# Patient Record
Sex: Female | Born: 1935
Health system: Southern US, Community
[De-identification: ages and names within clinical notes are randomized; demographics above are authoritative.]

## PROBLEM LIST (undated history)

## (undated) DIAGNOSIS — E039 Hypothyroidism, unspecified: Secondary | ICD-10-CM

## (undated) DIAGNOSIS — K219 Gastro-esophageal reflux disease without esophagitis: Secondary | ICD-10-CM

## (undated) DIAGNOSIS — R0600 Dyspnea, unspecified: Secondary | ICD-10-CM

## (undated) DIAGNOSIS — E785 Hyperlipidemia, unspecified: Secondary | ICD-10-CM

## (undated) DIAGNOSIS — R269 Unspecified abnormalities of gait and mobility: Secondary | ICD-10-CM

## (undated) DIAGNOSIS — F0390 Unspecified dementia without behavioral disturbance: Secondary | ICD-10-CM

## (undated) DIAGNOSIS — Z8711 Personal history of peptic ulcer disease: Secondary | ICD-10-CM

## (undated) DIAGNOSIS — M47819 Spondylosis without myelopathy or radiculopathy, site unspecified: Secondary | ICD-10-CM

## (undated) DIAGNOSIS — G4733 Obstructive sleep apnea (adult) (pediatric): Secondary | ICD-10-CM

## (undated) DIAGNOSIS — Z87442 Personal history of urinary calculi: Secondary | ICD-10-CM

## (undated) DIAGNOSIS — M353 Polymyalgia rheumatica: Secondary | ICD-10-CM

## (undated) DIAGNOSIS — M1990 Unspecified osteoarthritis, unspecified site: Secondary | ICD-10-CM

## (undated) DIAGNOSIS — R0609 Other forms of dyspnea: Secondary | ICD-10-CM

## (undated) DIAGNOSIS — Z9889 Other specified postprocedural states: Secondary | ICD-10-CM

## (undated) DIAGNOSIS — I452 Bifascicular block: Secondary | ICD-10-CM

## (undated) DIAGNOSIS — Z8673 Personal history of transient ischemic attack (TIA), and cerebral infarction without residual deficits: Secondary | ICD-10-CM

## (undated) DIAGNOSIS — I639 Cerebral infarction, unspecified: Secondary | ICD-10-CM

## (undated) DIAGNOSIS — Z8719 Personal history of other diseases of the digestive system: Secondary | ICD-10-CM

## (undated) DIAGNOSIS — N3941 Urge incontinence: Secondary | ICD-10-CM

## (undated) DIAGNOSIS — E119 Type 2 diabetes mellitus without complications: Secondary | ICD-10-CM

## (undated) DIAGNOSIS — Z859 Personal history of malignant neoplasm, unspecified: Secondary | ICD-10-CM

## (undated) DIAGNOSIS — I10 Essential (primary) hypertension: Secondary | ICD-10-CM

## (undated) HISTORY — DX: Hypothyroidism, unspecified: E03.9

## (undated) HISTORY — DX: Hyperlipidemia, unspecified: E78.5

## (undated) HISTORY — DX: Essential (primary) hypertension: I10

## (undated) HISTORY — PX: APPENDECTOMY: SHX54

## (undated) HISTORY — PX: TONSILLECTOMY: SUR1361

## (undated) HISTORY — DX: Obstructive sleep apnea (adult) (pediatric): G47.33

---
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
## 1997-05-06 ENCOUNTER — Ambulatory Visit (HOSPITAL_COMMUNITY): Admission: RE | Admit: 1997-05-06 | Discharge: 1997-05-06 | Payer: Self-pay | Admitting: Obstetrics and Gynecology

## 1997-07-29 ENCOUNTER — Other Ambulatory Visit: Admission: RE | Admit: 1997-07-29 | Discharge: 1997-07-29 | Payer: Self-pay | Admitting: Obstetrics and Gynecology

## 1998-06-20 ENCOUNTER — Ambulatory Visit (HOSPITAL_COMMUNITY): Admission: RE | Admit: 1998-06-20 | Discharge: 1998-06-20 | Payer: Self-pay | Admitting: Obstetrics and Gynecology

## 1998-08-22 ENCOUNTER — Other Ambulatory Visit: Admission: RE | Admit: 1998-08-22 | Discharge: 1998-08-22 | Payer: Self-pay | Admitting: Obstetrics and Gynecology

## 1998-12-15 ENCOUNTER — Encounter (HOSPITAL_BASED_OUTPATIENT_CLINIC_OR_DEPARTMENT_OTHER): Payer: Self-pay | Admitting: Internal Medicine

## 1998-12-15 ENCOUNTER — Encounter: Admission: RE | Admit: 1998-12-15 | Discharge: 1998-12-15 | Payer: Self-pay | Admitting: Internal Medicine

## 1999-07-03 ENCOUNTER — Encounter: Payer: Self-pay | Admitting: Obstetrics and Gynecology

## 1999-07-03 ENCOUNTER — Ambulatory Visit (HOSPITAL_COMMUNITY): Admission: RE | Admit: 1999-07-03 | Discharge: 1999-07-03 | Payer: Self-pay | Admitting: Obstetrics and Gynecology

## 1999-08-03 ENCOUNTER — Other Ambulatory Visit: Admission: RE | Admit: 1999-08-03 | Discharge: 1999-08-03 | Payer: Self-pay | Admitting: Obstetrics and Gynecology

## 2000-07-11 ENCOUNTER — Encounter: Payer: Self-pay | Admitting: Obstetrics and Gynecology

## 2000-07-11 ENCOUNTER — Ambulatory Visit (HOSPITAL_COMMUNITY): Admission: RE | Admit: 2000-07-11 | Discharge: 2000-07-11 | Payer: Self-pay | Admitting: Obstetrics and Gynecology

## 2000-08-22 ENCOUNTER — Other Ambulatory Visit: Admission: RE | Admit: 2000-08-22 | Discharge: 2000-08-22 | Payer: Self-pay | Admitting: Obstetrics and Gynecology

## 2001-09-07 ENCOUNTER — Encounter: Payer: Self-pay | Admitting: Obstetrics and Gynecology

## 2001-09-07 ENCOUNTER — Ambulatory Visit (HOSPITAL_COMMUNITY): Admission: RE | Admit: 2001-09-07 | Discharge: 2001-09-07 | Payer: Self-pay | Admitting: Obstetrics and Gynecology

## 2001-10-25 ENCOUNTER — Other Ambulatory Visit: Admission: RE | Admit: 2001-10-25 | Discharge: 2001-10-25 | Payer: Self-pay | Admitting: Obstetrics and Gynecology

## 2001-12-05 ENCOUNTER — Encounter: Admission: RE | Admit: 2001-12-05 | Discharge: 2001-12-05 | Payer: Self-pay | Admitting: Internal Medicine

## 2001-12-05 ENCOUNTER — Encounter: Payer: Self-pay | Admitting: Internal Medicine

## 2002-01-02 ENCOUNTER — Encounter: Payer: Self-pay | Admitting: Urology

## 2002-01-02 ENCOUNTER — Encounter: Admission: RE | Admit: 2002-01-02 | Discharge: 2002-01-02 | Payer: Self-pay | Admitting: Urology

## 2002-01-25 ENCOUNTER — Encounter: Payer: Self-pay | Admitting: Urology

## 2002-01-25 ENCOUNTER — Ambulatory Visit (HOSPITAL_BASED_OUTPATIENT_CLINIC_OR_DEPARTMENT_OTHER): Admission: RE | Admit: 2002-01-25 | Discharge: 2002-01-25 | Payer: Self-pay | Admitting: Urology

## 2002-02-08 ENCOUNTER — Encounter: Admission: RE | Admit: 2002-02-08 | Discharge: 2002-02-08 | Payer: Self-pay | Admitting: Urology

## 2002-02-08 ENCOUNTER — Encounter: Payer: Self-pay | Admitting: Urology

## 2002-05-10 ENCOUNTER — Ambulatory Visit (HOSPITAL_BASED_OUTPATIENT_CLINIC_OR_DEPARTMENT_OTHER): Admission: RE | Admit: 2002-05-10 | Discharge: 2002-05-10 | Payer: Self-pay | Admitting: Urology

## 2002-05-10 ENCOUNTER — Encounter: Payer: Self-pay | Admitting: Urology

## 2002-10-15 ENCOUNTER — Encounter: Payer: Self-pay | Admitting: Internal Medicine

## 2002-10-15 ENCOUNTER — Ambulatory Visit (HOSPITAL_COMMUNITY): Admission: RE | Admit: 2002-10-15 | Discharge: 2002-10-15 | Payer: Self-pay | Admitting: Internal Medicine

## 2002-12-26 ENCOUNTER — Other Ambulatory Visit: Admission: RE | Admit: 2002-12-26 | Discharge: 2002-12-26 | Payer: Self-pay | Admitting: Obstetrics and Gynecology

## 2003-01-09 ENCOUNTER — Ambulatory Visit (HOSPITAL_BASED_OUTPATIENT_CLINIC_OR_DEPARTMENT_OTHER): Admission: RE | Admit: 2003-01-09 | Discharge: 2003-01-09 | Payer: Self-pay | Admitting: Orthopedic Surgery

## 2003-01-09 HISTORY — PX: OTHER SURGICAL HISTORY: SHX169

## 2003-09-11 ENCOUNTER — Encounter: Admission: RE | Admit: 2003-09-11 | Discharge: 2003-09-11 | Payer: Self-pay | Admitting: Rheumatology

## 2003-09-15 ENCOUNTER — Emergency Department (HOSPITAL_COMMUNITY): Admission: EM | Admit: 2003-09-15 | Discharge: 2003-09-15 | Payer: Self-pay | Admitting: Emergency Medicine

## 2003-09-17 ENCOUNTER — Encounter: Admission: RE | Admit: 2003-09-17 | Discharge: 2003-09-17 | Payer: Self-pay | Admitting: Rheumatology

## 2003-12-27 ENCOUNTER — Encounter: Admission: RE | Admit: 2003-12-27 | Discharge: 2003-12-27 | Payer: Self-pay | Admitting: Orthopedic Surgery

## 2004-01-08 ENCOUNTER — Ambulatory Visit (HOSPITAL_COMMUNITY): Admission: RE | Admit: 2004-01-08 | Discharge: 2004-01-08 | Payer: Self-pay | Admitting: Obstetrics and Gynecology

## 2004-02-28 ENCOUNTER — Emergency Department (HOSPITAL_COMMUNITY): Admission: EM | Admit: 2004-02-28 | Discharge: 2004-02-28 | Payer: Self-pay | Admitting: Emergency Medicine

## 2004-03-04 ENCOUNTER — Ambulatory Visit (HOSPITAL_COMMUNITY): Admission: EM | Admit: 2004-03-04 | Discharge: 2004-03-05 | Payer: Self-pay | Admitting: *Deleted

## 2004-03-04 HISTORY — PX: OTHER SURGICAL HISTORY: SHX169

## 2004-03-19 ENCOUNTER — Emergency Department (HOSPITAL_COMMUNITY): Admission: EM | Admit: 2004-03-19 | Discharge: 2004-03-19 | Payer: Self-pay | Admitting: Emergency Medicine

## 2004-04-06 ENCOUNTER — Other Ambulatory Visit: Admission: RE | Admit: 2004-04-06 | Discharge: 2004-04-06 | Payer: Self-pay | Admitting: Obstetrics and Gynecology

## 2004-08-25 ENCOUNTER — Emergency Department (HOSPITAL_COMMUNITY): Admission: EM | Admit: 2004-08-25 | Discharge: 2004-08-25 | Payer: Self-pay | Admitting: Emergency Medicine

## 2004-11-12 ENCOUNTER — Inpatient Hospital Stay (HOSPITAL_COMMUNITY): Admission: RE | Admit: 2004-11-12 | Discharge: 2004-11-16 | Payer: Self-pay | Admitting: Orthopedic Surgery

## 2004-11-12 ENCOUNTER — Ambulatory Visit: Payer: Self-pay | Admitting: Physical Medicine & Rehabilitation

## 2004-11-12 HISTORY — PX: TOTAL KNEE ARTHROPLASTY: SHX125

## 2004-11-16 ENCOUNTER — Inpatient Hospital Stay
Admission: RE | Admit: 2004-11-16 | Discharge: 2004-11-24 | Payer: Self-pay | Admitting: Physical Medicine & Rehabilitation

## 2004-12-15 ENCOUNTER — Ambulatory Visit: Admission: RE | Admit: 2004-12-15 | Discharge: 2004-12-15 | Payer: Self-pay | Admitting: Orthopedic Surgery

## 2005-05-03 ENCOUNTER — Other Ambulatory Visit: Admission: RE | Admit: 2005-05-03 | Discharge: 2005-05-03 | Payer: Self-pay | Admitting: Obstetrics and Gynecology

## 2005-07-22 ENCOUNTER — Ambulatory Visit (HOSPITAL_COMMUNITY): Admission: RE | Admit: 2005-07-22 | Discharge: 2005-07-22 | Payer: Self-pay | Admitting: Obstetrics and Gynecology

## 2006-07-29 ENCOUNTER — Ambulatory Visit (HOSPITAL_COMMUNITY): Admission: RE | Admit: 2006-07-29 | Discharge: 2006-07-29 | Payer: Self-pay | Admitting: Obstetrics and Gynecology

## 2007-08-30 ENCOUNTER — Ambulatory Visit (HOSPITAL_COMMUNITY): Admission: RE | Admit: 2007-08-30 | Discharge: 2007-08-30 | Payer: Self-pay | Admitting: Internal Medicine

## 2008-10-18 ENCOUNTER — Encounter: Admission: RE | Admit: 2008-10-18 | Discharge: 2008-10-18 | Payer: Self-pay | Admitting: Internal Medicine

## 2008-11-18 ENCOUNTER — Inpatient Hospital Stay (HOSPITAL_COMMUNITY): Admission: EM | Admit: 2008-11-18 | Discharge: 2008-11-19 | Payer: Self-pay | Admitting: Emergency Medicine

## 2009-02-03 ENCOUNTER — Ambulatory Visit (HOSPITAL_COMMUNITY): Admission: RE | Admit: 2009-02-03 | Discharge: 2009-02-03 | Payer: Self-pay | Admitting: Internal Medicine

## 2009-02-10 ENCOUNTER — Encounter: Admission: RE | Admit: 2009-02-10 | Discharge: 2009-02-10 | Payer: Self-pay | Admitting: Internal Medicine

## 2009-02-28 ENCOUNTER — Inpatient Hospital Stay (HOSPITAL_COMMUNITY): Admission: AD | Admit: 2009-02-28 | Discharge: 2009-03-02 | Payer: Self-pay | Admitting: Internal Medicine

## 2009-03-01 ENCOUNTER — Encounter (INDEPENDENT_AMBULATORY_CARE_PROVIDER_SITE_OTHER): Payer: Self-pay | Admitting: Internal Medicine

## 2009-03-23 ENCOUNTER — Emergency Department (HOSPITAL_COMMUNITY): Admission: EM | Admit: 2009-03-23 | Discharge: 2009-03-24 | Payer: Self-pay | Admitting: Emergency Medicine

## 2009-03-27 ENCOUNTER — Emergency Department (HOSPITAL_COMMUNITY): Admission: EM | Admit: 2009-03-27 | Discharge: 2009-03-27 | Payer: Self-pay | Admitting: Emergency Medicine

## 2009-06-09 ENCOUNTER — Emergency Department (HOSPITAL_COMMUNITY): Admission: EM | Admit: 2009-06-09 | Discharge: 2009-06-09 | Payer: Self-pay | Admitting: Emergency Medicine

## 2009-06-27 ENCOUNTER — Encounter: Admission: RE | Admit: 2009-06-27 | Discharge: 2009-09-25 | Payer: Self-pay | Admitting: Internal Medicine

## 2009-09-25 ENCOUNTER — Emergency Department (HOSPITAL_COMMUNITY)
Admission: EM | Admit: 2009-09-25 | Discharge: 2009-09-26 | Payer: Self-pay | Source: Home / Self Care | Admitting: Emergency Medicine

## 2009-10-09 ENCOUNTER — Ambulatory Visit: Payer: Self-pay | Admitting: Surgery

## 2009-10-20 ENCOUNTER — Ambulatory Visit: Payer: Self-pay | Admitting: Cardiology

## 2009-11-04 ENCOUNTER — Encounter
Admission: RE | Admit: 2009-11-04 | Discharge: 2010-02-02 | Payer: Self-pay | Source: Home / Self Care | Attending: Internal Medicine | Admitting: Internal Medicine

## 2010-03-03 ENCOUNTER — Encounter
Admission: RE | Admit: 2010-03-03 | Discharge: 2010-03-24 | Payer: Self-pay | Source: Home / Self Care | Attending: Internal Medicine | Admitting: Internal Medicine

## 2010-04-17 ENCOUNTER — Other Ambulatory Visit: Payer: Self-pay | Admitting: Orthopedic Surgery

## 2010-04-17 DIAGNOSIS — N2889 Other specified disorders of kidney and ureter: Secondary | ICD-10-CM

## 2010-04-20 ENCOUNTER — Ambulatory Visit
Admission: RE | Admit: 2010-04-20 | Discharge: 2010-04-20 | Disposition: A | Discharge status: Home / Self Care | Payer: Medicare Other | Source: Ambulatory Visit | Attending: Orthopedic Surgery | Admitting: Orthopedic Surgery

## 2010-04-20 DIAGNOSIS — N2889 Other specified disorders of kidney and ureter: Secondary | ICD-10-CM

## 2010-04-28 ENCOUNTER — Other Ambulatory Visit (HOSPITAL_COMMUNITY): Payer: Self-pay | Admitting: Urology

## 2010-04-28 ENCOUNTER — Ambulatory Visit (HOSPITAL_COMMUNITY)
Admission: RE | Admit: 2010-04-28 | Discharge: 2010-04-28 | Disposition: A | Discharge status: Home / Self Care | Payer: Medicare Other | Source: Ambulatory Visit | Attending: Urology | Admitting: Urology

## 2010-04-28 DIAGNOSIS — D49519 Neoplasm of unspecified behavior of unspecified kidney: Secondary | ICD-10-CM

## 2010-04-28 DIAGNOSIS — D4959 Neoplasm of unspecified behavior of other genitourinary organ: Secondary | ICD-10-CM | POA: Insufficient documentation

## 2010-05-10 LAB — COMPREHENSIVE METABOLIC PANEL
ALT: 21 U/L (ref 0–35)
Albumin: 3.4 g/dL — ABNORMAL LOW (ref 3.5–5.2)
BUN: 20 mg/dL (ref 6–23)
Calcium: 8.3 mg/dL — ABNORMAL LOW (ref 8.4–10.5)
Calcium: 8.5 mg/dL (ref 8.4–10.5)
Creatinine, Ser: 1.29 mg/dL — ABNORMAL HIGH (ref 0.4–1.2)
Creatinine, Ser: 1.36 mg/dL — ABNORMAL HIGH (ref 0.4–1.2)
GFR calc non Af Amer: 41 mL/min — ABNORMAL LOW (ref >60)
Glucose, Bld: 112 mg/dL — ABNORMAL HIGH (ref 70–99)
Sodium: 138 mEq/L (ref 135–145)
Total Protein: 5.8 g/dL — ABNORMAL LOW (ref 6.0–8.3)
Total Protein: 6.1 g/dL (ref 6.0–8.3)

## 2010-05-10 LAB — TYPE AND SCREEN
ABO/RH(D): O POS
Antibody Screen: NEGATIVE

## 2010-05-10 LAB — CBC
HCT: 24.6 % — ABNORMAL LOW (ref 36.0–46.0)
HCT: 36.3 % (ref 36.0–46.0)
Hemoglobin: 10.6 g/dL — ABNORMAL LOW (ref 12.0–15.0)
Hemoglobin: 12.5 g/dL (ref 12.0–15.0)
MCHC: 34.8 g/dL (ref 30.0–36.0)
MCV: 95.9 fL (ref 78.0–100.0)
MCV: 99.3 fL (ref 78.0–100.0)
Platelets: 220 10*3/uL (ref 150–400)
Platelets: 266 10*3/uL (ref 150–400)
RBC: 3.84 MIL/uL — ABNORMAL LOW (ref 3.87–5.11)
RDW: 15.5 % (ref 11.5–15.5)
RDW: 15.6 % — ABNORMAL HIGH (ref 11.5–15.5)
RDW: 17.3 % — ABNORMAL HIGH (ref 11.5–15.5)
RDW: 15.2 % (ref 11.5–15.5)
WBC: 5.8 10*3/uL (ref 4.0–10.5)
WBC: 6.9 10*3/uL (ref 4.0–10.5)
WBC: 9.2 10*3/uL (ref 4.0–10.5)

## 2010-05-10 LAB — POCT I-STAT, CHEM 8
BUN: 22 mg/dL (ref 6–23)
Calcium, Ion: 1.16 mmol/L (ref 1.12–1.32)
Chloride: 100 mEq/L (ref 96–112)
Creatinine, Ser: 1.2 mg/dL (ref 0.4–1.2)
Glucose, Bld: 179 mg/dL — ABNORMAL HIGH (ref 70–99)
TCO2: 33 mmol/L (ref 0–100)

## 2010-05-10 LAB — DIFFERENTIAL
Eosinophils Relative: 1 % (ref 0–5)
Lymphocytes Relative: 19 % (ref 12–46)
Lymphs Abs: 1.8 10*3/uL (ref 0.7–4.0)
Monocytes Absolute: 1.1 10*3/uL — ABNORMAL HIGH (ref 0.1–1.0)
Monocytes Relative: 12 % (ref 3–12)
Neutro Abs: 6.2 10*3/uL (ref 1.7–7.7)

## 2010-05-10 LAB — PROTIME-INR
INR: 0.94 (ref 0.00–1.49)
Prothrombin Time: 12.5 seconds (ref 11.6–15.2)

## 2010-05-10 LAB — GLUCOSE, CAPILLARY
Glucose-Capillary: 112 mg/dL — ABNORMAL HIGH (ref 70–99)
Glucose-Capillary: 145 mg/dL — ABNORMAL HIGH (ref 70–99)
Glucose-Capillary: 78 mg/dL (ref 70–99)
Glucose-Capillary: 85 mg/dL (ref 70–99)
Glucose-Capillary: 88 mg/dL (ref 70–99)
Glucose-Capillary: 89 mg/dL (ref 70–99)

## 2010-05-12 LAB — POCT I-STAT, CHEM 8
BUN: 26 mg/dL — ABNORMAL HIGH (ref 6–23)
Creatinine, Ser: 1.3 mg/dL — ABNORMAL HIGH (ref 0.4–1.2)
Glucose, Bld: 99 mg/dL (ref 70–99)
Hemoglobin: 15 g/dL (ref 12.0–15.0)
Sodium: 138 mEq/L (ref 135–145)
TCO2: 28 mmol/L (ref 0–100)

## 2010-05-13 LAB — POCT CARDIAC MARKERS
CKMB, poc: 1 ng/mL (ref 1.0–8.0)
Myoglobin, poc: 64.9 ng/mL (ref 12–200)
Troponin i, poc: <0.05 ng/mL (ref 0.00–0.09)

## 2010-05-13 LAB — URINALYSIS, ROUTINE W REFLEX MICROSCOPIC
Bilirubin Urine: NEGATIVE
Glucose, UA: NEGATIVE mg/dL
Hgb urine dipstick: NEGATIVE
Protein, ur: 30 mg/dL — AB
Urobilinogen, UA: 1 mg/dL (ref 0.0–1.0)

## 2010-05-13 LAB — URINE MICROSCOPIC-ADD ON

## 2010-05-13 LAB — BASIC METABOLIC PANEL
CO2: 27 mEq/L (ref 19–32)
Calcium: 8.7 mg/dL (ref 8.4–10.5)
Chloride: 101 mEq/L (ref 96–112)
Creatinine, Ser: 0.94 mg/dL (ref 0.4–1.2)
GFR calc Af Amer: >60 mL/min (ref >60)
Sodium: 137 mEq/L (ref 135–145)

## 2010-05-13 LAB — DIFFERENTIAL
Lymphocytes Relative: 15 % (ref 12–46)
Lymphs Abs: 1.3 10*3/uL (ref 0.7–4.0)
Monocytes Absolute: 0.6 10*3/uL (ref 0.1–1.0)
Monocytes Relative: 8 % (ref 3–12)
Neutro Abs: 6.5 10*3/uL (ref 1.7–7.7)
Neutrophils Relative %: 77 % (ref 43–77)

## 2010-05-13 LAB — PROTIME-INR: INR: 0.98 (ref 0.00–1.49)

## 2010-05-13 LAB — CBC
Hemoglobin: 12.6 g/dL (ref 12.0–15.0)
RBC: 3.92 MIL/uL (ref 3.87–5.11)
WBC: 8.5 10*3/uL (ref 4.0–10.5)

## 2010-05-29 LAB — COMPREHENSIVE METABOLIC PANEL
AST: 34 U/L (ref 0–37)
Albumin: 3.3 g/dL — ABNORMAL LOW (ref 3.5–5.2)
Alkaline Phosphatase: 56 U/L (ref 39–117)
BUN: 25 mg/dL — ABNORMAL HIGH (ref 6–23)
BUN: 34 mg/dL — ABNORMAL HIGH (ref 6–23)
CO2: 26 mEq/L (ref 19–32)
Calcium: 7.9 mg/dL — ABNORMAL LOW (ref 8.4–10.5)
Chloride: 100 mEq/L (ref 96–112)
Creatinine, Ser: 1.62 mg/dL — ABNORMAL HIGH (ref 0.4–1.2)
GFR calc Af Amer: 27 mL/min — ABNORMAL LOW (ref >60)
GFR calc non Af Amer: 31 mL/min — ABNORMAL LOW (ref >60)
Glucose, Bld: 102 mg/dL — ABNORMAL HIGH (ref 70–99)
Potassium: 4.7 mEq/L (ref 3.5–5.1)
Total Bilirubin: 1 mg/dL (ref 0.3–1.2)
Total Protein: 7.2 g/dL (ref 6.0–8.3)

## 2010-05-29 LAB — URINE MICROSCOPIC-ADD ON

## 2010-05-29 LAB — CULTURE, BLOOD (SINGLE): Culture: NO GROWTH

## 2010-05-29 LAB — CBC
HCT: 32.2 % — ABNORMAL LOW (ref 36.0–46.0)
HCT: 36.4 % (ref 36.0–46.0)
MCHC: 34.4 g/dL (ref 30.0–36.0)
MCV: 94.9 fL (ref 78.0–100.0)
Platelets: 281 10*3/uL (ref 150–400)
RDW: 14.7 % (ref 11.5–15.5)
WBC: 6.4 10*3/uL (ref 4.0–10.5)
WBC: 8.1 10*3/uL (ref 4.0–10.5)

## 2010-05-29 LAB — URINE CULTURE: Colony Count: >=100000

## 2010-05-29 LAB — DIFFERENTIAL
Eosinophils Relative: 1 % (ref 0–5)
Lymphocytes Relative: 15 % (ref 12–46)
Monocytes Absolute: 0.7 10*3/uL (ref 0.1–1.0)
Monocytes Relative: 8 % (ref 3–12)
Neutro Abs: 6.1 10*3/uL (ref 1.7–7.7)

## 2010-05-29 LAB — URINALYSIS, ROUTINE W REFLEX MICROSCOPIC
Nitrite: NEGATIVE
Protein, ur: NEGATIVE mg/dL
Urobilinogen, UA: 1 mg/dL (ref 0.0–1.0)

## 2010-05-29 LAB — GLUCOSE, CAPILLARY: Glucose-Capillary: 127 mg/dL — ABNORMAL HIGH (ref 70–99)

## 2010-06-02 ENCOUNTER — Encounter: Payer: Medicare Other | Attending: Internal Medicine | Admitting: Dietician

## 2010-06-02 ENCOUNTER — Encounter: Admit: 2010-06-02 | Payer: Self-pay | Admitting: Internal Medicine

## 2010-06-02 DIAGNOSIS — Z713 Dietary counseling and surveillance: Secondary | ICD-10-CM | POA: Insufficient documentation

## 2010-06-02 DIAGNOSIS — E119 Type 2 diabetes mellitus without complications: Secondary | ICD-10-CM | POA: Insufficient documentation

## 2010-06-11 ENCOUNTER — Telehealth: Payer: Self-pay | Admitting: Cardiology

## 2010-06-11 NOTE — Telephone Encounter (Signed)
Needs a refill of crestor. Says that she just comes by and picks it up from the office. Please call back. I have pulled her chart.

## 2010-06-12 NOTE — Telephone Encounter (Signed)
Called requesting samples of Crestor 10 mg. Have some available. She will come by to pick up.

## 2010-07-07 NOTE — Procedures (Signed)
DUPLEX DEEP VENOUS EXAM - LOWER EXTREMITY   INDICATION:  Pain in limb, fall with large hematoma.   HISTORY:  Edema:  Right lower extremity over area of injury.  Trauma/Surgery:  Larey Seat off curb 1 week ago and injured her anterior right  knee/shin area.  Pain:  Right lower extremity at injury site.  PE:  No.  Previous DVT:  No.  Anticoagulants:  Other:   DUPLEX EXAM:                CFV   SFV   PopV  PTV    GSV                R  L  R  L  R  L  R   L  R  L  Thrombosis    o  o  o     o     o      o  Spontaneous   +  +  +     +     +      +  Phasic        +  +  +     +     +      +  Augmentation  +  +  +     +     +      +  Compressible  +  +  +     +     +      +  Competent     +  +  +     +     +      +   Legend:  + - yes  o - no  p - partial  D - decreased   IMPRESSION:  1. No evidence of deep or superficial vein thrombosis noted in the      right lower extremity, based on limited visualization.  The right      proximal calf veins were not adequately visualized due to leg      swelling.  2. There is a large cystic structure, with internal debris and no      internal flow, noted extending from the right lateral knee to the      anterior lateral proximal shin level.    _____________________________  V. Charlena Cross, MD   CH/MEDQ  D:  10/10/2009  T:  10/10/2009  Job:  [id number]

## 2010-07-10 NOTE — Discharge Summary (Signed)
[name], [name]   MEDICAL RECORD NO.:  [id number]          PATIENT TYPE:  ORB   LOCATION:  4505                         FACILITY:  [facility]   PHYSICIAN:  [name], M.D.   DATE OF BIRTH:  Aug 03, 1934   DATE OF ADMISSION:  11/16/2004  DATE OF DISCHARGE:  11/24/2004                                 DISCHARGE SUMMARY   DISCHARGE DIAGNOSIS:  1.  Left total knee replacement for osteoarthritis.  2.  Diabetes mellitus.  3.  Dyslipidemia.  4.  Anemia.  5.  Obstructive sleep apnea.   HISTORY OF PRESENT ILLNESS:  [name] is a 76 year old female with a  history of PMR, DJD right knee, with pain times one year secondary to OA,  she elected to undergo right total knee replacement September 21 by Dr.  Charlann Boxer.  Postop is weight-bearing as tolerated, on Coumadin for DVT  prophylaxis with INR 2 today.  She is noted to have postop anemia with last  hemoglobin 10.5.  She is also noted to have hypoxia secondary to a history  of OSA but has not liked using CPAP secondary to using of mask.  She is  currently at supervision for ambulating short distances.  Subacute was  consulted for further therapy.   PAST MEDICAL HISTORY:  Significant for multiple renal calculi, partial right  staghorn treated with lithotripsy in the past, PMR, right rotator cuff  repair in 2004, right wrist ORIF in January 2006, hypothyroidism, right  shoulder impingement syndrome, OSA, T&A, right inguinal hernia repair, DM  type 2.   ALLERGIES:  Morphine.   FAMILY HISTORY:  Positive for coronary artery disease and CVA.   SOCIAL HISTORY:  The patient is widowed, was independent and working in  [employer] prior to a fall two months go at which time she had been wheelchair  bound.  She quit tobacco in 2002, uses alcohol occasionally.   HOSPITAL COURSE:  [name] was admitted to subacute November 16, 2004, for SACU level therapies to consist of PT and OT daily.  Past  admission,  the patient was maintained Coumadin for DVT prophylaxis and is to  continue on this through October 21 to complete her DVT prophylaxis course.  Pain control was reasonable with scheduled OxyContin CR on a b.i.d. basis  with p.r.n. use of Oxy-IR and Robaxin.  Diabetes was monitored on an a.c.  and h.s. basis and blood sugars showed good control ranging from 80 to 130  on Actos 20 mg a day and Amaryl 1 mg daily.  P.o. intake has been good.  The  patient has been continent of bowel and bladder.  Check of labs past anemia  showed postop anemia to continue with H&H of 9.2 and 27.  The patient  remains on iron supplements currently.  Check of electrolytes showed mild  hyponatremia with sodium 132, potassium 3.4, chloride 92, CO2 32, BUN 9,  creatinine 0.9, glucose 135.  The patient was weaned off O2 at the time of  admission.  She does continue with some hypoxia with saturations at 88%  especially at night.  O2 saturation on room air is at 93% during the day.  The patient is instructed to follow up regarding the use of CPAP or BiPAP to  help with oxygenation during the day.  The patient's knee has been monitored  along, this has been healing well without any signs or symptoms of  infection.  During  her stay in subacute, the patient progressed to being at  modified independent level for ambulating 150 feet with rolling walker, knee  flexion is currently at 93 degrees.  Further follow up therapies to include  home  health PT and OT by [facility] past discharge.  The next  protime to be drawn on October 5.  On November 24, 2004, the patient is  discharged to home.   DISCHARGE MEDICATIONS:  Welchol 375 mg daily, Actos 30 mg daily, OxyContin  CR 10 mg q.12h. x one week then decrease to daily x one week then  discontinue, Amaryl 1 per day, Protonix 40 mg daily, Levothyroxine 100 mcg  daily, Zoloft 150 mg daily, Coumadin 2 mg 1 1/2 p.o. q.p.m., Robaxin 500 mg  q.i.d. p.r.n. spasms, Oxycodone IR  5-10 mg q.4-6h. p.r.n. pain.   DISCHARGE INSTRUCTIONS:  Activities:  As tolerated with the use of walker.  No driving for 4-6 weeks.  Diet is diabetic.  Wound care:  Wash are with  antibacterial soap and water, keep clean and dry.  The patient is to follow  up with Dr. Lequita Halt in 1-2 weeks, follow up with Dr. Thomasena Edis as needed.      Greg Cutter, P.A.    ______________________________  [name], M.D.    PP/MEDQ  D:  11/24/2004  T:  11/24/2004  Job:  [id number]   cc:   Gwen Pounds, MD  Fax: [phone]   Madlyn Frankel. Charlann Boxer, M.D.  Fax: [phone]

## 2010-07-10 NOTE — Op Note (Signed)
NAMEMONICIA, TSE NO.:  [id number]   MEDICAL RECORD NO.:  [id number]          PATIENT TYPE:  OIB   LOCATION:  2861                         FACILITY:  [facility]   PHYSICIAN:  Tennis Must Meyerdierks, M.D.DATE OF BIRTH:  21-Jun-1934   DATE OF PROCEDURE:  03/04/2004  DATE OF DISCHARGE:                                 OPERATIVE REPORT   PREOPERATIVE DIAGNOSIS:  Comminuted right distal radius fracture.   POSTOPERATIVE DIAGNOSIS:  Comminuted right distal radius fracture.   PROCEDURE:  Open reduction internal fixation, right distal radius.   SURGEON:  Lowell Bouton, M.D.   ANESTHESIA:  General.   OPERATIVE FINDINGS:  The patient had a comminuted fracture that was through  the distal metaphysis. Her bone quality was quite poor. She does take  Fosamax.   PROCEDURE:  Under general anesthesia with a tourniquet on the right arm, the  right hand was prepped and draped in usual fashion after exsanguinating the  limb, the tourniquet was inflated to 250 mmHg. 10 pounds of traction was  applied across the index, middle and ring fingers with finger traps and  traction over the end of the bed.  A longitudinal incision was made in line  with the FCR tendon and then extended obliquely across the wrist crease.  Sharp dissection was carried through the subcutaneous tissues and bleeding  points were coagulated. There was significant bleeding at this point and it  was breakthrough bleeding so the fingers were removed from the finger traps,  tourniquet was released and the limb was re-exsanguinated. The tourniquet  was then inflated to 270 mmHg. The traction was then reapplied and blunt  dissection was carried down through the subcutaneous tissues to the FCR  tendon. The FCR sheath was opened longitudinally and the tendon was  retracted ulnarly. The floor of the sheath was incised with the scissors.  Blunt dissection was carried down radial to the FPL tendon between the  radial artery and the FPL tendon. The pronator quadratus was identified and  was sharply dissected longitudinally through near the radial attachment. The  pronator was then elevated ulnarly and this exposed the distal radius. The  fracture site was identified and there was good distraction with the  traction. A short right DVR plate was then applied and the sliding hole  screw was inserted using a 3.5 mm screw. X-rays showed good position of the  plate and it was adjusted appropriately. The four pegs were then inserted  distally after releasing the traction. X-ray showed good position of the  fracture and good length. The remaining 3.5 mm screws were placed proximally  and then two more pegs were placed parallel to the joint distally. This  allowed for good fixation and good position of the fracture under  fluoroscopy.  There was no motion. X-rays were taken. The wound was then  irrigated with saline. A TLS drain was inserted. The pronator quadratus was  repaired with a 4-0 Vicryl. Subcutaneous tissue was closed with 4-0 Vicryl.  The skin was closed with the 3-0 subcuticular Prolene. Steri-Strips were  applied. 0.5% Marcaine was  inserted in the skin edges for pain control. The  dressings were then applied followed by a volar wrist splint. The patient  tolerated the procedure well and went to recovery room awake and stable in  good condition.      EMM/MEDQ  D:  03/04/2004  T:  03/04/2004  Job:  [id number]

## 2010-07-10 NOTE — Op Note (Signed)
[name], [name]                ACCOUNT NO.:  [id number]   MEDICAL RECORD NO.:  [id number]          PATIENT TYPE:  INP   LOCATION:  1514                         [facility]ACILITY:  [facility]   PHYSICIAN:  Madlyn Frankel. Charlann Boxer, M.D.  DATE OF BIRTH:  06-04-1934   DATE OF PROCEDURE:  11/12/2004  DATE OF DISCHARGE:                                 OPERATIVE REPORT   PREOPERATIVE DIAGNOSIS:  Left knee osteoarthritis.   POSTOPERATIVE DIAGNOSIS:  Left knee osteoarthritis.   PROCEDURE:  Left total knee replacement.   COMPONENTS USED:  DePuy rotating platform posterior stabilized knee system  with a size 3 femur, size 3 tibia with a 12.5 mm x size 3 posterior  stabilized rotating platform polyethylene insert, 32 mm patella button.   SURGEON:  Dr. Charlann Boxer   ASSISTANT:  Dr. Worthy Rancher   ANESTHESIA:  Spinal.   DRAINS:  One.   SPECIMENS:  None.   BLOOD LOSS:  Minimal.   COMPLICATIONS:  None.   TOURNIQUET TIME:  70 minutes at 250 mmHg.   INDICATION FOR PROCEDURE:  Ms. Rodier is a 76 year old female, who presented  to the office for evaluation of end-stage left knee osteoarthritis.  We had  treated her conservatively.  She has had multiple episodes with the knee  giving out on her due to discomfort.  After this happened one last time, she  decided that it was time for her to undergo knee replacement.  She was  cleared medically and was prepared for surgery.  Risks and benefits were  reviewed including infection, component failure, need for revision, DVT, and  medical complications.  Consent was obtained.   PROCEDURE IN DETAIL:  The patient was brought to the operative theatre.  Once adequate anesthesia and preoperative antibiotics, 1 g of Ancef, were  administered, the patient was positioned supine and a proximal thigh  tourniquet placed on the left lower extremity.  The left lower extremity was  then prepped and draped in a sterile fashion.  A midline incision was made  with the knee flexed, and  the knee was then exposed with soft tissue flaps.  A modified medial parapatellar arthrotomy was then made and patella was  subluxated laterally and not everted.  Following knee exposure, including  partial medial meniscectomy and fat pad debridement off the inferior patella  region, the femoral canal was entered.  Intramedullary guide was placed, and  a 10 mm resection was made off of the distal femur utilizing a 5-degree  valgus cut.  Following this, I sized the femur.  I noted at this point,  there was somewhat of a discrepancy.  She had a rather tall femur in an  anterior-posterior direction.  However, medial laterally was relatively  small.  The height of her lateral condyle somewhat skewed the initial  measurement from the posterior referencing standpoint and determined it was  a size 5 femur.  With this, I did initially cut with a size 5 femur in order  to prevent any notching.  However, as the procedure was carried out, I felt  that initially the size 4 would  work out but then ended up backing down to a  size 3 once I had trialed the femur and felt there was too much overhand  laterally.  Please note that even with a size 3 block, there was no notching  of the anterior femoral cortex.  The size 3 component trial was carried out,  fit perfectly on her femur.   Following the sizing, as noted, I had a slight bit of external rotation  based on the posterior condylar reference, based on Whitesides line.  This  was also in reference to the epicondylar axis, but it felt that it needed a  slight bit more external rotation.  The anterior, posterior, and chamfer  cuts were made and revisited on each time that I backed down to a different  size component.   I did make the box cut based off the lateral aspect of the medial femoral  condyle, then again trialed my components which had been a size 3.   Tibial exposure was obtained with subluxation of the tibia.  Further  debridement was  carried out as necessary.  I then, with an extramedullary  device perpendicular to the shaft, resected 10 mm off the lateral tibia.  This amounted to basically a zero cut on the very denuded medial aspect of  the joint.  I did revisit this area to make sure that I was cutting bone as  opposed to some kind of sclerosis.  This area was drilled over the medial  aspect of the tibial plateau given the sclerotic nature of the bone.  The  tibia sized to be a size 3.  I then trialed with a size 10 poly and felt the  knee came up in excellent extension with perhaps a slight bit of  hyperextension.   Final tibial preparation was then carried out for RP system with drilling  and keel punching based off the rotation of a trial component.  With the  trial components in position, the patella was cut.  Precut measurement was  22 mm.  I cut it down to 14 mm, then placed a 32 patella button.  The  patella tracked without application of any pressure.   At this point, trial components were removed.  Final components were brought  to the field.  The knee was copiously irrigated with a pulse lavage normal  saline solution.  Following this, the knee was ejected into the  suprapatellar pouch and into the pericapsular tissues with a combination of  60 mL of 0.25% Marcaine, 0.5 mL of 1:100,000 epinephrine, and 1 mL of 30 mg  of Toradol.  Once this was carried out, cement was prepared on the back  table.   Please note I did add 1 g of tobramycin per bag of cement given the fact  that she is on methotrexate.  Once the cement was prepared, the final  components were cemented into position with the tibia, femur, and then  patella.  I initially placed a 10 mm cruciate retaining spacer to hold the  knee in extension.  The tourniquet was let down once the clamp was set.  There was very little oozing at this point.  Once the cement had cured, excessive cement was removed off the posterior aspect of the knee and  around  the femur and the tibia.  I then trialed a 12.5 poly and felt that the knee  came out into full extension without any evidence of any bounce into  hyperextension and was  stable in flexion.  Again, the patella tracked  without any application of pressure.  Given this, the final 12.5 posterior  stabilized size 3 rotating platform poly was then inserted.  Copious  irrigation was carried out.  The medium Hemovac drain was placed.  The  extensor mechanism was then reapproximated with the knee in flexion using #1  PDS.  The remainder of the wound was closed in layers with a 4-0 Monocryl  subcutaneously and Dermabond on the skin.  Note, that I did give another gram of Ancef at the end of the case, again,  because of prophylaxis due to her use of methotrexate and the possibility of  __________.  At this point, her knee was dressed sterilely and with a bulky  dry dressing, she was transferred to the recovery room in stable condition.      Madlyn Frankel Charlann Boxer, M.D.  Electronically Signed     MDO/MEDQ  D:  11/12/2004  T:  11/13/2004  Job:  [id number]

## 2010-07-10 NOTE — Discharge Summary (Signed)
[name], SPIKES                ACCOUNT NO.:  [id number]   MEDICAL RECORD NO.:  [id number]          PATIENT TYPE:  INP   LOCATION:  1514                         [facility]ACILITY:  [facility]   PHYSICIAN:  Madlyn Frankel. Charlann Boxer, M.D.  DATE OF BIRTH:  30-May-1934   DATE OF ADMISSION:  11/12/2004  DATE OF DISCHARGE:  11/16/2004                                 DISCHARGE SUMMARY   ADMITTING DIAGNOSES:  1.  Osteoarthritis of the left knee.  2.  History of renal calculi.  3.  Hypertension.  4.  Diabetes.  5.  Reflux disease.  6.  Polymyalgia rheumatica.  7.  Cervical degenerative disease.  8.  Lumbar degenerative disease.  9.  Right shoulder impingement syndrome.  10. Hypothyroidism.   DISCHARGE DIAGNOSES:  1.  Osteoarthritis of the left knee.  2.  History of renal calculi.  3.  Hypertension.  4.  Diabetes.  5.  Reflux disease.  6.  Polymyalgia rheumatica.  7.  Cervical degenerative disease.  8.  Lumbar degenerative disease.  9.  Right shoulder impingement syndrome.  10. Hypothyroidism.  11. Mild postoperative anemia.   CONSULTS:  Creola Corn, internal medicine.   OPERATION:  On November 12, 2004, the patient underwent left total knee  replacement arthroplasty utilizing DePuy rotating platform posterior  stabilizing knee system with all three components cemented.  Dr. Worthy Rancher  assisted.   BRIEF HISTORY:  This 76 year old lady with end-stage left knee  osteoarthritis presented to the office for increasing pain and difficulty  getting about.  She has multiple episodes of the knee giving out and her  near falling due to her discomfort.  After failure of conservative treatment  and the fact that she is quite fearful of falling due to the knee giving out  as well as her pain, she was seen by Dr. Creola Corn and cleared for this  surgical procedure.  Risks and benefits were discussed with the patient  prior to surgical scheduling.   COURSE IN THE HOSPITAL:  The patient tolerated the surgical  procedure quite  well, was placed on Coumadin protocol postoperatively for the prevention of  DVT and will continue for four weeks after date of surgery.  She did very  well postoperatively.  Mild analgesics were given to control her discomfort.  She was continued on her home medications.  Hemovac was DC'd on the first  postoperative day without incident.  Dressing was changed on the second day,  showing the wound to be clean and dry without any evidence of infection or  gross drainage.  Sodium dropped to 131; however, this corrected itself.  Dr.  Timothy Lasso saw the patient in consult, adjusting her medications.  He agreed that  the rehabilitation program at [facility] would be advised for this  patient.  We agreed with him, and arrangements were made for her discharge.   LABORATORY VALUES IN THE HOSPITAL:  Hematologically showed a preoperative  CBC with some mild normocytic, normochromic anemia.  RBC was 3.37,  hemoglobin 11.1, hematocrit 32.3.  The next day, CBC showed a hemoglobin of  10.5 and hematocrit  of 30.1.  Blood chemistries on admission were  essentially within normal limits.  The glucose, however, was 109.  Sodium  dropped to 131.  We restricted water and offered juices and colas, and  sodium came up to 138.  Glucose was 165.  No electrocardiogram or urinalysis  seen on this chart.   CONDITION ON DISCHARGE:  To [facility] was  improved and stable.   She is to continue with the total knee protocol.  Weightbearing as  tolerated.  Any medical concerns will be addressed by Dr. Creola Corn.  Return to see Dr. Charlann Boxer in about six weeks after date of surgery.      Dooley L. Cherlynn June.      Madlyn Frankel Charlann Boxer, M.D.  Electronically Signed    DLU/MEDQ  D:  12/03/2004  T:  12/03/2004  Job:  [id number]   cc:   Gwen Pounds, MD  Fax: [phone]

## 2010-07-10 NOTE — H&P (Signed)
[name], [name]                ACCOUNT NO.:  [id number]   MEDICAL RECORD NO.:  [id number]           PATIENT TYPE:   LOCATION:                                 FACILITY:   PHYSICIAN:  Madlyn Frankel. Charlann Boxer, M.D.       DATE OF BIRTH:   DATE OF ADMISSION:  11/12/2004  DATE OF DISCHARGE:                                HISTORY & PHYSICAL   Date of office visit, history and physical is November 06, 2004.   CHIEF COMPLAINT:  Left knee pain.   HISTORY OF PRESENT ILLNESS:  The patient is a 76 year old female who has  been seen by Dr. Charlann Boxer for ongoing left knee pain. She has known arthritis  and has been seen by Dr. Sherlean Foot in the past and talked about doing knee  arthroscopy versus partial knee replacement versus total knee replacement.  Primary care physician is Dr. Timothy Lasso who kindly referred her over to Dr.  Charlann Boxer. She is seen in the office and found to have end-stage medial  compartment degenerative changes with bone-one-bone arthritis. Lateral view  also indicates a wear pattern. Her knee pain has been progressive in nature,  and despite conservative measures and injections she has reached the point  where she would like to have something done about it. Risks and benefits  have been discussed. She is felt to be a good candidate for knee replacement  and the patient was subsequently admitted to the hospital.   ALLERGIES:  No known drug allergies.   CURRENT MEDICATIONS:  Synthroid, Fosamax, Zoloft, methotrexate, folic acid,  Actos, glimepiride, Welchol, hydrochlorothiazide, Darvocet, Centrum Silver,  calcium, vitamin E, Prevacid, Tylenol.   PAST MEDICAL HISTORY:  1.  Renal calculi.  2.  Hypertension.  3.  Diabetes mellitus.  4.  Reflux disease.  5.  Polymyalgia rheumatica.  6.  Cervical degenerative disc disease.  7.  Lumbar degenerative disc disease.  8.  Right shoulder impingement syndrome.  9.  Hypothyroidism.   PAST SURGICAL HISTORY:  1.  Right knee arthroscopy.  2.  Right  rotator cuff repair.  3.  Lithotripsy x2.  4.  Right wrist ORIF surgery.   FAMILY HISTORY:  Significant for heart disease, stroke, and arthritis.   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 Denies the use of tobacco products,  social intake of alcohol. Widow with two adopted children.   REVIEW OF SYSTEMS:  GENERAL:  No fever, chills, or night sweats.  NEUROLOGICAL:  No seizure, syncope, or paralysis. RESPIRATORY:  No shortness  of breath, productive cough, or hemoptysis. CARDIOVASCULAR:  No chest pain,  angina, or orthopnea. GI:  No nausea, vomiting, diarrhea, or constipation.  GU:  No dysuria, hematuria, or discharge. MUSCULOSKELETAL:  Left knee found  in the history of present illness.   PHYSICAL EXAMINATION:  VITAL SIGNS:  Pulse is 72, respirations 14, blood  pressure 148/82.  GENERAL:  A 76 year old white female well-developed, well-nourished,  slightly overweight but in no acute distress. She is alert, oriented,  cooperative, and very pleasant at  the time of exam. She is a good historian.  HEENT:  Normocephalic and atraumatic. Pupils are equal, round, and reactive.  Oropharynx clear. She does have reading glasses. EOMs intact.  NECK:  Supple. No carotid bruits.  CHEST:  Clear anterior and posterior chest walls. No rhonchi, rales, or  wheezing.  HEART:  Regular rate and rhythm. S1 and S2 normal sinus rhythm.  ABDOMEN:  Soft, round, protuberant abdomen. Bowel sounds present.  RECTAL:  Not done, not pertinent to present illness.  BREASTS:  Not done, not pertinent to present illness.  GENITALIA:  Not done, not pertinent to present illness.  EXTREMITIES:  Left knee shows crepitus noted on passive range of motion. She  does have full extension. Knee is stable. She is tender on the anterior  portion of the knee. She does have an overlying knee laceration, contusion  recently. The overlying laceration contusion has  healed.   IMPRESSION:  1.  Osteoarthritis left knee.  2.  Renal calculi.  3.  Hypertension.  4.  Diabetes.  5.  Reflux disease.  6.  Polymyalgia rheumatica.  7.  Cervical degenerative disease.  8.  Lumbar degenerative disc disease.  9.  Right shoulder impingement syndrome.  10. Hypothyroidism.   PLAN:  The patient will be admitted to [facility] to undergo left  total knee replacement arthroplasty. Surgery will be performed with Dr.  Shelda Pal. Her medical doctor, Dr. Gwen Pounds, will be notified of  the room on admission and be consulted if needed for medical assistance with  the patient throughout the hospital course.      Alexzandrew L. Julien Girt, P.A.      Madlyn Frankel Charlann Boxer, M.D.  Electronically Signed    ALP/MEDQ  D:  11/08/2004  T:  11/09/2004  Job:  [id number]   cc:   Madlyn Frankel Charlann Boxer, M.D.  Fax: [phone]   Gwen Pounds, MD  Fax: [phone]

## 2010-07-10 NOTE — Op Note (Signed)
NAME:  [name], [name]                          ACCOUNT NO.:  [id number]   MEDICAL RECORD NO.:  [id number]                   PATIENT TYPE:  AMB   LOCATION:  [facility]                                  FACILITY:  [facility]   PHYSICIAN:  Mila Homer. Sherlean Foot, M.D.              DATE OF BIRTH:  1934/09/17   DATE OF PROCEDURE:  01/09/2003  DATE OF DISCHARGE:                                 OPERATIVE REPORT   PREOPERATIVE DIAGNOSIS:  Right shoulder rotator cuff tear.   POSTOPERATIVE DIAGNOSIS:  Right shoulder rotator cuff tear.   OPERATION PERFORMED:  Right shoulder arthroscopy with glenohumeral  debridement, subacromial decompression, distal clavicle resection and mini  open rotator cuff repair with TissueMend grafting.   SURGEON:  Mila Homer. Sherlean Foot, M.D.   ASSISTANT:  Richardean Canal, P.A.   ANESTHESIA:  General.   INDICATIONS FOR PROCEDURE:  The patient is a 76 year old white female with  chronic tearing of the rotator cuff.  Informed consent was obtained.   DESCRIPTION OF PROCEDURE:  The patient was laid supine and administered  general anesthesia and placed in a beach chair position.  Right shoulder  prepped and draped in the usual sterile fashion.  Anterolateral and  posterior portals were created with a #11 blade, blunt trocar and cannula.  Diagnostic arthroscopy revealed synovitis and a torn rotator cuff.  I  debrided the frayed labrum, frayed synovitis and rotator cuff.  I then went  from posterior into the subacromial space and from the direct lateral portal  performed a bursectomy as well as a distal clavicle resection and  anterolateral acromioplasty.  Once I was satisfied with the amount of bursa  removed, I then went to a mini open approach  extended the direct lateral  incision up to the anterolateral corner of the acromion.  I then pierced the  deltoid along the lines of its raphe and held it in place with Arthrex  shoulder retractor.  I then completed the far lateral bursectomy.  I  then  debrided bone with the arthroscopic debrider to create a large area of  bleeding bone on the bare area of the humerus. At this point I tagged the  rotator cuff with eight 0 Vicryl sutures using the Viper device. I then  freed up the rotator cuff to make it as mobile as possible but there was  still a gap of at least three centimeters.  At this point I used the  TissueMend and cut out a 3.5 x 3.5 cm piece and anchored it to the 0 Vicryl  sutures that were already in the rotator cuff.  I then slid the graft down  to the rotator cuff edge and tied it down with simple sutures.  I then  placed four 5.0 mm corkscrew anchors and placed eight horizontal mattress  sutures under tension into the graft.  I cut off the excess of graft and it  looked very,  very nice.  I then irrigated and closed the deltoid interval  with interrupted 0 Vicryl, then 2-0 Vicryl in the subcutaneous layer and  Steri-Strips in the skin.  Dressing with Xeroform, dressing sponges and 2-  inch silk tape, sling and swath.                                               Mila Homer. Sherlean Foot, M.D.    SDL/MEDQ  D:  01/09/2003  T:  01/10/2003  Job:  [id number]

## 2010-07-28 ENCOUNTER — Ambulatory Visit: Payer: PRIVATE HEALTH INSURANCE | Admitting: Dietician

## 2010-08-05 ENCOUNTER — Other Ambulatory Visit: Payer: Self-pay | Admitting: Dermatology

## 2010-08-24 ENCOUNTER — Telehealth: Payer: Self-pay | Admitting: Cardiology

## 2010-08-24 NOTE — Telephone Encounter (Signed)
Called requesting samples of Crestor. Lm that we do have available. Will pick up.

## 2010-08-24 NOTE — Telephone Encounter (Signed)
Called wantign samples of Crestor if any were available. Please call back. I have pulled the chart.

## 2010-09-18 ENCOUNTER — Telehealth: Payer: Self-pay | Admitting: Cardiology

## 2010-09-18 NOTE — Telephone Encounter (Signed)
She would like to know if she can pick up some samples of Crestor whenever it is convenient for you. Please call back. I have pulled her chart.

## 2010-09-18 NOTE — Telephone Encounter (Signed)
Called requesting samples of Crestor 10 mg. Have available. Will pick up

## 2010-11-03 ENCOUNTER — Encounter: Payer: Self-pay | Admitting: Cardiology

## 2010-11-03 ENCOUNTER — Other Ambulatory Visit (HOSPITAL_COMMUNITY): Payer: Self-pay | Admitting: Urology

## 2010-11-03 DIAGNOSIS — D49519 Neoplasm of unspecified behavior of unspecified kidney: Secondary | ICD-10-CM

## 2010-11-04 ENCOUNTER — Ambulatory Visit: Payer: Medicare Other | Admitting: Cardiology

## 2010-11-11 ENCOUNTER — Inpatient Hospital Stay (HOSPITAL_COMMUNITY): Admission: RE | Admit: 2010-11-11 | Payer: PRIVATE HEALTH INSURANCE | Source: Ambulatory Visit

## 2010-11-13 ENCOUNTER — Ambulatory Visit (INDEPENDENT_AMBULATORY_CARE_PROVIDER_SITE_OTHER): Payer: Medicare Other | Admitting: Cardiology

## 2010-11-13 ENCOUNTER — Encounter: Payer: Self-pay | Admitting: Cardiology

## 2010-11-13 DIAGNOSIS — I452 Bifascicular block: Secondary | ICD-10-CM | POA: Insufficient documentation

## 2010-11-13 DIAGNOSIS — G4733 Obstructive sleep apnea (adult) (pediatric): Secondary | ICD-10-CM

## 2010-11-13 DIAGNOSIS — E119 Type 2 diabetes mellitus without complications: Secondary | ICD-10-CM | POA: Insufficient documentation

## 2010-11-13 DIAGNOSIS — I10 Essential (primary) hypertension: Secondary | ICD-10-CM

## 2010-11-13 DIAGNOSIS — E785 Hyperlipidemia, unspecified: Secondary | ICD-10-CM

## 2010-11-13 NOTE — Assessment & Plan Note (Signed)
She remains asymptomatic. She has a chronic right bundle branch block and left anterior fascicular block. No pacemaker is required.

## 2010-11-13 NOTE — Progress Notes (Signed)
   [name] Date of Birth: [birth year]   History of Present Illness: [name] is seen for yearly followup. She has a history of hypertension, diabetes mellitus, and hyperlipidemia. She has a chronic bifascicular block. She has been asymptomatic. She denies any dizziness, syncope, palpitations, or shortness of breath. This summer she went to the beach and forgot to take her medications. She felt better so she quit taking her medications altogether. This includes her blood pressure medication, statin therapy, and her diabetic medications. She does not monitor her blood pressure or blood sugar at home.  Current Outpatient Prescriptions on File Prior to Visit  Medication Sig Dispense Refill  . Levothyroxine Sodium (SYNTHROID PO) Take by mouth daily.        . Cholecalciferol (VITAMIN D PO) Take by mouth daily.        Marland Kitchen FOLIC ACID PO Take by mouth 2 (two) times daily.        . rosuvastatin (CRESTOR) 10 MG tablet Take 10 mg by mouth daily. 15mg       . Sertraline HCl (ZOLOFT PO) Take by mouth daily.          No Known Allergies  Past Medical History  Diagnosis Date  . Hyperlipidemia   . Hypertension   . Obstructive sleep apnea   . Bifascicular block   . Diabetes mellitus   . Thyroid disease   . Hypothyroidism   . Gastric ulcer   . Squamous cell skin cancer     Past Surgical History  Procedure Date  . Total knee arthroplasty     Left knee  . Tonsillectomy   . Appendectomy   . Hernia repair     History  Smoking status  . Former Smoker  . Quit date: 11/02/2004  Smokeless tobacco  . Not on file    History  Alcohol Use     Family History  Problem Relation Age of Onset  . Heart failure Mother     Review of Systems: The review of systems is positive for noncompliance with her medication. She reports that she does have a renal mass and is scheduled for an MRI. All other systems were reviewed and are negative.  Physical Exam: BP 162/106  Pulse 72  Ht 5\' 3"  (1.6  m)  Wt 211 lb 6.4 oz (95.89 kg)  BMI 37.45 kg/m2 She is an obese white female in no acute distress. She is normocephalic, atraumatic. Pupils are equal round and reactive to light and accommodation. Extraocular movements are full. Oropharynx is clear. Neck is supple without JVD, adenopathy, thyromegaly, or bruits. Lungs are clear. Cardiac exam reveals a grade 1/6 systolic murmur at the left sternal border. Abdomen is obese, soft, nontender. Extremities are without edema. Pedal pulses are palpable. Neurologic exam is nonfocal. LABORATORY DATA:   Assessment / Plan:

## 2010-11-13 NOTE — Assessment & Plan Note (Signed)
Blood pressure is severely elevated today. She reports her blood pressure was well controlled on her prior medications. I recommended that she resume her benazepril and Crestor. She has a followup Dr. Timothy Lasso next week to discuss her diabetic management. I stressed the importance of taking her medication for managing her risk.

## 2010-11-13 NOTE — Patient Instructions (Signed)
Resume your benazepril and crestor.  Keep your appointment with Dr. Timothy Lasso next week.

## 2010-11-18 ENCOUNTER — Other Ambulatory Visit (HOSPITAL_COMMUNITY): Payer: PRIVATE HEALTH INSURANCE

## 2010-11-23 ENCOUNTER — Ambulatory Visit (HOSPITAL_COMMUNITY)
Admission: RE | Admit: 2010-11-23 | Discharge: 2010-11-23 | Disposition: A | Discharge status: Home / Self Care | Payer: Medicare Other | Source: Ambulatory Visit | Attending: Urology | Admitting: Urology

## 2010-11-23 DIAGNOSIS — K8020 Calculus of gallbladder without cholecystitis without obstruction: Secondary | ICD-10-CM | POA: Insufficient documentation

## 2010-11-23 DIAGNOSIS — D3500 Benign neoplasm of unspecified adrenal gland: Secondary | ICD-10-CM | POA: Insufficient documentation

## 2010-11-23 DIAGNOSIS — N289 Disorder of kidney and ureter, unspecified: Secondary | ICD-10-CM | POA: Insufficient documentation

## 2010-11-23 DIAGNOSIS — D49519 Neoplasm of unspecified behavior of unspecified kidney: Secondary | ICD-10-CM

## 2010-11-23 MED ORDER — GADOBENATE DIMEGLUMINE 529 MG/ML IV SOLN
10.0000 mL | Freq: Once | INTRAVENOUS | Status: AC | PRN
  Administered 2010-11-23: 10 mL via INTRAVENOUS

## 2010-12-23 ENCOUNTER — Ambulatory Visit: Payer: Medicare Other | Attending: Internal Medicine | Admitting: Physical Therapy

## 2010-12-23 ENCOUNTER — Ambulatory Visit: Payer: Medicare Other | Admitting: Physical Therapy

## 2010-12-23 DIAGNOSIS — R269 Unspecified abnormalities of gait and mobility: Secondary | ICD-10-CM | POA: Insufficient documentation

## 2010-12-23 DIAGNOSIS — M6281 Muscle weakness (generalized): Secondary | ICD-10-CM | POA: Insufficient documentation

## 2010-12-23 DIAGNOSIS — IMO0001 Reserved for inherently not codable concepts without codable children: Secondary | ICD-10-CM | POA: Insufficient documentation

## 2010-12-28 ENCOUNTER — Ambulatory Visit: Payer: Medicare Other | Attending: Internal Medicine | Admitting: Physical Therapy

## 2010-12-28 DIAGNOSIS — R269 Unspecified abnormalities of gait and mobility: Secondary | ICD-10-CM | POA: Insufficient documentation

## 2010-12-28 DIAGNOSIS — M6281 Muscle weakness (generalized): Secondary | ICD-10-CM | POA: Insufficient documentation

## 2010-12-28 DIAGNOSIS — IMO0001 Reserved for inherently not codable concepts without codable children: Secondary | ICD-10-CM | POA: Insufficient documentation

## 2010-12-29 ENCOUNTER — Telehealth: Payer: Self-pay | Admitting: Cardiology

## 2010-12-29 NOTE — Telephone Encounter (Signed)
Called requesting samples of Crestor 10 mg. Have available. She will pick up.

## 2010-12-29 NOTE — Telephone Encounter (Signed)
New message:  Pt is out of crestor.  Do you have samples she can pick up?  Please call her at [phone] and let her know.

## 2011-01-01 ENCOUNTER — Ambulatory Visit: Payer: Medicare Other | Admitting: Physical Therapy

## 2011-01-04 ENCOUNTER — Ambulatory Visit: Payer: Medicare Other | Admitting: Physical Therapy

## 2011-01-06 ENCOUNTER — Ambulatory Visit: Payer: Medicare Other | Admitting: Physical Therapy

## 2011-01-11 ENCOUNTER — Encounter: Payer: PRIVATE HEALTH INSURANCE | Admitting: Physical Therapy

## 2011-01-12 ENCOUNTER — Ambulatory Visit: Payer: Medicare Other | Admitting: Physical Therapy

## 2011-01-19 ENCOUNTER — Ambulatory Visit: Payer: Medicare Other | Admitting: Physical Therapy

## 2011-01-21 ENCOUNTER — Ambulatory Visit: Payer: Medicare Other | Admitting: Physical Therapy

## 2011-01-26 ENCOUNTER — Ambulatory Visit: Payer: Medicare Other | Attending: Internal Medicine | Admitting: Physical Therapy

## 2011-01-26 DIAGNOSIS — R269 Unspecified abnormalities of gait and mobility: Secondary | ICD-10-CM | POA: Insufficient documentation

## 2011-01-26 DIAGNOSIS — IMO0001 Reserved for inherently not codable concepts without codable children: Secondary | ICD-10-CM | POA: Insufficient documentation

## 2011-01-26 DIAGNOSIS — M6281 Muscle weakness (generalized): Secondary | ICD-10-CM | POA: Insufficient documentation

## 2011-01-29 ENCOUNTER — Ambulatory Visit: Payer: Medicare Other | Admitting: Physical Therapy

## 2011-02-01 ENCOUNTER — Ambulatory Visit: Payer: Medicare Other | Admitting: Physical Therapy

## 2011-02-01 ENCOUNTER — Telehealth: Payer: Self-pay | Admitting: Cardiology

## 2011-02-01 NOTE — Telephone Encounter (Signed)
Lm that we do not have any samples of Crestor. Advised to call back if needs Rx called in.

## 2011-02-01 NOTE — Telephone Encounter (Signed)
New Msg: Pt calling wanting samples of crestor. Please return pt call to discuss further.

## 2011-02-03 ENCOUNTER — Ambulatory Visit: Payer: Medicare Other | Admitting: Physical Therapy

## 2011-02-09 ENCOUNTER — Ambulatory Visit: Payer: Medicare Other | Admitting: Physical Therapy

## 2011-02-12 ENCOUNTER — Ambulatory Visit: Payer: PRIVATE HEALTH INSURANCE | Admitting: Physical Therapy

## 2011-02-18 ENCOUNTER — Ambulatory Visit: Payer: PRIVATE HEALTH INSURANCE | Admitting: Physical Therapy

## 2011-02-19 ENCOUNTER — Ambulatory Visit: Payer: PRIVATE HEALTH INSURANCE | Admitting: Physical Therapy

## 2011-03-01 DIAGNOSIS — M353 Polymyalgia rheumatica: Secondary | ICD-10-CM | POA: Diagnosis not present

## 2011-03-01 DIAGNOSIS — R269 Unspecified abnormalities of gait and mobility: Secondary | ICD-10-CM | POA: Diagnosis not present

## 2011-03-01 DIAGNOSIS — M2550 Pain in unspecified joint: Secondary | ICD-10-CM | POA: Diagnosis not present

## 2011-03-01 DIAGNOSIS — E1149 Type 2 diabetes mellitus with other diabetic neurological complication: Secondary | ICD-10-CM | POA: Diagnosis not present

## 2011-03-01 DIAGNOSIS — E039 Hypothyroidism, unspecified: Secondary | ICD-10-CM | POA: Diagnosis not present

## 2011-03-01 DIAGNOSIS — E785 Hyperlipidemia, unspecified: Secondary | ICD-10-CM | POA: Diagnosis not present

## 2011-03-01 DIAGNOSIS — I10 Essential (primary) hypertension: Secondary | ICD-10-CM | POA: Diagnosis not present

## 2011-03-03 ENCOUNTER — Telehealth: Payer: Self-pay | Admitting: Cardiology

## 2011-03-03 NOTE — Telephone Encounter (Signed)
New msg Pt wants samples of crestor please call and let her know

## 2011-03-03 NOTE — Telephone Encounter (Signed)
Samples of crestor 10 mg left at front desk 3rd floor.Left message on patient's voice mail.

## 2011-03-26 DIAGNOSIS — R5381 Other malaise: Secondary | ICD-10-CM | POA: Diagnosis not present

## 2011-03-26 DIAGNOSIS — M25569 Pain in unspecified knee: Secondary | ICD-10-CM | POA: Diagnosis not present

## 2011-03-26 DIAGNOSIS — R6889 Other general symptoms and signs: Secondary | ICD-10-CM | POA: Diagnosis not present

## 2011-03-30 ENCOUNTER — Ambulatory Visit: Payer: Medicare Other | Attending: Internal Medicine | Admitting: Physical Therapy

## 2011-03-30 ENCOUNTER — Ambulatory Visit: Payer: PRIVATE HEALTH INSURANCE | Admitting: Physical Therapy

## 2011-03-30 DIAGNOSIS — IMO0001 Reserved for inherently not codable concepts without codable children: Secondary | ICD-10-CM | POA: Insufficient documentation

## 2011-03-30 DIAGNOSIS — R269 Unspecified abnormalities of gait and mobility: Secondary | ICD-10-CM | POA: Insufficient documentation

## 2011-03-30 DIAGNOSIS — M6281 Muscle weakness (generalized): Secondary | ICD-10-CM | POA: Insufficient documentation

## 2011-05-06 ENCOUNTER — Telehealth: Payer: Self-pay | Admitting: Cardiology

## 2011-05-06 NOTE — Telephone Encounter (Signed)
New msg Pt wants crestor samples. Please call if have any

## 2011-05-06 NOTE — Telephone Encounter (Signed)
Patient called left message on personal voice mail samples of crestor 10 mg left at 3rd floor front desk.

## 2011-05-25 ENCOUNTER — Ambulatory Visit: Payer: Medicare Other | Attending: Internal Medicine | Admitting: Physical Therapy

## 2011-05-25 DIAGNOSIS — IMO0001 Reserved for inherently not codable concepts without codable children: Secondary | ICD-10-CM | POA: Insufficient documentation

## 2011-05-25 DIAGNOSIS — M6281 Muscle weakness (generalized): Secondary | ICD-10-CM | POA: Insufficient documentation

## 2011-05-25 DIAGNOSIS — R269 Unspecified abnormalities of gait and mobility: Secondary | ICD-10-CM | POA: Insufficient documentation

## 2011-06-14 DIAGNOSIS — J45909 Unspecified asthma, uncomplicated: Secondary | ICD-10-CM | POA: Diagnosis not present

## 2011-06-14 DIAGNOSIS — E1149 Type 2 diabetes mellitus with other diabetic neurological complication: Secondary | ICD-10-CM | POA: Diagnosis not present

## 2011-06-14 DIAGNOSIS — J209 Acute bronchitis, unspecified: Secondary | ICD-10-CM | POA: Diagnosis not present

## 2011-06-14 DIAGNOSIS — I10 Essential (primary) hypertension: Secondary | ICD-10-CM | POA: Diagnosis not present

## 2011-06-23 DIAGNOSIS — M064 Inflammatory polyarthropathy: Secondary | ICD-10-CM | POA: Diagnosis not present

## 2011-06-23 DIAGNOSIS — L408 Other psoriasis: Secondary | ICD-10-CM | POA: Diagnosis not present

## 2011-06-23 DIAGNOSIS — M5137 Other intervertebral disc degeneration, lumbosacral region: Secondary | ICD-10-CM | POA: Diagnosis not present

## 2011-06-23 DIAGNOSIS — M810 Age-related osteoporosis without current pathological fracture: Secondary | ICD-10-CM | POA: Diagnosis not present

## 2011-06-29 DIAGNOSIS — M353 Polymyalgia rheumatica: Secondary | ICD-10-CM | POA: Diagnosis not present

## 2011-06-29 DIAGNOSIS — E039 Hypothyroidism, unspecified: Secondary | ICD-10-CM | POA: Diagnosis not present

## 2011-06-29 DIAGNOSIS — E1149 Type 2 diabetes mellitus with other diabetic neurological complication: Secondary | ICD-10-CM | POA: Diagnosis not present

## 2011-06-29 DIAGNOSIS — M810 Age-related osteoporosis without current pathological fracture: Secondary | ICD-10-CM | POA: Diagnosis not present

## 2011-06-30 DIAGNOSIS — H40059 Ocular hypertension, unspecified eye: Secondary | ICD-10-CM | POA: Diagnosis not present

## 2011-07-01 DIAGNOSIS — M2550 Pain in unspecified joint: Secondary | ICD-10-CM | POA: Diagnosis not present

## 2011-07-22 DIAGNOSIS — L408 Other psoriasis: Secondary | ICD-10-CM | POA: Diagnosis not present

## 2011-07-22 DIAGNOSIS — M810 Age-related osteoporosis without current pathological fracture: Secondary | ICD-10-CM | POA: Diagnosis not present

## 2011-07-22 DIAGNOSIS — M064 Inflammatory polyarthropathy: Secondary | ICD-10-CM | POA: Diagnosis not present

## 2011-07-22 DIAGNOSIS — M5137 Other intervertebral disc degeneration, lumbosacral region: Secondary | ICD-10-CM | POA: Diagnosis not present

## 2011-07-27 ENCOUNTER — Telehealth: Payer: Self-pay | Admitting: Cardiology

## 2011-07-27 NOTE — Telephone Encounter (Signed)
New msg Patient wants to get samples of crestor

## 2011-07-27 NOTE — Telephone Encounter (Signed)
Patient called was told samples of crestor 10 mg left at front desk 3rd floor.

## 2011-09-29 DIAGNOSIS — M5137 Other intervertebral disc degeneration, lumbosacral region: Secondary | ICD-10-CM | POA: Diagnosis not present

## 2011-09-29 DIAGNOSIS — IMO0001 Reserved for inherently not codable concepts without codable children: Secondary | ICD-10-CM | POA: Diagnosis not present

## 2011-09-29 DIAGNOSIS — M810 Age-related osteoporosis without current pathological fracture: Secondary | ICD-10-CM | POA: Diagnosis not present

## 2011-09-29 DIAGNOSIS — M25539 Pain in unspecified wrist: Secondary | ICD-10-CM | POA: Diagnosis not present

## 2011-10-01 ENCOUNTER — Telehealth: Payer: Self-pay | Admitting: Cardiology

## 2011-10-01 NOTE — Telephone Encounter (Signed)
New Problem:   Patient called in wanting to know if we had any samples of rosuvastatin (CRESTOR) 10 MG tablet available for her to have.  Please call back.

## 2011-10-01 NOTE — Telephone Encounter (Signed)
Patient called  samples of crestor 10 mg left at front desk 3rd floor.

## 2011-10-18 DIAGNOSIS — E1149 Type 2 diabetes mellitus with other diabetic neurological complication: Secondary | ICD-10-CM | POA: Diagnosis not present

## 2011-10-18 DIAGNOSIS — M353 Polymyalgia rheumatica: Secondary | ICD-10-CM | POA: Diagnosis not present

## 2011-10-18 DIAGNOSIS — M810 Age-related osteoporosis without current pathological fracture: Secondary | ICD-10-CM | POA: Diagnosis not present

## 2011-10-18 DIAGNOSIS — G589 Mononeuropathy, unspecified: Secondary | ICD-10-CM | POA: Diagnosis not present

## 2011-10-28 ENCOUNTER — Other Ambulatory Visit: Payer: Self-pay | Admitting: Dermatology

## 2011-10-28 DIAGNOSIS — C44621 Squamous cell carcinoma of skin of unspecified upper limb, including shoulder: Secondary | ICD-10-CM | POA: Diagnosis not present

## 2011-10-28 DIAGNOSIS — L821 Other seborrheic keratosis: Secondary | ICD-10-CM | POA: Diagnosis not present

## 2011-11-04 DIAGNOSIS — M810 Age-related osteoporosis without current pathological fracture: Secondary | ICD-10-CM | POA: Diagnosis not present

## 2011-11-04 DIAGNOSIS — E1149 Type 2 diabetes mellitus with other diabetic neurological complication: Secondary | ICD-10-CM | POA: Diagnosis not present

## 2011-12-01 ENCOUNTER — Telehealth: Payer: Self-pay | Admitting: Cardiology

## 2011-12-01 NOTE — Telephone Encounter (Signed)
Patient called was told out of bystolic samples.

## 2011-12-01 NOTE — Telephone Encounter (Signed)
Pt requesting samples of crestor °

## 2011-12-28 ENCOUNTER — Other Ambulatory Visit (HOSPITAL_COMMUNITY): Payer: Self-pay | Admitting: Urology

## 2011-12-28 DIAGNOSIS — N289 Disorder of kidney and ureter, unspecified: Secondary | ICD-10-CM

## 2012-01-05 ENCOUNTER — Telehealth: Payer: Self-pay | Admitting: Cardiology

## 2012-01-05 NOTE — Telephone Encounter (Signed)
Pt need samples of crestor

## 2012-01-05 NOTE — Telephone Encounter (Signed)
LMOM that I Ieft samples of Crestor 10mg  4 packs at the front desk.

## 2012-01-10 DIAGNOSIS — H40059 Ocular hypertension, unspecified eye: Secondary | ICD-10-CM | POA: Diagnosis not present

## 2012-01-13 DIAGNOSIS — IMO0002 Reserved for concepts with insufficient information to code with codable children: Secondary | ICD-10-CM | POA: Diagnosis not present

## 2012-01-13 DIAGNOSIS — M353 Polymyalgia rheumatica: Secondary | ICD-10-CM | POA: Diagnosis not present

## 2012-01-13 DIAGNOSIS — M25549 Pain in joints of unspecified hand: Secondary | ICD-10-CM | POA: Diagnosis not present

## 2012-01-13 DIAGNOSIS — R5383 Other fatigue: Secondary | ICD-10-CM | POA: Diagnosis not present

## 2012-01-13 DIAGNOSIS — M1710 Unilateral primary osteoarthritis, unspecified knee: Secondary | ICD-10-CM | POA: Diagnosis not present

## 2012-01-13 DIAGNOSIS — R5381 Other malaise: Secondary | ICD-10-CM | POA: Diagnosis not present

## 2012-01-17 DIAGNOSIS — Z23 Encounter for immunization: Secondary | ICD-10-CM | POA: Diagnosis not present

## 2012-01-17 DIAGNOSIS — M353 Polymyalgia rheumatica: Secondary | ICD-10-CM | POA: Diagnosis not present

## 2012-01-17 DIAGNOSIS — E538 Deficiency of other specified B group vitamins: Secondary | ICD-10-CM | POA: Diagnosis not present

## 2012-01-17 DIAGNOSIS — R32 Unspecified urinary incontinence: Secondary | ICD-10-CM | POA: Diagnosis not present

## 2012-01-17 DIAGNOSIS — M810 Age-related osteoporosis without current pathological fracture: Secondary | ICD-10-CM | POA: Diagnosis not present

## 2012-01-17 DIAGNOSIS — E039 Hypothyroidism, unspecified: Secondary | ICD-10-CM | POA: Diagnosis not present

## 2012-01-17 DIAGNOSIS — E1149 Type 2 diabetes mellitus with other diabetic neurological complication: Secondary | ICD-10-CM | POA: Diagnosis not present

## 2012-01-26 ENCOUNTER — Ambulatory Visit (HOSPITAL_COMMUNITY)
Admission: RE | Admit: 2012-01-26 | Discharge: 2012-01-26 | Disposition: A | Discharge status: Home / Self Care | Payer: Medicare Other | Source: Ambulatory Visit | Attending: Urology | Admitting: Urology

## 2012-01-26 DIAGNOSIS — K8020 Calculus of gallbladder without cholecystitis without obstruction: Secondary | ICD-10-CM | POA: Insufficient documentation

## 2012-01-26 DIAGNOSIS — N289 Disorder of kidney and ureter, unspecified: Secondary | ICD-10-CM

## 2012-01-26 DIAGNOSIS — D3500 Benign neoplasm of unspecified adrenal gland: Secondary | ICD-10-CM | POA: Diagnosis not present

## 2012-01-26 LAB — CREATININE, SERUM
Creatinine, Ser: 1.28 mg/dL — ABNORMAL HIGH (ref 0.50–1.10)
GFR calc non Af Amer: 40 mL/min — ABNORMAL LOW (ref >90)

## 2012-01-26 MED ORDER — GADOBENATE DIMEGLUMINE 529 MG/ML IV SOLN
10.0000 mL | Freq: Once | INTRAVENOUS | Status: AC | PRN
  Administered 2012-01-26: 10 mL via INTRAVENOUS

## 2012-02-04 DIAGNOSIS — Z124 Encounter for screening for malignant neoplasm of cervix: Secondary | ICD-10-CM | POA: Diagnosis not present

## 2012-02-04 DIAGNOSIS — Z1231 Encounter for screening mammogram for malignant neoplasm of breast: Secondary | ICD-10-CM | POA: Diagnosis not present

## 2012-02-10 DIAGNOSIS — L919 Hypertrophic disorder of the skin, unspecified: Secondary | ICD-10-CM | POA: Diagnosis not present

## 2012-02-10 DIAGNOSIS — Z85828 Personal history of other malignant neoplasm of skin: Secondary | ICD-10-CM | POA: Diagnosis not present

## 2012-02-10 DIAGNOSIS — L909 Atrophic disorder of skin, unspecified: Secondary | ICD-10-CM | POA: Diagnosis not present

## 2012-02-10 DIAGNOSIS — L820 Inflamed seborrheic keratosis: Secondary | ICD-10-CM | POA: Diagnosis not present

## 2012-02-25 DIAGNOSIS — R0602 Shortness of breath: Secondary | ICD-10-CM | POA: Diagnosis not present

## 2012-02-25 DIAGNOSIS — J209 Acute bronchitis, unspecified: Secondary | ICD-10-CM | POA: Diagnosis not present

## 2012-02-25 DIAGNOSIS — J45909 Unspecified asthma, uncomplicated: Secondary | ICD-10-CM | POA: Diagnosis not present

## 2012-02-25 DIAGNOSIS — R05 Cough: Secondary | ICD-10-CM | POA: Diagnosis not present

## 2012-02-25 DIAGNOSIS — R059 Cough, unspecified: Secondary | ICD-10-CM | POA: Diagnosis not present

## 2012-03-13 DIAGNOSIS — E1149 Type 2 diabetes mellitus with other diabetic neurological complication: Secondary | ICD-10-CM | POA: Diagnosis not present

## 2012-03-13 DIAGNOSIS — M353 Polymyalgia rheumatica: Secondary | ICD-10-CM | POA: Diagnosis not present

## 2012-03-13 DIAGNOSIS — I10 Essential (primary) hypertension: Secondary | ICD-10-CM | POA: Diagnosis not present

## 2012-03-13 DIAGNOSIS — R32 Unspecified urinary incontinence: Secondary | ICD-10-CM | POA: Diagnosis not present

## 2012-03-24 DIAGNOSIS — N289 Disorder of kidney and ureter, unspecified: Secondary | ICD-10-CM | POA: Diagnosis not present

## 2012-03-31 ENCOUNTER — Inpatient Hospital Stay (HOSPITAL_COMMUNITY): Payer: Medicare Other

## 2012-03-31 ENCOUNTER — Ambulatory Visit (HOSPITAL_COMMUNITY)
Admission: RE | Admit: 2012-03-31 | Discharge: 2012-03-31 | Disposition: A | Discharge status: Home / Self Care | Payer: Medicare Other | Source: Ambulatory Visit | Attending: Internal Medicine | Admitting: Internal Medicine

## 2012-03-31 ENCOUNTER — Other Ambulatory Visit (HOSPITAL_COMMUNITY): Payer: Self-pay | Admitting: Internal Medicine

## 2012-03-31 ENCOUNTER — Inpatient Hospital Stay (HOSPITAL_COMMUNITY)
Admission: AD | Admit: 2012-03-31 | Discharge: 2012-04-04 | DRG: 065 | Disposition: A | Discharge status: Rehabilitation Facility | Payer: Medicare Other | Source: Ambulatory Visit | Attending: Internal Medicine | Admitting: Internal Medicine

## 2012-03-31 ENCOUNTER — Encounter (HOSPITAL_COMMUNITY): Payer: Self-pay | Admitting: General Practice

## 2012-03-31 DIAGNOSIS — E1149 Type 2 diabetes mellitus with other diabetic neurological complication: Secondary | ICD-10-CM | POA: Diagnosis present

## 2012-03-31 DIAGNOSIS — E039 Hypothyroidism, unspecified: Secondary | ICD-10-CM | POA: Diagnosis present

## 2012-03-31 DIAGNOSIS — Z87891 Personal history of nicotine dependence: Secondary | ICD-10-CM | POA: Diagnosis not present

## 2012-03-31 DIAGNOSIS — M810 Age-related osteoporosis without current pathological fracture: Secondary | ICD-10-CM | POA: Diagnosis present

## 2012-03-31 DIAGNOSIS — IMO0002 Reserved for concepts with insufficient information to code with codable children: Secondary | ICD-10-CM

## 2012-03-31 DIAGNOSIS — E1142 Type 2 diabetes mellitus with diabetic polyneuropathy: Secondary | ICD-10-CM | POA: Diagnosis present

## 2012-03-31 DIAGNOSIS — Z7982 Long term (current) use of aspirin: Secondary | ICD-10-CM

## 2012-03-31 DIAGNOSIS — R4789 Other speech disturbances: Secondary | ICD-10-CM | POA: Diagnosis not present

## 2012-03-31 DIAGNOSIS — I639 Cerebral infarction, unspecified: Secondary | ICD-10-CM

## 2012-03-31 DIAGNOSIS — R471 Dysarthria and anarthria: Secondary | ICD-10-CM | POA: Diagnosis present

## 2012-03-31 DIAGNOSIS — F329 Major depressive disorder, single episode, unspecified: Secondary | ICD-10-CM | POA: Diagnosis present

## 2012-03-31 DIAGNOSIS — J438 Other emphysema: Secondary | ICD-10-CM | POA: Diagnosis not present

## 2012-03-31 DIAGNOSIS — G4733 Obstructive sleep apnea (adult) (pediatric): Secondary | ICD-10-CM | POA: Diagnosis present

## 2012-03-31 DIAGNOSIS — I10 Essential (primary) hypertension: Secondary | ICD-10-CM | POA: Diagnosis present

## 2012-03-31 DIAGNOSIS — R29898 Other symptoms and signs involving the musculoskeletal system: Secondary | ICD-10-CM | POA: Diagnosis present

## 2012-03-31 DIAGNOSIS — E785 Hyperlipidemia, unspecified: Secondary | ICD-10-CM | POA: Diagnosis present

## 2012-03-31 DIAGNOSIS — K219 Gastro-esophageal reflux disease without esophagitis: Secondary | ICD-10-CM | POA: Diagnosis present

## 2012-03-31 DIAGNOSIS — G8190 Hemiplegia, unspecified affecting unspecified side: Secondary | ICD-10-CM | POA: Diagnosis not present

## 2012-03-31 DIAGNOSIS — R930 Abnormal findings on diagnostic imaging of skull and head, not elsewhere classified: Secondary | ICD-10-CM | POA: Diagnosis not present

## 2012-03-31 DIAGNOSIS — R112 Nausea with vomiting, unspecified: Secondary | ICD-10-CM | POA: Diagnosis not present

## 2012-03-31 DIAGNOSIS — Z5189 Encounter for other specified aftercare: Secondary | ICD-10-CM | POA: Diagnosis not present

## 2012-03-31 DIAGNOSIS — N318 Other neuromuscular dysfunction of bladder: Secondary | ICD-10-CM | POA: Diagnosis present

## 2012-03-31 DIAGNOSIS — R279 Unspecified lack of coordination: Secondary | ICD-10-CM | POA: Diagnosis present

## 2012-03-31 DIAGNOSIS — R110 Nausea: Secondary | ICD-10-CM | POA: Diagnosis not present

## 2012-03-31 DIAGNOSIS — I6330 Cerebral infarction due to thrombosis of unspecified cerebral artery: Secondary | ICD-10-CM | POA: Diagnosis not present

## 2012-03-31 DIAGNOSIS — M353 Polymyalgia rheumatica: Secondary | ICD-10-CM | POA: Diagnosis present

## 2012-03-31 DIAGNOSIS — Z96659 Presence of unspecified artificial knee joint: Secondary | ICD-10-CM | POA: Diagnosis not present

## 2012-03-31 DIAGNOSIS — I452 Bifascicular block: Secondary | ICD-10-CM | POA: Diagnosis present

## 2012-03-31 DIAGNOSIS — Z79899 Other long term (current) drug therapy: Secondary | ICD-10-CM | POA: Diagnosis not present

## 2012-03-31 DIAGNOSIS — E119 Type 2 diabetes mellitus without complications: Secondary | ICD-10-CM | POA: Diagnosis present

## 2012-03-31 DIAGNOSIS — I6789 Other cerebrovascular disease: Secondary | ICD-10-CM | POA: Diagnosis not present

## 2012-03-31 DIAGNOSIS — I69393 Ataxia following cerebral infarction: Secondary | ICD-10-CM

## 2012-03-31 DIAGNOSIS — I6350 Cerebral infarction due to unspecified occlusion or stenosis of unspecified cerebral artery: Secondary | ICD-10-CM | POA: Diagnosis not present

## 2012-03-31 DIAGNOSIS — F3289 Other specified depressive episodes: Secondary | ICD-10-CM | POA: Diagnosis present

## 2012-03-31 HISTORY — DX: Gastro-esophageal reflux disease without esophagitis: K21.9

## 2012-03-31 HISTORY — DX: Cerebral infarction, unspecified: I63.9

## 2012-03-31 HISTORY — DX: Unspecified osteoarthritis, unspecified site: M19.90

## 2012-03-31 LAB — CBC
HCT: 45.6 % (ref 36.0–46.0)
MCH: 31.9 pg (ref 26.0–34.0)
MCV: 93.8 fL (ref 78.0–100.0)
Platelets: 209 10*3/uL (ref 150–400)
RBC: 4.86 MIL/uL (ref 3.87–5.11)
RDW: 13.1 % (ref 11.5–15.5)

## 2012-03-31 LAB — GLUCOSE, CAPILLARY: Glucose-Capillary: 133 mg/dL — ABNORMAL HIGH (ref 70–99)

## 2012-03-31 MED ORDER — VITAMIN B-12 1000 MCG PO TABS
1000.0000 ug | ORAL_TABLET | Freq: Every day | ORAL | Status: DC
  Administered 2012-03-31 – 2012-04-04 (×5): 1000 ug via ORAL
  Filled 2012-03-31 (×5): qty 1

## 2012-03-31 MED ORDER — SERTRALINE HCL 100 MG PO TABS
100.0000 mg | ORAL_TABLET | Freq: Every day | ORAL | Status: DC
  Administered 2012-03-31 – 2012-04-04 (×5): 100 mg via ORAL
  Filled 2012-03-31 (×5): qty 1

## 2012-03-31 MED ORDER — DONEPEZIL HCL 5 MG PO TABS
5.0000 mg | ORAL_TABLET | Freq: Every day | ORAL | Status: DC
  Administered 2012-03-31 – 2012-04-03 (×4): 5 mg via ORAL
  Filled 2012-03-31 (×5): qty 1

## 2012-03-31 MED ORDER — SODIUM CHLORIDE 0.9 % IV SOLN
INTRAVENOUS | Status: DC
  Administered 2012-03-31: 18:00:00 via INTRAVENOUS

## 2012-03-31 MED ORDER — LEVOTHYROXINE SODIUM 100 MCG PO TABS: 100.0000 ug | ORAL_TABLET | Freq: Every day | ORAL | Status: DC

## 2012-03-31 MED ORDER — ATORVASTATIN CALCIUM 40 MG PO TABS
40.0000 mg | ORAL_TABLET | Freq: Every day | ORAL | Status: DC
  Administered 2012-03-31 – 2012-04-04 (×5): 40 mg via ORAL
  Filled 2012-03-31 (×5): qty 1

## 2012-03-31 MED ORDER — PREDNISONE 1 MG PO TABS
4.0000 mg | ORAL_TABLET | Freq: Every day | ORAL | Status: DC
  Administered 2012-04-01 – 2012-04-04 (×4): 4 mg via ORAL
  Filled 2012-03-31 (×5): qty 4

## 2012-03-31 MED ORDER — SENNOSIDES-DOCUSATE SODIUM 8.6-50 MG PO TABS: 1.0000 | ORAL_TABLET | Freq: Every evening | ORAL | Status: DC | PRN

## 2012-03-31 MED ORDER — ASPIRIN 325 MG PO TABS
325.0000 mg | ORAL_TABLET | Freq: Every day | ORAL | Status: DC
  Administered 2012-03-31 – 2012-04-04 (×5): 325 mg via ORAL
  Filled 2012-03-31 (×5): qty 1

## 2012-03-31 MED ORDER — VITAMIN D3 25 MCG (1000 UNIT) PO TABS
5000.0000 [IU] | ORAL_TABLET | Freq: Every day | ORAL | Status: DC
  Administered 2012-04-01 – 2012-04-04 (×4): 5000 [IU] via ORAL
  Filled 2012-03-31 (×4): qty 5

## 2012-03-31 MED ORDER — BENAZEPRIL HCL 20 MG PO TABS
20.0000 mg | ORAL_TABLET | Freq: Every day | ORAL | Status: DC
  Administered 2012-03-31 – 2012-04-04 (×5): 20 mg via ORAL
  Filled 2012-03-31 (×5): qty 1

## 2012-03-31 MED ORDER — CHOLECALCIFEROL 125 MCG (5000 UT) PO CAPS: 5000.0000 [IU] | ORAL_CAPSULE | Freq: Every day | ORAL | Status: DC

## 2012-03-31 MED ORDER — ASPIRIN 300 MG RE SUPP
300.0000 mg | Freq: Every day | RECTAL | Status: DC
  Filled 2012-03-31: qty 1

## 2012-03-31 MED ORDER — ENOXAPARIN SODIUM 40 MG/0.4ML ~~LOC~~ SOLN
40.0000 mg | SUBCUTANEOUS | Status: DC
  Administered 2012-03-31 – 2012-04-04 (×5): 40 mg via SUBCUTANEOUS
  Filled 2012-03-31 (×5): qty 0.4

## 2012-03-31 MED ORDER — INSULIN ASPART 100 UNIT/ML ~~LOC~~ SOLN
0.0000 [IU] | Freq: Three times a day (TID) | SUBCUTANEOUS | Status: DC
  Administered 2012-04-02: 1 [IU] via SUBCUTANEOUS

## 2012-03-31 MED ORDER — FOLIC ACID 1 MG PO TABS
1.0000 mg | ORAL_TABLET | Freq: Two times a day (BID) | ORAL | Status: DC
  Administered 2012-03-31 – 2012-04-04 (×8): 1 mg via ORAL
  Filled 2012-03-31 (×9): qty 1

## 2012-03-31 MED ORDER — ONDANSETRON HCL 4 MG/2ML IJ SOLN: 4.0000 mg | Freq: Four times a day (QID) | INTRAMUSCULAR | Status: DC | PRN

## 2012-03-31 MED ORDER — ACETAMINOPHEN 325 MG PO TABS
650.0000 mg | ORAL_TABLET | ORAL | Status: DC | PRN
  Administered 2012-04-01: 650 mg via ORAL
  Filled 2012-03-31: qty 2

## 2012-03-31 MED ORDER — ACETAMINOPHEN 650 MG RE SUPP: 650.0000 mg | RECTAL | Status: DC | PRN

## 2012-03-31 MED ORDER — LEVOTHYROXINE SODIUM 100 MCG PO TABS
100.0000 ug | ORAL_TABLET | Freq: Every day | ORAL | Status: DC
  Administered 2012-04-01 – 2012-04-04 (×4): 100 ug via ORAL
  Filled 2012-03-31 (×5): qty 1

## 2012-03-31 NOTE — Progress Notes (Signed)
*  PRELIMINARY RESULTS* Vascular Ultrasound Carotid Duplex (Doppler) has been completed.   There is no obvious evidence of hemodynamically significant internal carotid artery stenosis >40%. Vertebral arteries are patent with antegrade flow.  03/31/2012 5:12 PM Gertie Fey, RDMS, RDCS

## 2012-03-31 NOTE — Care Management Note (Addendum)
CARE MANAGE MENT UTILIZATION REVIEW NOTE 03/31/2012     Patient:  [name], [name]   Account Number:  [id number]  Documented by:  Roxy Manns Saidee Geremia   Per Ur Regulation

## 2012-03-31 NOTE — Progress Notes (Signed)
  Echocardiogram 2D Echocardiogram has been performed.  Romie Keeble FRANCES 03/31/2012, 5:16 PM
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
## 2012-04-01 ENCOUNTER — Inpatient Hospital Stay (HOSPITAL_COMMUNITY): Payer: Medicare Other

## 2012-04-01 DIAGNOSIS — R112 Nausea with vomiting, unspecified: Secondary | ICD-10-CM | POA: Diagnosis present

## 2012-04-01 DIAGNOSIS — M353 Polymyalgia rheumatica: Secondary | ICD-10-CM | POA: Diagnosis present

## 2012-04-01 DIAGNOSIS — I639 Cerebral infarction, unspecified: Secondary | ICD-10-CM | POA: Diagnosis present

## 2012-04-01 DIAGNOSIS — I69393 Ataxia following cerebral infarction: Secondary | ICD-10-CM

## 2012-04-01 LAB — COMPREHENSIVE METABOLIC PANEL
Albumin: 3.7 g/dL (ref 3.5–5.2)
Alkaline Phosphatase: 40 U/L (ref 39–117)
BUN: 20 mg/dL (ref 6–23)
Calcium: 9.1 mg/dL (ref 8.4–10.5)
GFR calc Af Amer: 55 mL/min — ABNORMAL LOW (ref >90)
Potassium: 3.7 mEq/L (ref 3.5–5.1)
Total Protein: 6.6 g/dL (ref 6.0–8.3)

## 2012-04-01 LAB — GLUCOSE, CAPILLARY
Glucose-Capillary: 94 mg/dL (ref 70–99)
Glucose-Capillary: 99 mg/dL (ref 70–99)
Glucose-Capillary: 98 mg/dL (ref 70–99)

## 2012-04-01 LAB — LIPID PANEL
Cholesterol: 171 mg/dL (ref 0–200)
HDL: 40 mg/dL (ref >39)
Total CHOL/HDL Ratio: 4.3 RATIO
VLDL: 46 mg/dL — ABNORMAL HIGH (ref 0–40)

## 2012-04-01 NOTE — Evaluation (Signed)
Physical Therapy Evaluation Patient Details Name: [name] MRN: [id number] DOB: [birth year] Today's Date: 04/01/2012 Time: 4098-1191 PT Time Calculation (min): 27 min  PT Assessment / Plan / Recommendation Clinical Impression  Acute PT indicated to maximize functional mobility with LTG of returning home independent.    PT Assessment  Patient needs continued PT services    Follow Up Recommendations  CIR    Does the patient have the potential to tolerate intense rehabilitation      Barriers to Discharge None      Equipment Recommendations  Rolling walker with 5" wheels    Recommendations for Other Services Rehab consult   Frequency Min 4X/week    Precautions / Restrictions Precautions Precautions: Fall   Pertinent Vitals/Pain 0/10      Mobility  Bed Mobility Bed Mobility: Supine to Sit Supine to Sit: 3: Mod assist;With rails Transfers Transfers: Sit to Stand;Stand to Sit Sit to Stand: 4: Min assist;From bed;With upper extremity assist Stand to Sit: 4: Min assist;With armrests;To chair/3-in-1 Details for Transfer Assistance: verbal cues for sequencing, assist to control descent during stand to sit. Ambulation/Gait Ambulation/Gait Assistance: 4: Min assist Ambulation Distance (Feet): 30 Feet Assistive device: 1 person hand held assist Gait Pattern: Decreased stride length;Wide base of support Gait velocity: decreased    Exercises     PT Diagnosis: Abnormality of gait  PT Problem List: Decreased balance;Decreased mobility;Decreased coordination;Decreased safety awareness PT Treatment Interventions: DME instruction;Gait training;Stair training;Functional mobility training;Therapeutic activities;Therapeutic exercise;Balance training;Patient/family education   PT Goals Acute Rehab PT Goals PT Goal Formulation: With patient Time For Goal Achievement: 04/01/12 Potential to Achieve Goals: Good Pt will go Supine/Side to Sit: with modified independence;with HOB 0  degrees PT Goal: Supine/Side to Sit - Progress: Goal set today Pt will go Sit to Supine/Side: with modified independence;with HOB 0 degrees PT Goal: Sit to Supine/Side - Progress: Goal set today Pt will go Sit to Stand: with supervision;with upper extremity assist PT Goal: Sit to Stand - Progress: Goal set today Pt will go Stand to Sit: with supervision;with upper extremity assist PT Goal: Stand to Sit - Progress: Goal set today Pt will Ambulate: >150 feet;with supervision;with least restrictive assistive device PT Goal: Ambulate - Progress: Goal set today Pt will Go Up / Down Stairs: 3-5 stairs;with rail(s);with min assist PT Goal: Up/Down Stairs - Progress: Goal set today  Visit Information  Last PT Received On: 04/01/12 Assistance Needed: +1 PT/OT Co-Evaluation/Treatment: Yes    Subjective Data  Subjective: "I feel better." Patient Stated Goal: home   Prior Functioning  Home Living Lives With: Son Available Help at Discharge: Available 24 hours/day Type of Home: House Home Access: Stairs to enter [employer] of Steps: 5 Entrance Stairs-Rails: Right;Left;Can reach both Home Layout: One level Bathroom Shower/Tub: Health visitor: Standard Home Adaptive Equipment: Straight cane;Walker - rolling;Wheelchair - manual Prior Function Level of Independence: Independent Able to Take Stairs?: Yes Driving: Yes Vocation: Retired Musician: Expressive difficulties Dominant Hand: Right    Cognition  Cognition Overall Cognitive Status: Appears within functional limits for tasks assessed/performed Arousal/Alertness: Awake/alert Orientation Level: Appears intact for tasks assessed Behavior During Session: [facility] for tasks performed    Extremity/Trunk Assessment Right Lower Extremity Assessment RLE ROM/Strength/Tone: WFL for tasks assessed RLE Sensation: WFL - Light Touch Left Lower Extremity Assessment LLE ROM/Strength/Tone: WFL for  tasks assessed LLE Sensation: WFL - Light Touch   Balance Balance Balance Assessed: Yes Static Standing Balance Static Standing - Balance Support: Right upper extremity  supported Static Standing - Level of Assistance: 5: Stand by assistance Dynamic Standing Balance Dynamic Standing - Balance Support: Right upper extremity supported Dynamic Standing - Level of Assistance: 4: Min assist  End of Session PT - End of Session Equipment Utilized During Treatment: Gait belt Activity Tolerance: Patient tolerated treatment well Patient left: in chair;with call bell/phone within reach;with nursing in room  GP     Ilda Foil 04/01/2012, 2:33 PM  Aida Raider, PT  Office # [phone] Pager [phone]

## 2012-04-01 NOTE — Progress Notes (Signed)
Subjective: Nausea and vomitting have improved.  Had trouble feeding herself due to ataxia.  Feels unsteady when trying to grasp something.  Some speech issues.  Objective: Vital signs in last 24 hours: Temp:  [97.3 F (36.3 C)-98.1 F (36.7 C)] 97.5 F (36.4 C) (02/08 0954) Pulse Rate:  [67-94] 68 (02/08 0954) Resp:  [18-20] 20 (02/08 0954) BP: (97-150)/(50-85) 122/52 mmHg (02/08 0954) SpO2:  [92 %-98 %] 92 % (02/08 0954) Weight:  [95.255 kg (210 lb)] 95.255 kg (210 lb) (02/07 1553) Weight change:  Last BM Date: 03/31/12  CBG (last 3)   Recent Labs  03/31/12 2149 04/01/12 0647 04/01/12 1135  GLUCAP 133* 120* 94    Intake/Output from previous day: 02/07 0701 - 02/08 0700 In: 512.7 [I.V.:512.7] Out: -  Intake/Output this shift: Total I/O In: 260 [P.O.:260] Out: -   General appearance: alert and no distress Eyes: no scleral icterus Throat: oropharynx moist without erythema Resp: clear to auscultation bilaterally Cardio: regular rate and rhythm GI: soft, non-tender; bowel sounds normal; no masses,  no organomegaly Extremities: no clubbing, cyanosis or edema Neuro: mild dysarthria and occasional word finding issues; CN intact; finger-nose ataxia; difficulty with rapid alternating movements; gait not tested; motor 5/5; DTRs 2+ symmetric.   Lab Results:  Recent Labs  04/01/12 0730  NA 139  K 3.7  CL 101  CO2 27  GLUCOSE 120*  BUN 20  CREATININE 1.10  CALCIUM 9.1    Recent Labs  04/01/12 0730  AST 13  ALT 14  ALKPHOS 40  BILITOT 0.4  PROT 6.6  ALBUMIN 3.7    Recent Labs  03/31/12 1619  WBC 8.1  HGB 15.5*  HCT 45.6  MCV 93.8  PLT 209   Lab Results  Component Value Date   INR 0.98 03/27/2009   INR 0.94 02/28/2009     Recent Labs  03/31/12 1619  TSH 1.379   Most recent A1C- 5.9  Total Cholesterol 171, Triglycerides 230, HDL 40, LDL 85  Studies/Results: Dg Chest 2 View  04/01/2012  *RADIOLOGY REPORT*  Clinical Data: Nausea and emesis  for 2 days.  CHEST - 2 VIEW  Comparison: 04/28/2010  Findings: Fibrosis or linear atelectasis in the left lung base. Probable emphysematous changes in the lungs. The heart size and pulmonary vascularity are normal. The lungs appear clear and expanded without focal air space disease or consolidation. No blunting of the costophrenic angles.  No pneumothorax.  Mediastinal contours appear intact.  No significant change since previous study.  IMPRESSION: Emphysematous changes.  Fibrosis or linear atelectasis in the left lung base.  Stable examination.  No evidence of active pulmonary disease.   Original Report Authenticated By: Burman Nieves, M.D.    Dg Abd 1 View  04/01/2012  *RADIOLOGY REPORT*  Clinical Data: Nausea and emesis for 2 days.  ABDOMEN - 1 VIEW  Comparison: CT abdomen and pelvis 04/29/2010  Findings: Scattered gas and stool in the colon.  Scattered gas in nondistended small bowel.  There is a loop of bowel in the left lower quadrant probably representing colon appears to have some wall thickening.  Inflammatory or infiltrative process is not excluded.  No small or large bowel distension.  No radiopaque stones.  Degenerative changes in the spine and hips.  Vascular calcifications.  IMPRESSION: Nonobstructive bowel gas pattern although a suggestion of wall thickening and left lower quadrant bowel may suggest inflammatory or infectious colitis.   Original Report Authenticated By: Burman Nieves, M.D.    Mr Brain  Wo Contrast  03/31/2012  *RADIOLOGY REPORT*  Clinical Data: Slurred speech.  Right-sided weakness.  MRI HEAD WITHOUT CONTRAST  Technique:  Multiplanar, multiecho pulse sequences of the brain and surrounding structures were obtained according to standard protocol without intravenous contrast.  Comparison: 06/09/2009  Findings: There is a 3 cm acute infarction within the right cerebellum.  There is mild swelling but no hemorrhage, mass effect or shift.  There are extensive chronic small vessel  changes throughout the pons.  The cerebral hemispheres show atrophy with extensive chronic small vessel disease throughout the white matter.  No hydrocephalus.  No extra-axial collection.  No sign of mass lesion. No pituitary mass.  Sinuses, middle ears and mastoids are clear except for small amount of fluid in the right mastoid tip.  IMPRESSION: 3 cm acute infarction affecting the right cerebellum.  No mass effect or hemorrhage.  Extensive chronic small vessel changes elsewhere throughout the brain.  I discussed the case with Dr. Timothy Lasso at to 1435 hours.   Original Report Authenticated By: Paulina Fusi, M.D.    Carotid Duplex (Doppler)-  There is no obvious evidence of hemodynamically significant internal carotid artery stenosis >40%. Vertebral arteries are patent with antegrade flow.    Medications: Scheduled: . aspirin  325 mg Oral Daily  . atorvastatin  40 mg Oral q1800  . benazepril  20 mg Oral Daily  . cholecalciferol  5,000 Units Oral Daily  . donepezil  5 mg Oral QHS  . enoxaparin (LOVENOX) injection  40 mg Subcutaneous Q24H  . folic acid  1 mg Oral BID  . insulin aspart  0-9 Units Subcutaneous TID WC  . levothyroxine  100 mcg Oral QAC breakfast  . predniSONE  4 mg Oral Q breakfast  . sertraline  100 mg Oral Daily  . vitamin B-12  1,000 mcg Oral Daily   Continuous: . sodium chloride 40 mL/hr at 03/31/12 1811    Assessment/Plan: Principal Problem: 1. Cerebellar stroke, acute- reviewed diagnosis with patient and son.  Primary manifestation is ataxia.  Echo pending; carotid dopplers negative for stenosis.  MRA ordered.  Awaiting PT/OT evaluation.  Continue ST.  Will continue risk factor modification with DM2, BP, lipid control.  She was not on an aspirin so will continue ASA for secondary stroke prevention.  2. Ataxia following cerebral infarction- PT/OT evaluation pending.  Active Problems: 3. Hypertension- excellent control on home meds.  Monitor for hypotension. 4.  Hyperlipidemia- LDL at goal less than 100 (ideal less than 70) on Crestor at home.   5. Obstructive sleep apnea- has declined CPAP. 6. Diabetes mellitus- well controlled recently.  Metformin on hold.   7. Nausea and vomiting- resolved- secondary to ataxia. 8. Polymyalgia rheumatica- continue steroids. 9. Disposition- anticipate discharge tomorrow if stable following workup and PT/OT recommend outpatient or Home Health therapies.  Lives with son.     LOS: 1 day   Jayshon Dommer,W DOUGLAS 04/01/2012, 12:32 PM

## 2012-04-01 NOTE — Evaluation (Signed)
Occupational Therapy Evaluation Patient Details Name: [name] MRN: [id number] DOB: 02-17-1935 Today's Date: 04/01/2012 Time: 1884-1660 OT Time Calculation (min): 26 min  OT Assessment / Plan / Recommendation Clinical Impression  Pt admitted with n/v and dizziness. MRI findings show acute infarct in right cerebellum.  Pt will benefit from acute OT services to address below problem list.  Recommending CIR to further progress rehab to ensure safe return home.    OT Assessment  Patient needs continued OT Services    Follow Up Recommendations  CIR    Barriers to Discharge      Equipment Recommendations   (tbd)    Recommendations for Other Services Rehab consult  Frequency  Min 3X/week    Precautions / Restrictions Precautions Precautions: Fall   Pertinent Vitals/Pain See vitals    ADL  Grooming: Performed;Brushing hair;Set up Where Assessed - Grooming: Unsupported sitting Upper Body Bathing: Simulated;Set up Where Assessed - Upper Body Bathing: Unsupported sitting Lower Body Bathing: Simulated;Min guard Where Assessed - Lower Body Bathing: Unsupported sitting Upper Body Dressing: Performed;Set up Where Assessed - Upper Body Dressing: Unsupported sitting Lower Body Dressing: Simulated;Min guard Where Assessed - Lower Body Dressing: Unsupported sitting Toilet Transfer: Simulated;Minimal assistance Toilet Transfer Method: Sit to stand Toilet Transfer Equipment:  (bed to chair) Equipment Used: Gait belt Transfers/Ambulation Related to ADLs: min assist with HHA x1.  Increased time and wide base of support.  ADL Comments: Pt demonstrating balance deficits during functional mobility.  Reports that she feels "all turned around" when up on her feet but denies dizziness.    OT Diagnosis: Generalized weakness (balance deficits)  OT Problem List: Impaired balance (sitting and/or standing);Decreased activity tolerance;Decreased knowledge of use of DME or AE;Obesity;Impaired UE  functional use OT Treatment Interventions: Self-care/ADL training;DME and/or AE instruction;Therapeutic activities;Patient/family education;Balance training   OT Goals Acute Rehab OT Goals OT Goal Formulation: With patient/family Time For Goal Achievement: 04/08/12 Potential to Achieve Goals: Good ADL Goals Pt Will Perform Grooming: with modified independence;Standing at sink (3 tasks; using RUE) ADL Goal: Grooming - Progress: Goal set today Pt Will Transfer to Toilet: with modified independence;Ambulation;Comfort height toilet;Regular height toilet ADL Goal: Toilet Transfer - Progress: Goal set today Pt Will Perform Toileting - Clothing Manipulation: with modified independence;Standing ADL Goal: Toileting - Clothing Manipulation - Progress: Goal set today Pt Will Perform Toileting - Hygiene: with modified independence;Sit to stand from 3-in-1/toilet ADL Goal: Toileting - Hygiene - Progress: Goal set today Miscellaneous OT Goals Miscellaneous OT Goal #1: Pt will performing full bathing/dressing ADLs at mod I level. OT Goal: Miscellaneous Goal #1 - Progress: Goal set today Miscellaneous OT Goal #2: Pt will perform dynamic standing balance task >5 min at mod I level as precursor for ADL retraining. OT Goal: Miscellaneous Goal #2 - Progress: Goal set today  Visit Information  Last OT Received On: 04/01/12 Assistance Needed: +1 PT/OT Co-Evaluation/Treatment: Yes    Subjective Data      Prior Functioning     Home Living Lives With: Son Available Help at Discharge: Available 24 hours/day Type of Home: House Home Access: Stairs to enter [employer] of Steps: 5 Entrance Stairs-Rails: Right;Left;Can reach both Home Layout: One level Bathroom Shower/Tub: Health visitor: Standard Home Adaptive Equipment: Straight cane;Walker - rolling;Wheelchair - manual Additional Comments: Son is currently home 24/7 as he is out of work.  However, he will need to go to  interviews and accept a job as it becomes available and will therefore need to leave pt  alone. Prior Function Level of Independence: Independent Able to Take Stairs?: Yes Driving: Yes Vocation: Retired Musician: Expressive difficulties Dominant Hand: Right         Vision/Perception Vision - History Patient Visual Report: No change from baseline   Cognition  Cognition Overall Cognitive Status: Appears within functional limits for tasks assessed/performed Arousal/Alertness: Awake/alert Orientation Level: Appears intact for tasks assessed Behavior During Session: [facility] for tasks performed    Extremity/Trunk Assessment Right Upper Extremity Assessment RUE ROM/Strength/Tone: WFL for tasks assessed RUE Sensation: WFL - Light Touch;WFL - Proprioception RUE Coordination: Deficits RUE Coordination Deficits: decreased accuracy and increased time for gross motor movement Left Upper Extremity Assessment LUE ROM/Strength/Tone: WFL for tasks assessed LUE Sensation: WFL - Light Touch;WFL - Proprioception LUE Coordination: WFL - gross/fine motor Right Lower Extremity Assessment RLE ROM/Strength/Tone: WFL for tasks assessed RLE Sensation: WFL - Light Touch Left Lower Extremity Assessment LLE ROM/Strength/Tone: WFL for tasks assessed LLE Sensation: WFL - Light Touch     Mobility Bed Mobility Bed Mobility: Supine to Sit Supine to Sit: 3: Mod assist;With rails Transfers Transfers: Sit to Stand;Stand to Sit Sit to Stand: 4: Min assist;From bed;With upper extremity assist Stand to Sit: 4: Min assist;With armrests;To chair/3-in-1 Details for Transfer Assistance: verbal cues for sequencing, assist to control descent during stand to sit.     Exercise     Balance Balance Balance Assessed: Yes Static Standing Balance Static Standing - Balance Support: Right upper extremity supported Static Standing - Level of Assistance: 5: Stand by assistance Dynamic Standing  Balance Dynamic Standing - Balance Support: Right upper extremity supported Dynamic Standing - Level of Assistance: 4: Min assist   End of Session OT - End of Session Equipment Utilized During Treatment: Gait belt Activity Tolerance: Patient tolerated treatment well Patient left: in chair;with call bell/phone within reach;with family/visitor present Nurse Communication: Mobility status  GO    04/01/2012 Cipriano Mile OTR/L Pager [phone] Office [phone]  Cipriano Mile 04/01/2012, 4:59 PM

## 2012-04-01 NOTE — Evaluation (Signed)
Speech Language Pathology Evaluation Patient Details Name: [name] MRN: [id number] DOB: 1934/05/10 Today's Date: 04/01/2012 Time: 5284-1324 SLP Time Calculation (min): 40 min  Problem List:  Patient Active Problem List  Diagnosis  . Bifascicular block  . Hypertension  . Hyperlipidemia  . Obstructive sleep apnea  . Diabetes mellitus  . Cerebellar stroke, acute  . Ataxia following cerebral infarction  . Nausea and vomiting  . Polymyalgia rheumatica   Past Medical History:  Past Medical History  Diagnosis Date  . Hyperlipidemia   . Hypertension   . Obstructive sleep apnea   . Bifascicular block   . Diabetes mellitus   . Thyroid disease   . Hypothyroidism   . Gastric ulcer   . Squamous cell skin cancer   . Shortness of breath   . Stroke 03/31/2012  . GERD (gastroesophageal reflux disease)   . Kidney stones     HX OF  . Arthritis    Past Surgical History:  Past Surgical History  Procedure Laterality Date  . Total knee arthroplasty      Left knee  . Tonsillectomy    . Appendectomy    . Hernia repair    . Rotator cuff repair      RT SHOULDER  . Wrist fracture surgery     HPI:  78 yo female adm to [facility] with dizziness and speech changes.  Pt found to have cerebellum infart per MRI.     Assessment / Plan / Recommendation Clinical Impression  Pt presents with mild ataxic dysarthria resulting in imprecise articulation and rapid rate of speech impacting speech at multisyllabic word and sentence level.  Pt is amenable to verbal cues to slow rate with significant improvement in speech intelligibity.  Mild word finding deficits also present, as well as baseline mild memory impairments.  Pt reports baseline dementia diagnosis.    SLP recommends follow up in this venue to maximize pt's functional communication.  Provided pt and daughter [name] with compensatory strategies for dysarthria and word finding tasks to complete over the weekend.      SLP Assessment  Patient  needs continued Speech Lanaguage Pathology Services    Follow Up Recommendations  Other (comment) (TBD)    Frequency and Duration min 2x/week  2 weeks      SLP Goals  SLP Goals Potential to Achieve Goals: Good Potential Considerations: Previous level of function SLP Goal #1: Pt will demonstrate strategies to improve speech intelligibility to 80% at simple conversation level with mod independence.  SLP Goal #2: Pt will demonstrate ability to manage home tasks (eg: medicine, bill, appt tasks) with mod independence.  SLP Goal #3: Pt will demonstrate word finding strategy usages with mod assist and 80% accuracy during communication breakdowns.    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  Overall Cognitive Status: Other (comment) (pt reports recently being told she has dementia, on aricept ) Arousal/Alertness: Awake/alert Orientation Level: Oriented X4 Attention: Selective Selective Attention: Appears intact Memory: Impaired Memory Impairment: Storage deficit;Retrieval deficit;Decreased recall of new information (1/4 Ind 1/4 Cat cue 2/4 m/c cue score 7/12) Awareness: Appears intact Problem Solving: Appears intact Safety/Judgment: Appears intact    Comprehension  Auditory Comprehension Overall Auditory Comprehension: Appears within functional limits for tasks assessed Commands: Within Functional Limits Conversation: Complex Reading Comprehension Reading Status: Within funtional limits (pt reports reading at baseline,read paragraph  in stroke book) Paragraph Level:  (slow reading but pt reports baseline) Effective Techniques: Eye glasses (wears glasses)    Expression Expression Primary Mode of Expression: Verbal Verbal Expression Overall Verbal Expression: Impaired (baseline dementia but word findings  mildly worse now per pt) Initiation: No impairment Level of Generative/Spontaneous Verbalization: Conversation Repetition: No impairment Naming: No impairment Confrontation: Within functional limits Other Naming Comments: word generation 8 animals named in 60 seconds Pragmatics: No impairment Interfering Components: Premorbid deficit Effective Techniques:  (pauses before speaking to gather thoughts) Non-Verbal Means of Communication: Not applicable Other Verbal Expression Comments: minimal word finding deficits resulting in intermitent dysfluencies Written Expression Dominant Hand: Right Written Expression: Not tested (pt reports writing worse than baseline)   Oral / Motor Oral Motor/Sensory Function Overall Oral Motor/Sensory Function: Other (comment) (slight lingual tip dev LT, decr LT facial intermittent seen) Motor Speech Respiration: Impaired Level of Impairment: Sentence (? decr breath control) Phonation: Low vocal intensity (per pt and dtr phon strength decreased) Articulation: Impaired Level of Impairment: Sentence (and multisyllabic words) Intelligibility: Intelligibility reduced Word: 75-100% accurate Phrase: 75-100% accurate Sentence: 50-74% accurate (rapid rate of speech, imprecise artic, Nurse, mental health) Conversation: 50-74% accurate Motor Planning: Witnin functional limits Motor Speech Errors: Not applicable Effective Techniques: Slow rate;Over-articulate   GO     Donavan Burnet, MS [facility] SLP [phone]

## 2012-04-02 LAB — COMPREHENSIVE METABOLIC PANEL WITH GFR
ALT: 13 U/L (ref 0–35)
AST: 12 U/L (ref 0–37)
Albumin: 3.6 g/dL (ref 3.5–5.2)
Alkaline Phosphatase: 40 U/L (ref 39–117)
BUN: 23 mg/dL (ref 6–23)
CO2: 26 meq/L (ref 19–32)
Calcium: 9.2 mg/dL (ref 8.4–10.5)
Chloride: 103 meq/L (ref 96–112)
Creatinine, Ser: 1.15 mg/dL — ABNORMAL HIGH (ref 0.50–1.10)
GFR calc Af Amer: 53 mL/min — ABNORMAL LOW (ref >90)
GFR calc non Af Amer: 45 mL/min — ABNORMAL LOW (ref >90)
Glucose, Bld: 114 mg/dL — ABNORMAL HIGH (ref 70–99)
Potassium: 3.9 meq/L (ref 3.5–5.1)
Sodium: 139 meq/L (ref 135–145)
Total Bilirubin: 0.3 mg/dL (ref 0.3–1.2)
Total Protein: 6.7 g/dL (ref 6.0–8.3)

## 2012-04-02 LAB — GLUCOSE, CAPILLARY
Glucose-Capillary: 105 mg/dL — ABNORMAL HIGH (ref 70–99)
Glucose-Capillary: 135 mg/dL — ABNORMAL HIGH (ref 70–99)
Glucose-Capillary: 115 mg/dL — ABNORMAL HIGH (ref 70–99)
Glucose-Capillary: 104 mg/dL — ABNORMAL HIGH (ref 70–99)

## 2012-04-02 NOTE — Progress Notes (Signed)
Subjective: Feels like there has been some improvement in coordination.  Still has a little trouble putting straw to mouth.    Objective: Vital signs in last 24 hours: Temp:  [97.2 F (36.2 C)-98 F (36.7 C)] 97.4 F (36.3 C) (02/09 0529) Pulse Rate:  [61-74] 64 (02/09 0529) Resp:  [18-20] 20 (02/09 0529) BP: (101-159)/(52-91) 159/91 mmHg (02/09 0529) SpO2:  [92 %-95 %] 94 % (02/09 0529) Weight change:  Last BM Date: 03/31/12  CBG (last 3)   Recent Labs  04/01/12 1632 04/01/12 2144 04/02/12 0710  GLUCAP 99 98 105*    Intake/Output from previous day: 02/08 0701 - 02/09 0700 In: 260 [P.O.:260] Out: -  Intake/Output this shift:    General appearance: alert and no distress Eyes: no scleral icterus Throat: oropharynx moist without erythema Resp: clear to auscultation bilaterally Cardio: regular rate and rhythm, S1, S2 normal, no murmur, click, rub or gallop GI: soft, non-tender; bowel sounds normal; no masses,  no organomegaly Extremities: no clubbing, cyanosis or edema Neurologic: minimal word finding difficulties; finger-nose improved though slightly ataxic.  Rapid alternating movements better; strength intact.   Lab Results:  Recent Labs  04/01/12 0730 04/02/12 0612  NA 139 139  K 3.7 3.9  CL 101 103  CO2 27 26  GLUCOSE 120* 114*  BUN 20 23  CREATININE 1.10 1.15*  CALCIUM 9.1 9.2    Recent Labs  04/01/12 0730 04/02/12 0612  AST 13 12  ALT 14 13  ALKPHOS 40 40  BILITOT 0.4 0.3  PROT 6.6 6.7  ALBUMIN 3.7 3.6    Recent Labs  03/31/12 1619  WBC 8.1  HGB 15.5*  HCT 45.6  MCV 93.8  PLT 209   Lab Results  Component Value Date   INR 0.98 03/27/2009   INR 0.94 02/28/2009   No results found for this basename: CKTOTAL, CKMB, CKMBINDEX, TROPONINI,  in the last 72 hours  Recent Labs  03/31/12 1619  TSH 1.379   No results found for this basename: VITAMINB12, FOLATE, FERRITIN, TIBC, IRON, RETICCTPCT,  in the last 72 hours  Studies/Results: Dg  Chest 2 View  04/01/2012  *RADIOLOGY REPORT*  Clinical Data: Nausea and emesis for 2 days.  CHEST - 2 VIEW  Comparison: 04/28/2010  Findings: Fibrosis or linear atelectasis in the left lung base. Probable emphysematous changes in the lungs. The heart size and pulmonary vascularity are normal. The lungs appear clear and expanded without focal air space disease or consolidation. No blunting of the costophrenic angles.  No pneumothorax.  Mediastinal contours appear intact.  No significant change since previous study.  IMPRESSION: Emphysematous changes.  Fibrosis or linear atelectasis in the left lung base.  Stable examination.  No evidence of active pulmonary disease.   Original Report Authenticated By: Burman Nieves, M.D.    Dg Abd 1 View  04/01/2012  *RADIOLOGY REPORT*  Clinical Data: Nausea and emesis for 2 days.  ABDOMEN - 1 VIEW  Comparison: CT abdomen and pelvis 04/29/2010  Findings: Scattered gas and stool in the colon.  Scattered gas in nondistended small bowel.  There is a loop of bowel in the left lower quadrant probably representing colon appears to have some wall thickening.  Inflammatory or infiltrative process is not excluded.  No small or large bowel distension.  No radiopaque stones.  Degenerative changes in the spine and hips.  Vascular calcifications.  IMPRESSION: Nonobstructive bowel gas pattern although a suggestion of wall thickening and left lower quadrant bowel may suggest inflammatory or  infectious colitis.   Original Report Authenticated By: Burman Nieves, M.D.    Mr Brain Wo Contrast  03/31/2012  *RADIOLOGY REPORT*  Clinical Data: Slurred speech.  Right-sided weakness.  MRI HEAD WITHOUT CONTRAST  Technique:  Multiplanar, multiecho pulse sequences of the brain and surrounding structures were obtained according to standard protocol without intravenous contrast.  Comparison: 06/09/2009  Findings: There is a 3 cm acute infarction within the right cerebellum.  There is mild swelling but no  hemorrhage, mass effect or shift.  There are extensive chronic small vessel changes throughout the pons.  The cerebral hemispheres show atrophy with extensive chronic small vessel disease throughout the white matter.  No hydrocephalus.  No extra-axial collection.  No sign of mass lesion. No pituitary mass.  Sinuses, middle ears and mastoids are clear except for small amount of fluid in the right mastoid tip.  IMPRESSION: 3 cm acute infarction affecting the right cerebellum.  No mass effect or hemorrhage.  Extensive chronic small vessel changes elsewhere throughout the brain.  I discussed the case with Dr. Timothy Lasso at to 1435 hours.   Original Report Authenticated By: Paulina Fusi, M.D.    Mr Mra Head/brain Wo Cm  04/01/2012  *RADIOLOGY REPORT*  Clinical Data: Right cerebellar stroke  MRA HEAD WITHOUT CONTRAST  Technique: Angiographic images of the Circle of Willis were obtained using MRA technique without intravenous contrast.  Comparison: MRI brain 03/31/2012  Findings: Fetal origin of the posterior cerebral artery with hypoplastic posterior circulation.  Right vertebral artery is patent to the basilar.  The basilar is small and the has a high- grade stenosis in the mid to distal portion.  Some this may be due to congenital hypoplasia versus atherosclerotic disease.  Left vertebral artery ends in pica and does not contribute to the basilar.  The basilar terminates in the superior cerebellar arteries. Small right cerebellar artery is   patent proximally with the distal vessel not seen.  Left superior cerebellar artery is difficult to see and may be very small or diseased.  Fetal origin of the posterior cerebral artery bilaterally with hypoplastic distal basilar and P1 segments.  Internal carotid artery is patent bilaterally.  Anterior and middle cerebral arteries are patent bilaterally without stenosis. Negative for aneurysm.  IMPRESSION: Fetal origin of the posterior serve cerebral arteries with hypoplastic vertebral  basilar system.  There is a very small distal basilar artery which could be due to atherosclerotic disease or congenital hypoplasia.  With regard to the patient's right superior cerebellar infarct, the right superior cerebral artery is patent proximally.   Original Report Authenticated By: Janeece Riggers, M.D.    Echocardiogram (03/31/12)- The cavity size was normal. Wall thickness was normal. Systolic function was normal. The estimated ejection fraction was in the range of 55% to 60%. Wall motion was normal; there were no regional wall motion abnormalities. Doppler parameters are consistent with abnormal left ventricular relaxation (grade 1 diastolic dysfunction).   Carotid Duplex (Doppler) (03/31/12)- There is no obvious evidence of hemodynamically significant internal carotid artery stenosis >40%. Vertebral arteries are patent with antegrade flow.     Medications: Scheduled: . aspirin  325 mg Oral Daily  . atorvastatin  40 mg Oral q1800  . benazepril  20 mg Oral Daily  . cholecalciferol  5,000 Units Oral Daily  . donepezil  5 mg Oral QHS  . enoxaparin (LOVENOX) injection  40 mg Subcutaneous Q24H  . folic acid  1 mg Oral BID  . insulin aspart  0-9 Units Subcutaneous TID  WC  . levothyroxine  100 mcg Oral QAC breakfast  . predniSONE  4 mg Oral Q breakfast  . sertraline  100 mg Oral Daily  . vitamin B-12  1,000 mcg Oral Daily   Continuous: . sodium chloride 40 mL/hr at 03/31/12 1811    Assessment/Plan: Principal Problem:  1. Cerebellar stroke, acute- MRA shows no critical stenosis or need for additional intervention.  Echo reviewed. Will continue risk factor modification with DM2, BP, lipid control. She was not on an aspirin so will continue ASA for secondary stroke prevention.  Continue PT/OT- [facility] consult for intensive rehab. 2. Ataxia following cerebral infarction- Improved slightly.  Continue PT/OT, fall precautions.  Active Problems:  3. Hypertension- adequate  control on home meds. Consider additional treatment if BP remains above 150/90.  4. Hyperlipidemia- LDL at goal less than 100 (ideal less than 70) on Crestor at home.  5. Obstructive sleep apnea- has declined CPAP.  6. Diabetes mellitus- well controlled recently. Metformin on hold.  7. Nausea and vomiting- resolved- secondary to ataxia.  8. Polymyalgia rheumatica- continue steroids.  9. Disposition- CIR consult for discharge tomorrow if approved.  Saline lock IV.   LOS: 2 days   Audley Hinojos,W DOUGLAS 04/02/2012, 9:29 AM

## 2012-04-03 LAB — GLUCOSE, CAPILLARY
Glucose-Capillary: 104 mg/dL — ABNORMAL HIGH (ref 70–99)
Glucose-Capillary: 116 mg/dL — ABNORMAL HIGH (ref 70–99)
Glucose-Capillary: 136 mg/dL — ABNORMAL HIGH (ref 70–99)

## 2012-04-03 MED ORDER — METFORMIN HCL 500 MG PO TABS
500.0000 mg | ORAL_TABLET | Freq: Every day | ORAL | Status: DC
  Administered 2012-04-03 – 2012-04-04 (×2): 500 mg via ORAL
  Filled 2012-04-03 (×3): qty 1

## 2012-04-03 NOTE — Progress Notes (Signed)
Physical Therapy Treatment Patient Details Name: LIZVETTE LIGHTSEY MRN: [id number] DOB: 06/30/1934 Today's Date: 04/03/2012 Time: 3244-0102 PT Time Calculation (min): 27 min  PT Assessment / Plan / Recommendation Comments on Treatment Session  Pt progressing well but remains to have significant balance impairments, decreased activity tolerance and mild cognitive impairments. Pt admitted to having memory problems since her CVA.Pt to con't ot benefit from CIR to achieve modified independent function for safe transition home due to not having 24/7 supervisio/assist at home.    Follow Up Recommendations  CIR     Does the patient have the potential to tolerate intense rehabilitation     Barriers to Discharge        Equipment Recommendations       Recommendations for Other Services Rehab consult  Frequency Min 4X/week   Plan Discharge plan remains appropriate;Frequency remains appropriate    Precautions / Restrictions Precautions Precautions: Fall Restrictions Weight Bearing Restrictions: No   Pertinent Vitals/Pain 2/10 R LE pain    Mobility  Bed Mobility Bed Mobility: Not assessed Supine to Sit: 6: Modified independent (Device/Increase time) Sitting - Scoot to Edge of Bed: 6: Modified independent (Device/Increase time) Transfers Transfers: Sit to Stand;Stand to Sit Sit to Stand: 4: Min assist;With upper extremity assist;From chair/3-in-1 Stand to Sit: 5: Supervision;With upper extremity assist;To chair/3-in-1 Details for Transfer Assistance: increased time sit -->stand, v/c's to control descent to chair Ambulation/Gait Ambulation/Gait Assistance: 4: Min assist Ambulation Distance (Feet): 200 Feet Assistive device: Rolling walker;1 person hand held assist;None Ambulation/Gait Assistance Details: pt with antalgic gait when amb without device. trialed RW however pt with unsafe walker management due to inability to focus and decreased desire to use RW. With onset of fatigue pt  became unsteady when amb without AD and had R knee instabilty/mild buckling requiring use of R hand held assist. Pt did reports she holds onto the wall alot at home. Gait Pattern: Step-through pattern;Decreased stride length;Antalgic;Wide base of support Gait velocity: slow General Gait Details: pt most stable with hand held assist due to pt's unsafe use of RW Stairs: Yes Stairs Assistance: 4: Min assist Stairs Assistance Details (indicate cue type and reason): pt with strong use of railing and hand held assist, v/c's for sequencing and to complete non-reciprocal pattern Stair Management Technique: One rail Left;Forwards (1 person HHA) Number of Stairs: 3 Modified Rankin (Stroke Patients Only) Pre-Morbid Rankin Score: No significant disability Modified Rankin: Moderately severe disability    Exercises     PT Diagnosis:    PT Problem List:   PT Treatment Interventions:     PT Goals Acute Rehab PT Goals PT Goal: Sit to Stand - Progress: Progressing toward goal PT Goal: Stand to Sit - Progress: Progressing toward goal PT Goal: Ambulate - Progress: Progressing toward goal PT Goal: Up/Down Stairs - Progress: Progressing toward goal  Visit Information  Last PT Received On: 04/03/12 Assistance Needed: +1 PT/OT Co-Evaluation/Treatment: Yes    Subjective Data  Subjective: Pt received sitting up in chair agreeable to PT.   Cognition  Cognition Overall Cognitive Status: Appears within functional limits for tasks assessed/performed however pt does report "Its really hard to remember things now." Arousal/Alertness: Awake/alert Orientation Level: Appears intact for tasks assessed Behavior During Session: [facility] for tasks performed Cognition - Other Comments: pt easily distracted and with delayed processing/increased response time    Balance  Standardized Balance Assessment Standardized Balance Assessment: Dynamic Gait Index Dynamic Gait Index Level Surface: Mild Impairment Change in  Gait Speed: Moderate Impairment Gait  with Horizontal Head Turns: Moderate Impairment Gait with Vertical Head Turns: Moderate Impairment Gait and Pivot Turn: Moderate Impairment Step Over Obstacle: Moderate Impairment Step Around Obstacles: Mild Impairment Steps: Moderate Impairment Total Score: 10  End of Session PT - End of Session Equipment Utilized During Treatment: Gait belt Activity Tolerance: Patient tolerated treatment well Patient left: in chair;with call bell/phone within reach;with family/visitor present Nurse Communication: Mobility status   GP     Marcene Brawn 04/03/2012, 12:57 PM  Lewis Shock, PT, DPT Pager #: [phone] Office #: [phone]

## 2012-04-03 NOTE — Progress Notes (Signed)
Patient has been stable throughout the shift, ambulated with PT and no new complains at this time. Will continue to monitor.

## 2012-04-03 NOTE — Progress Notes (Signed)
Occupational Therapy Treatment Patient Details Name: [name] MRN: [id number] DOB: September 02, 1934 Today's Date: 04/03/2012 Time: 4098-1191 OT Time Calculation (min): 26 min  OT Assessment / Plan / Recommendation Comments on Treatment Session Pt is not experiencing any further dizziness.  Her chief complaint is R LE pain of which she has had for several weeks, thus continuing to require min assist to ambulate.      Follow Up Recommendations       Barriers to Discharge       Equipment Recommendations       Recommendations for Other Services    Frequency Min 3X/week   Plan Discharge plan remains appropriate    Precautions / Restrictions Precautions Precautions: Fall Restrictions Weight Bearing Restrictions: No   Pertinent Vitals/Pain R LE, did not rate, repositioned    ADL  Grooming: Brushing hair;Teeth care;Wash/dry hands;Supervision/safety Where Assessed - Grooming: Unsupported standing Toilet Transfer: Radiographer, therapeutic Method: Sit to Barista: Comfort height toilet Toileting - Clothing Manipulation and Hygiene: Modified independent Where Assessed - Toileting Clothing Manipulation and Hygiene: Sit to stand from 3-in-1 or toilet Transfers/Ambulation Related to ADLs: min assist with HHA x1.  Increased time and wide base of support.  ADL Comments: Denies dizziness, feels the pain in her R leg is making her off-balance.    OT Diagnosis:    OT Problem List:   OT Treatment Interventions:     OT Goals ADL Goals Pt Will Perform Grooming: with modified independence;Standing at sink ADL Goal: Grooming - Progress: Progressing toward goals Pt Will Transfer to Toilet: with modified independence;Ambulation;Comfort height toilet;Regular height toilet ADL Goal: Toilet Transfer - Progress: Progressing toward goals Pt Will Perform Toileting - Clothing Manipulation: with modified independence;Standing ADL Goal: Toileting - Clothing  Manipulation - Progress: Met Pt Will Perform Toileting - Hygiene: with modified independence;Sit to stand from 3-in-1/toilet ADL Goal: Toileting - Hygiene - Progress: Met Miscellaneous OT Goals Miscellaneous OT Goal #1: Pt will performing full bathing/dressing ADLs at mod I level. Miscellaneous OT Goal #2: Pt will perform dynamic standing balance task >5 min at mod I level as precursor for ADL retraining.  Visit Information  Last OT Received On: 04/03/12 Assistance Needed: +1    Subjective Data      Prior Functioning       Cognition  Cognition Overall Cognitive Status: Appears within functional limits for tasks assessed/performed Arousal/Alertness: Awake/alert Orientation Level: Appears intact for tasks assessed Behavior During Session: [facility] for tasks performed    Mobility  Bed Mobility Bed Mobility: Supine to Sit;Sitting - Scoot to Edge of Bed Supine to Sit: 6: Modified independent (Device/Increase time) Sitting - Scoot to Edge of Bed: 6: Modified independent (Device/Increase time) Transfers Transfers: Sit to Stand;Stand to Sit Sit to Stand: 4: Min assist;From bed;With upper extremity assist;6: Modified independent (Device/Increase time);From toilet Stand to Sit: 6: Modified independent (Device/Increase time);With upper extremity assist;To toilet;5: Supervision;To chair/3-in-1    Exercises      Balance     End of Session OT - End of Session Activity Tolerance: Patient tolerated treatment well Patient left: in chair;with call bell/phone within reach;with family/visitor present  GO     Evern Bio 04/03/2012, 10:14 AM [phone]

## 2012-04-03 NOTE — Clinical Documentation Improvement (Signed)
GENERIC DOCUMENTATION CLARIFICATION QUERY  THIS DOCUMENT IS NOT A PERMANENT PART OF THE MEDICAL RECORD  TO RESPOND TO THE THIS QUERY, FOLLOW THE INSTRUCTIONS BELOW:  1. If needed, update documentation for the patient's encounter via the notes activity.  2. Access this query again and click edit on the In [employer].  3. After updating, or not, click F2 to complete all highlighted (required) fields concerning your review. Select "additional documentation in the medical record" OR "no additional documentation provided".  4. Click Sign note button.  5. The deficiency will fall out of your In Basket *Please let us know if you are not able to complete this workflow by phone or e-mail (listed below).  Please update your documentation within the medical record to reflect your response to this query.                                                                                        04/03/12   Dear Dr. Timothy Lasso   / Associates,  In a better effort to capture your patient's severity of illness/SOI, risk of mortality/ROM, reflect appropriate length of stay and utilization of resources, a review of the patient medical record has revealed the following indicators.   PLEASE CLARIFY IN NOTES AND DC SUMMARY "R SIDED WEAKNESS". THANK YOU.  Possible Clinical Conditions? - Right hemiparesis improved slightly - Other Condition (please specify)    Supporting Information: - Risk Factors: Acute Cerebellar CVA - Signs & Symptoms: per Note 2/10:"Ataxia, Dysarthria/slurred speech and R sided Weakness  - following cerebral infarction- Improved slightly" - Treatment: Possible CIR   You may use possible, probable, or suspect with inpatient documentation. possible, probable, suspected diagnoses MUST be documented at the time of discharge  Reviewed:  no additional documentation provided  Thank You,  Beverley Fiedler RN BSN Clinical Documentation Specialist: Tele:  [phone]  Health Information  Management [place]

## 2012-04-03 NOTE — Progress Notes (Signed)
Subjective: Admitted 03/31/12 with Cerebellar CVA, Ataxia and Nausea. Feels like there has been some improvement in coordination, but still poor. Speech improved since Fri.   Still has a little trouble putting straw to mouth.   Left leg hurting > one month. +BMs, (-) Ab pains.  No Nausea.  Objective: Vital signs in last 24 hours: Temp:  [97.3 F (36.3 C)-97.6 F (36.4 C)] 97.4 F (36.3 C) (02/10 0528) Pulse Rate:  [67-72] 67 (02/10 0528) Resp:  [20] 20 (02/10 0528) BP: (127-151)/(53-91) 136/68 mmHg (02/10 0528) SpO2:  [94 %-95 %] 95 % (02/10 0528) Weight change:  Last BM Date: 04/01/12  CBG (last 3)   Recent Labs  04/02/12 1137 04/02/12 1611 04/02/12 2124  GLUCAP 135* 115* 104*    Intake/Output from previous day:   Intake/Output this shift:    General appearance: alert and no distress Eyes: no scleral icterus Throat: oropharynx moist without erythema Resp: clear to auscultation bilaterally Cardio: regular rate and rhythm, S1, S2 normal, no murmur, click, rub or gallop GI: soft, non-tender; bowel sounds normal; no masses,  no organomegaly Extremities: no clubbing, cyanosis or edema.  L leg sensitive to touch.  Great ROM. Neurologic: minimal word finding difficulties; finger-nose improved though slightly ataxic.  Tongue midline. Rapid alternating movements better; strength intact.   Lab Results:  Recent Labs  04/01/12 0730 04/02/12 0612  NA 139 139  K 3.7 3.9  CL 101 103  CO2 27 26  GLUCOSE 120* 114*  BUN 20 23  CREATININE 1.10 1.15*  CALCIUM 9.1 9.2    Recent Labs  04/01/12 0730 04/02/12 0612  AST 13 12  ALT 14 13  ALKPHOS 40 40  BILITOT 0.4 0.3  PROT 6.6 6.7  ALBUMIN 3.7 3.6    Recent Labs  03/31/12 1619  WBC 8.1  HGB 15.5*  HCT 45.6  MCV 93.8  PLT 209   Lab Results  Component Value Date   INR 0.98 03/27/2009   INR 0.94 02/28/2009   No results found for this basename: CKTOTAL, CKMB, CKMBINDEX, TROPONINI,  in the last 72 hours  Recent  Labs  03/31/12 1619  TSH 1.379   No results found for this basename: VITAMINB12, FOLATE, FERRITIN, TIBC, IRON, RETICCTPCT,  in the last 72 hours  Studies/Results: Mr Maxine Glenn Head/brain Wo Cm  04/01/2012  *RADIOLOGY REPORT*  Clinical Data: Right cerebellar stroke  MRA HEAD WITHOUT CONTRAST  Technique: Angiographic images of the Circle of Willis were obtained using MRA technique without intravenous contrast.  Comparison: MRI brain 03/31/2012  Findings: Fetal origin of the posterior cerebral artery with hypoplastic posterior circulation.  Right vertebral artery is patent to the basilar.  The basilar is small and the has a high- grade stenosis in the mid to distal portion.  Some this may be due to congenital hypoplasia versus atherosclerotic disease.  Left vertebral artery ends in pica and does not contribute to the basilar.  The basilar terminates in the superior cerebellar arteries. Small right cerebellar artery is   patent proximally with the distal vessel not seen.  Left superior cerebellar artery is difficult to see and may be very small or diseased.  Fetal origin of the posterior cerebral artery bilaterally with hypoplastic distal basilar and P1 segments.  Internal carotid artery is patent bilaterally.  Anterior and middle cerebral arteries are patent bilaterally without stenosis. Negative for aneurysm.  IMPRESSION: Fetal origin of the posterior serve cerebral arteries with hypoplastic vertebral basilar system.  There is a very small distal  basilar artery which could be due to atherosclerotic disease or congenital hypoplasia.  With regard to the patient's right superior cerebellar infarct, the right superior cerebral artery is patent proximally.   Original Report Authenticated By: Janeece Riggers, M.D.    Echocardiogram (03/31/12)- The cavity size was normal. Wall thickness was normal. Systolic function was normal. The estimated ejection fraction was in the range of 55% to 60%. Wall motion was normal; there  were no regional wall motion abnormalities. Doppler parameters are consistent with abnormal left ventricular relaxation (grade 1 diastolic dysfunction).   Carotid Duplex (Doppler) (03/31/12)- There is no obvious evidence of hemodynamically significant internal carotid artery stenosis >40%. Vertebral arteries are patent with antegrade flow.     Medications: Scheduled: . aspirin  325 mg Oral Daily  . atorvastatin  40 mg Oral q1800  . benazepril  20 mg Oral Daily  . cholecalciferol  5,000 Units Oral Daily  . donepezil  5 mg Oral QHS  . enoxaparin (LOVENOX) injection  40 mg Subcutaneous Q24H  . folic acid  1 mg Oral BID  . insulin aspart  0-9 Units Subcutaneous TID WC  . levothyroxine  100 mcg Oral QAC breakfast  . predniSONE  4 mg Oral Q breakfast  . sertraline  100 mg Oral Daily  . vitamin B-12  1,000 mcg Oral Daily   Continuous:    Assessment/Plan: Principal Problem:  1. Cerebellar stroke, acute- MRA shows no critical stenosis or need for additional intervention.  Echo: Systolic function was normal c EF 55% to 60% and grade 1 diastolic dysfunction, No Carotid Stenosis, MRA without issues, No Afib. Will continue risk factor modification with DM2, BP, lipid control. She was not on an aspirin so will continue ASA (H/O Antral Ulcer 02/2009 - Low threshold to do Plavix 75 instead) for secondary stroke prevention.  Continue PT/OT- [facility] consult for intensive rehab.  Tele reviewed - NSR HR 70. 2. Ataxia, Dysarthria/slurred speech and R sided Weakness  - following cerebral infarction- Improved slightly.  Continue PT/OT/ST, fall precautions.  Active Problems:  3. Hypertension- adequate control on home meds. Consider additional treatment if BP remains above 150/90.  4. Hyperlipidemia- LDL at goal less than 100 (ideal less than 70) on Crestor at home - Increase to 20 mg on D/c. TC 171/TG230/HDL40/LDL85. 5. Obstructive sleep apnea- has declined CPAP.  6. Diabetes mellitus- well  controlled. Metformin on hold and can be restarted today.  A1C is 6.5%  7. Nausea and vomiting- resolved- secondary to ataxia.  KUB on the 7th: Nonobstructive bowel gas pattern although a suggestion of wall thickening and left lower quadrant bowel may suggest inflammatory or infectious colitis. Will monitor but no Sxs to suggest pathology. 8. Polymyalgia Rheumatica- continue steroids, She is down to 4 mg PO daily and was supposed to wean by 1 mg per month per Dr Corliss Skains..  9. Hypothyroid - On Meds.  TSH is fine. 10. Depression - Setraline. 11. Disposition- CIR consult . Inpt W/up is complete so if CIR appropriate and they can take her - she can go today.  If not then look into SNF.   LOS: 3 days   Saraann Enneking M 04/03/2012, 7:16 AM

## 2012-04-03 NOTE — Progress Notes (Signed)
Rehab Admissions Coordinator Note:  Patient was screened by Brock Ra for appropriateness for an Inpatient Acute Rehab Consult.  At this time, we are recommending Inpatient Rehab consult.  Brock Ra 04/03/2012, 9:33 AM  I can be reached at [phone].

## 2012-04-03 NOTE — Progress Notes (Signed)
Speech Language Pathology Treatment Patient Details Name: [name] MRN: [id number] DOB: Jun 02, 1934 Today's Date: 04/03/2012 Time: 1478-2956 SLP Time Calculation (min): 23 min  Assessment / Plan / Recommendation Clinical Impression  Treatment focused on use of strategies to improve speech intelligibility and to compensate for word finding deficits. Pt verbalized need to slow rate of speech and demonstrates its use with mod independence and nearly 90% intelligibilty at short conversation level.  She referenced the sign posted in her room to remind her and requested it stay posted.  SLP also reviewed and provided in writing word finding deficit compensatory strategies.  Pt demonstrated use of these strategies within 5 minutes by referencing written form within 5 minutes independently!    Rec continue SLP for functional speech/language/cognitive skills to allow maximal independence for eventual dc home- after CIR stay (if CIR approved by insurance).      SLP Plan  Continue with current plan of care    Pertinent Vitals/Pain Afebrile  SLP Goals  SLP Goals SLP Goal #1: Pt will demonstrate strategies to improve speech intelligibility to 80% at simple conversation level with mod independence.  SLP Goal #1 - Progress: Met SLP Goal #2: Pt will demonstrate ability to manage home tasks (eg: medicine, bill, appt tasks) with mod independence.  SLP Goal #2 - Progress: Not met SLP Goal #3: Pt will demonstrate word finding strategy usages with mod assist and 80% accuracy during communication breakdowns.   SLP Goal #3 - Progress: Progressing toward goal  General Temperature Spikes Noted: No Respiratory Status: Room air Behavior/Cognition: Alert;Cooperative    Treatment Treatment focused on: Dysarthria;Other (comment) (word finding strategies)   GO     Chales Abrahams 04/03/2012, 6:55 PM

## 2012-04-04 ENCOUNTER — Inpatient Hospital Stay (HOSPITAL_COMMUNITY)
Admission: RE | Admit: 2012-04-04 | Discharge: 2012-04-10 | DRG: 945 | Disposition: A | Discharge status: Home / Self Care | Payer: Medicare Other | Source: Intra-hospital | Attending: Physical Medicine & Rehabilitation | Admitting: Physical Medicine & Rehabilitation

## 2012-04-04 DIAGNOSIS — K219 Gastro-esophageal reflux disease without esophagitis: Secondary | ICD-10-CM

## 2012-04-04 DIAGNOSIS — Z7982 Long term (current) use of aspirin: Secondary | ICD-10-CM

## 2012-04-04 DIAGNOSIS — I639 Cerebral infarction, unspecified: Secondary | ICD-10-CM

## 2012-04-04 DIAGNOSIS — E039 Hypothyroidism, unspecified: Secondary | ICD-10-CM

## 2012-04-04 DIAGNOSIS — Z87891 Personal history of nicotine dependence: Secondary | ICD-10-CM | POA: Diagnosis not present

## 2012-04-04 DIAGNOSIS — E1142 Type 2 diabetes mellitus with diabetic polyneuropathy: Secondary | ICD-10-CM | POA: Diagnosis not present

## 2012-04-04 DIAGNOSIS — E785 Hyperlipidemia, unspecified: Secondary | ICD-10-CM | POA: Diagnosis not present

## 2012-04-04 DIAGNOSIS — IMO0002 Reserved for concepts with insufficient information to code with codable children: Secondary | ICD-10-CM

## 2012-04-04 DIAGNOSIS — M353 Polymyalgia rheumatica: Secondary | ICD-10-CM | POA: Diagnosis not present

## 2012-04-04 DIAGNOSIS — Z79899 Other long term (current) drug therapy: Secondary | ICD-10-CM

## 2012-04-04 DIAGNOSIS — Z8673 Personal history of transient ischemic attack (TIA), and cerebral infarction without residual deficits: Secondary | ICD-10-CM | POA: Diagnosis not present

## 2012-04-04 DIAGNOSIS — Z5189 Encounter for other specified aftercare: Secondary | ICD-10-CM | POA: Diagnosis not present

## 2012-04-04 DIAGNOSIS — N289 Disorder of kidney and ureter, unspecified: Secondary | ICD-10-CM | POA: Diagnosis not present

## 2012-04-04 DIAGNOSIS — I10 Essential (primary) hypertension: Secondary | ICD-10-CM

## 2012-04-04 DIAGNOSIS — F329 Major depressive disorder, single episode, unspecified: Secondary | ICD-10-CM | POA: Diagnosis not present

## 2012-04-04 DIAGNOSIS — Z96659 Presence of unspecified artificial knee joint: Secondary | ICD-10-CM | POA: Diagnosis not present

## 2012-04-04 DIAGNOSIS — I6330 Cerebral infarction due to thrombosis of unspecified cerebral artery: Secondary | ICD-10-CM | POA: Diagnosis not present

## 2012-04-04 DIAGNOSIS — E1149 Type 2 diabetes mellitus with other diabetic neurological complication: Secondary | ICD-10-CM

## 2012-04-04 DIAGNOSIS — G4733 Obstructive sleep apnea (adult) (pediatric): Secondary | ICD-10-CM | POA: Diagnosis not present

## 2012-04-04 DIAGNOSIS — F3289 Other specified depressive episodes: Secondary | ICD-10-CM

## 2012-04-04 LAB — CBC
MCH: 30.9 pg (ref 26.0–34.0)
MCHC: 32.9 g/dL (ref 30.0–36.0)
MCV: 93.9 fL (ref 78.0–100.0)
Platelets: 187 10*3/uL (ref 150–400)
RDW: 13.3 % (ref 11.5–15.5)

## 2012-04-04 LAB — GLUCOSE, CAPILLARY: Glucose-Capillary: 110 mg/dL — ABNORMAL HIGH (ref 70–99)

## 2012-04-04 LAB — CREATININE, SERUM: Creatinine, Ser: 1.63 mg/dL — ABNORMAL HIGH (ref 0.50–1.10)

## 2012-04-04 MED ORDER — PREDNISONE 1 MG PO TABS
4.0000 mg | ORAL_TABLET | Freq: Every day | ORAL | Status: DC
  Administered 2012-04-05 – 2012-04-10 (×6): 4 mg via ORAL
  Filled 2012-04-04 (×7): qty 4

## 2012-04-04 MED ORDER — ROSUVASTATIN CALCIUM 10 MG PO TABS: 20.0000 mg | ORAL_TABLET | Freq: Every day | ORAL | Status: DC

## 2012-04-04 MED ORDER — SENNOSIDES-DOCUSATE SODIUM 8.6-50 MG PO TABS: 1.0000 | ORAL_TABLET | Freq: Every evening | ORAL | Status: DC | PRN

## 2012-04-04 MED ORDER — ACETAMINOPHEN 325 MG PO TABS: 325.0000 mg | ORAL_TABLET | ORAL | Status: DC | PRN

## 2012-04-04 MED ORDER — ASPIRIN 325 MG PO TABS: 325.0000 mg | ORAL_TABLET | Freq: Every day | ORAL | Status: DC

## 2012-04-04 MED ORDER — ALPRAZOLAM 0.5 MG PO TABS
0.5000 mg | ORAL_TABLET | Freq: Three times a day (TID) | ORAL | Status: DC | PRN
  Administered 2012-04-04: 0.5 mg via ORAL
  Filled 2012-04-04: qty 1

## 2012-04-04 MED ORDER — ENOXAPARIN SODIUM 40 MG/0.4ML ~~LOC~~ SOLN: 40.0000 mg | SUBCUTANEOUS | Status: DC

## 2012-04-04 MED ORDER — LEVOTHYROXINE SODIUM 100 MCG PO TABS
100.0000 ug | ORAL_TABLET | Freq: Every day | ORAL | Status: DC
  Administered 2012-04-05 – 2012-04-10 (×6): 100 ug via ORAL
  Filled 2012-04-04 (×7): qty 1

## 2012-04-04 MED ORDER — ACETAMINOPHEN 325 MG PO TABS: 650.0000 mg | ORAL_TABLET | ORAL | Status: DC | PRN

## 2012-04-04 MED ORDER — BENAZEPRIL HCL 20 MG PO TABS
20.0000 mg | ORAL_TABLET | Freq: Every day | ORAL | Status: DC
  Administered 2012-04-05 – 2012-04-06 (×2): 20 mg via ORAL
  Filled 2012-04-04 (×3): qty 1

## 2012-04-04 MED ORDER — SORBITOL 70 % SOLN: 30.0000 mL | Freq: Every day | Status: DC | PRN

## 2012-04-04 MED ORDER — VITAMIN D3 25 MCG (1000 UNIT) PO TABS
5000.0000 [IU] | ORAL_TABLET | Freq: Every day | ORAL | Status: DC
  Administered 2012-04-05 – 2012-04-10 (×6): 5000 [IU] via ORAL
  Filled 2012-04-04 (×7): qty 5

## 2012-04-04 MED ORDER — DONEPEZIL HCL 5 MG PO TABS
5.0000 mg | ORAL_TABLET | Freq: Every day | ORAL | Status: DC
  Administered 2012-04-04 – 2012-04-09 (×6): 5 mg via ORAL
  Filled 2012-04-04 (×8): qty 1

## 2012-04-04 MED ORDER — FOLIC ACID 1 MG PO TABS
1.0000 mg | ORAL_TABLET | Freq: Two times a day (BID) | ORAL | Status: DC
Start: 2012-04-04 — End: 2012-04-10
  Administered 2012-04-04 – 2012-04-10 (×12): 1 mg via ORAL
  Filled 2012-04-04 (×16): qty 1

## 2012-04-04 MED ORDER — ONDANSETRON HCL 4 MG PO TABS: 4.0000 mg | ORAL_TABLET | Freq: Four times a day (QID) | ORAL | Status: DC | PRN

## 2012-04-04 MED ORDER — ATORVASTATIN CALCIUM 40 MG PO TABS
40.0000 mg | ORAL_TABLET | Freq: Every day | ORAL | Status: DC
  Administered 2012-04-05 – 2012-04-09 (×5): 40 mg via ORAL
  Filled 2012-04-04 (×7): qty 1

## 2012-04-04 MED ORDER — VITAMIN B-12 1000 MCG PO TABS
1000.0000 ug | ORAL_TABLET | Freq: Every day | ORAL | Status: DC
  Administered 2012-04-05 – 2012-04-10 (×6): 1000 ug via ORAL
  Filled 2012-04-04 (×8): qty 1

## 2012-04-04 MED ORDER — INSULIN ASPART 100 UNIT/ML ~~LOC~~ SOLN
0.0000 [IU] | Freq: Three times a day (TID) | SUBCUTANEOUS | Status: DC
  Administered 2012-04-07 – 2012-04-09 (×2): 1 [IU] via SUBCUTANEOUS

## 2012-04-04 MED ORDER — PREDNISONE 1 MG PO TABS: 4.0000 mg | ORAL_TABLET | Freq: Every day | ORAL | Status: DC

## 2012-04-04 MED ORDER — ONDANSETRON HCL 4 MG/2ML IJ SOLN: 4.0000 mg | Freq: Four times a day (QID) | INTRAMUSCULAR | Status: DC | PRN

## 2012-04-04 MED ORDER — SERTRALINE HCL 100 MG PO TABS
100.0000 mg | ORAL_TABLET | Freq: Every day | ORAL | Status: DC
  Administered 2012-04-05 – 2012-04-10 (×6): 100 mg via ORAL
  Filled 2012-04-04 (×8): qty 1

## 2012-04-04 MED ORDER — ASPIRIN 325 MG PO TABS
325.0000 mg | ORAL_TABLET | Freq: Every day | ORAL | Status: DC
  Administered 2012-04-05 – 2012-04-10 (×6): 325 mg via ORAL
  Filled 2012-04-04 (×8): qty 1

## 2012-04-04 MED ORDER — INSULIN ASPART 100 UNIT/ML ~~LOC~~ SOLN
0.0000 [IU] | Freq: Three times a day (TID) | SUBCUTANEOUS | Status: DC
  Administered 2012-04-04 (×2): 1 [IU] via SUBCUTANEOUS

## 2012-04-04 MED ORDER — METFORMIN HCL 500 MG PO TABS
500.0000 mg | ORAL_TABLET | Freq: Every day | ORAL | Status: DC
  Administered 2012-04-05 – 2012-04-06 (×2): 500 mg via ORAL
  Filled 2012-04-04 (×3): qty 1

## 2012-04-04 MED ORDER — ENOXAPARIN SODIUM 30 MG/0.3ML ~~LOC~~ SOLN
40.0000 mg | SUBCUTANEOUS | Status: DC
  Administered 2012-04-04 – 2012-04-09 (×6): 40 mg via SUBCUTANEOUS
  Filled 2012-04-04 (×7): qty 0.4

## 2012-04-04 NOTE — Progress Notes (Signed)
Subjective: Admitted 03/31/12 with Cerebellar CVA, Ataxia and Nausea. Slowly progressing and ready for CIR/Rehab. Unfortunately the CIR order placed in the chart over the weekend was not familiar to myself or Dr Clelia Croft.  We both interpreted as a formal CIR consult and not a screen consult.  It took until yesterday afternoon to realize this and finally place the correct order. +BMs, (-) Ab pains.  No Nausea.  Objective: Vital signs in last 24 hours: Temp:  [97.1 F (36.2 C)-98.1 F (36.7 C)] 97.6 F (36.4 C) (02/11 0547) Pulse Rate:  [65-82] 76 (02/11 0547) Resp:  [20] 20 (02/11 0547) BP: (97-150)/(48-93) 118/71 mmHg (02/11 0547) SpO2:  [92 %-97 %] 94 % (02/11 0547) Weight change:  Last BM Date: 04/03/12  CBG (last 3)   Recent Labs  04/03/12 1645 04/03/12 2108 04/04/12 0719  GLUCAP 116* 136* 116*    Intake/Output from previous day: 02/10 0701 - 02/11 0700 In: 360 [P.O.:360] Out: -  Intake/Output this shift:    General appearance: alert and no distress Eyes: no scleral icterus Throat: oropharynx moist without erythema Resp: clear to auscultation bilaterally Cardio: regular rate and rhythm, S1, S2 normal, no murmur, click, rub or gallop GI: soft, non-tender; bowel sounds normal; no masses,  no organomegaly Extremities: no clubbing, cyanosis or edema.  L leg sensitive to touch.  Great ROM. Neurologic: minimal word finding difficulties; finger-nose improved though slightly ataxic.  Tongue midline. Rapid alternating movements better; strength intact.   Lab Results:  Recent Labs  04/01/12 0730 04/02/12 0612  NA 139 139  K 3.7 3.9  CL 101 103  CO2 27 26  GLUCOSE 120* 114*  BUN 20 23  CREATININE 1.10 1.15*  CALCIUM 9.1 9.2    Recent Labs  04/01/12 0730 04/02/12 0612  AST 13 12  ALT 14 13  ALKPHOS 40 40  BILITOT 0.4 0.3  PROT 6.6 6.7  ALBUMIN 3.7 3.6   No results found for this basename: WBC, NEUTROABS, HGB, HCT, MCV, PLT,  in the last 72 hours Lab Results   Component Value Date   INR 0.98 03/27/2009   INR 0.94 02/28/2009   No results found for this basename: CKTOTAL, CKMB, CKMBINDEX, TROPONINI,  in the last 72 hours No results found for this basename: TSH, T4TOTAL, FREET3, T3FREE, THYROIDAB,  in the last 72 hours No results found for this basename: VITAMINB12, FOLATE, FERRITIN, TIBC, IRON, RETICCTPCT,  in the last 72 hours  Studies/Results: No results found. Echocardiogram (03/31/12)- The cavity size was normal. Wall thickness was normal. Systolic function was normal. The estimated ejection fraction was in the range of 55% to 60%. Wall motion was normal; there were no regional wall motion abnormalities. Doppler parameters are consistent with abnormal left ventricular relaxation (grade 1 diastolic dysfunction).   Carotid Duplex (Doppler) (03/31/12)- There is no obvious evidence of hemodynamically significant internal carotid artery stenosis >40%. Vertebral arteries are patent with antegrade flow.     Medications: Scheduled: . aspirin  325 mg Oral Daily  . atorvastatin  40 mg Oral q1800  . benazepril  20 mg Oral Daily  . cholecalciferol  5,000 Units Oral Daily  . donepezil  5 mg Oral QHS  . enoxaparin (LOVENOX) injection  40 mg Subcutaneous Q24H  . folic acid  1 mg Oral BID  . insulin aspart  0-9 Units Subcutaneous TID WC  . levothyroxine  100 mcg Oral QAC breakfast  . metFORMIN  500 mg Oral Q breakfast  . predniSONE  4 mg Oral  Q breakfast  . sertraline  100 mg Oral Daily  . vitamin B-12  1,000 mcg Oral Daily   Continuous:    Assessment/Plan: Principal Problem:  1. Cerebellar stroke, acute- MRA shows no critical stenosis or need for additional intervention.  Echo: Systolic function was normal c EF 55% to 60% and grade 1 diastolic dysfunction, No Carotid Stenosis, MRA without issues, No Afib. Will continue risk factor modification with DM2, BP, lipid control. Continue ASA (H/O Antral Ulcer 02/2009 - Low threshold to do Plavix 75  instead) for secondary stroke prevention.  Continue PT/OT- [facility] consult for intensive rehab.  If not approved or issues please get FL-2 out for SNF.  CIR is preferred for her.   2. Ataxia, Dysarthria/slurred speech and R sided Weakness  - Improved slightly.  Continue PT/OT/ST, fall precautions.  Active Problems:  3. Hypertension- adequate control on home meds. Consider additional treatment if BP remains above 150/90.  4. Hyperlipidemia- LDL at goal less than 100 (ideal less than 70) on Crestor at home - Increase to 20 mg on D/c. TC 171/TG230/HDL40/LDL85. 5. Obstructive sleep apnea- has declined CPAP.  6. Diabetes mellitus- well controlled. Metformin restarted. A1C 5.8 - 6.5%  7. Nausea and vomiting- resolved- secondary to ataxia.  KUB on the 7th: Nonobstructive bowel gas pattern although a suggestion of wall thickening and left lower quadrant bowel may suggest inflammatory or infectious colitis. Will monitor but no Sxs to suggest pathology. 8. Polymyalgia Rheumatica- continue steroids, She is down to 4 mg PO daily and was supposed to wean by 1 mg per month per Dr Corliss Skains..  9. Hypothyroid - On Meds.  TSH is fine. 10. Depression - Setraline. 11. Disposition- CIR consult . Inpt W/up is complete so if CIR appropriate and they can take her - she can go today.  If not then look into SNF.   LOS: 4 days   Sandip Power M 04/04/2012, 7:28 AM

## 2012-04-04 NOTE — H&P (View-Only) (Signed)
Physical Medicine and Rehabilitation Admission H&P    No chief complaint on file. : HPI: [name] is a 78 y.o. right-handed female with history of polymyalgia rheumatica, hypertension as well as diabetes mellitus with peripheral neuropathy. Admitted 03/31/2012 with right-sided weakness and dizziness with associated nausea and vomiting. Patient reports that she had not taken her medications in the past 2 days because she just didn't want to take them. MRI of the brain showed a 3 cm acute infarct affecting the right cerebellum without hemorrhage. MRA of the head with atherosclerotic type disease. Echocardiogram with ejection fraction of 60% and grade 1 diastolic dysfunction. Carotid Dopplers with no ICA stenosis. Patient placed on aspirin therapy as well as subcutaneous Lovenox for DVT prophylaxis. Hemoglobin A1c is 6.5 and remains on Glucophage. Physical and occupational therapy evaluations completed an ongoing with reported issues of significant balance impairments and mild cognitive impairments with recommendations of physical medicine rehabilitation consult to consider inpatient rehabilitation services. Patient was felt to be a candidate for inpatient rehabilitation services and was admitted for a comprehensive rehabilitation program after evaluation today.  Review of Systems  Respiratory: Positive for shortness of breath.  Gastrointestinal: Positive for nausea and vomiting.  Reflux  Musculoskeletal: Positive for myalgias and joint pain.  Neurological: Positive for dizziness and weakness. Mild depression/memory loss All other systems reviewed and are negative   Past Medical History  Diagnosis Date  . Hyperlipidemia   . Hypertension   . Obstructive sleep apnea   . Bifascicular block   . Diabetes mellitus   . Thyroid disease   . Hypothyroidism   . Gastric ulcer   . Squamous cell skin cancer   . Shortness of breath   . Stroke 03/31/2012  . GERD (gastroesophageal reflux disease)   .  Kidney stones     HX OF  . Arthritis    Past Surgical History  Procedure Laterality Date  . Total knee arthroplasty      Left knee  . Tonsillectomy    . Appendectomy    . Hernia repair    . Rotator cuff repair      RT SHOULDER  . Wrist fracture surgery     Family History  Problem Relation Age of Onset  . Heart failure Mother    Social History:  reports that she quit smoking about 7 years ago. She has never used smokeless tobacco. She reports that  drinks alcohol. She reports that she does not use illicit drugs. Allergies: No Known Allergies Medications Prior to Admission  Medication Sig Dispense Refill  . benazepril (LOTENSIN) 20 MG tablet Take 20 mg by mouth daily.      . Cholecalciferol 5000 UNITS capsule Take 5,000 Units by mouth daily.      . donepezil (ARICEPT) 5 MG tablet Take 5 mg by mouth at bedtime.      . folic acid (FOLVITE) 1 MG tablet Take 1 mg by mouth 2 (two) times daily.      . levothyroxine (SYNTHROID, LEVOTHROID) 100 MCG tablet Take 100 mcg by mouth daily.      . metFORMIN (GLUCOPHAGE) 500 MG tablet Take 500 mg by mouth daily with breakfast.      . Mirabegron (MYRBETRIQ PO) Take 25-50 mg by mouth daily. Try 25 mg sample dose once per day. If this does not help, try 50 mg sample dose once per day.      . rosuvastatin (CRESTOR) 10 MG tablet Take 15 mg by mouth daily.      .   sertraline (ZOLOFT) 100 MG tablet Take 100 mg by mouth daily.      . vitamin B-12 (CYANOCOBALAMIN) 1000 MCG tablet Take 1,000 mcg by mouth daily.        Home: Home Living Lives With: Son Available Help at Discharge: Available 24 hours/day Type of Home: House Home Access: Stairs to enter Entrance Stairs-Number of Steps: 5 Entrance Stairs-Rails: Right;Left;Can reach both Home Layout: One level Bathroom Shower/Tub: Walk-in shower Bathroom Toilet: Standard Home Adaptive Equipment: Straight cane;Walker - rolling;Wheelchair - manual Additional Comments: Son is currently home 24/7 as he is  out of work.  However, he will need to go to interviews and accept a job as it becomes available and will therefore need to leave pt alone.   Functional History: Prior Function Able to Take Stairs?: Yes Driving: Yes Vocation: Retired  Functional Status:  Mobility: Bed Mobility Bed Mobility: Not assessed Supine to Sit: 6: Modified independent (Device/Increase time) Sitting - Scoot to Edge of Bed: 6: Modified independent (Device/Increase time) Transfers Transfers: Sit to Stand;Stand to Sit Sit to Stand: 4: Min assist;With upper extremity assist;From chair/3-in-1 Stand to Sit: 5: Supervision;With upper extremity assist;To chair/3-in-1 Ambulation/Gait Ambulation/Gait Assistance: 4: Min assist Ambulation Distance (Feet): 200 Feet Assistive device: Rolling walker;1 person hand held assist;None Ambulation/Gait Assistance Details: pt with antalgic gait when amb without device. trialed RW however pt with unsafe walker management due to inability to focus and decreased desire to use RW. With onset of fatigue pt became unsteady when amb without AD and had R knee instabilty/mild buckling requiring use of R hand held assist. Pt did reports she holds onto the wall alot at home. Gait Pattern: Step-through pattern;Decreased stride length;Antalgic;Wide base of support Gait velocity: slow General Gait Details: pt most stable with hand held assist due to pt's unsafe use of RW Stairs: Yes Stairs Assistance: 4: Min assist Stairs Assistance Details (indicate cue type and reason): pt with strong use of railing and hand held assist, v/c's for sequencing and to complete non-reciprocal pattern Stair Management Technique: One rail Left;Forwards (1 person HHA) Number of Stairs: 3    ADL: ADL Grooming: Brushing hair;Teeth care;Wash/dry hands;Supervision/safety Where Assessed - Grooming: Unsupported standing Upper Body Bathing: Simulated;Set up Where Assessed - Upper Body Bathing: Unsupported sitting Lower  Body Bathing: Simulated;Min guard Where Assessed - Lower Body Bathing: Unsupported sitting Upper Body Dressing: Performed;Set up Where Assessed - Upper Body Dressing: Unsupported sitting Lower Body Dressing: Simulated;Min guard Where Assessed - Lower Body Dressing: Unsupported sitting Toilet Transfer: Supervision/safety Toilet Transfer Method: Sit to stand Toilet Transfer Equipment: Comfort height toilet Equipment Used: Gait belt Transfers/Ambulation Related to ADLs: min assist with HHA x1.  Increased time and wide base of support.  ADL Comments: Denies dizziness, feels the pain in her R leg is making her off-balance.  Cognition: Cognition Overall Cognitive Status: Other (comment) (pt reports recently being told she has dementia, on aricept ) Arousal/Alertness: Awake/alert Orientation Level: Oriented X4 Attention: Selective Selective Attention: Appears intact Memory: Impaired Memory Impairment: Storage deficit;Retrieval deficit;Decreased recall of new information (1/4 Ind 1/4 Cat cue 2/4 m/c cue score 7/12) Awareness: Appears intact Problem Solving: Appears intact Safety/Judgment: Appears intact Cognition Overall Cognitive Status: Appears within functional limits for tasks assessed/performed Arousal/Alertness: Awake/alert Orientation Level: Appears intact for tasks assessed Behavior During Session: WFL for tasks performed Cognition - Other Comments: pt easily distracted and with delayed processing/increased response time   Blood pressure 146/72, pulse 75, temperature 97.9 F (36.6 C), temperature source Oral, resp. rate 20, height   5' 2.99" (1.6 m), weight 95.255 kg (210 lb), SpO2 95.00%. Physical Exam  Vitals reviewed.  Constitutional: She is oriented to person, place, and time.  HENT:  Head: Normocephalic.  Eyes:  Pupils round and reactive to light Neck: Neck supple. No thyromegaly present.  Cardiovascular: Normal rate and regular rhythm. No murmurs Pulmonary/Chest:  Breath sounds normal. No respiratory distress. No wheezes, Abdominal: Soft. Bowel sounds are normal. She exhibits no distension. There is no tenderness.  Neurological: She is alert and oriented to person, place, and time. No cranial nerve deficit.  Patient follows simple commands. Noted decreased fine motor skills and coordination on the right side with mild ataxia, past pointing, decreased FMC of arm and leg. Truncal ataxia also with lean to the right especially when distracted.  Skin: Skin is warm and dry.  Psychiatric: She has a normal mood and affect. Her behavior is normal. Thought content normal.  Perhaps mild attention and awareness issues    Results for orders placed during the hospital encounter of 03/31/12 (from the past 48 hour(s))  GLUCOSE, CAPILLARY     Status: Abnormal   Collection Time    04/02/12 11:37 AM      Result Value Range   Glucose-Capillary 135 (*) 70 - 99 mg/dL   Comment 1 Documented in Chart    GLUCOSE, CAPILLARY     Status: Abnormal   Collection Time    04/02/12  4:11 PM      Result Value Range   Glucose-Capillary 115 (*) 70 - 99 mg/dL   Comment 1 Documented in Chart     Comment 2 Notify RN    GLUCOSE, CAPILLARY     Status: Abnormal   Collection Time    04/02/12  9:24 PM      Result Value Range   Glucose-Capillary 104 (*) 70 - 99 mg/dL  GLUCOSE, CAPILLARY     Status: Abnormal   Collection Time    04/03/12  7:11 AM      Result Value Range   Glucose-Capillary 116 (*) 70 - 99 mg/dL   Comment 1 Documented in Chart     Comment 2 Notify RN    GLUCOSE, CAPILLARY     Status: Abnormal   Collection Time    04/03/12 11:05 AM      Result Value Range   Glucose-Capillary 104 (*) 70 - 99 mg/dL   Comment 1 Notify RN    GLUCOSE, CAPILLARY     Status: Abnormal   Collection Time    04/03/12  4:45 PM      Result Value Range   Glucose-Capillary 116 (*) 70 - 99 mg/dL  GLUCOSE, CAPILLARY     Status: Abnormal   Collection Time    04/03/12  9:08 PM      Result Value  Range   Glucose-Capillary 136 (*) 70 - 99 mg/dL   Comment 1 Documented in Chart     Comment 2 Notify RN    GLUCOSE, CAPILLARY     Status: Abnormal   Collection Time    04/04/12  7:19 AM      Result Value Range   Glucose-Capillary 116 (*) 70 - 99 mg/dL   Comment 1 Documented in Chart     Comment 2 Notify RN    GLUCOSE, CAPILLARY     Status: Abnormal   Collection Time    04/04/12 10:54 AM      Result Value Range   Glucose-Capillary 125 (*) 70 - 99 mg/dL   No   results found.  Post Admission Physician Evaluation: 1. Functional deficits secondary  to thrombotic, right cerebellar infarct. 2. Patient is admitted to receive collaborative, interdisciplinary care between the physiatrist, rehab nursing staff, and therapy team. 3. Patient's level of medical complexity and substantial therapy needs in context of that medical necessity cannot be provided at a lesser intensity of care such as a SNF. 4. Patient has experienced substantial functional loss from his/her baseline which was documented above under the "Functional History" and "Functional Status" headings.  Judging by the patient's diagnosis, physical exam, and functional history, the patient has potential for functional progress which will result in measurable gains while on inpatient rehab.  These gains will be of substantial and practical use upon discharge  in facilitating mobility and self-care at the household level. 5. Physiatrist will provide 24 hour management of medical needs as well as oversight of the therapy plan/treatment and provide guidance as appropriate regarding the interaction of the two. 6. 24 hour rehab nursing will assist with bladder management, bowel management, safety, skin/wound care, disease management, medication administration, pain management and patient education  and help integrate therapy concepts, techniques,education, etc. 7. PT will assess and treat for:  Lower extremity strength, range of motion, stamina,  balance, functional mobility, safety, adaptive techniques and equipment, NMR, vestibular deficits.  Goals are: mod I. 8. OT will assess and treat for: ADL's, functional mobility, safety, upper extremity strength, adaptive techniques and equipment, NMR, vestibular deficits, education.   Goals are: mod I to occasional supervision. 9. SLP will assess and treat for: n/a.  Goals are: n/a. 10. Case Management and Social Worker will assess and treat for psychological issues and discharge planning. 11. Team conference will be held weekly to assess progress toward goals and to determine barriers to discharge. 12. Patient will receive at least 3 hours of therapy per day at least 5 days per week. 13. ELOS: 7-10 days      Prognosis:  excellent   Medical Problem List and Plan: 1. Right thrombotic cerebellar infarct 2.. DVT Prophylaxis/Anticoagulation: Subcutaneous Lovenox. Monitor platelet counts and any signs of bleeding 3. Mood: Zoloft 100 mg daily, Aricept 5 mg each bedtime. Provide emotional support and positive reinforcement 4. Neuropsych: This patient is capable of making decisions on his/her own behalf. 5. Diabetes mellitus with peripheral neuropathy. Hemoglobin A1c 6.5. Glucophage 500 mg daily. Check CBGs a.c. and at bedtime 6. Hypertension. Lotensin 20 mg daily. Monitor with increased activity 7. Polymyalgia rheumatica. Prednisone 4 mg daily. Monitor for any signs of flare up 8. Hypothyroidism. Synthroid daily 9. Hyperlipidemia. Lipitor  Nhyla Nappi T. Orest Dygert, MD, FAAPMR   04/04/2012 

## 2012-04-04 NOTE — Progress Notes (Signed)
Physical Therapy Treatment Patient Details Name: [name] MRN: [id number] DOB: December 07, 1934 Today's Date: 04/04/2012 Time: 6213-0865 PT Time Calculation (min): 24 min  PT Assessment / Plan / Recommendation Comments on Treatment Session  Pt mobilizing well however con't to have balance impairments, decreased activity tolerance, and mild cognitive deficits. Pt to con't to benefit from CIR to achieve modified independent function for safe transition home.    Follow Up Recommendations  CIR     Does the patient have the potential to tolerate intense rehabilitation     Barriers to Discharge        Equipment Recommendations  Rolling walker with 5" wheels    Recommendations for Other Services    Frequency Min 4X/week   Plan Discharge plan remains appropriate;Frequency remains appropriate    Precautions / Restrictions Precautions Precautions: Fall Restrictions Weight Bearing Restrictions: No   Pertinent Vitals/Pain 5/10 R LE pain    Mobility  Bed Mobility Bed Mobility: Left Sidelying to Sit Left Sidelying to Sit: 6: Modified independent (Device/Increase time);HOB elevated;With rails Details for Bed Mobility Assistance: strong use of bedrail Transfers Transfers: Sit to Stand;Stand to Sit Sit to Stand: 4: Min guard;With upper extremity assist;From chair/3-in-1;From bed Stand to Sit: 5: Supervision;With upper extremity assist;To chair/3-in-1 Details for Transfer Assistance: con't v/c's for safe hand placement, pt with poor recall despite multiple v/c's Ambulation/Gait Ambulation/Gait Assistance: 4: Min assist Ambulation Distance (Feet): 200 Feet Assistive device: Rolling walker;1 person hand held assist;None Ambulation/Gait Assistance Details: pt constantly vearing to L when using RW however more stable. when amb without AD pt braces self via R UE extension and demo's significant R LE antalgic limp. Pt with increased SOB when amb without AD. Pt prefers HHA or RW. Gait Pattern:  Step-through pattern;Decreased stride length;Antalgic;Wide base of support Gait velocity: slow Modified Rankin (Stroke Patients Only) Pre-Morbid Rankin Score: No significant disability Modified Rankin: Moderately severe disability    Exercises General Exercises - Lower Extremity Long Arc Quad: AROM;Both;10 reps;Seated Hip Flexion/Marching: AROM;Both;10 reps;Standing (in walker, pt with decreased ability to maintain R LE single limb stance compared to Left Mini-Sqauts: 10 reps;PROM;Standing   PT Diagnosis:    PT Problem List:   PT Treatment Interventions:     PT Goals Acute Rehab PT Goals Time For Goal Achievement: 04/11/12 Potential to Achieve Goals: Good PT Goal: Supine/Side to Sit - Progress: Met PT Goal: Sit to Stand - Progress: Progressing toward goal PT Goal: Stand to Sit - Progress: Progressing toward goal PT Goal: Ambulate - Progress: Progressing toward goal PT Goal: Up/Down Stairs - Progress: Met Additional Goals Additional Goal #1: Pt to achieve >19 on DGI to indicate decreased falls risk. PT Goal: Additional Goal #1 - Progress: Goal set today  Visit Information  Last PT Received On: 04/04/12 Assistance Needed: +1    Subjective Data  Subjective: Pt received in L sidelying agreeable to PT.   Cognition  Cognition Overall Cognitive Status: Appears within functional limits for tasks assessed/performed Arousal/Alertness: Awake/alert Orientation Level: Appears intact for tasks assessed Behavior During Session: [facility] for tasks performed    Balance  Dynamic Standing Balance Dynamic Standing - Balance Support: Bilateral upper extremity supported Dynamic Standing - Level of Assistance: 4: Min assist Dynamic Standing - Balance Activities:  (marching in place) Dynamic Standing - Comments: instructed pt to minimalize UE support on RW however pt requires minA to maintain balance  End of Session PT - End of Session Equipment Utilized During Treatment: Gait belt Activity  Tolerance: Patient tolerated treatment  well Patient left: in chair;with call bell/phone within reach;with family/visitor present Nurse Communication: Mobility status   GP     Marcene Brawn 04/04/2012, 4:38 PM  Lewis Shock, PT, DPT Pager #: [phone] Office #: [phone]

## 2012-04-04 NOTE — Discharge Summary (Signed)
Physician Discharge Summary  DISCHARGE SUMMARY   Patient ID: [name] MR#: [id number] DOB/AGE: 1934-05-29 78 y.o.   Attending Physician:Allexus Ovens M  Patient's WUJ:WJXBJ,YNWG M, MD  Consults:  PCCM  Admit date: 03/31/2012 Discharge date: 04/04/2012  Discharge Diagnoses:  Principal Problem:   Cerebellar stroke, acute Active Problems:   Hypertension   Hyperlipidemia   Obstructive sleep apnea   Diabetes mellitus   Ataxia following cerebral infarction   Nausea and vomiting   Polymyalgia rheumatica   Patient Active Problem List  Diagnosis  . Bifascicular block  . Hypertension  . Hyperlipidemia  . Obstructive sleep apnea  . Diabetes mellitus  . Cerebellar stroke, acute  . Ataxia following cerebral infarction  . Nausea and vomiting  . Polymyalgia rheumatica   Past Medical History  Diagnosis Date  . Hyperlipidemia   . Hypertension   . Obstructive sleep apnea   . Bifascicular block   . Diabetes mellitus   . Thyroid disease   . Hypothyroidism   . Gastric ulcer   . Squamous cell skin cancer   . Shortness of breath   . Stroke 03/31/2012  . GERD (gastroesophageal reflux disease)   . Kidney stones     HX OF  . Arthritis     Discharged Condition: good   Discharge Medications:   Medication List    TAKE these medications       acetaminophen 325 MG tablet  Commonly known as:  TYLENOL  Take 2 tablets (650 mg total) by mouth every 4 (four) hours as needed.     aspirin 325 MG tablet  Take 1 tablet (325 mg total) by mouth daily.     benazepril 20 MG tablet  Commonly known as:  LOTENSIN  Take 20 mg by mouth daily.     Cholecalciferol 5000 UNITS capsule  Take 5,000 Units by mouth daily.     donepezil 5 MG tablet  Commonly known as:  ARICEPT  Take 5 mg by mouth at bedtime.     folic acid 1 MG tablet  Commonly known as:  FOLVITE  Take 1 mg by mouth 2 (two) times daily.     levothyroxine 100 MCG tablet  Commonly known as:  SYNTHROID, LEVOTHROID   Take 100 mcg by mouth daily.     metFORMIN 500 MG tablet  Commonly known as:  GLUCOPHAGE  Take 500 mg by mouth daily with breakfast.     MYRBETRIQ PO  Take 25-50 mg by mouth daily. Try 25 mg sample dose once per day. If this does not help, try 50 mg sample dose once per day.     predniSONE 1 MG tablet  Commonly known as:  DELTASONE  Take 4 tablets (4 mg total) by mouth daily with breakfast.     rosuvastatin 10 MG tablet  Commonly known as:  CRESTOR  Take 2 tablets (20 mg total) by mouth daily.     senna-docusate 8.6-50 MG per tablet  Commonly known as:  Senokot-S  Take 1 tablet by mouth at bedtime as needed.     sertraline 100 MG tablet  Commonly known as:  ZOLOFT  Take 100 mg by mouth daily.     vitamin B-12 1000 MCG tablet  Commonly known as:  CYANOCOBALAMIN  Take 1,000 mcg by mouth daily.        Hospital Procedures: Dg Chest 2 View  04/01/2012  *RADIOLOGY REPORT*  Clinical Data: Nausea and emesis for 2 days.  CHEST - 2 VIEW  Comparison: 04/28/2010  Findings: Fibrosis or linear atelectasis in the left lung base. Probable emphysematous changes in the lungs. The heart size and pulmonary vascularity are normal. The lungs appear clear and expanded without focal air space disease or consolidation. No blunting of the costophrenic angles.  No pneumothorax.  Mediastinal contours appear intact.  No significant change since previous study.  IMPRESSION: Emphysematous changes.  Fibrosis or linear atelectasis in the left lung base.  Stable examination.  No evidence of active pulmonary disease.   Original Report Authenticated By: Burman Nieves, M.D.    Dg Abd 1 View  04/01/2012  *RADIOLOGY REPORT*  Clinical Data: Nausea and emesis for 2 days.  ABDOMEN - 1 VIEW  Comparison: CT abdomen and pelvis 04/29/2010  Findings: Scattered gas and stool in the colon.  Scattered gas in nondistended small bowel.  There is a loop of bowel in the left lower quadrant probably representing colon appears to  have some wall thickening.  Inflammatory or infiltrative process is not excluded.  No small or large bowel distension.  No radiopaque stones.  Degenerative changes in the spine and hips.  Vascular calcifications.  IMPRESSION: Nonobstructive bowel gas pattern although a suggestion of wall thickening and left lower quadrant bowel may suggest inflammatory or infectious colitis.   Original Report Authenticated By: Burman Nieves, M.D.    Mr Brain Wo Contrast  03/31/2012  *RADIOLOGY REPORT*  Clinical Data: Slurred speech.  Right-sided weakness.  MRI HEAD WITHOUT CONTRAST  Technique:  Multiplanar, multiecho pulse sequences of the brain and surrounding structures were obtained according to standard protocol without intravenous contrast.  Comparison: 06/09/2009  Findings: There is a 3 cm acute infarction within the right cerebellum.  There is mild swelling but no hemorrhage, mass effect or shift.  There are extensive chronic small vessel changes throughout the pons.  The cerebral hemispheres show atrophy with extensive chronic small vessel disease throughout the white matter.  No hydrocephalus.  No extra-axial collection.  No sign of mass lesion. No pituitary mass.  Sinuses, middle ears and mastoids are clear except for small amount of fluid in the right mastoid tip.  IMPRESSION: 3 cm acute infarction affecting the right cerebellum.  No mass effect or hemorrhage.  Extensive chronic small vessel changes elsewhere throughout the brain.  I discussed the case with Dr. Timothy Lasso at to 1435 hours.   Original Report Authenticated By: Paulina Fusi, M.D.    Mr Mra Head/brain Wo Cm  04/01/2012  *RADIOLOGY REPORT*  Clinical Data: Right cerebellar stroke  MRA HEAD WITHOUT CONTRAST  Technique: Angiographic images of the Circle of Willis were obtained using MRA technique without intravenous contrast.  Comparison: MRI brain 03/31/2012  Findings: Fetal origin of the posterior cerebral artery with hypoplastic posterior circulation.  Right  vertebral artery is patent to the basilar.  The basilar is small and the has a high- grade stenosis in the mid to distal portion.  Some this may be due to congenital hypoplasia versus atherosclerotic disease.  Left vertebral artery ends in pica and does not contribute to the basilar.  The basilar terminates in the superior cerebellar arteries. Small right cerebellar artery is   patent proximally with the distal vessel not seen.  Left superior cerebellar artery is difficult to see and may be very small or diseased.  Fetal origin of the posterior cerebral artery bilaterally with hypoplastic distal basilar and P1 segments.  Internal carotid artery is patent bilaterally.  Anterior and middle cerebral arteries are patent bilaterally without stenosis. Negative for aneurysm.  IMPRESSION:  Fetal origin of the posterior serve cerebral arteries with hypoplastic vertebral basilar system.  There is a very small distal basilar artery which could be due to atherosclerotic disease or congenital hypoplasia.  With regard to the patient's right superior cerebellar infarct, the right superior cerebral artery is patent proximally.   Original Report Authenticated By: Janeece Riggers, M.D.     History of Present Illness: Admitted 03/31/12 with Nausea, Ataxia, Dizzy, off balance, right-sided weakness without chest pain, some shortness of breath, having to concentrate and think and to focus on what she wants to say  History of diabetes Hgb A1C: 5.9  Had not checked her blood sugar or taken her meds in the 2-3 days PTA We saw her in the office and forced a stat MRI that eventually showed a cerebellar CVA.  We then proceeded with Admission and treatment.      Hospital Course: Admitted 03/31/12 with Cerebellar CVA, Ataxia and Nausea.  Slowly improving and ready for CIR/Rehab.  +BMs, (-) Ab pains. No Further Nausea.  1. Cerebellar stroke, acute- MRA shows no critical stenosis or need for additional intervention. Echo: Systolic function  was normal c EF 55% to 60% and grade 1 diastolic dysfunction, No Carotid Stenosis, MRA without issues, No Afib.  Will continue risk factor modification with DM2, BP, lipid control. Continue ASA (H/O Antral Ulcer 02/2009 - Low threshold to do Plavix 75 instead) for secondary stroke prevention. Continue ST/PT/OT- [facility] today on D/C. 2. Ataxia, Dysarthria/slurred speech and R sided Weakness - Improved slightly. Continue PT/OT/ST, fall precautions.  3. Hypertension- adequate control on home meds. Consider additional treatment if BP remains above 150/90.  4. Hyperlipidemia- LDL at goal less than 100 (ideal less than 70) on Crestor at home - Increase to 20 mg on D/c. TC 171/TG230/HDL40/LDL85.  5. Obstructive sleep apnea- has declined CPAP.  6. Diabetes mellitus- well controlled. Metformin restarted. A1C 5.8 - 6.5%  7. Nausea and vomiting- resolved- secondary to ataxia. KUB on the 7th: Nonobstructive bowel gas pattern although a suggestion of wall thickening and left lower quadrant bowel may suggest inflammatory or infectious colitis. Will monitor but no Sxs to suggest pathology.  8. Polymyalgia Rheumatica- continue steroids, She is down to 4 mg PO daily and was supposed to wean by 1 mg per month per Dr Corliss Skains..  9. Hypothyroid - On Meds. TSH is fine.  10. Depression - Setraline.  11. Disposition- CIR today 12. Overactive Bladder - on meds. 13 DVT Proph - Lovenox was done.    Day of Discharge Exam BP 146/72  Pulse 75  Temp(Src) 97.9 F (36.6 C) (Oral)  Resp 20  Ht 5' 2.99" (1.6 m)  Wt 95.255 kg (210 lb)  BMI 37.21 kg/m2  SpO2 95%  See PN for rest of PE.  Discharge Labs:  Recent Labs  04/02/12 0612  NA 139  K 3.9  CL 103  CO2 26  GLUCOSE 114*  BUN 23  CREATININE 1.15*  CALCIUM 9.2    Recent Labs  04/02/12 0612  AST 12  ALT 13  ALKPHOS 40  BILITOT 0.3  PROT 6.7  ALBUMIN 3.6   No results found for this basename: WBC, NEUTROABS, HGB, HCT, MCV, PLT,  in the  last 72 hours No results found for this basename: CKTOTAL, CKMB, CKMBINDEX, TROPONINI,  in the last 72 hours No results found for this basename: TSH, T4TOTAL, FREET3, T3FREE, THYROIDAB,  in the last 72 hours No results found for this basename: VITAMINB12, FOLATE, FERRITIN,  TIBC, IRON, RETICCTPCT,  in the last 72 hours Lab Results  Component Value Date   INR 0.98 03/27/2009   INR 0.94 02/28/2009       Discharge instructions:   Follow-up Information   Follow up with Gwen Pounds, MD In 2 weeks.   Contact information:   [address] [facility] [place] [address] [phone]        Disposition: CIR  Follow-up Appts: Follow-up with Dr. Timothy Lasso at [facility] in 2 weeks and after CIR D/C.  Call for appointment.  Condition on Discharge: stable  Tests Needing Follow-up: none  Time spent in discharge (includes decision making & examination of pt): 30 min.  Signed: Gwen Pounds 04/04/2012, 1:07 PM

## 2012-04-04 NOTE — Consult Note (Signed)
Physical Medicine and Rehabilitation Consult Reason for Consult: CVA Referring Physician: Dr. Timothy Lasso   HPI: [name] is a 78 y.o. right-handed female with history of polymyalgia rheumatica, hypertension as well as diabetes mellitus with peripheral neuropathy. Admitted 03/31/2012 with right-sided weakness and dizziness with associated nausea and vomiting. Patient reports that she had not taken her medications in the past 2 days because she just didn't want to take them. MRI of the brain showed a 3 cm acute infarct affecting the right cerebellum without hemorrhage. MRA of the head with atherosclerotic type disease. Echocardiogram with ejection fraction of 60% and grade 1 diastolic dysfunction. Carotid Dopplers with no ICA stenosis. Patient placed on aspirin therapy as well as subcutaneous Lovenox for DVT prophylaxis. Hemoglobin A1c is 6.5 and remains on Glucophage. Physical and occupational therapy evaluations completed an ongoing with reported issues of significant balance impairments and mild cognitive impairments with recommendations of physical medicine rehabilitation consult to consider inpatient rehabilitation services   Review of Systems  Respiratory: Positive for shortness of breath.   Gastrointestinal: Positive for nausea and vomiting.       Reflux  Musculoskeletal: Positive for myalgias and joint pain.  Neurological: Positive for dizziness and weakness.  All other systems reviewed and are negative.   Past Medical History  Diagnosis Date  . Hyperlipidemia   . Hypertension   . Obstructive sleep apnea   . Bifascicular block   . Diabetes mellitus   . Thyroid disease   . Hypothyroidism   . Gastric ulcer   . Squamous cell skin cancer   . Shortness of breath   . Stroke 03/31/2012  . GERD (gastroesophageal reflux disease)   . Kidney stones     HX OF  . Arthritis    Past Surgical History  Procedure Laterality Date  . Total knee arthroplasty      Left knee  . Tonsillectomy     . Appendectomy    . Hernia repair    . Rotator cuff repair      RT SHOULDER  . Wrist fracture surgery     Family History  Problem Relation Age of Onset  . Heart failure Mother    Social History:  reports that she quit smoking about 7 years ago. She has never used smokeless tobacco. She reports that  drinks alcohol. She reports that she does not use illicit drugs. Allergies: No Known Allergies Medications Prior to Admission  Medication Sig Dispense Refill  . benazepril (LOTENSIN) 20 MG tablet Take 20 mg by mouth daily.      . Cholecalciferol 5000 UNITS capsule Take 5,000 Units by mouth daily.      Marland Kitchen donepezil (ARICEPT) 5 MG tablet Take 5 mg by mouth at bedtime.      . folic acid (FOLVITE) 1 MG tablet Take 1 mg by mouth 2 (two) times daily.      Marland Kitchen levothyroxine (SYNTHROID, LEVOTHROID) 100 MCG tablet Take 100 mcg by mouth daily.      . metFORMIN (GLUCOPHAGE) 500 MG tablet Take 500 mg by mouth daily with breakfast.      . Mirabegron (MYRBETRIQ PO) Take 25-50 mg by mouth daily. Try 25 mg sample dose once per day. If this does not help, try 50 mg sample dose once per day.      . rosuvastatin (CRESTOR) 10 MG tablet Take 15 mg by mouth daily.      . sertraline (ZOLOFT) 100 MG tablet Take 100 mg by mouth daily.      Marland Kitchen  vitamin B-12 (CYANOCOBALAMIN) 1000 MCG tablet Take 1,000 mcg by mouth daily.        Home: Home Living Lives With: Son Available Help at Discharge: Available 24 hours/day Type of Home: House Home Access: Stairs to enter [employer] of Steps: 5 Entrance Stairs-Rails: Right;Left;Can reach both Home Layout: One level Bathroom Shower/Tub: Health visitor: Standard Home Adaptive Equipment: Straight cane;Walker - rolling;Wheelchair - manual Additional Comments: Son is currently home 24/7 as he is out of work.  However, he will need to go to interviews and accept a job as it becomes available and will therefore need to leave pt alone.  Functional  History: Prior Function Able to Take Stairs?: Yes Driving: Yes Vocation: Retired Functional Status:  Mobility: Bed Mobility Bed Mobility: Not assessed Supine to Sit: 6: Modified independent (Device/Increase time) Sitting - Scoot to [place] of Bed: 6: Modified independent (Device/Increase time) Transfers Transfers: Sit to Stand;Stand to Sit Sit to Stand: 4: Min assist;With upper extremity assist;From chair/3-in-1 Stand to Sit: 5: Supervision;With upper extremity assist;To chair/3-in-1 Ambulation/Gait Ambulation/Gait Assistance: 4: Min assist Ambulation Distance (Feet): 200 Feet Assistive device: Rolling walker;1 person hand held assist;None Ambulation/Gait Assistance Details: pt with antalgic gait when amb without device. trialed RW however pt with unsafe walker management due to inability to focus and decreased desire to use RW. With onset of fatigue pt became unsteady when amb without AD and had R knee instabilty/mild buckling requiring use of R hand held assist. Pt did reports she holds onto the wall alot at home. Gait Pattern: Step-through pattern;Decreased stride length;Antalgic;Wide base of support Gait velocity: slow General Gait Details: pt most stable with hand held assist due to pt's unsafe use of RW Stairs: Yes Stairs Assistance: 4: Min assist Stairs Assistance Details (indicate cue type and reason): pt with strong use of railing and hand held assist, v/c's for sequencing and to complete non-reciprocal pattern Stair Management Technique: One rail Left;Forwards (1 person HHA) Number of Stairs: 3    ADL: ADL Grooming: Brushing hair;Teeth care;Wash/dry hands;Supervision/safety Where Assessed - Grooming: Unsupported standing Upper Body Bathing: Simulated;Set up Where Assessed - Upper Body Bathing: Unsupported sitting Lower Body Bathing: Simulated;Min guard Where Assessed - Lower Body Bathing: Unsupported sitting Upper Body Dressing: Performed;Set up Where Assessed - Upper  Body Dressing: Unsupported sitting Lower Body Dressing: Simulated;Min guard Where Assessed - Lower Body Dressing: Unsupported sitting Toilet Transfer: Supervision/safety Toilet Transfer Method: Sit to stand Toilet Transfer Equipment: Comfort height toilet Equipment Used: Gait belt Transfers/Ambulation Related to ADLs: min assist with HHA x1.  Increased time and wide base of support.  ADL Comments: Denies dizziness, feels the pain in her R leg is making her off-balance.  Cognition: Cognition Overall Cognitive Status: Other (comment) (pt reports recently being told she has dementia, on aricept ) Arousal/Alertness: Awake/alert Orientation Level: Oriented X4 Attention: Selective Selective Attention: Appears intact Memory: Impaired Memory Impairment: Storage deficit;Retrieval deficit;Decreased recall of new information (1/4 Ind 1/4 Cat cue 2/4 m/c cue score 7/12) Awareness: Appears intact Problem Solving: Appears intact Safety/Judgment: Appears intact Cognition Overall Cognitive Status: Appears within functional limits for tasks assessed/performed Arousal/Alertness: Awake/alert Orientation Level: Appears intact for tasks assessed Behavior During Session: [facility] for tasks performed Cognition - Other Comments: pt easily distracted and with delayed processing/increased response time  Blood pressure 118/71, pulse 76, temperature 97.6 F (36.4 C), temperature source Oral, resp. rate 20, height 5' 2.99" (1.6 m), weight 95.255 kg (210 lb), SpO2 94.00%. Physical Exam  Vitals reviewed. Constitutional: She is oriented  to person, place, and time.  HENT:  Head: Normocephalic.  Eyes:  Pupils round and reactive to light  Neck: Neck supple. No thyromegaly present.  Cardiovascular: Normal rate and regular rhythm.   Pulmonary/Chest: Breath sounds normal. No respiratory distress.  Abdominal: Soft. Bowel sounds are normal. She exhibits no distension. There is no tenderness.  Neurological: She is alert  and oriented to person, place, and time. No cranial nerve deficit.  Patient follows simple commands. Noted decreased fine motor skills and coordination on the right side with mild ataxia, past pointing, decreased FMC of arm and leg. Truncal ataxia also with lean to the right especially when distracted.  Skin: Skin is warm and dry.  Psychiatric: She has a normal mood and affect. Her behavior is normal. Thought content normal.  Perhaps mild attention and awareness issues    Results for orders placed during the hospital encounter of 03/31/12 (from the past 24 hour(s))  GLUCOSE, CAPILLARY     Status: Abnormal   Collection Time    04/03/12  7:11 AM      Result Value Range   Glucose-Capillary 116 (*) 70 - 99 mg/dL   Comment 1 Documented in Chart     Comment 2 Notify RN    GLUCOSE, CAPILLARY     Status: Abnormal   Collection Time    04/03/12 11:05 AM      Result Value Range   Glucose-Capillary 104 (*) 70 - 99 mg/dL   Comment 1 Notify RN    GLUCOSE, CAPILLARY     Status: Abnormal   Collection Time    04/03/12  4:45 PM      Result Value Range   Glucose-Capillary 116 (*) 70 - 99 mg/dL  GLUCOSE, CAPILLARY     Status: Abnormal   Collection Time    04/03/12  9:08 PM      Result Value Range   Glucose-Capillary 136 (*) 70 - 99 mg/dL   Comment 1 Documented in Chart     Comment 2 Notify RN     No results found.   Assessment/Plan: Diagnosis: right cerebellar infarct with limb and truncal ataxia 1. Does the need for close, 24 hr/day medical supervision in concert with the patient's rehab needs make it unreasonable for this patient to be served in a less intensive setting? Yes 2. Co-Morbidities requiring supervision/potential complications: PMR, HTN, OSA, DM,  3. Due to bladder management, bowel management, safety, skin/wound care, disease management, medication administration, pain management and patient education, does the patient require 24 hr/day rehab nursing? Yes 4. Does the patient  require coordinated care of a physician, rehab nurse, PT (1-2 hrs/day, 5 days/week), OT (1-2 hrs/day, 5 days/week) and perhaps SLP to address physical and functional deficits in the context of the above medical diagnosis(es)? Yes Addressing deficits in the following areas: balance, endurance, locomotion, strength, transferring, bowel/bladder control, bathing, dressing, feeding, grooming, toileting, cognition and psychosocial support 5. Can the patient actively participate in an intensive therapy program of at least 3 hrs of therapy per day at least 5 days per week? Yes 6. The potential for patient to make measurable gains while on inpatient rehab is excellent 7. Anticipated functional outcomes upon discharge from inpatient rehab are mod I with PT, mod I with OT, mod I with SLP. 8. Estimated rehab length of stay to reach the above functional goals is: 7-10 days 9. Does the patient have adequate social supports to accommodate these discharge functional goals? Yes and Potentially 10. Anticipated D/C setting: Home  11. Anticipated post D/C treatments: HH therapy 12. Overall Rehab/Functional Prognosis: good  RECOMMENDATIONS: This patient's condition is appropriate for continued rehabilitative care in the following setting: CIR Patient has agreed to participate in recommended program. Yes Note that insurance prior authorization may be required for reimbursement for recommended care.  Comment:Rehab RN to follow up.   Ivory Broad, MD     04/04/2012

## 2012-04-04 NOTE — Plan of Care (Signed)
Overall Plan of Care [facility]) Patient Details Name: [name] MRN: [id number] DOB: September 25, 1934  Diagnosis:  Rehabilitation parietal cerebellar infarct causing ataxia  Co-morbidities: Hyperlipidemia  .  Hypertension  .  Obstructive sleep apnea  .  Bifascicular block  .  Diabetes mellitus  .  Thyroid disease  .  Hypothyroidism  .  Gastric ulcer  .  Squamous cell skin cancer  .  Shortness of breath  .  Stroke  03/31/2012  .  GERD (gastroesophageal reflux disease)  .  Kidney stones  HX OF  .  Arthritis    Functional Problem List  Patient demonstrates impairments in the following areas: Balance, Bladder, Bowel, Endurance, Medication Management, Motor, Nutrition, Pain, Safety, Sensory  and Skin Integrity  Basic ADL's: eating, grooming, bathing, dressing and toileting Advanced ADL's: simple meal preparation  Transfers:  bed mobility, bed to chair, toilet, tub/shower, car and floor Locomotion:  ambulation and stairs  Additional Impairments:  None, Communication  expression and Social Cognition   memory  Anticipated Outcomes Item Anticipated Outcome  Eating/Swallowing  Modified independence  Basic self-care  supervision  Tolieting  supervision  Bowel/Bladder  Continent of bowel and bladder with toileting modified independence  Transfers  Mod I  Locomotion  Mod I  Communication  Mod I-Supervision   Cognition  Mod I-Supervision   Pain  3 or less on scale of 1-10  Safety/Judgment  supervision  Other     Therapy Plan: PT Intensity: Minimum of 1-2 x/day ,45 to 90 minutes PT Frequency: 5 out of 7 days PT Duration Estimated Length of Stay: 7 days OT Intensity: Minimum of 1-2 x/day, 45 to 90 minutes OT Duration/Estimated Length of Stay: 5-7 days SLP Intensity: Minumum of 1-2 x/day, 30 to 90 minutes SLP Frequency: 5 out of 7 days SLP Duration/Estimated Length of Stay: 7 days    Team Interventions: Item RN PT OT SLP SW TR Other  Self Care/Advanced  ADL Retraining   x      Neuromuscular Re-Education  x x      Therapeutic Activities  x x x     UE/LE Strength Training/ROM  x x      UE/LE Coordination Activities  x x      Visual/Perceptual Remediation/Compensation         DME/Adaptive Equipment Instruction  x x      Therapeutic Exercise  x x      Balance/Vestibular Training  x x      Patient/Family Education x x x x     Cognitive Remediation/Compensation   x      Functional Mobility Training  x x      Ambulation/Gait Training  x       Cytogeneticist Facilitation    x     Bladder Management x        Bowel Management x        Disease Management/Prevention x  x      Pain Management x x x      Medication Management x   x     Skin Care/Wound Management x        Splinting/Orthotics         Discharge  Planning x x x x     Psychosocial Support x  x                             Team Discharge Planning: Destination: PT-Home ,OT- Home , SLP-Home Projected Follow-up: PT-Outpatient PT, OT-  Outpatient OT, SLP-None Projected Equipment Needs: PT-None recommended by PT, OT-  , SLP-None recommended by SLP Patient/family involved in discharge planning: PT- Patient;Family member/caregiver,  OT-Patient;Family member/caregiver, SLP-Patient  MD ELOS: 7 days Medical Rehab Prognosis:  Excellent Assessment: 78 year old female with diabetes and hypertension developed a right cerebellar thrombotic infarct causing ataxia and now requiring 24 7 rehabilitation RN, M.D., CIR level PT OT. He will focus on balance right-sided coordination. Medically will focus on blood pressure and blood sugar management.    See Team Conference Notes for weekly updates to the plan of care

## 2012-04-04 NOTE — H&P (Signed)
Physical Medicine and Rehabilitation Admission H&P    No chief complaint on file. : HPI: [name] is a 78 y.o. right-handed female with history of polymyalgia rheumatica, hypertension as well as diabetes mellitus with peripheral neuropathy. Admitted 03/31/2012 with right-sided weakness and dizziness with associated nausea and vomiting. Patient reports that she had not taken her medications in the past 2 days because she just didn't want to take them. MRI of the brain showed a 3 cm acute infarct affecting the right cerebellum without hemorrhage. MRA of the head with atherosclerotic type disease. Echocardiogram with ejection fraction of 60% and grade 1 diastolic dysfunction. Carotid Dopplers with no ICA stenosis. Patient placed on aspirin therapy as well as subcutaneous Lovenox for DVT prophylaxis. Hemoglobin A1c is 6.5 and remains on Glucophage. Physical and occupational therapy evaluations completed an ongoing with reported issues of significant balance impairments and mild cognitive impairments with recommendations of physical medicine rehabilitation consult to consider inpatient rehabilitation services. Patient was felt to be a candidate for inpatient rehabilitation services and was admitted for a comprehensive rehabilitation program after evaluation today.  Review of Systems  Respiratory: Positive for shortness of breath.  Gastrointestinal: Positive for nausea and vomiting.  Reflux  Musculoskeletal: Positive for myalgias and joint pain.  Neurological: Positive for dizziness and weakness. Mild depression/memory loss All other systems reviewed and are negative   Past Medical History  Diagnosis Date  . Hyperlipidemia   . Hypertension   . Obstructive sleep apnea   . Bifascicular block   . Diabetes mellitus   . Thyroid disease   . Hypothyroidism   . Gastric ulcer   . Squamous cell skin cancer   . Shortness of breath   . Stroke 03/31/2012  . GERD (gastroesophageal reflux disease)   .  Kidney stones     HX OF  . Arthritis    Past Surgical History  Procedure Laterality Date  . Total knee arthroplasty      Left knee  . Tonsillectomy    . Appendectomy    . Hernia repair    . Rotator cuff repair      RT SHOULDER  . Wrist fracture surgery     Family History  Problem Relation Age of Onset  . Heart failure Mother    Social History:  reports that she quit smoking about 7 years ago. She has never used smokeless tobacco. She reports that  drinks alcohol. She reports that she does not use illicit drugs. Allergies: No Known Allergies Medications Prior to Admission  Medication Sig Dispense Refill  . benazepril (LOTENSIN) 20 MG tablet Take 20 mg by mouth daily.      . Cholecalciferol 5000 UNITS capsule Take 5,000 Units by mouth daily.      Marland Kitchen donepezil (ARICEPT) 5 MG tablet Take 5 mg by mouth at bedtime.      . folic acid (FOLVITE) 1 MG tablet Take 1 mg by mouth 2 (two) times daily.      Marland Kitchen levothyroxine (SYNTHROID, LEVOTHROID) 100 MCG tablet Take 100 mcg by mouth daily.      . metFORMIN (GLUCOPHAGE) 500 MG tablet Take 500 mg by mouth daily with breakfast.      . Mirabegron (MYRBETRIQ PO) Take 25-50 mg by mouth daily. Try 25 mg sample dose once per day. If this does not help, try 50 mg sample dose once per day.      . rosuvastatin (CRESTOR) 10 MG tablet Take 15 mg by mouth daily.      Marland Kitchen  sertraline (ZOLOFT) 100 MG tablet Take 100 mg by mouth daily.      . vitamin B-12 (CYANOCOBALAMIN) 1000 MCG tablet Take 1,000 mcg by mouth daily.        Home: Home Living Lives With: Son Available Help at Discharge: Available 24 hours/day Type of Home: House Home Access: Stairs to enter [employer] of Steps: 5 Entrance Stairs-Rails: Right;Left;Can reach both Home Layout: One level Bathroom Shower/Tub: Health visitor: Standard Home Adaptive Equipment: Straight cane;Walker - rolling;Wheelchair - manual Additional Comments: Son is currently home 24/7 as he is  out of work.  However, he will need to go to interviews and accept a job as it becomes available and will therefore need to leave pt alone.   Functional History: Prior Function Able to Take Stairs?: Yes Driving: Yes Vocation: Retired  Functional Status:  Mobility: Bed Mobility Bed Mobility: Not assessed Supine to Sit: 6: Modified independent (Device/Increase time) Sitting - Scoot to [place] of Bed: 6: Modified independent (Device/Increase time) Transfers Transfers: Sit to Stand;Stand to Sit Sit to Stand: 4: Min assist;With upper extremity assist;From chair/3-in-1 Stand to Sit: 5: Supervision;With upper extremity assist;To chair/3-in-1 Ambulation/Gait Ambulation/Gait Assistance: 4: Min assist Ambulation Distance (Feet): 200 Feet Assistive device: Rolling walker;1 person hand held assist;None Ambulation/Gait Assistance Details: pt with antalgic gait when amb without device. trialed RW however pt with unsafe walker management due to inability to focus and decreased desire to use RW. With onset of fatigue pt became unsteady when amb without AD and had R knee instabilty/mild buckling requiring use of R hand held assist. Pt did reports she holds onto the wall alot at home. Gait Pattern: Step-through pattern;Decreased stride length;Antalgic;Wide base of support Gait velocity: slow General Gait Details: pt most stable with hand held assist due to pt's unsafe use of RW Stairs: Yes Stairs Assistance: 4: Min assist Stairs Assistance Details (indicate cue type and reason): pt with strong use of railing and hand held assist, v/c's for sequencing and to complete non-reciprocal pattern Stair Management Technique: One rail Left;Forwards (1 person HHA) Number of Stairs: 3    ADL: ADL Grooming: Brushing hair;Teeth care;Wash/dry hands;Supervision/safety Where Assessed - Grooming: Unsupported standing Upper Body Bathing: Simulated;Set up Where Assessed - Upper Body Bathing: Unsupported sitting Lower  Body Bathing: Simulated;Min guard Where Assessed - Lower Body Bathing: Unsupported sitting Upper Body Dressing: Performed;Set up Where Assessed - Upper Body Dressing: Unsupported sitting Lower Body Dressing: Simulated;Min guard Where Assessed - Lower Body Dressing: Unsupported sitting Toilet Transfer: Supervision/safety Toilet Transfer Method: Sit to stand Toilet Transfer Equipment: Comfort height toilet Equipment Used: Gait belt Transfers/Ambulation Related to ADLs: min assist with HHA x1.  Increased time and wide base of support.  ADL Comments: Denies dizziness, feels the pain in her R leg is making her off-balance.  Cognition: Cognition Overall Cognitive Status: Other (comment) (pt reports recently being told she has dementia, on aricept ) Arousal/Alertness: Awake/alert Orientation Level: Oriented X4 Attention: Selective Selective Attention: Appears intact Memory: Impaired Memory Impairment: Storage deficit;Retrieval deficit;Decreased recall of new information (1/4 Ind 1/4 Cat cue 2/4 m/c cue score 7/12) Awareness: Appears intact Problem Solving: Appears intact Safety/Judgment: Appears intact Cognition Overall Cognitive Status: Appears within functional limits for tasks assessed/performed Arousal/Alertness: Awake/alert Orientation Level: Appears intact for tasks assessed Behavior During Session: [facility] for tasks performed Cognition - Other Comments: pt easily distracted and with delayed processing/increased response time   Blood pressure 146/72, pulse 75, temperature 97.9 F (36.6 C), temperature source Oral, resp. rate 20, height  5' 2.99" (1.6 m), weight 95.255 kg (210 lb), SpO2 95.00%. Physical Exam  Vitals reviewed.  Constitutional: She is oriented to person, place, and time.  HENT:  Head: Normocephalic.  Eyes:  Pupils round and reactive to light Neck: Neck supple. No thyromegaly present.  Cardiovascular: Normal rate and regular rhythm. No murmurs Pulmonary/Chest:  Breath sounds normal. No respiratory distress. No wheezes, Abdominal: Soft. Bowel sounds are normal. She exhibits no distension. There is no tenderness.  Neurological: She is alert and oriented to person, place, and time. No cranial nerve deficit.  Patient follows simple commands. Noted decreased fine motor skills and coordination on the right side with mild ataxia, past pointing, decreased FMC of arm and leg. Truncal ataxia also with lean to the right especially when distracted.  Skin: Skin is warm and dry.  Psychiatric: She has a normal mood and affect. Her behavior is normal. Thought content normal.  Perhaps mild attention and awareness issues    Results for orders placed during the hospital encounter of 03/31/12 (from the past 48 hour(s))  GLUCOSE, CAPILLARY     Status: Abnormal   Collection Time    04/02/12 11:37 AM      Result Value Range   Glucose-Capillary 135 (*) 70 - 99 mg/dL   Comment 1 Documented in Chart    GLUCOSE, CAPILLARY     Status: Abnormal   Collection Time    04/02/12  4:11 PM      Result Value Range   Glucose-Capillary 115 (*) 70 - 99 mg/dL   Comment 1 Documented in Chart     Comment 2 Notify RN    GLUCOSE, CAPILLARY     Status: Abnormal   Collection Time    04/02/12  9:24 PM      Result Value Range   Glucose-Capillary 104 (*) 70 - 99 mg/dL  GLUCOSE, CAPILLARY     Status: Abnormal   Collection Time    04/03/12  7:11 AM      Result Value Range   Glucose-Capillary 116 (*) 70 - 99 mg/dL   Comment 1 Documented in Chart     Comment 2 Notify RN    GLUCOSE, CAPILLARY     Status: Abnormal   Collection Time    04/03/12 11:05 AM      Result Value Range   Glucose-Capillary 104 (*) 70 - 99 mg/dL   Comment 1 Notify RN    GLUCOSE, CAPILLARY     Status: Abnormal   Collection Time    04/03/12  4:45 PM      Result Value Range   Glucose-Capillary 116 (*) 70 - 99 mg/dL  GLUCOSE, CAPILLARY     Status: Abnormal   Collection Time    04/03/12  9:08 PM      Result Value  Range   Glucose-Capillary 136 (*) 70 - 99 mg/dL   Comment 1 Documented in Chart     Comment 2 Notify RN    GLUCOSE, CAPILLARY     Status: Abnormal   Collection Time    04/04/12  7:19 AM      Result Value Range   Glucose-Capillary 116 (*) 70 - 99 mg/dL   Comment 1 Documented in Chart     Comment 2 Notify RN    GLUCOSE, CAPILLARY     Status: Abnormal   Collection Time    04/04/12 10:54 AM      Result Value Range   Glucose-Capillary 125 (*) 70 - 99 mg/dL   No  results found.  Post Admission Physician Evaluation: 1. Functional deficits secondary  to thrombotic, right cerebellar infarct. 2. Patient is admitted to receive collaborative, interdisciplinary care between the physiatrist, rehab nursing staff, and therapy team. 3. Patient's level of medical complexity and substantial therapy needs in context of that medical necessity cannot be provided at a lesser intensity of care such as a SNF. 4. Patient has experienced substantial functional loss from his/her baseline which was documented above under the "Functional History" and "Functional Status" headings.  Judging by the patient's diagnosis, physical exam, and functional history, the patient has potential for functional progress which will result in measurable gains while on inpatient rehab.  These gains will be of substantial and practical use upon discharge  in facilitating mobility and self-care at the household level. 5. Physiatrist will provide 24 hour management of medical needs as well as oversight of the therapy plan/treatment and provide guidance as appropriate regarding the interaction of the two. 6. 24 hour rehab nursing will assist with bladder management, bowel management, safety, skin/wound care, disease management, medication administration, pain management and patient education  and help integrate therapy concepts, techniques,education, etc. 7. PT will assess and treat for:  Lower extremity strength, range of motion, stamina,  balance, functional mobility, safety, adaptive techniques and equipment, NMR, vestibular deficits.  Goals are: mod I. 8. OT will assess and treat for: ADL's, functional mobility, safety, upper extremity strength, adaptive techniques and equipment, NMR, vestibular deficits, education.   Goals are: mod I to occasional supervision. 9. SLP will assess and treat for: n/a.  Goals are: n/a. 10. Case Management and Social Worker will assess and treat for psychological issues and discharge planning. 11. Team conference will be held weekly to assess progress toward goals and to determine barriers to discharge. 12. Patient will receive at least 3 hours of therapy per day at least 5 days per week. 13. ELOS: 7-10 days      Prognosis:  excellent   Medical Problem List and Plan: 1. Right thrombotic cerebellar infarct 2.. DVT Prophylaxis/Anticoagulation: Subcutaneous Lovenox. Monitor platelet counts and any signs of bleeding 3. Mood: Zoloft 100 mg daily, Aricept 5 mg each bedtime. Provide emotional support and positive reinforcement 4. Neuropsych: This patient is capable of making decisions on his/her own behalf. 5. Diabetes mellitus with peripheral neuropathy. Hemoglobin A1c 6.5. Glucophage 500 mg daily. Check CBGs a.c. and at bedtime 6. Hypertension. Lotensin 20 mg daily. Monitor with increased activity 7. Polymyalgia rheumatica. Prednisone 4 mg daily. Monitor for any signs of flare up 8. Hypothyroidism. Synthroid daily 9. Hyperlipidemia. Lipitor  Ranelle Oyster, MD, [place] [facility]   04/04/2012

## 2012-04-04 NOTE — PMR Pre-admission (Signed)
PMR Admission Coordinator Pre-Admission Assessment  Patient: [name] is an 78 y.o., female MRN: [id number] DOB: 1934/03/04 Height: 5' 2.99" (160 cm) Weight: 95.255 kg (210 lb)              Insurance Information Primary: Medicare Policy#: [id number] Subscriber: Patient Benefits: [place] Effective Date: 11/22/01  Deductible: $1126 OOP Max: None Life Max: Unlimited CIR: 100% SNF: 100 days LBD:  Outpatient: 80% Copay: 20%  Home Health: 100% DME: 80%  Copay: 20% Providers: Patient's choice  SECONDARY:[employer]     Policy#: [id number]      Subscriber: self    Emergency Contact Information Contact Information   Name Relation Home Work Mobile   [place] Daughter [phone]     Belia, Febo [phone]       Current Medical History  Patient Admitting Diagnosis: Right cerebellar infarct with limb and truncal ataxia  History of Present Illness:77 y.o. right-handed female with history of polymyalgia rheumatica, hypertension as well as diabetes mellitus with peripheral neuropathy. Admitted 03/31/2012 with right-sided weakness and dizziness with associated nausea and vomiting. Patient reports that she had not taken her medications in the past 2 days because she just didn't want to take them. MRI of the brain showed a 3 cm acute infarct affecting the right cerebellum without hemorrhage. MRA of the head with atherosclerotic type disease. Echocardiogram with ejection fraction of 60% and grade 1 diastolic dysfunction. Carotid Dopplers with no ICA stenosis. Patient placed on aspirin therapy as well as subcutaneous Lovenox for DVT prophylaxis. Hemoglobin A1c is 6.5 and remains on Glucophage. Physical and occupational therapy evaluations completed an ongoing with reported issues of significant balance impairments and mild cognitive impairments.  Total: 3   Past Medical History  Past Medical History  Diagnosis Date  . Hyperlipidemia   . Hypertension   . Obstructive sleep  apnea   . Bifascicular block   . Diabetes mellitus   . Thyroid disease   . Hypothyroidism   . Gastric ulcer   . Squamous cell skin cancer   . Shortness of breath   . Stroke 03/31/2012  . GERD (gastroesophageal reflux disease)   . Kidney stones     HX OF  . Arthritis     Family History  family history includes Heart failure in her mother.  Prior Rehab/Hospitalizations: Outpatient rehab  Current Medications  Current facility-administered medications:acetaminophen (TYLENOL) suppository 650 mg, 650 mg, Rectal, Q4H PRN, Gwen Pounds, MD;  acetaminophen (TYLENOL) tablet 650 mg, 650 mg, Oral, Q4H PRN, Gwen Pounds, MD, 650 mg at 04/01/12 0700;  aspirin tablet 325 mg, 325 mg, Oral, Daily, Gwen Pounds, MD, 325 mg at 04/04/12 1040;  atorvastatin (LIPITOR) tablet 40 mg, 40 mg, Oral, q1800, Gwen Pounds, MD, 40 mg at 04/03/12 1753 benazepril (LOTENSIN) tablet 20 mg, 20 mg, Oral, Daily, Gwen Pounds, MD, 20 mg at 04/04/12 1039;  cholecalciferol (VITAMIN D) tablet 5,000 Units, 5,000 Units, Oral, Daily, Gwen Pounds, MD, 5,000 Units at 04/04/12 1039;  donepezil (ARICEPT) tablet 5 mg, 5 mg, Oral, QHS, Gwen Pounds, MD, 5 mg at 04/03/12 2111;  enoxaparin (LOVENOX) injection 40 mg, 40 mg, Subcutaneous, Q24H, Gwen Pounds, MD, 40 mg at 04/03/12 1614 folic acid (FOLVITE) tablet 1 mg, 1 mg, Oral, BID, Gwen Pounds, MD, 1 mg at 04/04/12 1040;  insulin aspart (novoLOG) injection 0-9 Units, 0-9 Units, Subcutaneous, TID WC, Gwen Pounds, MD;  levothyroxine (SYNTHROID, LEVOTHROID) tablet 100 mcg, 100 mcg, Oral, QAC breakfast, Jonny Ruiz  Bo Mcclintock, MD, 100 mcg at 04/04/12 0755;  metFORMIN (GLUCOPHAGE) tablet 500 mg, 500 mg, Oral, Q breakfast, Gwen Pounds, MD, 500 mg at 04/04/12 0755 ondansetron [facility]) injection 4 mg, 4 mg, Intravenous, Q6H PRN, Gwen Pounds, MD;  predniSONE (DELTASONE) tablet 4 mg, 4 mg, Oral, Q breakfast, Gwen Pounds, MD, 4 mg at 04/04/12 0755;  senna-docusate (Senokot-S) tablet 1 tablet, 1 tablet, Oral,  QHS PRN, Gwen Pounds, MD;  sertraline (ZOLOFT) tablet 100 mg, 100 mg, Oral, Daily, Gwen Pounds, MD, 100 mg at 04/04/12 1040 vitamin B-12 (CYANOCOBALAMIN) tablet 1,000 mcg, 1,000 mcg, Oral, Daily, Gwen Pounds, MD, 1,000 mcg at 04/04/12 1040  Patients Current Diet: Carb Control  Precautions / Restrictions Precautions Precautions: Fall Restrictions Weight Bearing Restrictions: No   Prior Activity Level Community (5-7x/wk): yes Home Assistive Devices / Equipment Home Assistive Devices/Equipment: None Home Adaptive Equipment: Straight cane;Walker - rolling;Wheelchair - manual  Prior Functional Level Prior Function Level of Independence: Independent Able to Take Stairs?: Yes Driving: Yes Vocation: Retired  Current Functional Level Cognition  Arousal/Alertness: Awake/alert Overall Cognitive Status: Other (comment) (pt reports recently being told she has dementia, on aricept ) Overall Cognitive Status: Appears within functional limits for tasks assessed/performed Orientation Level: Oriented X4 Cognition - Other Comments: pt easily distracted and with delayed processing/increased response time Attention: Selective Selective Attention: Appears intact Memory: Impaired Memory Impairment: Storage deficit;Retrieval deficit;Decreased recall of new information (1/4 Ind 1/4 Cat cue 2/4 m/c cue score 7/12) Awareness: Appears intact Problem Solving: Appears intact Safety/Judgment: Appears intact    Extremity Assessment (includes Sensation/Coordination)  RUE ROM/Strength/Tone: WFL for tasks assessed RUE Sensation: WFL - Light Touch;WFL - Proprioception RUE Coordination: Deficits RUE Coordination Deficits: decreased accuracy and increased time for gross motor movement  RLE ROM/Strength/Tone: WFL for tasks assessed RLE Sensation: WFL - Light Touch    ADLs  Grooming: Brushing hair;Teeth care;Wash/dry hands;Supervision/safety Where Assessed - Grooming: Unsupported standing Upper Body  Bathing: Simulated;Set up Where Assessed - Upper Body Bathing: Unsupported sitting Lower Body Bathing: Simulated;Min guard Where Assessed - Lower Body Bathing: Unsupported sitting Upper Body Dressing: Performed;Set up Where Assessed - Upper Body Dressing: Unsupported sitting Lower Body Dressing: Simulated;Min guard Where Assessed - Lower Body Dressing: Unsupported sitting Toilet Transfer: Supervision/safety Toilet Transfer Method: Sit to Barista: Comfort height toilet Toileting - Clothing Manipulation and Hygiene: Modified independent Where Assessed - Engineer, mining and Hygiene: Sit to stand from 3-in-1 or toilet Equipment Used: Gait belt Transfers/Ambulation Related to ADLs: min assist with HHA x1.  Increased time and wide base of support.  ADL Comments: Denies dizziness, feels the pain in her R leg is making her off-balance.    Mobility  Bed Mobility: Not assessed Supine to Sit: 6: Modified independent (Device/Increase time) Sitting - Scoot to Edge of Bed: 6: Modified independent (Device/Increase time)    Transfers  Transfers: Sit to Stand;Stand to Sit Sit to Stand: 4: Min assist;With upper extremity assist;From chair/3-in-1 Stand to Sit: 5: Supervision;With upper extremity assist;To chair/3-in-1    Ambulation / Gait / Stairs / Wheelchair Mobility  Ambulation/Gait Ambulation/Gait Assistance: 4: Min Environmental consultant (Feet): 200 Feet Assistive device: Rolling walker;1 person hand held assist;None Ambulation/Gait Assistance Details: pt with antalgic gait when amb without device. trialed RW however pt with unsafe walker management due to inability to focus and decreased desire to use RW. With onset of fatigue pt became unsteady when amb without AD and had R knee instabilty/mild buckling requiring use  of R hand held assist. Pt did reports she holds onto the wall alot at home. Gait Pattern: Step-through pattern;Decreased stride  length;Antalgic;Wide base of support Gait velocity: slow General Gait Details: pt most stable with hand held assist due to pt's unsafe use of RW Stairs: Yes Stairs Assistance: 4: Min assist Stairs Assistance Details (indicate cue type and reason): pt with strong use of railing and hand held assist, v/c's for sequencing and to complete non-reciprocal pattern Stair Management Technique: One rail Left;Forwards (1 person HHA) Number of Stairs: 3    Posture / Balance Static Standing Balance Static Standing - Balance Support: Right upper extremity supported Static Standing - Level of Assistance: 5: Stand by assistance Dynamic Standing Balance Dynamic Standing - Balance Support: Right upper extremity supported Dynamic Standing - Level of Assistance: 4: Min assist Dynamic Gait Index Level Surface: Mild Impairment Change in Gait Speed: Moderate Impairment Gait with Horizontal Head Turns: Moderate Impairment Gait with Vertical Head Turns: Moderate Impairment Gait and Pivot Turn: Moderate Impairment Step Over Obstacle: Moderate Impairment Step Around Obstacles: Mild Impairment Steps: Moderate Impairment Total Score: 10    Special needs/care consideration Skin: WDL                               Bowel mgmt:LBM 2/10 Bladder mgmt:WDL Diabetic mgmt: yes Medication education  Safety   Previous Home Environment Living Arrangements: Children Lives With: Son Available Help at Discharge: Available 24 hours/day Type of Home: House Home Layout: One level Home Access: Stairs to enter Entrance Stairs-Rails: Right;Left;Can reach both Secretary/administrator of Steps: 5 Bathroom Shower/Tub: Health visitor: Standard Home Care Services: No Additional Comments: Son is currently home 24/7 as he is out of work.  However, he will need to go to interviews and accept a job as it becomes available and will therefore need to leave pt alone.  Discharge Living Setting Plans for Discharge  Living Setting: Patient's home Type of Home at Discharge: House Discharge Home Layout: One level Discharge Home Access: Stairs to enter Entrance Stairs-Rails: Can reach both Entrance Stairs-Number of Steps: 5 Discharge Bathroom Shower/Tub: Walk-in shower Discharge Bathroom Toilet: Standard Discharge Bathroom Accessibility: Yes Do you have any problems obtaining your medications?: No  Social/Family/Support Systems Patient Roles: Parent: Son lives with patient. Also has local daughter. Contact Information: [phone] Anticipated Caregiver: Son: Creola Corn.Anticipated Caregiver's Contact Information: [phone] Ability/Limitations of Caregiver: none Caregiver Availability: Intermittent Discharge Plan Discussed with Primary Caregiver: Yes Is Caregiver In Agreement with Plan?: Yes Does Caregiver/Family have Issues with Lodging/Transportation while Pt is in Rehab?: No  Goals/Additional Needs Patient/Family Goal for Rehab: Modified independent Expected length of stay: 7 days Cultural Considerations: none Dietary Needs: Carb modified Equipment Needs: TBD Pt/Family Agrees to Admission and willing to participate: Yes Program Orientation Provided & Reviewed with Pt/Caregiver Including Roles  & Responsibilities: Yes  Decrease burden of Care through IP rehab admission: goals are mod independent  Possible need for SNF placement upon discharge: no  Patient Condition: This patient's condition remains as documented in the Consult dated 04/04/12, in which the Rehabilitation Physician determined and documented that the patient's condition is appropriate for intensive rehabilitative care in an inpatient rehabilitation facility.  Preadmission Screen Completed By:  Meryl Dare, 04/04/2012 11:50 AM ______________________________________________________________________   Discussed status with Dr. Riley Kill on 04/04/12 at 12:00 PM and received telephone approval for admission today.  Admission  Coordinator:  Meryl Dare, time12:00/Date 04/04/12

## 2012-04-04 NOTE — Progress Notes (Signed)
Patient's family came in to see patient and discussed patient's sister in law passing away. Patient upset and crying. Family asked if she could have medication to help calm her. Paged Deatra Ina, PA, order received for xanax 0.5 mg po TID prn. [name], Alysiah Suppa Elon Jester

## 2012-04-04 NOTE — Progress Notes (Signed)
Patient admitted to 4142 via wheelchair accompanied by her son. Oriented to room, bed control, call light, CMS date sheet, stroke education booklet information, white board, team conference, therapy schedule, preferred name, rehab goal, meal times,rehab safety plan, agreement, safety video. see CHL for assessment details. [name], [name]

## 2012-04-04 NOTE — Interval H&P Note (Signed)
[name] was admitted today to Inpatient Rehabilitation with the diagnosis of right cerebellar infarct.  The patient's history has been reviewed, patient examined, and there is no change in status.  Patient continues to be appropriate for intensive inpatient rehabilitation.  I have reviewed the patient's chart and labs.  Questions were answered to the patient's satisfaction.  SWARTZ,ZACHARY T 04/04/2012, 10:41 PM

## 2012-04-04 NOTE — Progress Notes (Signed)
Met with patient and her daughter to discuss potential CIR admission. Pt would benefit from inpatient rehab prior to return home. Pt's son lives with her but can only provide intermittent supervision. Pt will need to be mod independent for safe d/c to home. Will determine CIR bed availability. Encouraged pt and daughter to think about alternatives for d/c if CIR not available. Informed pt's CM and will follow-up. Please call [phone] for questions.

## 2012-04-04 NOTE — Progress Notes (Signed)
Admitting patient to CIR today. Dr Timothy Lasso reports pt is ready for inpatient rehab and pt and daughter are in agreement with plan. Pt's CM is aware. Please call [phone] for questions.

## 2012-04-05 ENCOUNTER — Inpatient Hospital Stay (HOSPITAL_COMMUNITY): Payer: Medicare Other | Admitting: Speech Pathology

## 2012-04-05 ENCOUNTER — Inpatient Hospital Stay (HOSPITAL_COMMUNITY): Payer: Medicare Other | Admitting: Physical Therapy

## 2012-04-05 ENCOUNTER — Inpatient Hospital Stay (HOSPITAL_COMMUNITY): Payer: Medicare Other | Admitting: Occupational Therapy

## 2012-04-05 DIAGNOSIS — I6330 Cerebral infarction due to thrombosis of unspecified cerebral artery: Secondary | ICD-10-CM

## 2012-04-05 LAB — CBC WITH DIFFERENTIAL/PLATELET
Basophils Absolute: 0 10*3/uL (ref 0.0–0.1)
Basophils Relative: 1 % (ref 0–1)
HCT: 43.1 % (ref 36.0–46.0)
Lymphocytes Relative: 38 % (ref 12–46)
Monocytes Absolute: 0.7 10*3/uL (ref 0.1–1.0)
Neutro Abs: 3.4 10*3/uL (ref 1.7–7.7)
Neutrophils Relative %: 48 % (ref 43–77)
RDW: 13.6 % (ref 11.5–15.5)
WBC: 7.2 10*3/uL (ref 4.0–10.5)

## 2012-04-05 LAB — COMPREHENSIVE METABOLIC PANEL
ALT: 29 U/L (ref 0–35)
AST: 27 U/L (ref 0–37)
Albumin: 3.6 g/dL (ref 3.5–5.2)
Alkaline Phosphatase: 44 U/L (ref 39–117)
CO2: 27 mEq/L (ref 19–32)
Chloride: 102 mEq/L (ref 96–112)
Creatinine, Ser: 1.32 mg/dL — ABNORMAL HIGH (ref 0.50–1.10)
GFR calc non Af Amer: 38 mL/min — ABNORMAL LOW (ref >90)
Potassium: 4.4 mEq/L (ref 3.5–5.1)
Sodium: 138 mEq/L (ref 135–145)
Total Bilirubin: 0.3 mg/dL (ref 0.3–1.2)

## 2012-04-05 LAB — GLUCOSE, CAPILLARY
Glucose-Capillary: 96 mg/dL (ref 70–99)
Glucose-Capillary: 83 mg/dL (ref 70–99)
Glucose-Capillary: 114 mg/dL — ABNORMAL HIGH (ref 70–99)

## 2012-04-05 NOTE — Progress Notes (Signed)
Rec'd call from unit nurse that pt had had a stroke (a few days ago) and was recently moved to unit for rehab. Pt had just been told by daughter that her sister had just passed. Pt was very upset and still tearful when I arrived. Pt was comforted by holding of hands and talking about her loved one and family. At the end of visit, pt and family rec'd prayer. They were very grateful for visit and prayer.  [name]

## 2012-04-05 NOTE — Progress Notes (Signed)
Occupational Therapy Session Note  Patient Details  Name: [name] MRN: [id number] Date of Birth: 1934/03/18  Today's Date: 04/05/2012 Time: 1131-1200 Time Calculation (min): 29 min  Short Term Goals: Week 1:     Skilled Therapeutic Interventions/Progress Updates:    Pt seen for 1:1 OT session with focus on functional mobility and transfers. Pt completed sit->stand and ambulated from room to ADL apartment with hand held assist and occasional steady assist. Pt reports having a walk-in shower at home, and a shower seat without a back. Pt completed shower transfer with simulated ledge to step over with steadying assist. Pt reported having a low height toilet at home, completed simulated toilet transfer from low surface with close supervision. Completed bed mobility getting into high height bed with supervision. Ambulation returning to room with ocasional steady assist. Pt with some difficulty word finding this session, and was tearful when speaking about upcoming funeral.  Therapy Documentation Precautions:  Precautions Precautions: Fall Restrictions Weight Bearing Restrictions: No    Pain: No pain reported this session      See FIM for current functional status  Therapy/Group: Individual Therapy  [name] 04/05/2012, 12:20 PM

## 2012-04-05 NOTE — Progress Notes (Signed)
Social Work Assessment and Plan Social Work Assessment and Plan  Patient Details  Name: [name] MRN: [id number] Date of Birth: [birth year]  Today's Date: 04/05/2012  Problem List:  Patient Active Problem List  Diagnosis  . Bifascicular block  . Hypertension  . Hyperlipidemia  . Obstructive sleep apnea  . Diabetes mellitus  . Cerebellar stroke, acute  . Ataxia following cerebral infarction  . Nausea and vomiting  . Polymyalgia rheumatica  . CVA (cerebral infarction)   Past Medical History:  Past Medical History  Diagnosis Date  . Hyperlipidemia   . Hypertension   . Obstructive sleep apnea   . Bifascicular block   . Diabetes mellitus   . Thyroid disease   . Hypothyroidism   . Gastric ulcer   . Squamous cell skin cancer   . Shortness of breath   . Stroke 03/31/2012  . GERD (gastroesophageal reflux disease)   . Kidney stones     HX OF  . Arthritis    Past Surgical History:  Past Surgical History  Procedure Laterality Date  . Total knee arthroplasty      Left knee  . Tonsillectomy    . Appendectomy    . Hernia repair    . Rotator cuff repair      RT SHOULDER  . Wrist fracture surgery     Social History:  reports that she quit smoking about 7 years ago. She has never used smokeless tobacco. She reports that  drinks alcohol. She reports that she does not use illicit drugs.  Family / Support Systems Marital Status: Widow/Widower Patient Roles: Parent Children: [name] [phone] Other Supports: [name]  [phone] Anticipated Caregiver: jeffrey Ability/Limitations of Caregiver: Son is currently looking for a job,so pt may be alone at times Caregiver Availability: 24/7 Family Dynamics: Close knit family who are close and involved in each other's lives.  SOn is currenlty living with Mom until he gets back on his feet.  Pt is appreciatative of her children and their support.  Social History Preferred language:  English Religion: Catholic Cultural Background: No issues Education: High School Read: Yes Write: Yes Employment Status: Retired Fish farm manager Issues: No issues Guardian/Conservator: None-according to MD pt is capable of making her own decisions   Abuse/Neglect Physical Abuse: Denies Verbal Abuse: Denies Sexual Abuse: Denies Exploitation of patient/patient's resources: Denies Self-Neglect: Denies  Emotional Status Pt's affect, behavior adn adjustment status: Pt is motivated and is encouraged by the progress she has made already.  She wants to go to her sister's in-law's funeral on Sat.  She feels this is pushing her, since she is the last of them to die. Recent Psychosocial Issues: Other medical issues-recent death of her sister in-law Pyschiatric History: No history-depression screen score-3.  She feels by going to the funeral and seeing her family it will help her mental health.  She reports: " I need to do this fo her, she would for me."  Aware of grief services available-pt's son is a Child psychotherapist also Substance Abuse History: No issues  Patient / Family Perceptions, Expectations & Goals Pt/Family understanding of illness & functional limitations: Pt and son have a good understanding of her stroke and deficits.  Pt is noticing word finding issues and wants to address with Speech Therapist when see's today.   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, retiree, Home owner, Mining engineer, etc Anticipated changes in roles/activities/participation: resume Pt/family expectations/goals: Pt states: " I am glad to be doing so well, but sad over  my sister in-law."  Son reports: " She is doing so well, hope it continues."  [employer]: None Premorbid Home Care/DME Agencies: None Transportation available at discharge: family Resource referrals recommended: Support group (specify) ([facility] Support group)  Discharge Planning Living Arrangements:  Children Support Systems: Children;Other relatives;Friends/neighbors;Church/faith community Type of Residence: Private residence Insurance Resources: [employer] Financial Resources: Restaurant manager, fast food Screen Referred: No Living Expenses: Lives with family Money Management: Patient Do you have any problems obtaining your medications?: No Home Management: Self and son Patient/Family Preliminary Plans: Return home with son who can be there for a short time.  He is currenlty looking for a job.  Daughter will come by and check on her also. Social Work Anticipated Follow Up Needs: HH/OP;Support Group DC Planning Additional Notes/Comments: If possible discharge Sat so pt can attend her sister in-law's funeral.  She will do what is needed.  Clinical Impression Pleasant female who has had to deal with numerous stressors in a short period of time, ie, sister in-laws death and pt's stroke.  She is open when discussing her feelings about her stroke and Death in her family members.  She is making good progress, check with team regarding discharge date-how soon.  [name] 04/05/2012, 12:31 PM

## 2012-04-05 NOTE — Progress Notes (Signed)
Patient ID: [name], female   DOB: 24-Jul-1934, 78 y.o.   MRN: [id number]  Subjective/Complaints: 78 y.o. right-handed female with history of polymyalgia rheumatica, hypertension as well as diabetes mellitus with peripheral neuropathy. Admitted 03/31/2012 with right-sided weakness and dizziness with associated nausea and vomiting. Patient reports that she had not taken her medications in the past 2 days because she just didn't want to take them. MRI of the brain showed a 3 cm acute infarct affecting the right cerebellum without hemorrhage. MRA of the head with atherosclerotic type disease. Echocardiogram with ejection fraction of 60% and grade 1 diastolic dysfunction. Carotid Dopplers with no ICA stenosis. Patient placed on aspirin therapy as well as subcutaneous Lovenox for DVT prophylaxis No complaints ,tolerating PT Son would like pt to attend a funeral on Sat  Review of Systems  Respiratory: Positive for shortness of breath.        With ambulation  Neurological: Positive for dizziness and focal weakness.       Right Lower extremity chronic weakness  All other systems reviewed and are negative.     Objective: Vital Signs: Blood pressure 133/97, pulse 73, temperature 97.5 F (36.4 C), temperature source Oral, resp. rate 19, height 5\' 3"  (1.6 m), weight 95.7 kg (210 lb 15.7 oz), SpO2 94.00%. No results found. Results for orders placed during the hospital encounter of 04/04/12 (from the past 72 hour(s))  CBC     Status: None   Collection Time    04/04/12  6:34 PM      Result Value Range   WBC 8.3  4.0 - 10.5 K/uL   RBC 4.46  3.87 - 5.11 MIL/uL   Hemoglobin 13.8  12.0 - 15.0 g/dL   HCT 30.8  65.7 - 84.6 %   MCV 93.9  78.0 - 100.0 fL   MCH 30.9  26.0 - 34.0 pg   MCHC 32.9  30.0 - 36.0 g/dL   RDW 96.2  95.2 - 84.1 %   Platelets 187  150 - 400 K/uL  CREATININE, SERUM     Status: Abnormal   Collection Time    04/04/12  6:34 PM      Result Value Range   Creatinine, Ser 1.63 (*)  0.50 - 1.10 mg/dL   GFR calc non Af Amer 30 (*) >90 mL/min   GFR calc Af Amer 34 (*) >90 mL/min   Comment:            The eGFR has been calculated     using the CKD EPI equation.     This calculation has not been     validated in all clinical     situations.     eGFR's persistently     <90 mL/min signify     possible Chronic Kidney Disease.  GLUCOSE, CAPILLARY     Status: Abnormal   Collection Time    04/04/12  8:43 PM      Result Value Range   Glucose-Capillary 110 (*) 70 - 99 mg/dL   Comment 1 Notify RN    CBC WITH DIFFERENTIAL     Status: None   Collection Time    04/05/12  5:32 AM      Result Value Range   WBC 7.2  4.0 - 10.5 K/uL   RBC 4.52  3.87 - 5.11 MIL/uL   Hemoglobin 14.2  12.0 - 15.0 g/dL   HCT 32.4  40.1 - 02.7 %   MCV 95.4  78.0 - 100.0 fL  MCH 31.4  26.0 - 34.0 pg   MCHC 32.9  30.0 - 36.0 g/dL   RDW 96.2  95.2 - 84.1 %   Platelets 193  150 - 400 K/uL   Neutrophils Relative 48  43 - 77 %   Neutro Abs 3.4  1.7 - 7.7 K/uL   Lymphocytes Relative 38  12 - 46 %   Lymphs Abs 2.7  0.7 - 4.0 K/uL   Monocytes Relative 10  3 - 12 %   Monocytes Absolute 0.7  0.1 - 1.0 K/uL   Eosinophils Relative 4  0 - 5 %   Eosinophils Absolute 0.3  0.0 - 0.7 K/uL   Basophils Relative 1  0 - 1 %   Basophils Absolute 0.0  0.0 - 0.1 K/uL  COMPREHENSIVE METABOLIC PANEL     Status: Abnormal   Collection Time    04/05/12  5:32 AM      Result Value Range   Sodium 138  135 - 145 mEq/L   Potassium 4.4  3.5 - 5.1 mEq/L   Chloride 102  96 - 112 mEq/L   CO2 27  19 - 32 mEq/L   Glucose, Bld 109 (*) 70 - 99 mg/dL   BUN 25 (*) 6 - 23 mg/dL   Creatinine, Ser 3.24 (*) 0.50 - 1.10 mg/dL   Calcium 9.6  8.4 - 40.1 mg/dL   Total Protein 6.7  6.0 - 8.3 g/dL   Albumin 3.6  3.5 - 5.2 g/dL   AST 27  0 - 37 U/L   ALT 29  0 - 35 U/L   Alkaline Phosphatase 44  39 - 117 U/L   Total Bilirubin 0.3  0.3 - 1.2 mg/dL   GFR calc non Af Amer 38 (*) >90 mL/min   GFR calc Af Amer 45 (*) >90 mL/min    Comment:            The eGFR has been calculated     using the CKD EPI equation.     This calculation has not been     validated in all clinical     situations.     eGFR's persistently     <90 mL/min signify     possible Chronic Kidney Disease.     HEENT: normal Cardio: RRR Resp: CTA B/L GI: BS positive and Non tender Extremity:  No Edema Skin:   Intact Neuro: Alert/Oriented, Cranial Nerve II-XII normal, Normal Sensory, Normal Motor and Other trunkal ataxia Musc/Skel:  Other no pain with RLE ROM, R knee without ligamentous laxity Gen: NAD   Assessment/Plan: 1. Functional deficits secondary to Right cerebellar thrombotic infarct which require 3+ hours per day of interdisciplinary therapy in a comprehensive inpatient rehab setting. Physiatrist is providing close team supervision and 24 hour management of active medical problems listed below. Physiatrist and rehab team continue to assess barriers to discharge/monitor patient progress toward functional and medical goals. FIM:                   Comprehension Comprehension Mode: Auditory Comprehension: 6-Follows complex conversation/direction: With extra time/assistive device  Expression Expression Mode: Verbal Expression: 6-Expresses complex ideas: With extra time/assistive device  Social Interaction Social Interaction: 6-Interacts appropriately with others with medication or extra time (anti-anxiety, antidepressant).  Problem Solving Problem Solving: 6-Solves complex problems: With extra time  Memory Memory: 6-More than reasonable amt of time   1. Right thrombotic cerebellar infarct  2.. DVT Prophylaxis/Anticoagulation: Subcutaneous Lovenox. Monitor platelet counts and any signs  of bleeding  3. Mood: Zoloft 100 mg daily, Aricept 5 mg each bedtime. Provide emotional support and positive reinforcement  4. Neuropsych: This patient is capable of making decisions on his/her own behalf.  5. Diabetes mellitus with  peripheral neuropathy. Hemoglobin A1c 6.5. Glucophage 500 mg daily. Check CBGs a.c. and at bedtime  6. Hypertension. Lotensin 20 mg daily. Monitor with increased activity  7. Polymyalgia rheumatica. Prednisone 4 mg daily. Monitor for any signs of flare up  8. Hypothyroidism. Synthroid daily  9. Hyperlipidemia. Lipitor  LOS (Days) 1 A FACE TO FACE EVALUATION WAS PERFORMED  Santos Hardwick E 04/05/2012, 8:57 AM

## 2012-04-05 NOTE — Evaluation (Signed)
Occupational Therapy Assessment and Plan  Patient Details  Name: [name] MRN: [id number] Date of Birth: Nov 11, 1934  OT Diagnosis: ataxia, cognitive deficits, hemiplegia affecting dominant side, muscle weakness (generalized) and pain in joint Rehab Potential:   ELOS:   5-7 day  Today's Date: 04/05/2012 Time: 0900-1000 Time Calculation (min): 60 min  Problem List:  Patient Active Problem List  Diagnosis  . Bifascicular block  . Hypertension  . Hyperlipidemia  . Obstructive sleep apnea  . Diabetes mellitus  . Cerebellar stroke, acute  . Ataxia following cerebral infarction  . Nausea and vomiting  . Polymyalgia rheumatica  . CVA (cerebral infarction)    Past Medical History:  Past Medical History  Diagnosis Date  . Hyperlipidemia   . Hypertension   . Obstructive sleep apnea   . Bifascicular block   . Diabetes mellitus   . Thyroid disease   . Hypothyroidism   . Gastric ulcer   . Squamous cell skin cancer   . Shortness of breath   . Stroke 03/31/2012  . GERD (gastroesophageal reflux disease)   . Kidney stones     HX OF  . Arthritis    Past Surgical History:  Past Surgical History  Procedure Laterality Date  . Total knee arthroplasty      Left knee  . Tonsillectomy    . Appendectomy    . Hernia repair    . Rotator cuff repair      RT SHOULDER  . Wrist fracture surgery      Assessment & Plan Clinical Impression: Patient is a 78 y.o. year old female with history of polymyalgia rheumatica, hypertension as well as diabetes mellitus with peripheral neuropathy. Admitted 03/31/2012 with right-sided weakness and dizziness with associated nausea and vomiting. Patient reports that she had not taken her medications in the past 2 days MRI of the brain showed a 3 cm acute infarct affecting the right cerebellum without hemorrhage. MRA of the head with atherosclerotic type disease. Echocardiogram with ejection fraction of 60% and grade 1 diastolic dysfunction. Carotid  Dopplers with no ICA stenosis. Patient placed on aspirin therapy as well as subcutaneous Lovenox for DVT prophylaxis. Hemoglobin A1c is 6.5 and remains on Glucophage. Physical and occupational therapy evaluations completed an ongoing with reported issues of significant balance impairments and mild cognitive impairments with recommendations of physical medicine rehabilitation consult to consider inpatient rehabilitation services. Patient transferred to CIR on 04/04/2012 .    Patient currently requires mod with basic self-care skills secondary to muscle weakness and muscle joint tightness and ataxia and decreased coordination.  Prior to hospitalization, patient could complete Basic ADL, IADL with independent .  Patient will benefit from skilled intervention to increase independence with basic self-care skills and increase level of independence with iADL prior to discharge home with care partner.  Anticipate patient will require intermittent supervision and follow up outpatient.  OT - End of Session Endurance Deficit: No   Skilled Therapeutic Intervention   OT Evaluation Precautions/Restrictions  Precautions Precautions: Fall Restrictions Weight Bearing Restrictions: No General Chart Reviewed: Yes Family/Caregiver Present: Yes (son present initially) Vital Signs Therapy Vitals Pulse Rate: 73 BP: 133/97 mmHg Patient Position, if appropriate: Lying Pain Pain Assessment Pain Assessment: No/denies pain Pain Score:   1 Pain Type: Chronic pain Pain Location: Hip Pain Orientation: Right Pain Descriptors: Aching Pain Onset: With Activity Pain Intervention(s): Repositioned Multiple Pain Sites: No Home Living/Prior Functioning Home Living Lives With: Son Available Help at Discharge: Family Type of Home: House Home Access:  Stairs to enter [employer] of Steps: 5 Entrance Stairs-Rails: Right;Left;Can reach both Home Layout: One level Home Adaptive Equipment: Straight  cane;Walker - rolling IADL History Homemaking Responsibilities: Yes Meal Prep Responsibility: Primary Current License: Yes Mode of Transportation: Car Occupation: Retired Prior Function Level of Independence: Independent with basic ADLs;Independent with homemaking with ambulation;Independent with gait;Independent with transfers Able to Take Stairs?: Yes Driving: Yes ADL   Vision/Perception  Vision - History Baseline Vision: Wears glasses only for reading Patient Visual Report: No change from baseline Vision - Assessment Eye Alignment: Within Functional Limits Perception Perception: Within Functional Limits Praxis Praxis: Intact  Cognition Arousal/Alertness: Awake/alert Orientation Level: Oriented X4 Attention: Selective Selective Attention: Appears intact Sensation Sensation Light Touch: Appears Intact Stereognosis: Not tested Hot/Cold: Not tested Proprioception: Appears Intact Coordination Gross Motor Movements are Fluid and Coordinated: No Coordination and Movement Description: RLE coordination limited by weakness Motor  Motor Motor: Other (comment) Motor - Skilled Clinical Observations: R sided weakness, impaired coordination Mobility  Bed Mobility Bed Mobility: Not assessed Transfers Sit to Stand: 4: Min assist Stand to Sit: 4: Min assist  Trunk/Postural Assessment  Cervical Assessment Cervical Assessment: Exceptions to [facility] (slight forward head) Thoracic Assessment Thoracic Assessment: Exceptions to [facility] (significant thoracic kyphosis) Lumbar Assessment Lumbar Assessment: Exceptions to [facility] (significant lumbar lordosis ) Postural Control Postural Control: Deficits on evaluation (impaired balance strategies, LOB to R and posterior )  Balance Standardized Balance Assessment Standardized Balance Assessment: Berg Balance Test Berg Balance Test Sit to Stand: Able to stand  independently using hands Standing Unsupported: Able to stand safely 2 minutes Sitting  with Back Unsupported but Feet Supported on Floor or Stool: Able to sit safely and securely 2 minutes Stand to Sit: Controls descent by using hands Transfers: Able to transfer safely, minor use of hands Standing Unsupported with Eyes Closed: Able to stand 10 seconds safely Standing Ubsupported with Feet Together: Needs help to attain position but able to stand for 30 seconds with feet together From Standing, Reach Forward with Outstretched Arm: Can reach confidently >25 cm (10") From Standing Position, Pick up Object from Floor: Able to pick up shoe safely and easily From Standing Position, Turn to Look Behind Over each Shoulder: Looks behind from both sides and weight shifts well Turn 360 Degrees: Needs close supervision or verbal cueing Standing Unsupported, Alternately Place Feet on Step/Stool: Able to complete >2 steps/needs minimal assist Standing Unsupported, One Foot in Front: Able to take small step independently and hold 30 seconds Standing on One Leg: Unable to try or needs assist to prevent fall Total Score: 39 Static Standing Balance Static Standing - Balance Support: No upper extremity supported Static Standing - Level of Assistance: 5: Stand by assistance Dynamic Standing Balance Dynamic Standing - Balance Support: No upper extremity supported Dynamic Standing - Level of Assistance: 3: Mod assist Extremity/Trunk Assessment      FIM:  FIM - Grooming Grooming Steps: Wash, rinse, dry face;Wash, rinse, dry hands;Oral care, brush teeth, clean dentures Grooming: 4: Patient completes 3 of 4 or 4 of 5 steps FIM - Bathing Bathing Steps Patient Completed: Chest;Right Arm;Left Arm;Abdomen;Front perineal area;Buttocks;Left upper leg;Right upper leg Bathing: 4: Min-Patient completes 8-9 48f 10 parts or 75+ percent FIM - Upper Body Dressing/Undressing Upper body dressing/undressing steps patient completed: Thread/unthread right sleeve of pullover shirt/dresss;Thread/unthread left sleeve  of pullover shirt/dress;Put head through opening of pull over shirt/dress Upper body dressing/undressing: 4: Min-Patient completed 75 plus % of tasks FIM - Lower Body Dressing/Undressing Lower body dressing/undressing  steps patient completed: Thread/unthread right underwear leg;Pull underwear up/down;Thread/unthread right pants leg;Thread/unthread left pants leg;Pull pants up/down;Don/Doff right shoe;Don/Doff left shoe Lower body dressing/undressing: 3: Mod-Patient completed 50-74% of tasks FIM - Toileting Toileting steps completed by patient: Adjust clothing prior to toileting;Adjust clothing after toileting;Performs perineal hygiene Toileting Assistive Devices: Grab bar or rail for support Toileting: 4: Steadying assist FIM - Games developer Transfer: 4: Chair or W/C > Bed: Min A (steadying Pt. > 75%);4: Bed > Chair or W/C: Min A (steadying Pt. > 75%) FIM - Diplomatic Services operational officer Devices: Grab bars Toilet Transfers: 4-To toilet/BSC: Min A (steadying Pt. > 75%);4-From toilet/BSC: Min A (steadying Pt. > 75%) FIM - Tub/Shower Transfers Tub/Shower Assistive Devices: Tub transfer bench;Grab bars;Walk in shower Tub/shower Transfers: 4-Into Tub/Shower: Min A (steadying Pt. > 75%/lift 1 leg);4-Out of Tub/Shower: Min A (steadying Pt. > 75%/lift 1 leg)   Refer to Care Plan for Long Term Goals  Recommendations for other services: None  Discharge Criteria: Patient will be discharged from OT if patient refuses treatment 3 consecutive times without medical reason, if treatment goals not met, if there is a change in medical status, if patient makes no progress towards goals or if patient is discharged from hospital.  The above assessment, treatment plan, treatment alternatives and goals were discussed and mutually agreed upon: by patient  Collier Salina 04/05/2012, 10:30 AM

## 2012-04-05 NOTE — Progress Notes (Signed)
Patient information reviewed and entered into eRehab system by Lashia Niese, RN, CRRN, PPS Coordinator.  Information including medical coding and functional independence measure will be reviewed and updated through discharge.     Per nursing patient was given "Data Collection Information Summary for Patients in Inpatient Rehabilitation Facilities with attached "Privacy Act Statement-Health Care Records" upon admission.  

## 2012-04-05 NOTE — Care Management Note (Signed)
Inpatient Rehabilitation Center Individual Statement of Services  Patient Name:  [name]  Date:  04/05/2012  Welcome to the Inpatient Rehabilitation Center.  Our goal is to provide you with an individualized program based on your diagnosis and situation, designed to meet your specific needs.  With this comprehensive rehabilitation program, you will be expected to participate in at least 3 hours of rehabilitation therapies Monday-Friday, with modified therapy programming on the weekends.  Your rehabilitation program will include the following services:  Physical Therapy (PT), Occupational Therapy (OT), Speech Therapy (ST), 24 hour per day rehabilitation nursing, Case Management ( Social Worker), Rehabilitation Medicine, Nutrition Services and Pharmacy Services  Weekly team conferences will be held on Wednesday to discuss your progress.  Your  Social Worker will talk with you frequently to get your input and to update you on team discussions.  Team conferences with you and your family in attendance may also be held.  Expected length of stay: 3-5 days Overall anticipated outcome: supervision level  Depending on your progress and recovery, your program may change.  Your  Social Worker will coordinate services and will keep you informed of any changes.  Your  and SW name and contact number are listed  below.  The following services may also be recommended but are not provided by the Inpatient Rehabilitation Center:   Driving Evaluations  Home Health Rehabiltiation Services  Outpatient Rehabilitatation Servives   Arrangements will be made to provide these services after discharge if needed.  Arrangements include referral to agencies that provide these services.  Your insurance has been verified to be:  Medicare & [employer] Your primary doctor is:  Dr Creola Corn  Pertinent information will be shared with your doctor and your insurance company.   Social Worker:  Dossie Der, [place]  [phone]  Information discussed with and copy given to patient by: [name], 04/05/2012, 9:02 AM

## 2012-04-05 NOTE — Evaluation (Signed)
Physical Therapy Assessment and Plan  Patient Details  Name: [name] MRN: [id number] Date of Birth: 11-30-1934  PT Diagnosis: Abnormal posture, Abnormality of gait, Coordination disorder, Difficulty walking, Muscle weakness and Pain in RLE Rehab Potential: Excellent ELOS: 7 days   Today's Date: 04/05/2012 Time: 0800-0900 Time Calculation (min): 60 min  Problem List:  Patient Active Problem List  Diagnosis  . Bifascicular block  . Hypertension  . Hyperlipidemia  . Obstructive sleep apnea  . Diabetes mellitus  . Cerebellar stroke, acute  . Ataxia following cerebral infarction  . Nausea and vomiting  . Polymyalgia rheumatica  . CVA (cerebral infarction)    Past Medical History:  Past Medical History  Diagnosis Date  . Hyperlipidemia   . Hypertension   . Obstructive sleep apnea   . Bifascicular block   . Diabetes mellitus   . Thyroid disease   . Hypothyroidism   . Gastric ulcer   . Squamous cell skin cancer   . Shortness of breath   . Stroke 03/31/2012  . GERD (gastroesophageal reflux disease)   . Kidney stones     HX OF  . Arthritis    Past Surgical History:  Past Surgical History  Procedure Laterality Date  . Total knee arthroplasty      Left knee  . Tonsillectomy    . Appendectomy    . Hernia repair    . Rotator cuff repair      RT SHOULDER  . Wrist fracture surgery      Assessment & Plan Clinical Impression: Patient is a 78 y.o. right-handed female with history of polymyalgia rheumatica, hypertension as well as diabetes mellitus with peripheral neuropathy. Admitted 03/31/2012 with right-sided weakness and dizziness with associated nausea and vomiting. Patient reports that she had not taken her medications in the past 2 days because she just didn't want to take them. MRI of the brain showed a 3 cm acute infarct affecting the right cerebellum without hemorrhage. MRA of the head with atherosclerotic type disease. Echocardiogram with ejection fraction of  60% and grade 1 diastolic dysfunction. Carotid Dopplers with no ICA stenosis. Patient placed on aspirin therapy as well as subcutaneous Lovenox for DVT prophylaxis. Hemoglobin A1c is 6.5 and remains on Glucophage.  Patient transferred to CIR on 04/04/2012 .   Patient currently requires min with mobility secondary to muscle weakness, decreased coordination and decreased standing balance, decreased postural control and decreased balance strategies.  Prior to hospitalization, patient was independent  with mobility and lived with Son in a House home.  Home access is 5Stairs to enter with bilat rails.  Patient will benefit from skilled PT intervention to maximize safe functional mobility, minimize fall risk and decrease caregiver burden for planned discharge home with intermittent assist.  Anticipate patient will benefit from follow up OP at discharge.  PT - End of Session Activity Tolerance: Tolerates 30+ min activity with multiple rests Endurance Deficit: No PT Assessment Rehab Potential: Excellent Barriers to Discharge: None PT Plan PT Intensity: Minimum of 1-2 x/day ,45 to 90 minutes PT Frequency: 5 out of 7 days PT Duration Estimated Length of Stay: 7 days PT Treatment/Interventions: Ambulation/gait training;Balance/vestibular training;Community reintegration;Discharge planning;DME/adaptive equipment instruction;Functional mobility training;Neuromuscular re-education;Patient/family education;Pain management;Stair training;Therapeutic Activities;Therapeutic Exercise;UE/LE Strength taining/ROM;UE/LE Coordination activities PT Recommendation Follow Up Recommendations: Outpatient PT Patient destination: Home Equipment Recommended: None recommended by PT  Skilled Therapeutic Intervention Following BERG balance assessment and gait velocity assessment patient's falls risk discussed with patient.  Patient currently at high risk for falls  and strongly encouraged to call for staff assistance when moving  around in the room.  Discussed goals, LOS, f/u therapy recommendations with patient and with son present.    PT Evaluation Precautions/Restrictions Precautions Precautions: Fall General Chart Reviewed: Yes Family/Caregiver Present: Yes Vital SignsTherapy Vitals Temp: 97.5 F (36.4 C) Temp src: Oral Pulse Rate: 73 Resp: 19 BP: 133/97 mmHg Patient Position, if appropriate: Lying Oxygen Therapy SpO2: 94 % O2 Device: None (Room air) Pain Pain Assessment Pain Assessment: No/denies pain Pain Score:   1 Pain Type: Chronic pain Pain Location: Leg Pain Orientation: Right Pain Descriptors: Sore;Nagging Pain Onset: Unable to tell Pain Intervention(s): Refused;MD notified (Comment) (patient reported pain to MD) Home Living/Prior Functioning Home Living Lives With: Son Available Help at Discharge: Family Type of Home: House Home Access: Stairs to enter Secretary/administrator of Steps: 5 Entrance Stairs-Rails: Right;Left;Can reach both Home Layout: One level Home Adaptive Equipment: Straight cane;Walker - rolling Prior Function Level of Independence: Independent with basic ADLs;Independent with homemaking with ambulation;Independent with gait;Independent with transfers Able to Take Stairs?: Yes Driving: Yes Vision/Perception  Vision - History Patient Visual Report: No change from baseline Perception Perception: Within Functional Limits Praxis Praxis: Intact  Cognition Arousal/Alertness: Awake/alert Orientation Level: Oriented X4 Sensation Sensation Light Touch: Appears Intact Stereognosis: Not tested Hot/Cold: Not tested Proprioception: Appears Intact Coordination Gross Motor Movements are Fluid and Coordinated: No Coordination and Movement Description: RLE coordination limited by weakness Motor  Motor Motor: Other (comment) Motor - Skilled Clinical Observations: R sided weakness, impaired coordination  Mobility Bed Mobility Bed Mobility: Not  assessed Transfers Sit to Stand: 4: Min assist Stand to Sit: 4: Min assist Stand Pivot Transfers: 4: Min assist Stand Pivot Transfer Details (indicate cue type and reason): Stand pivot with min A for stabilization once standing with intermittent R lateral and posterior LOB Locomotion  Ambulation Ambulation: Yes Ambulation/Gait Assistance: 4: Min assist Ambulation Distance (Feet): 100 Feet Assistive device: 1 person hand held assist Ambulation/Gait Assistance Details: Performed gait in controlled environment with min A secondary to intermittent LOB during head turns and changes in direction Gait Gait: Yes Gait Pattern: Impaired Gait Pattern: Step-through pattern;Decreased stance time - right;Decreased weight shift to right;Lateral trunk lean to right;Lateral trunk lean to left;Wide base of support;Decreased trunk rotation Gait velocity: 1.56 ft/sec with <1.8 indicating risk for recurrent falls Stairs / Additional Locomotion Stairs: Yes Stairs Assistance: 4: Min assist Stairs Assistance Details (indicate cue type and reason): Patient performed stairs with step to pattern with increased difficulty advancing COG forward when asceding and pushing up with RLE  Stair Management Technique: Two rails;Step to pattern;Forwards Number of Stairs: 5 Height of Stairs: 6 Wheelchair Mobility Wheelchair Mobility: No  Trunk/Postural Assessment  Cervical Assessment Cervical Assessment: Exceptions to [facility] (slight forward head) Thoracic Assessment Thoracic Assessment: Exceptions to [facility] (significant thoracic kyphosis) Lumbar Assessment Lumbar Assessment: Exceptions to [facility] (significant lumbar lordosis ) Postural Control Postural Control: Deficits on evaluation (impaired balance strategies, LOB to R and posterior )  Balance Standardized Balance Assessment Standardized Balance Assessment: Berg Balance Test Berg Balance Test Sit to Stand: Able to stand  independently using hands Standing Unsupported:  Able to stand safely 2 minutes Sitting with Back Unsupported but Feet Supported on Floor or Stool: Able to sit safely and securely 2 minutes Stand to Sit: Controls descent by using hands Transfers: Able to transfer safely, minor use of hands Standing Unsupported with Eyes Closed: Able to stand 10 seconds safely Standing Ubsupported with Feet Together: Needs help to  attain position but able to stand for 30 seconds with feet together From Standing, Reach Forward with Outstretched Arm: Can reach confidently >25 cm (10") From Standing Position, Pick up Object from Floor: Able to pick up shoe safely and easily From Standing Position, Turn to Look Behind Over each Shoulder: Looks behind from both sides and weight shifts well Turn 360 Degrees: Needs close supervision or verbal cueing Standing Unsupported, Alternately Place Feet on Step/Stool: Able to complete >2 steps/needs minimal assist Standing Unsupported, One Foot in Front: Able to take small step independently and hold 30 seconds Standing on One Leg: Unable to try or needs assist to prevent fall Total Score: 39 Patient demonstrates increased fall risk as noted by score of 39/56 on Berg Balance Scale.  (<36= high risk for falls, close to 100%; 37-45 significant >80%; 46-51 moderate >50%; 52-55 lower >25%)  Static Standing Balance Static Standing - Balance Support: No upper extremity supported Static Standing - Level of Assistance: 5: Stand by assistance Dynamic Standing Balance Dynamic Standing - Balance Support: No upper extremity supported Dynamic Standing - Level of Assistance: 3: Mod assist Extremity Assessment  RLE Assessment RLE Assessment: Exceptions to [facility] RLE Strength RLE Overall Strength: Deficits RLE Overall Strength Comments: 3/5 hip flexion, 4-/5 knee flexion/extension and ankle DF LLE Assessment LLE Assessment: Exceptions to [facility] LLE Strength LLE Overall Strength: Deficits;Due to premorbid status LLE Overall Strength  Comments: 5/5 overall except 3/5 hip flexion  FIM:  FIM - Bed/Chair Transfer Bed/Chair Transfer: 4: Bed > Chair or W/C: Min A (steadying Pt. > 75%);4: Chair or W/C > Bed: Min A (steadying Pt. > 75%) FIM - Locomotion: Wheelchair Locomotion: Wheelchair: 0: Activity did not occur FIM - Locomotion: Ambulation Locomotion: Ambulation Assistive Devices: Other (comment) (HHA) Ambulation/Gait Assistance: 4: Min assist Locomotion: Ambulation: 2: Travels 50 - 149 ft with minimal assistance (Pt.>75%) FIM - Locomotion: Stairs Locomotion: Building control surveyor: Hand rail - 2 Locomotion: Stairs: 2: Up and Down 4 - 11 stairs with minimal assistance (Pt.>75%)   Refer to Care Plan for Long Term Goals  Recommendations for other services: None  Discharge Criteria: Patient will be discharged from PT if patient refuses treatment 3 consecutive times without medical reason, if treatment goals not met, if there is a change in medical status, if patient makes no progress towards goals or if patient is discharged from hospital.  The above assessment, treatment plan, treatment alternatives and goals were discussed and mutually agreed upon: by patient and by family  Lynwood Desanctis 04/05/2012, 9:17 AM

## 2012-04-05 NOTE — Patient Care Conference (Signed)
Inpatient RehabilitationTeam Conference and Plan of Care Update Date: 04/05/2012   Time: 10:35 AM    Patient Name: [name]      Medical Record Number: [id number]  Date of Birth: 1934/06/10 Sex: Female         Room/Bed: 4037/4037-01 Payor Info: Payor: MEDICARE  Plan: MEDICARE PART A AND B  Product Type: *No Product type*     Admitting Diagnosis: N  Admit Date/Time:  04/04/2012  5:51 PM Admission Comments: No comment available   Primary Diagnosis:  CVA (cerebral infarction) Principal Problem: CVA (cerebral infarction)  Patient Active Problem List   Diagnosis Date Noted  . CVA (cerebral infarction) 04/04/2012  . Cerebellar stroke, acute 04/01/2012  . 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 Nausea and vomiting 04/01/2012  . Polymyalgia rheumatica 04/01/2012  . Bifascicular block   . Hypertension   . Hyperlipidemia   . Obstructive sleep apnea   . Diabetes mellitus     Expected Discharge Date: Expected Discharge Date: 04/08/12  Team Members Present: Physician leading conference: Dr. Claudette Laws Social Worker Present: Dossie Der, LCSW Nurse Present: Gregor Hams, RN PT Present: Edman Circle, PT;Caroline Adriana Simas, PT;Other (comment) Clarisse Gouge Ripa-PT) OT Present: Leonette Monarch, Felipa Eth, OT SLP Present: [name], SLP Other (Discipline and Name): Charolette Child Coordinator     Current Status/Progress Goal Weekly Team Focus  Medical   Cerebellar infarct causing truncal ataxia, chronic right lower extremity pain  Assessed lower extremity pain with appropriate treatment  Rehabilitation evaluations   Bowel/Bladder   Continent of bowel and bladder. LBM 04/03/2012  Remain continent of bowel and bladder.  Monitor.   Swallow/Nutrition/ Hydration   eval pending         ADL's   mod assist  supervision  balance and coordination, minimal use of durable medical equipment to challenge patient   Mobility   Min A overall, high falls risk, BERG 39/56   Mod I overall  higher level balance, gait, coordination   Communication   eval pending         Safety/Cognition/ Behavioral Observations  eval pending         Pain   Denies any pain  Pain level 3 or less on a scale of 0-10  Assess and monitor any onset of pain.   Skin   Mild Bruising to abdomen  No new skin breakdown or infection.  Assess skin q shift      *See Interdisciplinary Assessment and Plan and progress notes for long and short-term goals  Barriers to Discharge: Still requiring physical assistance    Possible Resolutions to Barriers:  Of great functional status to modified independent or supervision level    Discharge Planning/Teaching Needs:    Son lives with currently unemployed- supportive daughter.  Important to pt to go to funeral on Sat close with sister in-law     Team Discussion:  Minimal deficits-making good progress.  Wants to go to sister in-law's funeral on Sat.  Son can provide supervision level-currently unemployed.  Need OP therapies at discharge  Revisions to Treatment Plan:  New Eval   Continued Need for Acute Rehabilitation Level of Care: The patient requires daily medical management by a physician with specialized training in physical medicine and rehabilitation for the following conditions: Daily direction of a multidisciplinary physical rehabilitation program to ensure safe treatment while eliciting the highest outcome that is of practical value to the patient.: Yes Daily medical management of patient stability for increased activity during participation in an  intensive rehabilitation regime.: Yes Daily analysis of laboratory values and/or radiology reports with any subsequent need for medication adjustment of medical intervention for : Neurological problems  Saleen Peden, Lemar Livings 04/07/2012, 9:18 AM

## 2012-04-05 NOTE — Progress Notes (Signed)
Social Work Patient ID: [name], female   DOB: 01-30-1935, 78 y.o.   MRN: [id number]  Met with pt to inform team conference goals-supervision level due to earlier discharge because of sister in-law's funeral on Sat. She is very appreciatative of this.  Son can be there with her for a short time and transport to [place]. She was close with her sister in-law and Her cancer diagnosis was just in Dec 2013.  Provided support and offered support services to pt, she is coping appropriately with all she is enduring  At this time.  Continue to work toward discharge Sat.

## 2012-04-05 NOTE — Evaluation (Signed)
Speech Language Pathology Assessment and Plan  Patient Details  Name: [name] MRN: [id number] Date of Birth: December 10, 1934  SLP Diagnosis: Aphasia  Rehab Potential: Excellent ELOS: 7 days   Today's Date: 04/05/2012 Time: 1330-1430 Time Calculation (min): 60 min  Problem List:  Patient Active Problem List  Diagnosis  . Bifascicular block  . Hypertension  . Hyperlipidemia  . Obstructive sleep apnea  . Diabetes mellitus  . Cerebellar stroke, acute  . Ataxia following cerebral infarction  . Nausea and vomiting  . Polymyalgia rheumatica  . CVA (cerebral infarction)   Past Medical History:  Past Medical History  Diagnosis Date  . Hyperlipidemia   . Hypertension   . Obstructive sleep apnea   . Bifascicular block   . Diabetes mellitus   . Thyroid disease   . Hypothyroidism   . Gastric ulcer   . Squamous cell skin cancer   . Shortness of breath   . Stroke 03/31/2012  . GERD (gastroesophageal reflux disease)   . Kidney stones     HX OF  . Arthritis    Past Surgical History:  Past Surgical History  Procedure Laterality Date  . Total knee arthroplasty      Left knee  . Tonsillectomy    . Appendectomy    . Hernia repair    . Rotator cuff repair      RT SHOULDER  . Wrist fracture surgery      Assessment / Plan / Recommendation Clinical Impression  [name] is a 78 y.o. right-handed female with history of polymyalgia rheumatica, hypertension as well as diabetes mellitus with peripheral neuropathy. Admitted 03/31/2012 with right-sided weakness and dizziness with associated nausea and vomiting. Patient reports that she had not taken her medications in the past 2 days because she just didn't want to take them. MRI of the brain showed a 3 cm acute infarct affecting the right cerebellum without hemorrhage. MRA of the head with atherosclerotic type disease. Echocardiogram with ejection fraction of 60% and grade 1 diastolic dysfunction. Carotid Dopplers with no ICA  stenosis. Patient placed on aspirin therapy as well as subcutaneous Lovenox for DVT prophylaxis. Hemoglobin A1c is 6.5 and remains on Glucophage. Physical and occupational therapy evaluations completed and ongoing with significant balance impairments and mild cognitive impairments with recommendations for physical medicine rehabilitation consult to consider inpatient rehabilitation services. Patient was felt to be a candidate for inpatient rehabilitation services and was admitted on 04/04/12 for a comprehensive rehabilitation program. Cognitive-linguistic evaluation revealed anomia at the sentence and conversational speech level as well as premorbid recall deficits.  SLP recommends skilled treatment to address these deficits by educating regarding use of compensatory strategies and promoting carryover to facilitate independence upon discharge.  Treatment initiated and focused on use of word fining strategies during a structured description task with min assist cues.      SLP Assessment  Patient will need skilled Speech Lanaguage Pathology Services during CIR admission    Recommendations  Patient destination: Home Follow up Recommendations: None Equipment Recommended: None recommended by SLP    SLP Frequency 5 out of 7 days   SLP Treatment/Interventions Cueing hierarchy;Environmental controls;Functional tasks;Internal/external aids;Patient/family education;Speech/Language facilitation;Therapeutic Activities    Pain Pain Assessment Pain Assessment: No/denies pain Prior Functioning Cognitive/Linguistic Baseline: Within functional limits  Short Term Goals: Week 1: SLP Short Term Goal 1 (Week 1): Short term goals = Long term goals  See FIM for current functional status Refer to Care Plan for Long Term Goals  Recommendations for  other services: None  Discharge Criteria: Patient will be discharged from SLP if patient refuses treatment 3 consecutive times without medical reason, if treatment goals  not met, if there is a change in medical status, if patient makes no progress towards goals or if patient is discharged from hospital.  The above assessment, treatment plan, treatment alternatives and goals were discussed and mutually agreed upon: by patient  [name], M.A., CCC-SLP [phone]  [name] 04/05/2012, 3:02 PM

## 2012-04-05 NOTE — Progress Notes (Signed)
Reviewed and agree with the attached treatment note.  Lovell Roe, OTR/L 

## 2012-04-06 ENCOUNTER — Inpatient Hospital Stay (HOSPITAL_COMMUNITY): Payer: Medicare Other | Admitting: Occupational Therapy

## 2012-04-06 ENCOUNTER — Inpatient Hospital Stay (HOSPITAL_COMMUNITY): Payer: Medicare Other | Admitting: Physical Therapy

## 2012-04-06 ENCOUNTER — Inpatient Hospital Stay (HOSPITAL_COMMUNITY): Payer: Medicare Other | Admitting: Speech Pathology

## 2012-04-06 LAB — GLUCOSE, CAPILLARY
Glucose-Capillary: 92 mg/dL (ref 70–99)
Glucose-Capillary: 105 mg/dL — ABNORMAL HIGH (ref 70–99)

## 2012-04-06 MED ORDER — GLIMEPIRIDE 2 MG PO TABS
2.0000 mg | ORAL_TABLET | Freq: Every day | ORAL | Status: DC
  Administered 2012-04-07 – 2012-04-10 (×4): 2 mg via ORAL
  Filled 2012-04-06 (×5): qty 1

## 2012-04-06 NOTE — Progress Notes (Signed)
Patient ID: [name], female   DOB: 11/01/1934, 78 y.o.   MRN: [id number]  Subjective/Complaints: 78 y.o. right-handed female with history of polymyalgia rheumatica, hypertension as well as diabetes mellitus with peripheral neuropathy. Admitted 03/31/2012 with right-sided weakness and dizziness with associated nausea and vomiting. Patient reports that she had not taken her medications in the past 2 days because she just didn't want to take them. MRI of the brain showed a 3 cm acute infarct affecting the right cerebellum without hemorrhage. MRA of the head with atherosclerotic type disease. Echocardiogram with ejection fraction of 60% and grade 1 diastolic dysfunction. Carotid Dopplers with no ICA stenosis. Patient placed on aspirin therapy as well as subcutaneous Lovenox for DVT prophylaxis No complaints ,tolerating PT Son would like pt to attend a funeral on Sat  Review of Systems  Respiratory: Positive for shortness of breath.        With ambulation  Neurological: Positive for dizziness and focal weakness.       Right Lower extremity chronic weakness  All other systems reviewed and are negative.     Objective: Vital Signs: Blood pressure 110/71, pulse 66, temperature 97.7 F (36.5 C), temperature source Oral, resp. rate 18, height 5\' 3"  (1.6 m), weight 95.7 kg (210 lb 15.7 oz), SpO2 94.00%. No results found. Results for orders placed during the hospital encounter of 04/04/12 (from the past 72 hour(s))  CBC     Status: None   Collection Time    04/04/12  6:34 PM      Result Value Range   WBC 8.3  4.0 - 10.5 K/uL   RBC 4.46  3.87 - 5.11 MIL/uL   Hemoglobin 13.8  12.0 - 15.0 g/dL   HCT 40.9  81.1 - 91.4 %   MCV 93.9  78.0 - 100.0 fL   MCH 30.9  26.0 - 34.0 pg   MCHC 32.9  30.0 - 36.0 g/dL   RDW 78.2  95.6 - 21.3 %   Platelets 187  150 - 400 K/uL  CREATININE, SERUM     Status: Abnormal   Collection Time    04/04/12  6:34 PM      Result Value Range   Creatinine, Ser 1.63 (*)  0.50 - 1.10 mg/dL   GFR calc non Af Amer 30 (*) >90 mL/min   GFR calc Af Amer 34 (*) >90 mL/min   Comment:            The eGFR has been calculated     using the CKD EPI equation.     This calculation has not been     validated in all clinical     situations.     eGFR's persistently     <90 mL/min signify     possible Chronic Kidney Disease.  GLUCOSE, CAPILLARY     Status: Abnormal   Collection Time    04/04/12  8:43 PM      Result Value Range   Glucose-Capillary 110 (*) 70 - 99 mg/dL   Comment 1 Notify RN    CBC WITH DIFFERENTIAL     Status: None   Collection Time    04/05/12  5:32 AM      Result Value Range   WBC 7.2  4.0 - 10.5 K/uL   RBC 4.52  3.87 - 5.11 MIL/uL   Hemoglobin 14.2  12.0 - 15.0 g/dL   HCT 08.6  57.8 - 46.9 %   MCV 95.4  78.0 - 100.0 fL  MCH 31.4  26.0 - 34.0 pg   MCHC 32.9  30.0 - 36.0 g/dL   RDW 04.5  40.9 - 81.1 %   Platelets 193  150 - 400 K/uL   Neutrophils Relative 48  43 - 77 %   Neutro Abs 3.4  1.7 - 7.7 K/uL   Lymphocytes Relative 38  12 - 46 %   Lymphs Abs 2.7  0.7 - 4.0 K/uL   Monocytes Relative 10  3 - 12 %   Monocytes Absolute 0.7  0.1 - 1.0 K/uL   Eosinophils Relative 4  0 - 5 %   Eosinophils Absolute 0.3  0.0 - 0.7 K/uL   Basophils Relative 1  0 - 1 %   Basophils Absolute 0.0  0.0 - 0.1 K/uL  COMPREHENSIVE METABOLIC PANEL     Status: Abnormal   Collection Time    04/05/12  5:32 AM      Result Value Range   Sodium 138  135 - 145 mEq/L   Potassium 4.4  3.5 - 5.1 mEq/L   Chloride 102  96 - 112 mEq/L   CO2 27  19 - 32 mEq/L   Glucose, Bld 109 (*) 70 - 99 mg/dL   BUN 25 (*) 6 - 23 mg/dL   Creatinine, Ser 9.14 (*) 0.50 - 1.10 mg/dL   Calcium 9.6  8.4 - 78.2 mg/dL   Total Protein 6.7  6.0 - 8.3 g/dL   Albumin 3.6  3.5 - 5.2 g/dL   AST 27  0 - 37 U/L   ALT 29  0 - 35 U/L   Alkaline Phosphatase 44  39 - 117 U/L   Total Bilirubin 0.3  0.3 - 1.2 mg/dL   GFR calc non Af Amer 38 (*) >90 mL/min   GFR calc Af Amer 45 (*) >90 mL/min    Comment:            The eGFR has been calculated     using the CKD EPI equation.     This calculation has not been     validated in all clinical     situations.     eGFR's persistently     <90 mL/min signify     possible Chronic Kidney Disease.  GLUCOSE, CAPILLARY     Status: None   Collection Time    04/05/12  7:25 AM      Result Value Range   Glucose-Capillary 96  70 - 99 mg/dL   Comment 1 Notify RN    GLUCOSE, CAPILLARY     Status: Abnormal   Collection Time    04/05/12 10:47 AM      Result Value Range   Glucose-Capillary 115 (*) 70 - 99 mg/dL  GLUCOSE, CAPILLARY     Status: None   Collection Time    04/05/12  4:25 PM      Result Value Range   Glucose-Capillary 83  70 - 99 mg/dL   Comment 1 Notify RN    GLUCOSE, CAPILLARY     Status: Abnormal   Collection Time    04/05/12  9:17 PM      Result Value Range   Glucose-Capillary 114 (*) 70 - 99 mg/dL   Comment 1 Notify RN    GLUCOSE, CAPILLARY     Status: None   Collection Time    04/06/12  7:21 AM      Result Value Range   Glucose-Capillary 92  70 - 99 mg/dL   Comment 1 Notify  RN       HEENT: normal Cardio: RRR Resp: CTA B/L GI: BS positive and Non tender Extremity:  No Edema Skin:   Intact Neuro: Alert/Oriented, Cranial Nerve II-XII normal, Normal Sensory, Normal Motor and Other trunkal ataxia Musc/Skel:  Other no pain with RLE ROM, R knee without ligamentous laxity Gen: NAD   Assessment/Plan: 1. Functional deficits secondary to Right cerebellar thrombotic infarct which require 3+ hours per day of interdisciplinary therapy in a comprehensive inpatient rehab setting. Physiatrist is providing close team supervision and 24 hour management of active medical problems listed below. Physiatrist and rehab team continue to assess barriers to discharge/monitor patient progress toward functional and medical goals. FIM: FIM - Bathing Bathing Steps Patient Completed: Chest;Right Arm;Left Arm;Abdomen;Front perineal  area;Buttocks;Left upper leg;Right upper leg Bathing: 4: Min-Patient completes 8-9 34f 10 parts or 75+ percent  FIM - Upper Body Dressing/Undressing Upper body dressing/undressing steps patient completed: Thread/unthread right sleeve of pullover shirt/dresss;Thread/unthread left sleeve of pullover shirt/dress;Put head through opening of pull over shirt/dress Upper body dressing/undressing: 4: Min-Patient completed 75 plus % of tasks FIM - Lower Body Dressing/Undressing Lower body dressing/undressing steps patient completed: Thread/unthread right underwear leg;Pull underwear up/down;Thread/unthread right pants leg;Thread/unthread left pants leg;Pull pants up/down;Don/Doff right shoe;Don/Doff left shoe Lower body dressing/undressing: 3: Mod-Patient completed 50-74% of tasks  FIM - Toileting Toileting steps completed by patient: Adjust clothing prior to toileting;Performs perineal hygiene;Adjust clothing after toileting Toileting Assistive Devices: Grab bar or rail for support Toileting: 4: Steadying assist  FIM - Diplomatic Services operational officer Devices: Grab bars Toilet Transfers: 4-To toilet/BSC: Min A (steadying Pt. > 75%);4-From toilet/BSC: Min A (steadying Pt. > 75%)  FIM - Bed/Chair Transfer Bed/Chair Transfer: 4: Chair or W/C > Bed: Min A (steadying Pt. > 75%);4: Bed > Chair or W/C: Min A (steadying Pt. > 75%)  FIM - Locomotion: Wheelchair Locomotion: Wheelchair: 0: Activity did not occur FIM - Locomotion: Ambulation Locomotion: Ambulation Assistive Devices: Other (comment) (HHA) Ambulation/Gait Assistance: 4: Min assist Locomotion: Ambulation: 2: Travels 50 - 149 ft with minimal assistance (Pt.>75%)  Comprehension Comprehension Mode: Auditory Comprehension: 5-Follows basic conversation/direction: With no assist  Expression Expression Mode: Verbal Expression: 4-Expresses basic 75 - 89% of the time/requires cueing 10 - 24% of the time. Needs helper to occlude  trach/needs to repeat words.  Social Interaction Social Interaction: 4-Interacts appropriately 75 - 89% of the time - Needs redirection for appropriate language or to initiate interaction.  Problem Solving Problem Solving: 5-Solves basic 90% of the time/requires cueing < 10% of the time  Memory Memory: 6-More than reasonable amt of time   1. Right thrombotic cerebellar infarct  2.. DVT Prophylaxis/Anticoagulation: Subcutaneous Lovenox. Monitor platelet counts and any signs of bleeding  3. Mood: Zoloft 100 mg daily, Aricept 5 mg each bedtime. Provide emotional support and positive reinforcement  4. Neuropsych: This patient is capable of making decisions on his/her own behalf.  5. Diabetes mellitus with peripheral neuropathy. Hemoglobin A1c 6.5. D/C Glucophage 500 mg daily. Check CBGs a.c. and at bedtime  6. Hypertension.D/C Lotensin 20 mg daily. Monitor with increased activity  7. Polymyalgia rheumatica. Prednisone 4 mg daily. Monitor for any signs of flare up  8. Hypothyroidism. Synthroid daily  9. Hyperlipidemia. Lipitor 10.  Mild renal failure may be related to Glucophage and Lotensin will D/C monitor BMET, BP, CBG LOS (Days) 2 A FACE TO FACE EVALUATION WAS PERFORMED  Caliya Narine E 04/06/2012, 9:31 AM

## 2012-04-06 NOTE — Progress Notes (Signed)
Reviewed and agree with the attached treatment note.  Izora Benn, OTR/L 

## 2012-04-06 NOTE — Discharge Summary (Signed)
  Discharge summary job # [id number]

## 2012-04-06 NOTE — Progress Notes (Signed)
Physical Therapy Session Note  Patient Details  Name: [name] MRN: [id number] Date of Birth: 1934/10/22  Today's Date: 04/06/2012 Time: 1000-1055 Time Calculation (min): 55 min  Short Term Goals: Week 1:  PT Short Term Goal 1 (Week 1): = LTG secondary to LOS  Skilled Therapeutic Interventions/Progress Updates:   Patient resting in bed; performed bed mobility to EOB with supervision; patient reporting difficulty reaching to floor to don and tie shoes.  Discussed various options for donning shoes more independently.  Patient able to cross LE over thigh to don shoe but unable to maintain to tie shoes.  Patient able to bend down to foot but becomes SOB and fatigued.  Placed foot up on chair and patient able to lean forward and tie shoe efficiently.  Performed gait x 150' room <> gym first repetition with SPC and min A with verbal cues and hand over hand assistance for gait sequence with [facility] (patient requesting to hold in R hand, educated patient on widening BOS by placing SPC in LUE but with increased difficulty with coordination/sequencing of SPC and step through sequence).  Second repetition patient performed gait with supervision without use of SPC but with verbal cues for increased step/stride length and gait velocity; patient easily distracted by environment and experiences minor LOB during head turns and changes in direction.  Performed dynamic balance training with L foot up on balance foam for R semi single limb stance x 2 reps during letter ball toss and word finding/naming activity with supervision and intermittent min A for posterior LOB.  Continued dynamic standing balance, weight shifting training with R and L lateral stepping x 50' and retro stepping with min A and tactile cues for full weight shifting of COG.  Required frequent rest breaks secondary to fatigue and SOB.    Therapy Documentation Precautions:  Precautions Precautions: Fall Restrictions Weight Bearing Restrictions:  No Vital Signs: Therapy Vitals Pulse Rate: 97 Pain: Pain Assessment Pain Assessment: No/denies pain Pain Score: 0-No pain Locomotion : Ambulation Ambulation/Gait Assistance: 5: Supervision;4: Min guard   See FIM for current functional status  Therapy/Group: Individual Therapy  Edman Circle [facility] 04/06/2012, 10:57 AM

## 2012-04-06 NOTE — Discharge Summary (Signed)
NAMETAKEELA, PEIL NO.:  [id number]  MEDICAL RECORD NO.:  [id number]  LOCATION:  4037                         FACILITY:  [facility]  PHYSICIAN:  Erick Colace, M.D.DATE OF BIRTH:  Jul 01, 1934  DATE OF ADMISSION:  04/04/2012 DATE OF DISCHARGE:  04/08/2012                              DISCHARGE SUMMARY   DISCHARGE DIAGNOSES: 1. Right thrombotic cerebellar infarction. 2. Sequential compression devices for DVT prophylaxis. 3. Depression. 4. Diabetes mellitus with peripheral neuropathy. 5. Hypertension. 6. Polymyalgia rheumatica. 7. Hypothyroidism. 8. Hyperlipidemia. 9. Mild renal insufficiency HISTORY OF PRESENT ILLNESS:  This is a 78 year old right-handed female with history of polymyalgia rheumatica, diabetes mellitus with peripheral neuropathy who was admitted March 31, 2012 with right-sided weakness, dizziness with associated nausea, vomiting.  The patient reports she had not taken her medications for 2 days because she just did not want to take them.  MRI of the brain showed a 3 cm acute infarct affecting the right cerebellum without hemorrhage.  MRA of the head with atherosclerotic type disease.  Echocardiogram with ejection fraction of 60% and grade 1 diastolic dysfunction.  Carotid Dopplers with no ICA stenosis.  The patient placed on aspirin therapy as well as subcutaneous Lovenox for DVT prophylaxis.  Hemoglobin A1c of 6.5 and remained on Glucophage.  Physical and occupational therapy ongoing.  The patient was admitted for a comprehensive rehab program.  PAST MEDICAL HISTORY:  See discharge diagnoses.  SOCIAL HISTORY:  Lives with son and assistance as needed.  FUNCTIONAL HISTORY PRIOR TO ADMISSION:  Independent, retired.  FUNCTIONAL STATUS UPON ADMISSION TO REHAB SERVICES:  Minimal assist to ambulate 200 feet with a rolling walker.  PHYSICAL EXAMINATION:  VITAL SIGNS:  Blood pressure 146/72, pulse 75, temperature 97.9, respirations  20. GENERAL:  This was an alert female, oriented x3. HEENT:  Pupils round and reactive to light.  Decreased fine motor skills on the right side with mild ataxia. LUNGS:  Clear to auscultation. CARDIAC:  Rate controlled. ABDOMEN:  Soft, nontender.  Good bowel sounds.  REHABILITATION HOSPITAL COURSE:  The patient was admitted to Inpatient Rehab Services with therapies initiated on a 3-hour daily basis consisting of physical therapy, occupational therapy, and rehabilitation nursing.  The following issues were addressed during the patient's rehabilitation stay.  Pertaining to Ms. Gilani's right thrombotic cerebellar infarction remain stable, maintained on aspirin therapy. Subcutaneous Lovenox for DVT prophylaxis.  She did have a history of diabetes mellitus with peripheral neuropathy, hemoglobin A1c of 6.5, remained on Glucophage.  Her blood sugar is well controlled.  She had a history of depression with some memory deficits.  She continued on Zoloft as well as low-dose Aricept with emotional support provided.  Her blood pressures remained well controlled on Lotensin 20 mg daily and would follow up with her primary MD.  She continued on prednisone chronically for history of polymyalgia rheumatica with no flare-ups during her rehab stay.  She will continue hormone supplement for hypothyroidism. She had some mild renal insufficiency creatinine 1.6 her Lotensin was initially discontinued and her Glucophage changed to Amaryl all secondary to renal insufficiency. Followup labs with creatinine 1.30 this appeared close to her baseline of 1.28 from early December.  Subtle increase in her blood pressure her Lotensin was resumed and advised to followup with her PCP in regards to medical management. The patient received weekly collaborative interdisciplinary team conferences to discuss estimated length of stay, family teaching, and any barriers to discharge.  She continued to make progressive gain, minimal  assist overall for mobility.  She scored a 39/56 on Berg testing needing some assistance for lower body activities of daily living with noted ataxia.  She lives with her son who is currently unemployed, also has a supportive daughter to assist on discharge.  Ongoing therapies would be dictated as per [employer].  DISCHARGE MEDICATIONS: 1. Xanax 0.5 mg t.i.d. as needed. 2. Aspirin 325 mg p.o. daily. 3. Lipitor 40 mg p.o. daily. 4. Lotensin 20 mg p.o. daily. 5. Vitamin D 5000 units p.o. daily. 6. Aricept 5 mg p.o. at bedtime. 7. Folic acid 1 mg b.i.d. 8. Synthroid 100 mcg p.o. daily. 9. Amaryl 2 mg daily 10.Prednisone 4 mg p.o. daily. 11.Zoloft 100 mg p.o. daily. 12.Vitamin B12 1000 mcg p.o. daily.  DIET:  Diabetic diet.  SPECIAL INSTRUCTIONS:  She would follow up with Dr. Claudette Laws on April 21, 2012, arrival at 12 p.m., Dr. Creola Corn, medical management.  Ongoing therapies were arranged as per [employer].     Mariam Dollar, P.A.   ______________________________ Erick Colace, M.D.    DA/MEDQ  D:  04/06/2012  T:  04/06/2012  Job:  [id number]  cc:   Gwen Pounds, MD

## 2012-04-06 NOTE — Progress Notes (Signed)
Physical Therapy Session Note  Patient Details  Name: LAFAYE MCELMURRY MRN: [id number] Date of Birth: 1934-06-25  Today's Date: 04/06/2012 Time: 1300-1400 Time Calculation (min): 60 min  Short Term Goals: Week 1:  PT Short Term Goal 1 (Week 1): = LTG secondary to LOS  Skilled Therapeutic Interventions/Progress Updates:    SR Walking group: focus of session gait with assistive device to and from room >150' with min guard assist. Small LOB with distractions in hallway, verbal cues on safe SPC use.  Dynamic balance activities performed in sitting and standing at mat table working on both balance and standing endurance (standing ball toss- chest pass, bounce pass, seated on exercise ball (blue) marching, alternating arm raises, seated on disc on mat table LAQs, standing kicking the ball, seated kicking the ball).  HR monitored and stayed in the 80-90 bpm range.  O2 sats stable.    Therapy Documentation Precautions:  Precautions Precautions: Fall Restrictions Weight Bearing Restrictions: No Pain: Pain Assessment Pain Assessment: No/denies pain Pain Score: 0-No pain  See FIM for current functional status  Therapy/Group: Group Therapy  Ithan Touhey B. Sesilia Poucher, PT, DPT [phone]   04/06/2012, 3:26 PM

## 2012-04-06 NOTE — Progress Notes (Signed)
Occupational Therapy Session Note  Patient Details  Name: [name] MRN: [id number] Date of Birth: 11-24-1934  Today's Date: 04/06/2012 Time: 4098-1191 Time Calculation (min): 59 min   Skilled Therapeutic Interventions/Progress Updates:    Pt seen for 1:1 OT session with focus on functional mobility, weight shifting and dynamic standing balance. Pt refused bathing and dressing this session, but was willing to complete grooming in standing at sink with supervision. Pt ambulated from room to therapy gym with supervision. No stumbling or losses of balance noted this session. Pt completed dynamic standing balance activity on Biodex with noted difficulty shifting weight forward. Pt completed bean bag toss activity, including retrieving items from floor and transporting heavy item across gym with focus on reaching outside BOS and righting reactions with close supervision, but no noted loss of balance. Pt required several rest breaks, and reported increased heart rate. Intermitent Hr and O2 checks. Pt heart rate increased to 105 with activity, but returned to 98 after rest breaks.  Therapy Documentation Precautions:  Precautions Precautions: Fall Restrictions Weight Bearing Restrictions: No    Vital Signs: Therapy Vitals Pulse Rate: 97  Pain: Pain Assessment Pain Assessment: No/denies pain  See FIM for current functional status  Therapy/Group: Individual Therapy  [name] 04/06/2012, 1:00 PM

## 2012-04-06 NOTE — Progress Notes (Signed)
Speech Language Pathology Daily Session Note  Patient Details  Name: SHAWNICE TILMON MRN: [id number] Date of Birth: 09-Feb-1935  Today's Date: 04/06/2012 Time: 1105-1130 Time Calculation (min): 25 min  Short Term Goals: Week 1: SLP Short Term Goal 1 (Week 1): Short term goals = Long term goals  Skilled Therapeutic Interventions: Skilled treatment session focused on addressing carryover of word finding strategies in unstructured conversational task.  SLP facilitated discussion regarding daily events, therapy goals and progress and patient was able to verbally express thought and ideas with increased wait time and supervision level verbal cues to utilize word finding strategies.     FIM:  Comprehension Comprehension Mode: Auditory Comprehension: 6-Follows complex conversation/direction: With extra time/assistive device Expression Expression Mode: Verbal Expression: 5-Expresses basic 90% of the time/requires cueing < 10% of the time. Social Interaction Social Interaction: 6-Interacts appropriately with others with medication or extra time (anti-anxiety, antidepressant). Problem Solving Problem Solving: 5-Solves basic problems: With no assist Memory Memory: 6-More than reasonable amt of time  Pain Pain Assessment Pain Assessment: No/denies pain Pain Score: 0-No pain  Therapy/Group: Individual Therapy  Charlane Ferretti., CCC-SLP [phone]  Saadia Dewitt 04/06/2012, 4:15 PM

## 2012-04-07 ENCOUNTER — Inpatient Hospital Stay (HOSPITAL_COMMUNITY): Payer: Medicare Other | Admitting: Physical Therapy

## 2012-04-07 ENCOUNTER — Inpatient Hospital Stay (HOSPITAL_COMMUNITY): Payer: Medicare Other | Admitting: Occupational Therapy

## 2012-04-07 ENCOUNTER — Inpatient Hospital Stay (HOSPITAL_COMMUNITY): Payer: Medicare Other | Admitting: Speech Pathology

## 2012-04-07 LAB — GLUCOSE, CAPILLARY
Glucose-Capillary: 105 mg/dL — ABNORMAL HIGH (ref 70–99)
Glucose-Capillary: 139 mg/dL — ABNORMAL HIGH (ref 70–99)
Glucose-Capillary: 92 mg/dL (ref 70–99)

## 2012-04-07 NOTE — Progress Notes (Signed)
Patient ID: [name], female   DOB: 09-28-1934, 78 y.o.   MRN: [id number]  Subjective/Complaints: 78 y.o. right-handed female with history of polymyalgia rheumatica, hypertension as well as diabetes mellitus with peripheral neuropathy. Admitted 03/31/2012 with right-sided weakness and dizziness with associated nausea and vomiting. Patient reports that she had not taken her medications in the past 2 days because she just didn't want to take them. MRI of the brain showed a 3 cm acute infarct affecting the right cerebellum without hemorrhage. MRA of the head with atherosclerotic type disease. Echocardiogram with ejection fraction of 60% and grade 1 diastolic dysfunction. Carotid Dopplers with no ICA stenosis. Patient placed on aspirin therapy as well as subcutaneous Lovenox for DVT prophylaxis No complaints ,tolerating PT   Review of Systems  Respiratory: Positive for shortness of breath.        With ambulation  Neurological: Positive for dizziness and focal weakness.       Right Lower extremity chronic weakness  All other systems reviewed and are negative.     Objective: Vital Signs: Blood pressure 146/83, pulse 71, temperature 97.5 F (36.4 C), temperature source Oral, resp. rate 19, height 5\' 3"  (1.6 m), weight 95.7 kg (210 lb 15.7 oz), SpO2 92.00%. No results found. Results for orders placed during the hospital encounter of 04/04/12 (from the past 72 hour(s))  CBC     Status: None   Collection Time    04/04/12  6:34 PM      Result Value Range   WBC 8.3  4.0 - 10.5 K/uL   RBC 4.46  3.87 - 5.11 MIL/uL   Hemoglobin 13.8  12.0 - 15.0 g/dL   HCT 40.9  81.1 - 91.4 %   MCV 93.9  78.0 - 100.0 fL   MCH 30.9  26.0 - 34.0 pg   MCHC 32.9  30.0 - 36.0 g/dL   RDW 78.2  95.6 - 21.3 %   Platelets 187  150 - 400 K/uL  CREATININE, SERUM     Status: Abnormal   Collection Time    04/04/12  6:34 PM      Result Value Range   Creatinine, Ser 1.63 (*) 0.50 - 1.10 mg/dL   GFR calc non Af Amer  30 (*) >90 mL/min   GFR calc Af Amer 34 (*) >90 mL/min   Comment:            The eGFR has been calculated     using the CKD EPI equation.     This calculation has not been     validated in all clinical     situations.     eGFR's persistently     <90 mL/min signify     possible Chronic Kidney Disease.  GLUCOSE, CAPILLARY     Status: Abnormal   Collection Time    04/04/12  8:43 PM      Result Value Range   Glucose-Capillary 110 (*) 70 - 99 mg/dL   Comment 1 Notify RN    CBC WITH DIFFERENTIAL     Status: None   Collection Time    04/05/12  5:32 AM      Result Value Range   WBC 7.2  4.0 - 10.5 K/uL   RBC 4.52  3.87 - 5.11 MIL/uL   Hemoglobin 14.2  12.0 - 15.0 g/dL   HCT 08.6  57.8 - 46.9 %   MCV 95.4  78.0 - 100.0 fL   MCH 31.4  26.0 - 34.0 pg  MCHC 32.9  30.0 - 36.0 g/dL   RDW 16.1  09.6 - 04.5 %   Platelets 193  150 - 400 K/uL   Neutrophils Relative 48  43 - 77 %   Neutro Abs 3.4  1.7 - 7.7 K/uL   Lymphocytes Relative 38  12 - 46 %   Lymphs Abs 2.7  0.7 - 4.0 K/uL   Monocytes Relative 10  3 - 12 %   Monocytes Absolute 0.7  0.1 - 1.0 K/uL   Eosinophils Relative 4  0 - 5 %   Eosinophils Absolute 0.3  0.0 - 0.7 K/uL   Basophils Relative 1  0 - 1 %   Basophils Absolute 0.0  0.0 - 0.1 K/uL  COMPREHENSIVE METABOLIC PANEL     Status: Abnormal   Collection Time    04/05/12  5:32 AM      Result Value Range   Sodium 138  135 - 145 mEq/L   Potassium 4.4  3.5 - 5.1 mEq/L   Chloride 102  96 - 112 mEq/L   CO2 27  19 - 32 mEq/L   Glucose, Bld 109 (*) 70 - 99 mg/dL   BUN 25 (*) 6 - 23 mg/dL   Creatinine, Ser 4.09 (*) 0.50 - 1.10 mg/dL   Calcium 9.6  8.4 - 81.1 mg/dL   Total Protein 6.7  6.0 - 8.3 g/dL   Albumin 3.6  3.5 - 5.2 g/dL   AST 27  0 - 37 U/L   ALT 29  0 - 35 U/L   Alkaline Phosphatase 44  39 - 117 U/L   Total Bilirubin 0.3  0.3 - 1.2 mg/dL   GFR calc non Af Amer 38 (*) >90 mL/min   GFR calc Af Amer 45 (*) >90 mL/min   Comment:            The eGFR has been  calculated     using the CKD EPI equation.     This calculation has not been     validated in all clinical     situations.     eGFR's persistently     <90 mL/min signify     possible Chronic Kidney Disease.  GLUCOSE, CAPILLARY     Status: None   Collection Time    04/05/12  7:25 AM      Result Value Range   Glucose-Capillary 96  70 - 99 mg/dL   Comment 1 Notify RN    GLUCOSE, CAPILLARY     Status: Abnormal   Collection Time    04/05/12 10:47 AM      Result Value Range   Glucose-Capillary 115 (*) 70 - 99 mg/dL  GLUCOSE, CAPILLARY     Status: None   Collection Time    04/05/12  4:25 PM      Result Value Range   Glucose-Capillary 83  70 - 99 mg/dL   Comment 1 Notify RN    GLUCOSE, CAPILLARY     Status: Abnormal   Collection Time    04/05/12  9:17 PM      Result Value Range   Glucose-Capillary 114 (*) 70 - 99 mg/dL   Comment 1 Notify RN    GLUCOSE, CAPILLARY     Status: None   Collection Time    04/06/12  7:21 AM      Result Value Range   Glucose-Capillary 92  70 - 99 mg/dL   Comment 1 Notify RN    GLUCOSE, CAPILLARY  Status: Abnormal   Collection Time    04/06/12 11:32 AM      Result Value Range   Glucose-Capillary 108 (*) 70 - 99 mg/dL   Comment 1 Notify RN    GLUCOSE, CAPILLARY     Status: Abnormal   Collection Time    04/06/12  4:35 PM      Result Value Range   Glucose-Capillary 105 (*) 70 - 99 mg/dL   Comment 1 Notify RN    GLUCOSE, CAPILLARY     Status: Abnormal   Collection Time    04/06/12  9:35 PM      Result Value Range   Glucose-Capillary 101 (*) 70 - 99 mg/dL  GLUCOSE, CAPILLARY     Status: Abnormal   Collection Time    04/07/12  7:58 AM      Result Value Range   Glucose-Capillary 105 (*) 70 - 99 mg/dL     HEENT: normal Cardio: RRR Resp: CTA B/L GI: BS positive and Non tender Extremity:  No Edema Skin:   Intact Neuro: Alert/Oriented, Cranial Nerve II-XII normal, Normal Sensory, Normal Motor and Other trunkal ataxia Musc/Skel:  Other no  pain with RLE ROM, R knee without ligamentous laxity Gen: NAD   Assessment/Plan: 1. Functional deficits secondary to Right cerebellar thrombotic infarct which require 3+ hours per day of interdisciplinary therapy in a comprehensive inpatient rehab setting. Physiatrist is providing close team supervision and 24 hour management of active medical problems listed below. Physiatrist and rehab team continue to assess barriers to discharge/monitor patient progress toward functional and medical goals.  Would benefit from additional medical monitoring through 2/17  FIM: FIM - Bathing Bathing Steps Patient Completed: Chest;Right Arm;Left Arm;Abdomen;Front perineal area;Buttocks;Left upper leg;Right upper leg Bathing: 4: Min-Patient completes 8-9 28f 10 parts or 75+ percent  FIM - Upper Body Dressing/Undressing Upper body dressing/undressing steps patient completed: Thread/unthread right sleeve of pullover shirt/dresss;Thread/unthread left sleeve of pullover shirt/dress;Put head through opening of pull over shirt/dress Upper body dressing/undressing: 4: Min-Patient completed 75 plus % of tasks FIM - Lower Body Dressing/Undressing Lower body dressing/undressing steps patient completed: Thread/unthread right underwear leg;Pull underwear up/down;Thread/unthread right pants leg;Thread/unthread left pants leg;Pull pants up/down;Don/Doff right shoe;Don/Doff left shoe Lower body dressing/undressing: 3: Mod-Patient completed 50-74% of tasks  FIM - Toileting Toileting steps completed by patient: Adjust clothing prior to toileting;Performs perineal hygiene;Adjust clothing after toileting Toileting Assistive Devices: Grab bar or rail for support Toileting: 5: Supervision: Safety issues/verbal cues  FIM - Diplomatic Services operational officer Devices: Elevated toilet seat;Grab bars;Cane Toilet Transfers: 5-To toilet/BSC: Supervision (verbal cues/safety issues);5-From toilet/BSC: Supervision (verbal  cues/safety issues)  FIM - Banker Devices: Teacher, music: 7: Supine > Sit: No assist;5: Bed > Chair or W/C: Supervision (verbal cues/safety issues)  FIM - Locomotion: Wheelchair Locomotion: Wheelchair: 0: Activity did not occur FIM - Locomotion: Ambulation Locomotion: Ambulation Assistive Devices: Emergency planning/management officer Ambulation/Gait Assistance: 5: Supervision;4: Min guard Locomotion: Ambulation: 4: Travels 150 ft or more with minimal assistance (Pt.>75%)  Comprehension Comprehension Mode: Auditory Comprehension: 6-Follows complex conversation/direction: With extra time/assistive device  Expression Expression Mode: Verbal Expression: 5-Expresses basic 90% of the time/requires cueing < 10% of the time.  Social Interaction Social Interaction: 6-Interacts appropriately with others with medication or extra time (anti-anxiety, antidepressant).  Problem Solving Problem Solving: 5-Solves basic problems: With no assist  Memory Memory: 6-More than reasonable amt of time   1. Right thrombotic cerebellar infarct  2.. DVT Prophylaxis/Anticoagulation: Subcutaneous Lovenox. Monitor platelet counts  and any signs of bleeding  3. Mood: Zoloft 100 mg daily, Aricept 5 mg each bedtime. Provide emotional support and positive reinforcement  4. Neuropsych: This patient is capable of making decisions on his/her own behalf.  5. Diabetes mellitus with peripheral neuropathy. Hemoglobin A1c 6.5. D/C Glucophage 500 mg daily. Check CBGs a.c. and at bedtime  6. Hypertension.D/C Lotensin 20 mg daily. Monitor with increased activity  7. Polymyalgia rheumatica. Prednisone 4 mg daily. Monitor for any signs of flare up  8. Hypothyroidism. Synthroid daily  9. Hyperlipidemia. Lipitor 10.  Mild renal failure may be related to Glucophage and Lotensin will D/C monitor BMET, BP, CBG LOS (Days) 3 A FACE TO FACE EVALUATION WAS PERFORMED  Orange Hilligoss E 04/07/2012,  9:45 AM

## 2012-04-07 NOTE — Progress Notes (Signed)
Speech Language Pathology Daily Session Note  Patient Details  Name: [name] MRN: [id number] Date of Birth: 05-12-1934  Today's Date: 04/07/2012 Time: 4098-1191 Time Calculation (min): 30 min  Short Term Goals: Week 1: SLP Short Term Goal 1 (Week 1): Short term goals = Long term goals  Skilled Therapeutic Interventions: Skilled treatment session focused on addressing carryover of word finding strategies in unstructured conversational task.  Patient and SLP discussed recommendations from doctor this morning and patient required supervision verbal cues to recall and utilize description for word finding.  Patient placed phone call and as able to verbally share information with son with no instances of word finding difficulty.   SLP also facilitated session with discussion regarding changes in current medications to resolve current medical issues and patient required max assist verbal cues to demonstrate mental flexibility regarding past and present medications.    FIM:  Comprehension Comprehension Mode: Auditory Comprehension: 6-Follows complex conversation/direction: With extra time/assistive device Expression Expression Mode: Verbal Expression: 5-Expresses complex 90% of the time/cues < 10% of the time Social Interaction Social Interaction: 6-Interacts appropriately with others with medication or extra time (anti-anxiety, antidepressant). Problem Solving Problem Solving: 5-Solves basic problems: With no assist Memory Memory: 5-Requires cues to use assistive device  Pain Pain Assessment Pain Assessment: No/denies pain  Therapy/Group: Individual Therapy  Charlane Ferretti., CCC-SLP [phone]  [name] 04/07/2012, 12:19 PM

## 2012-04-07 NOTE — Progress Notes (Signed)
Social Work Patient ID: [name], female   DOB: [birth year], 78 y.o.   MRN: [id number] Met with pt who has expressed concerns over discharge tomorrow, being ready.  Unsure now if going to funeral due to weather. Discussed will talk with therapy team and she will talk with son regarding the plan, then get back together to discuss best outcome. MD aware of pt's concerns and checking labs this am.

## 2012-04-07 NOTE — Progress Notes (Signed)
Reviewed and agree with the attached treatment note.  Vivek Grealish, OTR/L 

## 2012-04-07 NOTE — Progress Notes (Signed)
Occupational Therapy Session Note  Patient Details  Name: [name] MRN: [id number] Date of Birth: 23-Mar-1934  Today's Date: 04/07/2012 Time: 0816-0900 Time Calculation (min): 44 min  Short Term Goals: No short term goals set  Skilled Therapeutic Interventions/Progress Updates:    Pt seen for ADL retraining session with focus on dynamic standing balance and functional mobility during ADL tasks. Pt engaged in UB and LB bathing using shower seat at sit <> stand level with supervision while standing. UB and LB dressing including gathering clothing with supervision and pt education on safety awareness and energy conservation techniques. Pt completed grooming in standing with distant supervision. Pt is overall supervision level with ADLs. Pt ambulated using cane from room <> family room  to prepare tea with one slight loss of balance, required no physical assist to regain balance. VC for safe use of cane due to pt dragging cane behind her ~25% of the time.  Therapy Documentation Precautions:  Precautions Precautions: Fall Restrictions Weight Bearing Restrictions: No    Pain: Pain Assessment Pain Assessment: No/denies pain   Therapy/Group: Individual Therapy  [name] 04/07/2012, 2:53 PM

## 2012-04-07 NOTE — Progress Notes (Signed)
Physical Therapy Session Note  Patient Details  Name: CHENEY GOSCH MRN: [id number] Date of Birth: March 25, 1934  Today's Date: 04/07/2012 Time: 7846-9629 Time Calculation (min): 40 min  Short Term Goals: Week 1:  PT Short Term Goal 1 (Week 1): = LTG secondary to LOS  Skilled Therapeutic Interventions/Progress Updates:   Patient just finished with OT and reporting some fatigue.  Performed gait x 150' x 2 reps with L SPC and supervision with intermittent verbal cues for sequence with SPC but no LOB during head turns.  Discussed with patient falls risk and demonstrated to patient how to perform floor > furniture transfer but also discussed indications for calling EMS or use of life alert button.  Patient performed floor > mat furniture with extra time, total verbal cues for problem solving and sequencing and min-mod A overall to complete secondary to pain and discomfort in bilat knees.  Performed stair negotiation training up and down 5 stairs x 2 reps with one rail in RUE and SPC in LUE with min A overall and verbal cues for safe step to sequence and min-mod A during descending stairs secondary to posterior LOB.  At end of session patient given ice packs for bilat knees to minimize pain and discomfort.     Therapy Documentation Precautions:  Precautions Precautions: Fall Restrictions Weight Bearing Restrictions: No Pain: Pain Assessment Pain Assessment: No/denies pain Locomotion : Ambulation Ambulation/Gait Assistance: 5: Supervision   See FIM for current functional status  Therapy/Group: Individual Therapy  Edman Circle [facility] 04/07/2012, 4:31 PM

## 2012-04-07 NOTE — Progress Notes (Signed)
Reviewed and agree with the attached treatment note.  Katara Griner, OTR/L 

## 2012-04-07 NOTE — Progress Notes (Signed)
Occupational Therapy Session Note  Patient Details  Name: [name] MRN: [id number] Date of Birth: 12-04-1934  Today's Date: 04/07/2012 Time: 1302-1332 Time Calculation (min): 30 min   Skilled Therapeutic Interventions/Progress Updates:    Pt seen for 1:1 OT session with focus on dynamic standing balance and safety awareness during functional mobility. Pt in recliner upon arrival, and reported she did not eat anything on her tray had ordered substitute food. Pt engaged in simple meal prep activity in ADL kitchen with focus on side stepping and reaching outside BOS and transporting items from high cabinets with supervision. Pt education on safety awareness and energy conservation techniques during meal prep activities. Pt ambulated from room to ADL kitchen using cane with supervision with no instances of loss of balance.   Therapy Documentation Precautions:  Precautions Precautions: Fall Restrictions Weight Bearing Restrictions: No    Pain: Pain Assessment Pain Assessment: No/denies pain   Therapy/Group: Individual Therapy  [name] 04/07/2012, 1:37 PM

## 2012-04-07 NOTE — Progress Notes (Signed)
Social Work Patient ID: [name], female   DOB: 02/16/1935, 78 y.o.   MRN: [id number] Pt aware discharge is postponed due to medical issues and the need to monitor her.  She is agreeable with this. Await MD regarding when pt will be ready for discharge.

## 2012-04-07 NOTE — Progress Notes (Signed)
Physical Therapy Note  Patient Details  Name: [name] MRN: [id number] Date of Birth: [birth year] Today's Date: 04/07/2012  1445-1525 (40 minutes) individual Pain: no complaint of pain Focus of treatment: Therapeutic exercise/gait focused on activity tolerance;  Treatment: gait 150 feet X 2  SPC   SBA ; Nustep Level 4 x 20 minutes (activity tolerance).    [name],JIM 04/07/2012, 7:24 AM

## 2012-04-08 ENCOUNTER — Inpatient Hospital Stay (HOSPITAL_COMMUNITY): Payer: Medicare Other | Admitting: Physical Therapy

## 2012-04-08 ENCOUNTER — Inpatient Hospital Stay (HOSPITAL_COMMUNITY): Payer: Medicare Other | Admitting: *Deleted

## 2012-04-08 DIAGNOSIS — I6330 Cerebral infarction due to thrombosis of unspecified cerebral artery: Secondary | ICD-10-CM

## 2012-04-08 LAB — BASIC METABOLIC PANEL
CO2: 26 mEq/L (ref 19–32)
Calcium: 9.3 mg/dL (ref 8.4–10.5)
Creatinine, Ser: 1.19 mg/dL — ABNORMAL HIGH (ref 0.50–1.10)
GFR calc non Af Amer: 44 mL/min — ABNORMAL LOW (ref >90)
Glucose, Bld: 164 mg/dL — ABNORMAL HIGH (ref 70–99)

## 2012-04-08 LAB — GLUCOSE, CAPILLARY

## 2012-04-08 NOTE — Progress Notes (Signed)
Physical Therapy Session Note  Patient Details  Name: [name] MRN: [id number] Date of Birth: March 31, 1934  Today's Date: 04/08/2012 Time: 1300-1330 Time Calculation (min): 30 min  Therapy Documentation Precautions:  Precautions Precautions: Fall Restrictions Weight Bearing Restrictions: No Pain: Pain Assessment Pain Score: 0-No pain  Gait Training: (15') using spc 2 x 200' S/Mod-I Therapeutic Exercise:(15') Nu-Step Level 4 x 12 minutes   See FIM for current functional status  Therapy/Group: Individual Therapy  Kazuko Clemence J 04/08/2012, 1:06 PM

## 2012-04-08 NOTE — Progress Notes (Signed)
Patient ID: [name], female   DOB: 1935-01-25, 78 y.o.   MRN: [id number]  Subjective/Complaints: 78 y.o. right-handed female with history of polymyalgia rheumatica, hypertension as well as diabetes mellitus with peripheral neuropathy. Admitted 03/31/2012 with right-sided weakness and dizziness with associated nausea and vomiting. Patient reports that she had not taken her medications in the past 2 days because she just didn't want to take them. MRI of the brain showed a 3 cm acute infarct affecting the right cerebellum without hemorrhage. MRA of the head with atherosclerotic type disease. Echocardiogram with ejection fraction of 60% and grade 1 diastolic dysfunction. Carotid Dopplers with no ICA stenosis. Patient placed on aspirin therapy as well as subcutaneous Lovenox for DVT prophylaxis  Patient denies complaints today. She denies any chest pain, shortness of breath. She is encouraged by her progress with therapy.   ROS Patient denies any chest pain, shortness of breath, headache. She has had a sore tongue for the past several days that is improving. She thinks that she bit her tongue.  Objective: Vital Signs: Blood pressure 132/88, pulse 67, temperature 97.8 F (36.6 C), temperature source Oral, resp. rate 18, height 5\' 3"  (1.6 m), weight 210 lb 15.7 oz (95.7 kg), SpO2 93.00%. Elderly female in no acute distress. Chest is clear to auscultation. Cardiac exam S1 and S2 are regular. Abdominal exam overweight, and bowel sounds, soft. Extremities there is no significant edema. Neurologic exam she is alert. Oropharynx is moist. There is no significant deformity of the tongue.   Assessment/Plan: 1. Functional deficits secondary to Right cerebellar thrombotic infarct   1. Right thrombotic cerebellar infarct  2.. DVT Prophylaxis/Anticoagulation: Subcutaneous Lovenox. Monitor platelet counts and any signs of bleeding  3. Mood: Zoloft 100 mg daily, Aricept 5 mg each bedtime. Provide emotional  support and positive reinforcement  4. Neuropsych: This patient is capable of making decisions on his/her own behalf.  5. Diabetes mellitus with peripheral neuropathy. Hemoglobin A1c 6.5.  CBG (last 3)   Recent Labs  04/07/12 1621 04/07/12 2030 04/08/12 0740  GLUCAP 92 99 79  6. Hypertension. She is currently off Lotensin. BP Readings from Last 3 Encounters:  04/08/12 132/88  04/04/12 102/56  11/13/10 162/106   7. Polymyalgia rheumatica. Prednisone 4 mg daily. Monitor for any signs of flare up  8. Hypothyroidism. Synthroid daily  9. Hyperlipidemia. Lipitor 10.  Mild renal failure may be related to Glucophage and Lotensin . These have been discontinued. Basic Metabolic Panel:    Component Value Date/Time   NA 138 04/05/2012 0532   K 4.4 04/05/2012 0532   CL 102 04/05/2012 0532   CO2 27 04/05/2012 0532   BUN 25* 04/05/2012 0532   CREATININE 1.32* 04/05/2012 0532   GLUCOSE 109* 04/05/2012 0532   CALCIUM 9.6 04/05/2012 0532    LOS (Days) 4 A FACE TO FACE EVALUATION WAS PERFORMED  Severino Paolo HENRY 04/08/2012, 9:05 AM

## 2012-04-08 NOTE — Progress Notes (Signed)
Occupational Therapy Note  Patient Details  Name: BRITTAY MOGLE MRN: [id number] Date of Birth: 01-24-1935 Today's Date: 04/08/2012  1st session:  Time:  0910-1000 ( ) Individual session Pain:  none   Engaged in basic ADL at shower level.  Pt. Ambulated with supervision and no assistive device to shower.  Pt tended to hold objects as she moved from place to place for balance.  Pt. Left in chair with call bell in place.     2nd session:  Time:  1330-1400  (30 min) Individual session Pain:  None:   Engaged in functional mobility, balance and map reading to finding self around the hospital.  Pt. Ambulated to gift shop using straight cane and held to OT with other hand about 50 % of time.  Pt. Walked slowly and paused from time to time to rest.  Had one sit down break during the 30 minute session.  Pt. Able to locate her room after session.  Humberto Seals 04/08/2012, 3:59 PM

## 2012-04-08 NOTE — Progress Notes (Signed)
Speech Language Pathology Daily Session Note  Patient Details  Name: [name] MRN: [id number] Date of Birth: 08-18-1934  Today's Date: 04/08/2012 Time: 1100-1130 Time Calculation (min): 30 min  Short Term Goals: Week 1: SLP Short Term Goal 1 (Week 1): Short term goals = Long term goals  Skilled Therapeutic Interventions: Skilled treatment session focused on addressing carryover of word finding strategies and recall of newly learned information. At beginning of session, pt reported she had forgotten to ask her RN for a list of her current medications, however, SLP found a printout of her current medications that was given to her by her RN yesterday. Pt required Max A verbal and question cues for mental flexibility regarding past and present medications and Mod A verbal cues to recall functions of her current medications. SLP provided pt with a medication list that was less visually distracting and easier to read/comprehend.    FIM:  Comprehension Comprehension: 6-Follows complex conversation/direction: With extra time/assistive device Expression Expression Mode: Verbal Expression: 5-Expresses complex 90% of the time/cues < 10% of the time Social Interaction Social Interaction: 6-Interacts appropriately with others with medication or extra time (anti-anxiety, antidepressant). Problem Solving Problem Solving: 5-Solves complex 90% of the time/cues < 10% of the time Memory Memory: 4-Recognizes or recalls 75 - 89% of the time/requires cueing 10 - 24% of the time  Pain Pain Assessment Pain Assessment: No/denies pain Pain Score: 0-No pain  Therapy/Group: Individual Therapy  Amayia Ciano 04/08/2012, 1:14 PM

## 2012-04-08 NOTE — Progress Notes (Signed)
Physical Therapy Session Note  Patient Details  Name: [name] MRN: [id number] Date of Birth: 16-Feb-1935  Today's Date: 04/08/2012 Time: 0830-0930 Time Calculation (min): 60 min  Short Term Goals: Week 1:  PT Short Term Goal 1 (Week 1): = LTG secondary to LOS  Therapy Documentation Precautions:  Precautions: Fall Restrictions Weight Bearing Restrictions: No Pain: B chronic knee pains, mild  Therapeutic Activity:(15') bed mobility with S/Mod-I, Transfers S/Mod-I including toilet transfers  Therapeutic Exercise: (30') Nu-Step x 12 minutes and B LE's in sitting  Gait Training: (15') using spc 2 x 200' S/Mod-I to rehab gym and 2 x 40' inside bedroom with uneven tile flooring with S/Mod-I  Therapy/Group: Individual Therapy  Rex Kras 04/08/2012, 8:31 AM

## 2012-04-09 ENCOUNTER — Inpatient Hospital Stay (HOSPITAL_COMMUNITY): Payer: Medicare Other | Admitting: Occupational Therapy

## 2012-04-09 LAB — GLUCOSE, CAPILLARY
Glucose-Capillary: 122 mg/dL — ABNORMAL HIGH (ref 70–99)
Glucose-Capillary: 105 mg/dL — ABNORMAL HIGH (ref 70–99)
Glucose-Capillary: 115 mg/dL — ABNORMAL HIGH (ref 70–99)

## 2012-04-09 NOTE — Progress Notes (Signed)
Occupational Therapy Session Note  Patient Details  Name: [name] MRN: [id number] Date of Birth: Jan 01, 1935  Today's Date: 04/09/2012 Time: 1410-1510 Time Calculation (min): 60 min  Skilled Therapeutic Interventions/Progress Updates: therapeutic activities dynamic standing level to challenge balance and endurance for independence with ADLs and functional mobility.   Patient stated, "I just get so tired so fast."  Patient with good balance but did tend to fatigued after about 2-3 of challenged balance activities.   As well, she d/o that that her knees were uncomfortable  With much activity since the left knee replacement 5 years ago.    Therapy Documentation Precautions:  Precautions Precautions: Fall Restrictions Weight Bearing Restrictions: No  Pain: denied    See FIM for current functional status  Therapy/Group: Individual Therapy  Bud Face [facility] 04/09/2012, 3:27 PM

## 2012-04-09 NOTE — Progress Notes (Signed)
Patient ID: [name], female   DOB: July 24, 1934, 78 y.o.   MRN: [id number]  Subjective/Complaints: 78 y.o. right-handed female with history of polymyalgia rheumatica, hypertension as well as diabetes mellitus with peripheral neuropathy. Admitted 03/31/2012 with right-sided weakness and dizziness with associated nausea and vomiting. Patient reports that she had not taken her medications in the past 2 days because she just didn't want to take them. MRI of the brain showed a 3 cm acute infarct affecting the right cerebellum without hemorrhage. MRA of the head with atherosclerotic type disease. Echocardiogram with ejection fraction of 60% and grade 1 diastolic dysfunction. Carotid Dopplers with no ICA stenosis. Patient placed on aspirin therapy as well as subcutaneous Lovenox for DVT prophylaxis  Patient denies complaints today.no headache or other concerns. Encouraged with rehab progress.   ROS Patient denies any chest pain, shortness of breath, headache.  Tongue is feeling  better Objective: Vital Signs: Blood pressure 159/87, pulse 67, temperature 97.5 F (36.4 C), temperature source Oral, resp. rate 17, height 5\' 3"  (1.6 m), weight 210 lb 15.7 oz (95.7 kg), SpO2 94.00%. Elderly female in no acute distress. Chest is clear to auscultation. Cardiac exam S1 and S2 are regular. Abdominal exam overweight, and bowel sounds, soft. Extremities there is no significant edema. Neurologic exam she is alert. Oropharynx is moist.  Assessment/Plan: 1. Functional deficits secondary to Right cerebellar thrombotic infarct   1. Right thrombotic cerebellar infarct  2.. DVT Prophylaxis/Anticoagulation: Subcutaneous Lovenox. Monitor platelet counts and any signs of bleeding  3. Mood: Zoloft 100 mg daily, Aricept 5 mg each bedtime. Provide emotional support and positive reinforcement  4. Neuropsych: This patient is capable of making decisions on his/her own behalf.  5. Diabetes mellitus with peripheral neuropathy.  Hemoglobin A1c 6.5.  CBG (last 3)   Recent Labs  04/08/12 1645 04/08/12 2040 04/09/12 0810  GLUCAP 99 90 122*  6. Hypertension. She is currently off Lotensin. BP Readings from Last 3 Encounters:  04/09/12 159/87  04/04/12 102/56  11/13/10 162/106   7. Polymyalgia rheumatica. Prednisone 4 mg daily. Monitor for any signs of flare up  8. Hypothyroidism. Synthroid daily  9. Hyperlipidemia. Lipitor 10.  Mild renal failure ( improving) may be related to Glucophage and Lotensin . These have been discontinued. Basic Metabolic Panel:    Component Value Date/Time   NA 136 04/08/2012 1000   K 4.2 04/08/2012 1000   CL 100 04/08/2012 1000   CO2 26 04/08/2012 1000   BUN 25* 04/08/2012 1000   CREATININE 1.19* 04/08/2012 1000   GLUCOSE 164* 04/08/2012 1000   CALCIUM 9.3 04/08/2012 1000    LOS (Days) 5 A FACE TO FACE EVALUATION WAS PERFORMED  SWORDS,BRUCE HENRY 04/09/2012, 8:58 AM

## 2012-04-10 ENCOUNTER — Encounter (HOSPITAL_COMMUNITY): Payer: Medicare Other | Admitting: Occupational Therapy

## 2012-04-10 ENCOUNTER — Inpatient Hospital Stay (HOSPITAL_COMMUNITY): Payer: Medicare Other | Admitting: Speech Pathology

## 2012-04-10 ENCOUNTER — Inpatient Hospital Stay (HOSPITAL_COMMUNITY): Payer: Medicare Other | Admitting: Physical Therapy

## 2012-04-10 LAB — BASIC METABOLIC PANEL
BUN: 22 mg/dL (ref 6–23)
CO2: 28 mEq/L (ref 19–32)
Calcium: 9.3 mg/dL (ref 8.4–10.5)
Chloride: 102 mEq/L (ref 96–112)
Creatinine, Ser: 1.3 mg/dL — ABNORMAL HIGH (ref 0.50–1.10)

## 2012-04-10 LAB — GLUCOSE, CAPILLARY

## 2012-04-10 MED ORDER — CHOLECALCIFEROL 125 MCG (5000 UT) PO CAPS: 5000.0000 [IU] | ORAL_CAPSULE | Freq: Every day | ORAL | Status: DC

## 2012-04-10 MED ORDER — LEVOTHYROXINE SODIUM 100 MCG PO TABS: 100.0000 ug | ORAL_TABLET | Freq: Every day | ORAL | Status: AC

## 2012-04-10 MED ORDER — GLIMEPIRIDE 2 MG PO TABS: 2.0000 mg | ORAL_TABLET | Freq: Every day | ORAL | Status: DC

## 2012-04-10 MED ORDER — FOLIC ACID 1 MG PO TABS: 1.0000 mg | ORAL_TABLET | Freq: Two times a day (BID) | ORAL | Status: AC

## 2012-04-10 MED ORDER — BENAZEPRIL HCL 20 MG PO TABS: 20.0000 mg | ORAL_TABLET | Freq: Every day | ORAL | Status: DC

## 2012-04-10 MED ORDER — VITAMIN B-12 1000 MCG PO TABS: 1000.0000 ug | ORAL_TABLET | Freq: Every day | ORAL | Status: DC

## 2012-04-10 MED ORDER — SERTRALINE HCL 100 MG PO TABS: 100.0000 mg | ORAL_TABLET | Freq: Every day | ORAL | Status: DC

## 2012-04-10 MED ORDER — BENAZEPRIL HCL 20 MG PO TABS
20.0000 mg | ORAL_TABLET | Freq: Every day | ORAL | Status: DC
  Administered 2012-04-10: 20 mg via ORAL
  Filled 2012-04-10 (×2): qty 1

## 2012-04-10 MED ORDER — ASPIRIN 325 MG PO TABS: 325.0000 mg | ORAL_TABLET | Freq: Every day | ORAL | Status: DC

## 2012-04-10 MED ORDER — ALPRAZOLAM 0.5 MG PO TABS: 0.5000 mg | ORAL_TABLET | Freq: Three times a day (TID) | ORAL | Status: DC | PRN

## 2012-04-10 MED ORDER — DONEPEZIL HCL 5 MG PO TABS: 5.0000 mg | ORAL_TABLET | Freq: Every day | ORAL | Status: DC

## 2012-04-10 NOTE — Progress Notes (Signed)
Speech Language Pathology Daily Session Note & Discharge Summary  Patient Details  Name: HELLON VACCARELLA MRN: [id number] Date of Birth: 24-Jul-1934  Today's Date: 04/10/2012 Time: 0920-1000 Time Calculation (min): 40 min  Short Term Goals: Week 1: SLP Short Term Goal 1 (Week 1): Short term goals = Long term goals  Skilled Therapeutic Interventions: Skilled treatment session focused on addressing carryover of word finding strategies and recall of newly learned information, as well as family education.  SLP began session, by requesting patient locate previously created medication chart. SLP also facilitated session with mod assist verbal and questions cues from SLP and son to demonstrate mental flexibility regarding past and present medications as well as recall of functions.  SLP educated patient and son regarding memory and word finding strategies with verbal discussion and handouts.     FIM:  Comprehension Comprehension Mode: Auditory Comprehension: 5-Understands complex 90% of the time/Cues < 10% of the time Expression Expression Mode: Verbal Expression: 5-Expresses complex 90% of the time/cues < 10% of the time Social Interaction Social Interaction: 6-Interacts appropriately with others with medication or extra time (anti-anxiety, antidepressant). Problem Solving Problem Solving: 5-Solves complex 90% of the time/cues < 10% of the time Memory Memory: 4-Recognizes or recalls 75 - 89% of the time/requires cueing 10 - 24% of the time FIM - Eating Eating Activity: 7: Complete independence:no helper  Pain Pain Assessment Pain Assessment: No/denies pain Pain Score: 0-No pain  Therapy/Group: Individual Therapy   Speech Language Pathology Discharge Summary  Patient Details  Name: BREELLE HOLLYWOOD MRN: [id number] Date of Birth: October 26, 1934  Today's Date: 04/10/2012  Patient has met 2 of 2 long term goals.  Patient to discharge at overall Supervision level.  Reasons goals not met:  n/a   Clinical Impression/Discharge Summary: Patient met 2 out of 2 long term/short term goals during CIR stay due to gains in functional use of word finding and memory compensation strategies.  Patient initially required min-mod assist cues to recall and then use in functional communication activities; however, patient responded well to repetition and was able to demonstrate carryover with supervision level verbal cues.  Son present for education and lives with patient; he was able to verbalize understanding of information provided and patient was provided with handouts.   Care Partner:  Caregiver Able to Provide Assistance: Yes  Type of Caregiver Assistance: Cognitive  Recommendation:  Outpatient SLP  Rationale for SLP Follow Up: Maximize cognitive function and independence;Reduce caregiver burden   Equipment: none   Reasons for discharge: Discharged from hospital;Treatment goals met   Patient/Family Agrees with Progress Made and Goals Achieved: Yes   See FIM for current functional status  Charlane Ferretti., CCC-SLP [phone]  Adler Alton 04/10/2012, 12:04 PM

## 2012-04-10 NOTE — Progress Notes (Addendum)
Patient ID: [name], female   DOB: Aug 02, 1934, 78 y.o.   MRN: [id number]  Subjective/Complaints: 78 y.o. right-handed female with history of polymyalgia rheumatica, hypertension as well as diabetes mellitus with peripheral neuropathy. Admitted 03/31/2012 with right-sided weakness and dizziness with associated nausea and vomiting. Patient reports that she had not taken her medications in the past 2 days because she just didn't want to take them. MRI of the brain showed a 3 cm acute infarct affecting the right cerebellum without hemorrhage. MRA of the head with atherosclerotic type disease. Echocardiogram with ejection fraction of 60% and grade 1 diastolic dysfunction. Carotid Dopplers with no ICA stenosis. Patient placed on aspirin therapy as well as subcutaneous Lovenox for DVT prophylaxis No complaints ,tolerating PT   Review of Systems  Respiratory: Positive for shortness of breath.        With ambulation  Neurological: Positive for dizziness and focal weakness.       Right Lower extremity chronic weakness  All other systems reviewed and are negative.     Objective: Vital Signs: Blood pressure 142/70, pulse 78, temperature 98.1 F (36.7 C), temperature source Oral, resp. rate 19, height 5\' 3"  (1.6 m), weight 95.7 kg (210 lb 15.7 oz), SpO2 97.00%. No results found. Results for orders placed during the hospital encounter of 04/04/12 (from the past 72 hour(s))  GLUCOSE, CAPILLARY     Status: Abnormal   Collection Time    04/07/12  7:58 AM      Result Value Range   Glucose-Capillary 105 (*) 70 - 99 mg/dL  GLUCOSE, CAPILLARY     Status: Abnormal   Collection Time    04/07/12 11:03 AM      Result Value Range   Glucose-Capillary 139 (*) 70 - 99 mg/dL  GLUCOSE, CAPILLARY     Status: None   Collection Time    04/07/12  4:21 PM      Result Value Range   Glucose-Capillary 92  70 - 99 mg/dL  GLUCOSE, CAPILLARY     Status: None   Collection Time    04/07/12  8:30 PM      Result  Value Range   Glucose-Capillary 99  70 - 99 mg/dL   Comment 1 Notify RN    GLUCOSE, CAPILLARY     Status: None   Collection Time    04/08/12  7:40 AM      Result Value Range   Glucose-Capillary 79  70 - 99 mg/dL  BASIC METABOLIC PANEL     Status: Abnormal   Collection Time    04/08/12 10:00 AM      Result Value Range   Sodium 136  135 - 145 mEq/L   Potassium 4.2  3.5 - 5.1 mEq/L   Chloride 100  96 - 112 mEq/L   CO2 26  19 - 32 mEq/L   Glucose, Bld 164 (*) 70 - 99 mg/dL   BUN 25 (*) 6 - 23 mg/dL   Creatinine, Ser 4.09 (*) 0.50 - 1.10 mg/dL   Calcium 9.3  8.4 - 81.1 mg/dL   GFR calc non Af Amer 44 (*) >90 mL/min   GFR calc Af Amer 50 (*) >90 mL/min   Comment:            The eGFR has been calculated     using the CKD EPI equation.     This calculation has not been     validated in all clinical     situations.  eGFR's persistently     <90 mL/min signify     possible Chronic Kidney Disease.  GLUCOSE, CAPILLARY     Status: None   Collection Time    04/08/12 11:31 AM      Result Value Range   Glucose-Capillary 85  70 - 99 mg/dL  GLUCOSE, CAPILLARY     Status: None   Collection Time    04/08/12  4:45 PM      Result Value Range   Glucose-Capillary 99  70 - 99 mg/dL  GLUCOSE, CAPILLARY     Status: None   Collection Time    04/08/12  8:40 PM      Result Value Range   Glucose-Capillary 90  70 - 99 mg/dL   Comment 1 Notify RN    GLUCOSE, CAPILLARY     Status: Abnormal   Collection Time    04/09/12  8:10 AM      Result Value Range   Glucose-Capillary 122 (*) 70 - 99 mg/dL  GLUCOSE, CAPILLARY     Status: Abnormal   Collection Time    04/09/12 11:36 AM      Result Value Range   Glucose-Capillary 104 (*) 70 - 99 mg/dL  GLUCOSE, CAPILLARY     Status: Abnormal   Collection Time    04/09/12  4:47 PM      Result Value Range   Glucose-Capillary 105 (*) 70 - 99 mg/dL  GLUCOSE, CAPILLARY     Status: Abnormal   Collection Time    04/09/12  9:27 PM      Result Value Range    Glucose-Capillary 115 (*) 70 - 99 mg/dL   Comment 1 Notify RN     Comment 2 Documented in Chart    GLUCOSE, CAPILLARY     Status: None   Collection Time    04/10/12  7:30 AM      Result Value Range   Glucose-Capillary 97  70 - 99 mg/dL   Comment 1 Notify RN       HEENT: normal Cardio: RRR Resp: CTA B/L GI: BS positive and Non tender Extremity:  No Edema Skin:   Intact Neuro: Alert/Oriented, Cranial Nerve II-XII normal, Normal Sensory, Normal Motor and Other trunkal ataxia Musc/Skel:  Other no pain with RLE ROM, R knee without ligamentous laxity Gen: NAD   Assessment/Plan: 1. Functional deficits secondary to Right cerebellar thrombotic infarct  Stable for D/C today F/u PCP in 1-2 weeks F/u PM&R 3 weeks See D/C summary See D/C instructions FIM: FIM - Bathing Bathing Steps Patient Completed: Chest;Right Arm;Left Arm;Abdomen;Front perineal area;Buttocks;Right upper leg;Left upper leg;Right lower leg (including foot);Left lower leg (including foot) Bathing: 4: Steadying assist  FIM - Upper Body Dressing/Undressing Upper body dressing/undressing steps patient completed: Thread/unthread right sleeve of pullover shirt/dresss;Thread/unthread left sleeve of pullover shirt/dress;Put head through opening of pull over shirt/dress;Pull shirt over trunk Upper body dressing/undressing: 6: More than reasonable amount of time FIM - Lower Body Dressing/Undressing Lower body dressing/undressing steps patient completed: Thread/unthread right underwear leg;Thread/unthread left underwear leg;Pull underwear up/down;Thread/unthread right pants leg;Thread/unthread left pants leg;Pull pants up/down;Don/Doff right sock;Don/Doff left sock Lower body dressing/undressing: 5: Supervision: Safety issues/verbal cues  FIM - Toileting Toileting steps completed by patient: Adjust clothing prior to toileting Toileting Assistive Devices: Grab bar or rail for support Toileting: 5: Supervision: Safety  issues/verbal cues  FIM - Diplomatic Services operational officer Devices: Grab bars Toilet Transfers: 5-To toilet/BSC: Supervision (verbal cues/safety issues);5-From toilet/BSC: Supervision (verbal cues/safety issues)  FIM -  Bed/Chair Transport planner Devices: Teacher, music: 6: Assistive device: no helper;6: Chair or W/C > Bed: No assist  FIM - Locomotion: Wheelchair Locomotion: Wheelchair: 0: Activity did not occur FIM - Locomotion: Ambulation Locomotion: Ambulation Assistive Devices: Emergency planning/management officer Ambulation/Gait Assistance: 5: Supervision Locomotion: Ambulation: 5: Travels 150 ft or more with supervision/safety issues  Comprehension Comprehension Mode: Auditory Comprehension: 7-Follows complex conversation/direction: With no assist  Expression Expression Mode: Verbal Expression: 5-Expresses complex 90% of the time/cues < 10% of the time  Social Interaction Social Interaction: 7-Interacts appropriately with others - No medications needed.  Problem Solving Problem Solving: 5-Solves complex 90% of the time/cues < 10% of the time  Memory Memory: 4-Recognizes or recalls 75 - 89% of the time/requires cueing 10 - 24% of the time   1. Right thrombotic cerebellar infarct  2.. DVT Prophylaxis/Anticoagulation: Subcutaneous Lovenox. Monitor platelet counts and any signs of bleeding  3. Mood: Zoloft 100 mg daily, Aricept 5 mg each bedtime. Provide emotional support and positive reinforcement  4. Neuropsych: This patient is capable of making decisions on his/her own behalf.  5. Diabetes mellitus with peripheral neuropathy. Hemoglobin A1c 6.5. D/C Glucophage 500 mg daily. Check CBGs a.c. and at bedtime  6. Hypertension. Resume Lotensin 20 mg daily. Monitor with increased activity  7. Polymyalgia rheumatica. Prednisone 4 mg daily. Monitor for any signs of flare up  8. Hypothyroidism. Synthroid daily  9. Hyperlipidemia. Lipitor 10.  Mild renal  failure may be related to Glucophage and Lotensin will D/C monitor last BMET improved, BPs creeping up , CBG ok.  Will resume Lotensin LOS (Days) 6 A FACE TO FACE EVALUATION WAS PERFORMED  Camillo Quadros E 04/10/2012, 7:52 AM

## 2012-04-10 NOTE — Progress Notes (Signed)
Social Work Discharge Note Discharge Note  The overall goal for the admission was met for:   Discharge location: Yes-HOME WITH SON TO PROVIDE INTERMITTENT SUPERVISION LEVEL  Length of Stay: Yes-6 DAYS  Discharge activity level: Yes-MOD/I LEVEL  Home/community participation: Yes  Services provided included: MD, RD, PT, OT, SLP, RN, Pharmacy and SW  Financial Services: Medicare and Private Insurance: COMMERICAL INSURANCE  Follow-up services arranged: Outpatient: [facility],OT,SPT 2/18 8:00-9;45 AM OT-3/4 9;45-10;30 AM  Comments (or additional information):FAMILY EDUCATION COMPLETED AND SON TO PURSUE COUNSELOR FOR MOM AND HIM REGARDING COMMUNICATION-REFERRAL GIVEN FOR DR. Leonides Cave AT [facility]  Patient/Family verbalized understanding of follow-up arrangements: Yes  Individual responsible for coordination of the follow-up plan: SELF & [name]-SON  Confirmed correct DME delivered: [name] 04/10/2012    [name]

## 2012-04-10 NOTE — Progress Notes (Signed)
Occupational Therapy Discharge Summary  Patient Details  Name: [name] MRN: [id number] Date of Birth: 23-Oct-1934  Today's Date: 04/10/2012  Patient has met 14 of 14 long term goals due to improved activity tolerance, improved balance, ability to compensate for deficits, improved attention and improved awareness.  Patient to discharge at overall Supervision level.  Patient's care partner is independent to provide the necessary physical and cognitive assistance at discharge.    Reasons goals not met: N/A  Recommendation:  Patient will benefit from ongoing skilled OT services in outpatient setting to continue to advance functional skills in the area of iADL and Reduce care partner burden.  Equipment: No equipment provided  Reasons for discharge: treatment goals met and discharge from hospital  Patient/family agrees with progress made and goals achieved: Yes  OT Discharge Pain Pain Assessment Pain Assessment: No/denies pain Pain Score: 0-No pain ADL ADL Grooming: Independent Where Assessed-Grooming: Standing at sink Upper Body Bathing: Modified independent Where Assessed-Upper Body Bathing: Shower Lower Body Bathing: Supervision/safety Where Assessed-Lower Body Bathing: Shower Upper Body Dressing: Modified independent (Device) Where Assessed-Upper Body Dressing: Chair Lower Body Dressing: Modified independent Where Assessed-Lower Body Dressing: Chair Toileting: Independent Where Assessed-Toileting: Teacher, adult education: Distant supervision Statistician Method: Event organiser: Close supervision Film/video editor Method: Designer, industrial/product: Information systems manager without back Scientist, research (physical sciences): Appears Intact Stereognosis: Not tested Hot/Cold: Not tested Proprioception: Appears Intact Coordination Gross Motor Movements are Fluid and Coordinated: Yes Fine Motor Movements are Fluid and Coordinated: Not  tested Motor  Motor Motor - Discharge Observations: Bilat hip flexor weakness  Mobility  Bed Mobility Bed Mobility: Sit to Supine Supine to Sit: 7: Independent Sit to Supine: 7: Independent  Trunk/Postural Assessment  Cervical Assessment Cervical Assessment: Exceptions to WFL (slight forward head) Thoracic Assessment Thoracic Assessment: Exceptions to [facility] (significant thoracic kyphosis) Lumbar Assessment Lumbar Assessment: Exceptions to [facility] (significant lumbar lordosis ) Postural Control Postural Control: Deficits on evaluation (impaired balance strategies, LOB to R and posterior )  Balance Standardized Balance Assessment Standardized Balance Assessment: Berg Balance Test Berg Balance Test Sit to Stand: Able to stand without using hands and stabilize independently Standing Unsupported: Able to stand safely 2 minutes Sitting with Back Unsupported but Feet Supported on Floor or Stool: Able to sit safely and securely 2 minutes Stand to Sit: Sits safely with minimal use of hands Transfers: Able to transfer safely, minor use of hands Standing Unsupported with Eyes Closed: Able to stand 10 seconds safely Standing Ubsupported with Feet Together: Able to place feet together independently and stand 1 minute safely From Standing, Reach Forward with Outstretched Arm: Can reach confidently >25 cm (10") From Standing Position, Pick up Object from Floor: Able to pick up shoe safely and easily From Standing Position, Turn to Look Behind Over each Shoulder: Looks behind from both sides and weight shifts well Turn 360 Degrees: Able to turn 360 degrees safely in 4 seconds or less Standing Unsupported, Alternately Place Feet on Step/Stool: Able to stand independently and safely and complete 8 steps in 20 seconds Standing Unsupported, One Foot in Front: Able to plae foot ahead of the other independently and hold 30 seconds Standing on One Leg: Tries to lift leg/unable to hold 3 seconds but remains  standing independently Total Score: 52 Extremity/Trunk Assessment RUE Assessment RUE Assessment: Within Functional Limits LUE Assessment LUE Assessment: Within Functional Limits  See FIM for current functional status  Leonette Monarch 04/10/2012, 3:19 PM

## 2012-04-10 NOTE — Progress Notes (Signed)
Patient discharge to home with son at 41. Discharge instruction provided by Harvel Ricks, PA.  Patient and son verbalize understanding, no further question ask.  Patient escort off unit with inpatient rehab NT.

## 2012-04-10 NOTE — Progress Notes (Signed)
Physical Therapy Discharge Summary  Patient Details  Name: [name] MRN: [id number] Date of Birth: 11-Dec-1934  Today's Date: 04/10/2012 Time: 6213-0865 Time Calculation (min): 40 min  Patient has met 8 of 8 long term goals due to improved activity tolerance, improved balance, increased strength, functional use of  right lower extremity, improved attention and improved coordination.  Patient to discharge at an ambulatory level Modified Independent with [facility] and intermittent supervision for stairs and community ambulation.   Patient's care partner is independent to provide the necessary supervision assistance at discharge.  Reasons goals not met: All goals met  Recommendation:  Patient will benefit from ongoing skilled PT services in outpatient setting to continue to advance safe functional mobility, address ongoing impairments in R sided weakness, impaired dynamic standing balance, gait, activity tolerance/endurance, and minimize fall risk.  Equipment: No equipment provided  Reasons for discharge: treatment goals met and discharge from hospital  Patient/family agrees with progress made and goals achieved: Yes  PT Discharge Precautions/Restrictions Restrictions Weight Bearing Restrictions: No Pain Pain Assessment Pain Assessment: No/denies pain Pain Score: 0-No pain Cognition Orientation Level: Oriented X4; still presents with impaired short term memory/recall, problem solving and sequencing novel tasks Sensation Sensation Light Touch: Appears Intact Stereognosis: Not tested Hot/Cold: Not tested Proprioception: Appears Intact Coordination Gross Motor Movements are Fluid and Coordinated: Yes Fine Motor Movements are Fluid and Coordinated: Not tested Motor  Motor Motor - Discharge Observations: Bilat hip flexor weakness   Mobility Bed Mobility Bed Mobility: Sit to Supine Supine to Sit: 7: Independent Sit to Supine: 7: Independent Transfers Stand Pivot Transfers: 6:  Modified independent (Device/Increase time) Stand Pivot Transfer Details (indicate cue type and reason): Mod I with UE support on SPC during pivoting; also demonstrated stand pivot to simulated low car with supervision with patient entering car by placing L foot in first and then sitting with UE support on car door; able to exit car with supervision with verbal cues for hand placement when standing from low car.  Did not have patient demonstrate floor > furniture transfer secondary to knee pain but therapist did demonstrate sequence to patient's son and how son to assist with floor > furniture transfer and use of Life Alert when alone or if fracture, CVA, concussion or bleeding suspected.  Son verbalized understanding Locomotion  Ambulation Ambulation/Gait Assistance: 6: Modified independent (Device/Increase time) Ambulation Distance (Feet): 150 Feet Assistive device: Straight cane Gait Gait Pattern: Step-through pattern;Decreased trunk rotation Gait velocity: 2.73 indicating safe for independent community ambulation Stairs / Additional Locomotion Stairs: Yes Stairs Assistance: 5: Supervision Stairs Assistance Details (indicate cue type and reason): Visual and verbal cues for safe sequence with cane with step to sequence. Stair Management Technique: One rail Right;Step to pattern;Forwards;With cane Number of Stairs: 8 Wheelchair Mobility Wheelchair Mobility: No  Trunk/Postural Assessment  Cervical Assessment Cervical Assessment: Exceptions to [facility] (slight forward head) Thoracic Assessment Thoracic Assessment: Exceptions to [facility] (significant thoracic kyphosis) Lumbar Assessment Lumbar Assessment: Exceptions to [facility] (significant lumbar lordosis ) Postural Control Postural Control: Deficits on evaluation (impaired balance strategies) Balance Standardized Balance Assessment Standardized Balance Assessment: Berg Balance Test Berg Balance Test Sit to Stand: Able to stand without using hands and  stabilize independently Standing Unsupported: Able to stand safely 2 minutes Sitting with Back Unsupported but Feet Supported on Floor or Stool: Able to sit safely and securely 2 minutes Stand to Sit: Sits safely with minimal use of hands Transfers: Able to transfer safely, minor use of hands Standing Unsupported with Eyes  Closed: Able to stand 10 seconds safely Standing Ubsupported with Feet Together: Able to place feet together independently and stand 1 minute safely From Standing, Reach Forward with Outstretched Arm: Can reach confidently >25 cm (10") From Standing Position, Pick up Object from Floor: Able to pick up shoe safely and easily From Standing Position, Turn to Look Behind Over each Shoulder: Looks behind from both sides and weight shifts well Turn 360 Degrees: Able to turn 360 degrees safely in 4 seconds or less Standing Unsupported, Alternately Place Feet on Step/Stool: Able to stand independently and safely and complete 8 steps in 20 seconds Standing Unsupported, One Foot in Front: Able to plae foot ahead of the other independently and hold 30 seconds Standing on One Leg: Tries to lift leg/unable to hold 3 seconds but remains standing independently Total Score: 52 Patient demonstrates increased fall risk as noted by score of 52/56 on Berg Balance Scale.  (<36= high risk for falls, close to 100%; 37-45 significant >80%; 46-51 moderate >50%; 52-55 lower >25%)  Extremity Assessment      RLE Assessment RLE Assessment: Exceptions to [facility] RLE Strength RLE Overall Strength: Deficits RLE Overall Strength Comments: 5/5 except hip flexion 3/5 LLE Assessment LLE Assessment: Exceptions to [facility] LLE Strength LLE Overall Strength Comments: 5/5 except hip flexion 3/5  See FIM for current functional status  Edman Circle [facility] 04/10/2012, 10:50 AM

## 2012-04-10 NOTE — Progress Notes (Signed)
Occupational Therapy Session Note  Patient Details  Name: [name] MRN: [id number] Date of Birth: 1934-09-16  Today's Date: 04/10/2012 Time: 4098-1191 Time Calculation (min): 40 min   Skilled Therapeutic Interventions/Progress Updates:    Pt seen for 1:1 OT session with focus on family education for safety during ADLs at home. Pt completed walk-in shower transfer, bed mobility, and toilet transfer from low commode with supervision. Pt donned socks and shoes independently. Recommended supervision during shower and shower transfers at home. Pt and son verbalized understanding.  Therapy Documentation Precautions:  Precautions Precautions: Fall Restrictions Weight Bearing Restrictions: No   Therapy/Group: Individual Therapy  [name] 04/10/2012, 4:02 PM

## 2012-04-10 NOTE — Progress Notes (Signed)
Reviewed and agree with the attached treatment note.  Demyah Smyre, OTR/L 

## 2012-04-11 ENCOUNTER — Ambulatory Visit: Payer: Medicare Other | Admitting: Occupational Therapy

## 2012-04-11 ENCOUNTER — Ambulatory Visit
Payer: Medicare Other | Attending: Physical Medicine & Rehabilitation | Admitting: Rehabilitative and Restorative Service Providers"

## 2012-04-11 ENCOUNTER — Ambulatory Visit: Payer: Medicare Other

## 2012-04-11 DIAGNOSIS — M6281 Muscle weakness (generalized): Secondary | ICD-10-CM | POA: Diagnosis not present

## 2012-04-11 DIAGNOSIS — R269 Unspecified abnormalities of gait and mobility: Secondary | ICD-10-CM | POA: Diagnosis not present

## 2012-04-11 DIAGNOSIS — I69919 Unspecified symptoms and signs involving cognitive functions following unspecified cerebrovascular disease: Secondary | ICD-10-CM | POA: Insufficient documentation

## 2012-04-11 DIAGNOSIS — R279 Unspecified lack of coordination: Secondary | ICD-10-CM | POA: Diagnosis not present

## 2012-04-11 DIAGNOSIS — Z5189 Encounter for other specified aftercare: Secondary | ICD-10-CM | POA: Insufficient documentation

## 2012-04-18 ENCOUNTER — Ambulatory Visit: Payer: Medicare Other | Admitting: Rehabilitative and Restorative Service Providers"

## 2012-04-18 ENCOUNTER — Ambulatory Visit: Payer: Medicare Other

## 2012-04-18 ENCOUNTER — Ambulatory Visit: Payer: Medicare Other | Admitting: Occupational Therapy

## 2012-04-18 DIAGNOSIS — Z5189 Encounter for other specified aftercare: Secondary | ICD-10-CM | POA: Diagnosis not present

## 2012-04-18 DIAGNOSIS — M6281 Muscle weakness (generalized): Secondary | ICD-10-CM | POA: Diagnosis not present

## 2012-04-18 DIAGNOSIS — I69919 Unspecified symptoms and signs involving cognitive functions following unspecified cerebrovascular disease: Secondary | ICD-10-CM | POA: Diagnosis not present

## 2012-04-18 DIAGNOSIS — R269 Unspecified abnormalities of gait and mobility: Secondary | ICD-10-CM | POA: Diagnosis not present

## 2012-04-18 DIAGNOSIS — R279 Unspecified lack of coordination: Secondary | ICD-10-CM | POA: Diagnosis not present

## 2012-04-21 ENCOUNTER — Ambulatory Visit (HOSPITAL_BASED_OUTPATIENT_CLINIC_OR_DEPARTMENT_OTHER): Payer: Medicare Other | Admitting: Physical Medicine & Rehabilitation

## 2012-04-21 ENCOUNTER — Ambulatory Visit: Payer: Medicare Other | Admitting: Physical Therapy

## 2012-04-21 ENCOUNTER — Encounter: Payer: Medicare Other | Attending: Physical Medicine & Rehabilitation

## 2012-04-21 ENCOUNTER — Encounter: Payer: Self-pay | Admitting: Physical Medicine & Rehabilitation

## 2012-04-21 ENCOUNTER — Ambulatory Visit: Payer: Medicare Other

## 2012-04-21 VITALS — BP 148/58 | HR 88 | Resp 16 | Ht 63.0 in | Wt 217.0 lb

## 2012-04-21 DIAGNOSIS — I69919 Unspecified symptoms and signs involving cognitive functions following unspecified cerebrovascular disease: Secondary | ICD-10-CM | POA: Insufficient documentation

## 2012-04-21 DIAGNOSIS — I69993 Ataxia following unspecified cerebrovascular disease: Secondary | ICD-10-CM

## 2012-04-21 DIAGNOSIS — E039 Hypothyroidism, unspecified: Secondary | ICD-10-CM | POA: Diagnosis not present

## 2012-04-21 DIAGNOSIS — R269 Unspecified abnormalities of gait and mobility: Secondary | ICD-10-CM | POA: Diagnosis not present

## 2012-04-21 DIAGNOSIS — K219 Gastro-esophageal reflux disease without esophagitis: Secondary | ICD-10-CM | POA: Insufficient documentation

## 2012-04-21 DIAGNOSIS — I129 Hypertensive chronic kidney disease with stage 1 through stage 4 chronic kidney disease, or unspecified chronic kidney disease: Secondary | ICD-10-CM | POA: Insufficient documentation

## 2012-04-21 DIAGNOSIS — G4733 Obstructive sleep apnea (adult) (pediatric): Secondary | ICD-10-CM | POA: Insufficient documentation

## 2012-04-21 DIAGNOSIS — R279 Unspecified lack of coordination: Secondary | ICD-10-CM | POA: Diagnosis not present

## 2012-04-21 DIAGNOSIS — F3289 Other specified depressive episodes: Secondary | ICD-10-CM | POA: Insufficient documentation

## 2012-04-21 DIAGNOSIS — M129 Arthropathy, unspecified: Secondary | ICD-10-CM | POA: Diagnosis not present

## 2012-04-21 DIAGNOSIS — M353 Polymyalgia rheumatica: Secondary | ICD-10-CM | POA: Insufficient documentation

## 2012-04-21 DIAGNOSIS — N289 Disorder of kidney and ureter, unspecified: Secondary | ICD-10-CM | POA: Diagnosis not present

## 2012-04-21 DIAGNOSIS — I639 Cerebral infarction, unspecified: Secondary | ICD-10-CM

## 2012-04-21 DIAGNOSIS — Z808 Family history of malignant neoplasm of other organs or systems: Secondary | ICD-10-CM | POA: Insufficient documentation

## 2012-04-21 DIAGNOSIS — E1142 Type 2 diabetes mellitus with diabetic polyneuropathy: Secondary | ICD-10-CM | POA: Insufficient documentation

## 2012-04-21 DIAGNOSIS — E1149 Type 2 diabetes mellitus with other diabetic neurological complication: Secondary | ICD-10-CM | POA: Insufficient documentation

## 2012-04-21 DIAGNOSIS — N189 Chronic kidney disease, unspecified: Secondary | ICD-10-CM | POA: Diagnosis not present

## 2012-04-21 DIAGNOSIS — I6350 Cerebral infarction due to unspecified occlusion or stenosis of unspecified cerebral artery: Secondary | ICD-10-CM | POA: Diagnosis not present

## 2012-04-21 DIAGNOSIS — Z5189 Encounter for other specified aftercare: Secondary | ICD-10-CM | POA: Diagnosis not present

## 2012-04-21 DIAGNOSIS — E785 Hyperlipidemia, unspecified: Secondary | ICD-10-CM | POA: Insufficient documentation

## 2012-04-21 DIAGNOSIS — F329 Major depressive disorder, single episode, unspecified: Secondary | ICD-10-CM | POA: Diagnosis not present

## 2012-04-21 DIAGNOSIS — I69393 Ataxia following cerebral infarction: Secondary | ICD-10-CM

## 2012-04-21 DIAGNOSIS — M6281 Muscle weakness (generalized): Secondary | ICD-10-CM | POA: Diagnosis not present

## 2012-04-21 NOTE — Progress Notes (Signed)
Subjective:    Patient ID: [name], female    DOB: 31-Jul-1934, 78 y.o.   MRN: [id number]  HPI This is a 78 year old right-handed female  with history of polymyalgia rheumatica, diabetes mellitus with  peripheral neuropathy who was admitted March 31, 2012 with right-sided  weakness, dizziness with associated nausea, vomiting. The patient  reports she had not taken her medications for 2 days because she just  did not want to take them. MRI of the brain showed a 3 cm acute infarct  affecting the right cerebellum without hemorrhage. MRA of the head with  atherosclerotic type disease. Echocardiogram with ejection fraction of  60% and grade 1 diastolic dysfunction. Carotid Dopplers with no ICA  stenosis. The patient placed on aspirin therapy as well as subcutaneous  Lovenox for DVT prophylaxis. Hemoglobin A1c of 6.5 and remained on  Glucophage.  Pain Inventory Average Pain 0 Pain Right Now 0 My pain is dull  In the last 24 hours, has pain interfered with the following? General activity 0 Relation with others 0 Enjoyment of life 0 What TIME of day is your pain at its worst? n/a Sleep (in general) Fair  Pain is worse with: walking Pain improves with: rest Relief from Meds: 6  Mobility use a cane how many minutes can you walk? 20 ability to climb steps?  yes do you drive?  no transfers alone Do you have any goals in this area?  yes  Function retired  Neuro/Psych bladder control problems bowel control problems trouble walking  Prior Studies Any changes since last visit?  no  Physicians involved in your care Any changes since last visit?  no   Family History  Problem Relation Age of Onset  . Heart failure Mother    History   Social History  . Marital Status: Widowed    Spouse Name: N/A    Number of Children: N/A  . Years of Education: N/A   Social History Main Topics  . Smoking status: Former Smoker    Quit date: 11/02/2004  . Smokeless tobacco:  Never Used  . Alcohol Use: Yes     Comment: seldom  . Drug Use: No  . Sexually Active:    Other Topics Concern  . None   Social History Narrative  . None   Past Surgical History  Procedure Laterality Date  . Total knee arthroplasty      Left knee  . Tonsillectomy    . Appendectomy    . Hernia repair    . Rotator cuff repair      RT SHOULDER  . Wrist fracture surgery     Past Medical History  Diagnosis Date  . Hyperlipidemia   . Hypertension   . Obstructive sleep apnea   . Bifascicular block   . Diabetes mellitus   . Thyroid disease   . Hypothyroidism   . Gastric ulcer   . Squamous cell skin cancer   . Shortness of breath   . Stroke 03/31/2012  . GERD (gastroesophageal reflux disease)   . Kidney stones     HX OF  . Arthritis    BP 148/58  Pulse 88  Resp 16  Ht 5\' 3"  (1.6 m)  Wt 217 lb (98.431 kg)  BMI 38.45 kg/m2  SpO2 95%     Review of Systems  Respiratory: Positive for apnea, shortness of breath and wheezing.   Musculoskeletal: Positive for gait problem.  All other systems reviewed and are negative.  Objective:   Physical Exam  Nursing note reviewed. Constitutional: She is oriented to person, place, and time. She appears well-developed.  Eyes: Pupils are equal, round, and reactive to light.  Neurological: She is alert and oriented to person, place, and time. She has normal strength and normal reflexes. No cranial nerve deficit or sensory deficit. Gait abnormal.  Gait is wide based  Psychiatric: She has a normal mood and affect. Her speech is normal and behavior is normal. Judgment and thought content normal. Cognition and memory are impaired. She exhibits abnormal recent memory.  Remebers 1/3 objects after delay        Assessment & Plan:  1. Right thrombotic cerebellar infarction.  residual confusion Has PT scheduled through March but likely will be done sooner SLP for memory deficits OT for fine motor and cognition Primary Care  follow up for : 3. Depression.  4. Diabetes mellitus with peripheral neuropathy.  5. Hypertension.  6. Polymyalgia rheumatica.  7. Hypothyroidism.  8. Hyperlipidemia.  9. Mild renal insufficiency

## 2012-04-21 NOTE — Patient Instructions (Addendum)
We discussed that cognitive recovery after his stroke may take up to 12 months and physical recovery may take up to 6 months. You can call me if there are any new issues related to stroke or rehabilitation. If you feel like you're having another stroke call 911

## 2012-04-24 DIAGNOSIS — E039 Hypothyroidism, unspecified: Secondary | ICD-10-CM | POA: Diagnosis not present

## 2012-04-24 DIAGNOSIS — E785 Hyperlipidemia, unspecified: Secondary | ICD-10-CM | POA: Diagnosis not present

## 2012-04-24 DIAGNOSIS — I10 Essential (primary) hypertension: Secondary | ICD-10-CM | POA: Diagnosis not present

## 2012-04-25 ENCOUNTER — Encounter: Payer: Medicare Other | Admitting: Occupational Therapy

## 2012-04-27 ENCOUNTER — Ambulatory Visit: Payer: Medicare Other

## 2012-04-27 ENCOUNTER — Ambulatory Visit: Payer: Medicare Other | Admitting: Occupational Therapy

## 2012-04-27 ENCOUNTER — Ambulatory Visit: Payer: Medicare Other | Attending: Physical Medicine & Rehabilitation | Admitting: Physical Therapy

## 2012-04-27 DIAGNOSIS — I69919 Unspecified symptoms and signs involving cognitive functions following unspecified cerebrovascular disease: Secondary | ICD-10-CM | POA: Insufficient documentation

## 2012-04-27 DIAGNOSIS — I69928 Other speech and language deficits following unspecified cerebrovascular disease: Secondary | ICD-10-CM | POA: Insufficient documentation

## 2012-04-27 DIAGNOSIS — M6281 Muscle weakness (generalized): Secondary | ICD-10-CM | POA: Diagnosis not present

## 2012-04-27 DIAGNOSIS — Z5189 Encounter for other specified aftercare: Secondary | ICD-10-CM | POA: Diagnosis not present

## 2012-04-27 DIAGNOSIS — R279 Unspecified lack of coordination: Secondary | ICD-10-CM | POA: Diagnosis not present

## 2012-04-27 DIAGNOSIS — R269 Unspecified abnormalities of gait and mobility: Secondary | ICD-10-CM | POA: Diagnosis not present

## 2012-04-28 ENCOUNTER — Ambulatory Visit: Payer: Medicare Other

## 2012-04-28 ENCOUNTER — Ambulatory Visit: Payer: Medicare Other | Admitting: Occupational Therapy

## 2012-04-28 ENCOUNTER — Ambulatory Visit: Payer: Medicare Other | Admitting: Physical Therapy

## 2012-05-02 ENCOUNTER — Ambulatory Visit: Payer: Medicare Other | Admitting: Occupational Therapy

## 2012-05-02 ENCOUNTER — Ambulatory Visit: Payer: Medicare Other | Admitting: Physical Therapy

## 2012-05-02 ENCOUNTER — Telehealth: Payer: Self-pay | Admitting: Cardiology

## 2012-05-02 ENCOUNTER — Ambulatory Visit: Payer: Medicare Other

## 2012-05-02 DIAGNOSIS — M6281 Muscle weakness (generalized): Secondary | ICD-10-CM | POA: Diagnosis not present

## 2012-05-02 DIAGNOSIS — I69928 Other speech and language deficits following unspecified cerebrovascular disease: Secondary | ICD-10-CM | POA: Diagnosis not present

## 2012-05-02 DIAGNOSIS — I69919 Unspecified symptoms and signs involving cognitive functions following unspecified cerebrovascular disease: Secondary | ICD-10-CM | POA: Diagnosis not present

## 2012-05-02 DIAGNOSIS — Z5189 Encounter for other specified aftercare: Secondary | ICD-10-CM | POA: Diagnosis not present

## 2012-05-02 DIAGNOSIS — R279 Unspecified lack of coordination: Secondary | ICD-10-CM | POA: Diagnosis not present

## 2012-05-02 DIAGNOSIS — R269 Unspecified abnormalities of gait and mobility: Secondary | ICD-10-CM | POA: Diagnosis not present

## 2012-05-02 NOTE — Telephone Encounter (Signed)
Samples placed at front desk.  Lot # [id number] / EXP [id number].

## 2012-05-02 NOTE — Telephone Encounter (Signed)
New Problem:    Patient called in wanting to know if there were any samples of rosuvastatin (CRESTOR) 10 MG tablet available for her to have.  Please call back.

## 2012-05-03 DIAGNOSIS — M810 Age-related osteoporosis without current pathological fracture: Secondary | ICD-10-CM | POA: Diagnosis not present

## 2012-05-05 ENCOUNTER — Ambulatory Visit: Payer: Medicare Other | Admitting: Occupational Therapy

## 2012-05-05 ENCOUNTER — Ambulatory Visit: Payer: Medicare Other | Admitting: Rehabilitative and Restorative Service Providers"

## 2012-05-05 ENCOUNTER — Ambulatory Visit: Payer: Medicare Other

## 2012-05-05 DIAGNOSIS — I69919 Unspecified symptoms and signs involving cognitive functions following unspecified cerebrovascular disease: Secondary | ICD-10-CM | POA: Diagnosis not present

## 2012-05-05 DIAGNOSIS — R279 Unspecified lack of coordination: Secondary | ICD-10-CM | POA: Diagnosis not present

## 2012-05-05 DIAGNOSIS — M6281 Muscle weakness (generalized): Secondary | ICD-10-CM | POA: Diagnosis not present

## 2012-05-05 DIAGNOSIS — I69928 Other speech and language deficits following unspecified cerebrovascular disease: Secondary | ICD-10-CM | POA: Diagnosis not present

## 2012-05-05 DIAGNOSIS — R269 Unspecified abnormalities of gait and mobility: Secondary | ICD-10-CM | POA: Diagnosis not present

## 2012-05-05 DIAGNOSIS — Z5189 Encounter for other specified aftercare: Secondary | ICD-10-CM | POA: Diagnosis not present

## 2012-05-08 DIAGNOSIS — E039 Hypothyroidism, unspecified: Secondary | ICD-10-CM | POA: Diagnosis not present

## 2012-05-08 DIAGNOSIS — I6350 Cerebral infarction due to unspecified occlusion or stenosis of unspecified cerebral artery: Secondary | ICD-10-CM | POA: Diagnosis not present

## 2012-05-08 DIAGNOSIS — E785 Hyperlipidemia, unspecified: Secondary | ICD-10-CM | POA: Diagnosis not present

## 2012-05-08 DIAGNOSIS — I10 Essential (primary) hypertension: Secondary | ICD-10-CM | POA: Diagnosis not present

## 2012-05-09 ENCOUNTER — Ambulatory Visit: Payer: Medicare Other

## 2012-05-09 ENCOUNTER — Ambulatory Visit: Payer: Medicare Other | Admitting: Rehabilitative and Restorative Service Providers"

## 2012-05-09 ENCOUNTER — Ambulatory Visit: Payer: Medicare Other | Admitting: Occupational Therapy

## 2012-05-11 ENCOUNTER — Ambulatory Visit: Payer: Medicare Other

## 2012-05-11 ENCOUNTER — Ambulatory Visit: Payer: Medicare Other | Admitting: Physical Therapy

## 2012-05-11 ENCOUNTER — Ambulatory Visit: Payer: Medicare Other | Admitting: Occupational Therapy

## 2012-05-11 DIAGNOSIS — R279 Unspecified lack of coordination: Secondary | ICD-10-CM | POA: Diagnosis not present

## 2012-05-11 DIAGNOSIS — Z5189 Encounter for other specified aftercare: Secondary | ICD-10-CM | POA: Diagnosis not present

## 2012-05-11 DIAGNOSIS — I69919 Unspecified symptoms and signs involving cognitive functions following unspecified cerebrovascular disease: Secondary | ICD-10-CM | POA: Diagnosis not present

## 2012-05-11 DIAGNOSIS — I69928 Other speech and language deficits following unspecified cerebrovascular disease: Secondary | ICD-10-CM | POA: Diagnosis not present

## 2012-05-11 DIAGNOSIS — R269 Unspecified abnormalities of gait and mobility: Secondary | ICD-10-CM | POA: Diagnosis not present

## 2012-05-11 DIAGNOSIS — M6281 Muscle weakness (generalized): Secondary | ICD-10-CM | POA: Diagnosis not present

## 2012-05-15 ENCOUNTER — Ambulatory Visit: Payer: Medicare Other | Admitting: Speech Pathology

## 2012-05-15 ENCOUNTER — Ambulatory Visit: Payer: Medicare Other | Admitting: Physical Therapy

## 2012-05-15 ENCOUNTER — Ambulatory Visit: Payer: Medicare Other | Admitting: Occupational Therapy

## 2012-05-15 DIAGNOSIS — Z5189 Encounter for other specified aftercare: Secondary | ICD-10-CM | POA: Diagnosis not present

## 2012-05-15 DIAGNOSIS — R269 Unspecified abnormalities of gait and mobility: Secondary | ICD-10-CM | POA: Diagnosis not present

## 2012-05-15 DIAGNOSIS — R279 Unspecified lack of coordination: Secondary | ICD-10-CM | POA: Diagnosis not present

## 2012-05-15 DIAGNOSIS — M6281 Muscle weakness (generalized): Secondary | ICD-10-CM | POA: Diagnosis not present

## 2012-05-15 DIAGNOSIS — I69919 Unspecified symptoms and signs involving cognitive functions following unspecified cerebrovascular disease: Secondary | ICD-10-CM | POA: Diagnosis not present

## 2012-05-15 DIAGNOSIS — I69928 Other speech and language deficits following unspecified cerebrovascular disease: Secondary | ICD-10-CM | POA: Diagnosis not present

## 2012-05-17 ENCOUNTER — Ambulatory Visit: Payer: Medicare Other | Admitting: Rehabilitative and Restorative Service Providers"

## 2012-05-17 ENCOUNTER — Encounter: Payer: Medicare Other | Admitting: Occupational Therapy

## 2012-05-17 ENCOUNTER — Encounter: Payer: Medicare Other | Admitting: Speech Pathology

## 2012-05-17 DIAGNOSIS — I69928 Other speech and language deficits following unspecified cerebrovascular disease: Secondary | ICD-10-CM | POA: Diagnosis not present

## 2012-05-17 DIAGNOSIS — R269 Unspecified abnormalities of gait and mobility: Secondary | ICD-10-CM | POA: Diagnosis not present

## 2012-05-17 DIAGNOSIS — M6281 Muscle weakness (generalized): Secondary | ICD-10-CM | POA: Diagnosis not present

## 2012-05-17 DIAGNOSIS — Z5189 Encounter for other specified aftercare: Secondary | ICD-10-CM | POA: Diagnosis not present

## 2012-05-17 DIAGNOSIS — I69919 Unspecified symptoms and signs involving cognitive functions following unspecified cerebrovascular disease: Secondary | ICD-10-CM | POA: Diagnosis not present

## 2012-05-17 DIAGNOSIS — R279 Unspecified lack of coordination: Secondary | ICD-10-CM | POA: Diagnosis not present

## 2012-05-19 ENCOUNTER — Telehealth: Payer: Self-pay | Admitting: Cardiology

## 2012-05-19 NOTE — Telephone Encounter (Signed)
New problem    Pt wanting samples of crestor 10mg 

## 2012-05-19 NOTE — Telephone Encounter (Signed)
Spoke to patient she stated she needs samples of crestor 10 mg.Samples of crestor 10 mg left at 3rd floor front desk.Patient past due for appointment with Dr.Jordan.Appoinment schedule 07/05/12.

## 2012-05-29 ENCOUNTER — Telehealth: Payer: Self-pay | Admitting: Cardiology

## 2012-05-29 NOTE — Telephone Encounter (Signed)
New problem    Patient suppose to be on 20 mg of crestor that was recommended by hospital . Please advise .

## 2012-05-29 NOTE — Telephone Encounter (Signed)
Returned call to patient she stated she was told at hospital discharge 04/08/12 to start crestor 20 mg daily and do not take lipitor.Patient requesting crestor 20 mg samples, will check sample closet and call her back.Patient has appointment with Dr.Jordan 07/05/12.

## 2012-05-30 ENCOUNTER — Ambulatory Visit: Payer: Medicare Other | Admitting: Rehabilitative and Restorative Service Providers"

## 2012-05-31 NOTE — Telephone Encounter (Signed)
Spoke to patient was told crestor 20 mg left at 3rd floor front desk.

## 2012-06-14 DIAGNOSIS — M25519 Pain in unspecified shoulder: Secondary | ICD-10-CM | POA: Diagnosis not present

## 2012-06-14 DIAGNOSIS — M1710 Unilateral primary osteoarthritis, unspecified knee: Secondary | ICD-10-CM | POA: Diagnosis not present

## 2012-06-14 DIAGNOSIS — M353 Polymyalgia rheumatica: Secondary | ICD-10-CM | POA: Diagnosis not present

## 2012-06-14 DIAGNOSIS — M19049 Primary osteoarthritis, unspecified hand: Secondary | ICD-10-CM | POA: Diagnosis not present

## 2012-06-14 DIAGNOSIS — IMO0002 Reserved for concepts with insufficient information to code with codable children: Secondary | ICD-10-CM | POA: Diagnosis not present

## 2012-06-15 DIAGNOSIS — M353 Polymyalgia rheumatica: Secondary | ICD-10-CM | POA: Diagnosis not present

## 2012-06-15 DIAGNOSIS — E1149 Type 2 diabetes mellitus with other diabetic neurological complication: Secondary | ICD-10-CM | POA: Diagnosis not present

## 2012-06-15 DIAGNOSIS — M810 Age-related osteoporosis without current pathological fracture: Secondary | ICD-10-CM | POA: Diagnosis not present

## 2012-06-15 DIAGNOSIS — R269 Unspecified abnormalities of gait and mobility: Secondary | ICD-10-CM | POA: Diagnosis not present

## 2012-06-15 DIAGNOSIS — I10 Essential (primary) hypertension: Secondary | ICD-10-CM | POA: Diagnosis not present

## 2012-06-15 DIAGNOSIS — R32 Unspecified urinary incontinence: Secondary | ICD-10-CM | POA: Diagnosis not present

## 2012-06-15 DIAGNOSIS — I6350 Cerebral infarction due to unspecified occlusion or stenosis of unspecified cerebral artery: Secondary | ICD-10-CM | POA: Diagnosis not present

## 2012-06-15 DIAGNOSIS — R413 Other amnesia: Secondary | ICD-10-CM | POA: Diagnosis not present

## 2012-07-05 ENCOUNTER — Encounter: Payer: Self-pay | Admitting: Cardiology

## 2012-07-05 ENCOUNTER — Ambulatory Visit (INDEPENDENT_AMBULATORY_CARE_PROVIDER_SITE_OTHER): Payer: Medicare Other | Admitting: Cardiology

## 2012-07-05 VITALS — BP 134/84 | HR 76 | Ht 63.0 in | Wt 217.0 lb

## 2012-07-05 DIAGNOSIS — I452 Bifascicular block: Secondary | ICD-10-CM

## 2012-07-05 DIAGNOSIS — I639 Cerebral infarction, unspecified: Secondary | ICD-10-CM

## 2012-07-05 DIAGNOSIS — E785 Hyperlipidemia, unspecified: Secondary | ICD-10-CM

## 2012-07-05 DIAGNOSIS — I10 Essential (primary) hypertension: Secondary | ICD-10-CM | POA: Diagnosis not present

## 2012-07-05 DIAGNOSIS — I6350 Cerebral infarction due to unspecified occlusion or stenosis of unspecified cerebral artery: Secondary | ICD-10-CM

## 2012-07-05 NOTE — Patient Instructions (Signed)
Continue your current therapy  I will see you in one year   

## 2012-07-05 NOTE — Progress Notes (Signed)
Irving Copas Date of Birth: Apr 25, 1934   History of Present Illness: [name] is seen for yearly followup. She has a history of hypertension, diabetes mellitus, and hyperlipidemia. She has a chronic bifascicular block.  She was admitted in February of this year with a cerebellar CVA. She states that she has jumbled thoughts and difficulty writing as well as some dysarthria. She is still in physical and occupational therapy. Echocardiogram and carotid Dopplers at that time were unremarkable. She denies any dizziness or syncope. Her blood pressure has been well-controlled. Lipid parameters have been acceptable.  Current Outpatient Prescriptions on File Prior to Visit  Medication Sig Dispense Refill  . acetaminophen (TYLENOL) 325 MG tablet Take 2 tablets (650 mg total) by mouth every 4 (four) hours as needed.      Marland Kitchen aspirin 325 MG tablet Take 1 tablet (325 mg total) by mouth daily.      . benazepril (LOTENSIN) 20 MG tablet Take 1 tablet (20 mg total) by mouth daily.  30 tablet  1  . Cholecalciferol 5000 UNITS capsule Take 1 capsule (5,000 Units total) by mouth daily.  30 capsule  1  . donepezil (ARICEPT) 5 MG tablet Take 1 tablet (5 mg total) by mouth at bedtime.  30 tablet  1  . folic acid (FOLVITE) 1 MG tablet Take 1 tablet (1 mg total) by mouth 2 (two) times daily.  60 tablet  1  . glimepiride (AMARYL) 2 MG tablet Take 1 tablet (2 mg total) by mouth daily with breakfast.  30 tablet  1  . levothyroxine (SYNTHROID, LEVOTHROID) 100 MCG tablet Take 1 tablet (100 mcg total) by mouth daily.  30 tablet  1  . predniSONE (DELTASONE) 1 MG tablet Take 4 tablets (4 mg total) by mouth daily with breakfast.      . pseudoephedrine (SUDAFED) 30 MG tablet Take 30 mg by mouth every 4 (four) hours as needed for congestion.      . rosuvastatin (CRESTOR) 10 MG tablet Take 2 tablets (20 mg total) by mouth daily.      . sertraline (ZOLOFT) 100 MG tablet Take 1 tablet (100 mg total) by mouth daily.  30 tablet   1  . vitamin B-12 (CYANOCOBALAMIN) 1000 MCG tablet Take 1 tablet (1,000 mcg total) by mouth daily.  30 tablet  1   No current facility-administered medications on file prior to visit.    No Known Allergies  Past Medical History  Diagnosis Date  . Hyperlipidemia   . Hypertension   . Obstructive sleep apnea   . Bifascicular block   . Diabetes mellitus   . Thyroid disease   . Hypothyroidism   . Gastric ulcer   . Squamous cell skin cancer   . Shortness of breath   . Stroke 03/31/2012  . GERD (gastroesophageal reflux disease)   . Kidney stones     HX OF  . Arthritis     Past Surgical History  Procedure Laterality Date  . Total knee arthroplasty      Left knee  . Tonsillectomy    . Appendectomy    . Hernia repair    . Rotator cuff repair      RT SHOULDER  . Wrist fracture surgery      History  Smoking status  . Former Smoker  . Quit date: 11/02/2004  Smokeless tobacco  . Never Used    History  Alcohol Use  . Yes    Comment: seldom    Family History  Problem Relation Age of Onset  . Heart failure Mother     Review of Systems: As noted in history of present illness. All other systems were reviewed and are negative.  Physical Exam: BP 134/84  Pulse 76  Ht 5\' 3"  (1.6 m)  Wt 217 lb (98.431 kg)  BMI 38.45 kg/m2  SpO2 94% She is an obese white female in no acute distress. She is normocephalic, atraumatic. Pupils are equal round and reactive to light and accommodation. Extraocular movements are full. Oropharynx is clear. Neck is supple without JVD, adenopathy, thyromegaly, or bruits. Lungs are clear. Cardiac exam reveals a grade 1/6 systolic murmur at the left sternal border. Abdomen is obese, soft, nontender. Extremities are without edema. Pedal pulses are palpable. Neurologic exam reveals mild dysarthria. LABORATORY DATA:   Assessment / Plan: 1. Chronic bifascicular block-patient is asymptomatic. She is on no rate slowing medication.  2.  Hypertension-controlled  3. Status post cerebellar CVA-on aspirin therapy.  4. Hyperlipidemia-on Crestor. She does note that her prescription is too expensive. We will check and see if we have samples. If not she may need to be switched to Lipitor.

## 2012-07-10 DIAGNOSIS — T1510XA Foreign body in conjunctival sac, unspecified eye, initial encounter: Secondary | ICD-10-CM | POA: Diagnosis not present

## 2012-07-10 DIAGNOSIS — H40059 Ocular hypertension, unspecified eye: Secondary | ICD-10-CM | POA: Diagnosis not present

## 2012-07-22 ENCOUNTER — Emergency Department (HOSPITAL_COMMUNITY)
Admission: EM | Admit: 2012-07-22 | Discharge: 2012-07-22 | Disposition: A | Discharge status: Home / Self Care | Payer: Medicare Other | Attending: Emergency Medicine | Admitting: Emergency Medicine

## 2012-07-22 ENCOUNTER — Emergency Department (HOSPITAL_COMMUNITY): Payer: Medicare Other

## 2012-07-22 ENCOUNTER — Encounter (HOSPITAL_COMMUNITY): Payer: Self-pay | Admitting: *Deleted

## 2012-07-22 DIAGNOSIS — S59909A Unspecified injury of unspecified elbow, initial encounter: Secondary | ICD-10-CM | POA: Insufficient documentation

## 2012-07-22 DIAGNOSIS — S6990XA Unspecified injury of unspecified wrist, hand and finger(s), initial encounter: Secondary | ICD-10-CM | POA: Insufficient documentation

## 2012-07-22 DIAGNOSIS — Z85828 Personal history of other malignant neoplasm of skin: Secondary | ICD-10-CM | POA: Diagnosis not present

## 2012-07-22 DIAGNOSIS — E119 Type 2 diabetes mellitus without complications: Secondary | ICD-10-CM | POA: Insufficient documentation

## 2012-07-22 DIAGNOSIS — E039 Hypothyroidism, unspecified: Secondary | ICD-10-CM | POA: Insufficient documentation

## 2012-07-22 DIAGNOSIS — G4733 Obstructive sleep apnea (adult) (pediatric): Secondary | ICD-10-CM | POA: Diagnosis not present

## 2012-07-22 DIAGNOSIS — E785 Hyperlipidemia, unspecified: Secondary | ICD-10-CM | POA: Diagnosis not present

## 2012-07-22 DIAGNOSIS — Z7982 Long term (current) use of aspirin: Secondary | ICD-10-CM | POA: Diagnosis not present

## 2012-07-22 DIAGNOSIS — Z8673 Personal history of transient ischemic attack (TIA), and cerebral infarction without residual deficits: Secondary | ICD-10-CM | POA: Insufficient documentation

## 2012-07-22 DIAGNOSIS — M1990 Unspecified osteoarthritis, unspecified site: Secondary | ICD-10-CM | POA: Diagnosis not present

## 2012-07-22 DIAGNOSIS — M25539 Pain in unspecified wrist: Secondary | ICD-10-CM | POA: Diagnosis not present

## 2012-07-22 DIAGNOSIS — Z79899 Other long term (current) drug therapy: Secondary | ICD-10-CM | POA: Insufficient documentation

## 2012-07-22 DIAGNOSIS — Z87442 Personal history of urinary calculi: Secondary | ICD-10-CM | POA: Insufficient documentation

## 2012-07-22 DIAGNOSIS — W19XXXA Unspecified fall, initial encounter: Secondary | ICD-10-CM | POA: Insufficient documentation

## 2012-07-22 DIAGNOSIS — I10 Essential (primary) hypertension: Secondary | ICD-10-CM | POA: Insufficient documentation

## 2012-07-22 DIAGNOSIS — I452 Bifascicular block: Secondary | ICD-10-CM | POA: Diagnosis not present

## 2012-07-22 DIAGNOSIS — Z87891 Personal history of nicotine dependence: Secondary | ICD-10-CM | POA: Diagnosis not present

## 2012-07-22 DIAGNOSIS — Y9229 Other specified public building as the place of occurrence of the external cause: Secondary | ICD-10-CM | POA: Insufficient documentation

## 2012-07-22 DIAGNOSIS — Y9389 Activity, other specified: Secondary | ICD-10-CM | POA: Insufficient documentation

## 2012-07-22 DIAGNOSIS — Z8719 Personal history of other diseases of the digestive system: Secondary | ICD-10-CM | POA: Insufficient documentation

## 2012-07-22 DIAGNOSIS — S6991XA Unspecified injury of right wrist, hand and finger(s), initial encounter: Secondary | ICD-10-CM

## 2012-07-22 LAB — CBC WITH DIFFERENTIAL/PLATELET
Basophils Absolute: 0 10*3/uL (ref 0.0–0.1)
Basophils Relative: 0 % (ref 0–1)
Eosinophils Absolute: 0.4 10*3/uL (ref 0.0–0.7)
Eosinophils Relative: 5 % (ref 0–5)
HCT: 40.4 % (ref 36.0–46.0)
MCH: 32 pg (ref 26.0–34.0)
MCHC: 34.4 g/dL (ref 30.0–36.0)
MCV: 92.9 fL (ref 78.0–100.0)
Monocytes Absolute: 0.7 10*3/uL (ref 0.1–1.0)
Platelets: 195 10*3/uL (ref 150–400)
RDW: 13.1 % (ref 11.5–15.5)
WBC: 8.1 10*3/uL (ref 4.0–10.5)

## 2012-07-22 LAB — POCT I-STAT, CHEM 8
BUN: 26 mg/dL — ABNORMAL HIGH (ref 6–23)
Calcium, Ion: 1.2 mmol/L (ref 1.13–1.30)
Chloride: 111 mEq/L (ref 96–112)
Glucose, Bld: 105 mg/dL — ABNORMAL HIGH (ref 70–99)
HCT: 42 % (ref 36.0–46.0)
Potassium: 4.6 mEq/L (ref 3.5–5.1)

## 2012-07-22 MED ORDER — ACETAMINOPHEN 325 MG PO TABS
650.0000 mg | ORAL_TABLET | Freq: Once | ORAL | Status: AC
Start: 2012-07-22 — End: 2012-07-22
  Administered 2012-07-22: 650 mg via ORAL
  Filled 2012-07-22: qty 2

## 2012-07-22 MED ORDER — SODIUM CHLORIDE 0.9 % IV BOLUS (SEPSIS)
1000.0000 mL | Freq: Once | INTRAVENOUS | Status: AC
  Administered 2012-07-22: 1000 mL via INTRAVENOUS

## 2012-07-22 MED ORDER — DIAZEPAM 5 MG PO TABS
5.0000 mg | ORAL_TABLET | Freq: Once | ORAL | Status: AC
  Administered 2012-07-22: 5 mg via ORAL
  Filled 2012-07-22: qty 1

## 2012-07-22 NOTE — ED Notes (Signed)
Patient transported to X-ray 

## 2012-07-22 NOTE — ED Notes (Signed)
Pt was having hypotension in fast track. No lightheadedness or dizziness. Pt is alert and oriented. No nuerological deficits. Will continue to monitor.

## 2012-07-22 NOTE — ED Notes (Signed)
Pt was walking and tripped and fell.  Pt has right lower lip lac, no loc, no neck pain, no blood thinners, pt has complaints of right wrist pain with swelling.

## 2012-07-22 NOTE — ED Notes (Signed)
L AC IV removed 

## 2012-07-22 NOTE — ED Notes (Signed)
Son drove pt home.

## 2012-07-22 NOTE — ED Provider Notes (Signed)
History     CSN: [id number]  Arrival date & time 07/22/12  1237   First MD Initiated Contact with Patient 07/22/12 1308      Chief Complaint  Patient presents with  . Fall  . Wrist Injury    (Consider location/radiation/quality/duration/timing/severity/associated sxs/prior treatment) HPI Comments: 78 y.o. Female with PMHx of CVA, HTN, DM2, bifascicular block presents today s/p what she describes as a mechanical fall in the parking lot of a grocery store bringing her bags to the car. Pt states she was in a hurry, ambulating without her usual cane, and lost her footing, falling on outstretched hands, injuring right wrist (previous fx site). Painful ROM. Able to move all digits. Pt denies LOC or neck pain, denies dizziness, visual disturbances, focal deficits. Pt is not on any anticoagulants. Minor abrasion to lower right lip, bleeding controlled.   Patient is a 78 y.o. female presenting with fall and wrist injury. The history is provided by the patient.  Fall This is a new problem. The current episode started today (right wrist pain s/p mechanical fall). The problem occurs constantly. The problem has been unchanged. Associated symptoms include myalgias. Pertinent negatives include no abdominal pain, chest pain, diaphoresis, headaches, nausea, neck pain, numbness, rash, vomiting or weakness. Exacerbated by: movement. She has tried nothing for the symptoms.  Wrist Injury Associated symptoms: no neck pain     Past Medical History  Diagnosis Date  . Hyperlipidemia   . Hypertension   . Obstructive sleep apnea   . Bifascicular block   . Diabetes mellitus   . Thyroid disease   . Hypothyroidism   . Gastric ulcer   . Squamous cell skin cancer   . Shortness of breath   . Stroke 03/31/2012  . GERD (gastroesophageal reflux disease)   . Kidney stones     HX OF  . Arthritis     Past Surgical History  Procedure Laterality Date  . Total knee arthroplasty      Left knee  . Tonsillectomy     . Appendectomy    . Hernia repair    . Rotator cuff repair      RT SHOULDER  . Wrist fracture surgery      Family History  Problem Relation Age of Onset  . Heart failure Mother     History  Substance Use Topics  . Smoking status: Former Smoker    Quit date: 11/02/2004  . Smokeless tobacco: Never Used  . Alcohol Use: Yes     Comment: seldom    OB History   Grav Para Term Preterm Abortions TAB SAB Ect Mult Living                  Review of Systems  Constitutional: Negative for diaphoresis.  HENT: Negative for nosebleeds, drooling, neck pain, neck stiffness and dental problem.   Eyes: Negative for visual disturbance.  Respiratory: Negative for apnea, chest tightness and shortness of breath.   Cardiovascular: Negative for chest pain and palpitations.  Gastrointestinal: Negative for nausea, vomiting and abdominal pain.  Genitourinary: Negative for dysuria.  Musculoskeletal: Positive for myalgias. Negative for gait problem.       Right wrist  Skin: Positive for wound. Negative for rash.       Lower lip, right side  Neurological: Negative for dizziness, weakness, light-headedness, numbness and headaches.    Allergies  Adhesive  Home Medications   Current Outpatient Rx  Name  Route  Sig  Dispense  Refill  . acetaminophen (  TYLENOL) 325 MG tablet   Oral   Take 2 tablets (650 mg total) by mouth every 4 (four) hours as needed.         Marland Kitchen aspirin 325 MG tablet   Oral   Take 1 tablet (325 mg total) by mouth daily.         . benazepril (LOTENSIN) 20 MG tablet   Oral   Take 1 tablet (20 mg total) by mouth daily.   30 tablet   1   . Cholecalciferol 5000 UNITS capsule   Oral   Take 1 capsule (5,000 Units total) by mouth daily.   30 capsule   1   . donepezil (ARICEPT) 5 MG tablet   Oral   Take 1 tablet (5 mg total) by mouth at bedtime.   30 tablet   1   . folic acid (FOLVITE) 1 MG tablet   Oral   Take 1 tablet (1 mg total) by mouth 2 (two) times daily.    60 tablet   1   . glimepiride (AMARYL) 2 MG tablet   Oral   Take 1 tablet (2 mg total) by mouth daily with breakfast.   30 tablet   1   . levothyroxine (SYNTHROID, LEVOTHROID) 100 MCG tablet   Oral   Take 1 tablet (100 mcg total) by mouth daily.   30 tablet   1   . predniSONE (DELTASONE) 1 MG tablet   Oral   Take 1 mg by mouth daily.         . rosuvastatin (CRESTOR) 10 MG tablet   Oral   Take 2 tablets (20 mg total) by mouth daily.         . sertraline (ZOLOFT) 100 MG tablet   Oral   Take 1 tablet (100 mg total) by mouth daily.   30 tablet   1   . vitamin B-12 (CYANOCOBALAMIN) 1000 MCG tablet   Oral   Take 1 tablet (1,000 mcg total) by mouth daily.   30 tablet   1     BP 102/62  Pulse 84  Temp(Src) 98.3 F (36.8 C) (Oral)  Resp 20  SpO2 92%  Physical Exam  Nursing note and vitals reviewed. Constitutional: She is oriented to person, place, and time. She appears well-developed and well-nourished. No distress.  HENT:  Head: Normocephalic and atraumatic.  Mouth/Throat:    No loose teeth  Eyes: Conjunctivae and EOM are normal.  Neck: Normal range of motion. Neck supple.  No meningeal signs  Cardiovascular: Normal rate, regular rhythm and normal heart sounds.  Exam reveals no gallop and no friction rub.   No murmur heard. Pulmonary/Chest: Effort normal and breath sounds normal. No respiratory distress. She has no wheezes. She has no rales. She exhibits no tenderness.  Abdominal: Soft. Bowel sounds are normal. She exhibits no distension. There is no tenderness. There is no rebound and no guarding.  Musculoskeletal: Normal range of motion. She exhibits no edema and no tenderness.  FROM to upper and lower extremities No step-offs noted on C-spine No tenderness to palpation of the spinous processes of the C-spine, T-spine or L-spine Full range of motion of C-spine, T-spine or L-spine   Neurological: She is alert and oriented to person, place, and time. No  cranial nerve deficit.  Speech is clear and goal oriented, follows commands Sensation normal to light touch and two point discrimination Moves extremities without ataxia, coordination intact Baseline gait and balance Baseline strength in upper and lower extremities  bilaterally including dorsiflexion and plantar flexion, strong and equal grip strength   Skin: Skin is warm and dry. She is not diaphoretic. No erythema.  Minor abrasion to right lower lip. Does not cross vermilion border. Bleeding controlled.   Psychiatric: She has a normal mood and affect.    ED Course  Procedures (including critical care time)   Date: 07/22/2012  Rate: 68  Rhythm: normal sinus rhythm  QRS Axis: normal  Intervals: normal  ST/T Wave abnormalities: normal  Conduction Disutrbances:none and left bundle branch block  Narrative Interpretation:   Old EKG Reviewed: none available   Labs Reviewed  POCT I-STAT, CHEM 8 - Abnormal; Notable for the following:    BUN 26 (*)    Creatinine, Ser 1.20 (*)    Glucose, Bld 105 (*)    All other components within normal limits  CBC WITH DIFFERENTIAL   Dg Wrist Complete Right  07/22/2012   *RADIOLOGY REPORT*  Clinical Data: Fall with right wrist pain.  History of prior fixation.  RIGHT WRIST - COMPLETE 3+ VIEW  Comparison: 02/28/2004  Findings: Moderate osteopenia.  Likely remote ulnar styloid avulsion fracture.  Prior plate screw fixation of the previously described distal radius fracture.  Degenerative changes about the radial aspect of the carpus and first metacarpal phalangeal joint.  No acute hardware complication.  Scaphoid intact.  IMPRESSION: Osteopenia with remote distal radius fixation.  No acute osseous abnormality.   Original Report Authenticated By: Jeronimo Greaves, M.D.     1. Right wrist injury, initial encounter       MDM  Imaging was negative for fx. However, pt was triaged hypotensive and continued to be hypotensive throughout her visit. Review of  records shows pt at BPs 134/84 and 148/58. Discussed with Dr. Manus Gunning who agrees with some IVF and basic labs to ensure hypotension did not play a role in today's fall.  Pt denies dizziness, lightheadedness, headaches, visual disturbances, nausea, vomiting, chest pain, shortness of breath. Neuro exam was normal and pt states she is at her baseline strength with some baseline bilateral lower extremity weakness.  Pt is followed by a cardiologist and was seen recently with no acute changes. Will move to Pod E to continue work up. In the event that labs are unconcerning, will put up for discharge with outpt follow up. Discussed reason for move to Pod E with pt who understands and is in agreement with the plan.   On re-eval, labs and EKG unconcerning. Discussed discharge with Dr. Oletta Lamas observing her in [place] [facility] who agrees she is ready for discharge. Discussed reasons to seek immediate care with pt who expresses understanding and agrees with plan.   Glade Nurse, PA-C 07/22/12 1634  Glade Nurse, PA-C 07/23/12 [phone]

## 2012-07-22 NOTE — ED Provider Notes (Signed)
Medical screening examination/treatment/procedure(s) were conducted as a shared visit with non-physician practitioner(s) and myself.  I personally evaluated the patient during the encounter  Pt with mechanical fall, wrist injury.  Pt's BP was marginal here, but pt denies CP, HA, dizziness, diaphoresis.  Blood counts show baseline electrolytes, she is not anemic.  PT's BP remains stable and she is asymptomatic, will d/c home and arrange appropriate follow up for wrist injury.  Gavin Pound. Emmelina Mcloughlin, MD 07/22/12 1454

## 2012-07-22 NOTE — ED Notes (Signed)
Called ortho tech for a sling and a ace wrap to rt. Arm. Victorino Dike ortho stated, it will be awhile. Triage EMT called to apply.

## 2012-08-03 DIAGNOSIS — J069 Acute upper respiratory infection, unspecified: Secondary | ICD-10-CM | POA: Diagnosis not present

## 2012-08-03 DIAGNOSIS — R062 Wheezing: Secondary | ICD-10-CM | POA: Diagnosis not present

## 2012-08-03 DIAGNOSIS — I6350 Cerebral infarction due to unspecified occlusion or stenosis of unspecified cerebral artery: Secondary | ICD-10-CM | POA: Diagnosis not present

## 2012-08-03 DIAGNOSIS — I10 Essential (primary) hypertension: Secondary | ICD-10-CM | POA: Diagnosis not present

## 2012-08-03 DIAGNOSIS — S20219A Contusion of unspecified front wall of thorax, initial encounter: Secondary | ICD-10-CM | POA: Diagnosis not present

## 2012-08-03 DIAGNOSIS — E119 Type 2 diabetes mellitus without complications: Secondary | ICD-10-CM | POA: Diagnosis not present

## 2012-08-03 DIAGNOSIS — S59909A Unspecified injury of unspecified elbow, initial encounter: Secondary | ICD-10-CM | POA: Diagnosis not present

## 2012-08-03 DIAGNOSIS — G4733 Obstructive sleep apnea (adult) (pediatric): Secondary | ICD-10-CM | POA: Diagnosis not present

## 2012-08-03 DIAGNOSIS — S6990XA Unspecified injury of unspecified wrist, hand and finger(s), initial encounter: Secondary | ICD-10-CM | POA: Diagnosis not present

## 2012-08-11 DIAGNOSIS — J069 Acute upper respiratory infection, unspecified: Secondary | ICD-10-CM | POA: Diagnosis not present

## 2012-08-11 DIAGNOSIS — R062 Wheezing: Secondary | ICD-10-CM | POA: Diagnosis not present

## 2012-08-11 DIAGNOSIS — S59919A Unspecified injury of unspecified forearm, initial encounter: Secondary | ICD-10-CM | POA: Diagnosis not present

## 2012-08-11 DIAGNOSIS — R0602 Shortness of breath: Secondary | ICD-10-CM | POA: Diagnosis not present

## 2012-08-11 DIAGNOSIS — R32 Unspecified urinary incontinence: Secondary | ICD-10-CM | POA: Diagnosis not present

## 2012-08-11 DIAGNOSIS — S6990XA Unspecified injury of unspecified wrist, hand and finger(s), initial encounter: Secondary | ICD-10-CM | POA: Diagnosis not present

## 2012-08-11 DIAGNOSIS — R413 Other amnesia: Secondary | ICD-10-CM | POA: Diagnosis not present

## 2012-08-11 DIAGNOSIS — M2550 Pain in unspecified joint: Secondary | ICD-10-CM | POA: Diagnosis not present

## 2012-08-11 DIAGNOSIS — S59909A Unspecified injury of unspecified elbow, initial encounter: Secondary | ICD-10-CM | POA: Diagnosis not present

## 2012-08-11 DIAGNOSIS — R252 Cramp and spasm: Secondary | ICD-10-CM | POA: Diagnosis not present

## 2012-08-15 DIAGNOSIS — J069 Acute upper respiratory infection, unspecified: Secondary | ICD-10-CM | POA: Diagnosis not present

## 2012-08-15 DIAGNOSIS — E119 Type 2 diabetes mellitus without complications: Secondary | ICD-10-CM | POA: Diagnosis not present

## 2012-08-15 DIAGNOSIS — R269 Unspecified abnormalities of gait and mobility: Secondary | ICD-10-CM | POA: Diagnosis not present

## 2012-08-15 DIAGNOSIS — W19XXXA Unspecified fall, initial encounter: Secondary | ICD-10-CM | POA: Diagnosis not present

## 2012-08-15 DIAGNOSIS — S60219A Contusion of unspecified wrist, initial encounter: Secondary | ICD-10-CM | POA: Diagnosis not present

## 2012-08-15 DIAGNOSIS — M810 Age-related osteoporosis without current pathological fracture: Secondary | ICD-10-CM | POA: Diagnosis not present

## 2012-08-16 DIAGNOSIS — M19049 Primary osteoarthritis, unspecified hand: Secondary | ICD-10-CM | POA: Diagnosis not present

## 2012-08-16 DIAGNOSIS — S62173A Displaced fracture of trapezium [larger multangular], unspecified wrist, initial encounter for closed fracture: Secondary | ICD-10-CM | POA: Diagnosis not present

## 2012-08-16 DIAGNOSIS — S63509A Unspecified sprain of unspecified wrist, initial encounter: Secondary | ICD-10-CM | POA: Diagnosis not present

## 2012-08-16 DIAGNOSIS — T8489XA Other specified complication of internal orthopedic prosthetic devices, implants and grafts, initial encounter: Secondary | ICD-10-CM | POA: Diagnosis not present

## 2012-08-17 DIAGNOSIS — E119 Type 2 diabetes mellitus without complications: Secondary | ICD-10-CM | POA: Diagnosis not present

## 2012-08-17 DIAGNOSIS — M810 Age-related osteoporosis without current pathological fracture: Secondary | ICD-10-CM | POA: Diagnosis not present

## 2012-08-17 DIAGNOSIS — W19XXXA Unspecified fall, initial encounter: Secondary | ICD-10-CM | POA: Diagnosis not present

## 2012-08-17 DIAGNOSIS — J069 Acute upper respiratory infection, unspecified: Secondary | ICD-10-CM | POA: Diagnosis not present

## 2012-08-17 DIAGNOSIS — R269 Unspecified abnormalities of gait and mobility: Secondary | ICD-10-CM | POA: Diagnosis not present

## 2012-08-17 DIAGNOSIS — S60219A Contusion of unspecified wrist, initial encounter: Secondary | ICD-10-CM | POA: Diagnosis not present

## 2012-08-22 ENCOUNTER — Telehealth: Payer: Self-pay | Admitting: Cardiology

## 2012-08-22 DIAGNOSIS — E119 Type 2 diabetes mellitus without complications: Secondary | ICD-10-CM | POA: Diagnosis not present

## 2012-08-22 DIAGNOSIS — J069 Acute upper respiratory infection, unspecified: Secondary | ICD-10-CM | POA: Diagnosis not present

## 2012-08-22 DIAGNOSIS — M810 Age-related osteoporosis without current pathological fracture: Secondary | ICD-10-CM | POA: Diagnosis not present

## 2012-08-22 DIAGNOSIS — S60219A Contusion of unspecified wrist, initial encounter: Secondary | ICD-10-CM | POA: Diagnosis not present

## 2012-08-22 DIAGNOSIS — R269 Unspecified abnormalities of gait and mobility: Secondary | ICD-10-CM | POA: Diagnosis not present

## 2012-08-22 DIAGNOSIS — W19XXXA Unspecified fall, initial encounter: Secondary | ICD-10-CM | POA: Diagnosis not present

## 2012-08-22 NOTE — Telephone Encounter (Signed)
Returned call to patient crestor 20 mg samples left at 3rd floor front desk. 

## 2012-08-22 NOTE — Telephone Encounter (Signed)
New Problem  Pt is wanting some samples of CRESTOR 20 MG.

## 2012-08-23 DIAGNOSIS — W19XXXA Unspecified fall, initial encounter: Secondary | ICD-10-CM | POA: Diagnosis not present

## 2012-08-23 DIAGNOSIS — J069 Acute upper respiratory infection, unspecified: Secondary | ICD-10-CM | POA: Diagnosis not present

## 2012-08-23 DIAGNOSIS — M810 Age-related osteoporosis without current pathological fracture: Secondary | ICD-10-CM | POA: Diagnosis not present

## 2012-08-23 DIAGNOSIS — R269 Unspecified abnormalities of gait and mobility: Secondary | ICD-10-CM | POA: Diagnosis not present

## 2012-08-23 DIAGNOSIS — E119 Type 2 diabetes mellitus without complications: Secondary | ICD-10-CM | POA: Diagnosis not present

## 2012-08-23 DIAGNOSIS — S60219A Contusion of unspecified wrist, initial encounter: Secondary | ICD-10-CM | POA: Diagnosis not present

## 2012-08-24 DIAGNOSIS — R32 Unspecified urinary incontinence: Secondary | ICD-10-CM | POA: Diagnosis not present

## 2012-08-24 DIAGNOSIS — R413 Other amnesia: Secondary | ICD-10-CM | POA: Diagnosis not present

## 2012-08-24 DIAGNOSIS — M810 Age-related osteoporosis without current pathological fracture: Secondary | ICD-10-CM | POA: Diagnosis not present

## 2012-08-24 DIAGNOSIS — E1149 Type 2 diabetes mellitus with other diabetic neurological complication: Secondary | ICD-10-CM | POA: Diagnosis not present

## 2012-08-24 DIAGNOSIS — R062 Wheezing: Secondary | ICD-10-CM | POA: Diagnosis not present

## 2012-08-24 DIAGNOSIS — I10 Essential (primary) hypertension: Secondary | ICD-10-CM | POA: Diagnosis not present

## 2012-08-24 DIAGNOSIS — I6350 Cerebral infarction due to unspecified occlusion or stenosis of unspecified cerebral artery: Secondary | ICD-10-CM | POA: Diagnosis not present

## 2012-08-24 DIAGNOSIS — E039 Hypothyroidism, unspecified: Secondary | ICD-10-CM | POA: Diagnosis not present

## 2012-08-29 DIAGNOSIS — E119 Type 2 diabetes mellitus without complications: Secondary | ICD-10-CM | POA: Diagnosis not present

## 2012-08-29 DIAGNOSIS — M810 Age-related osteoporosis without current pathological fracture: Secondary | ICD-10-CM | POA: Diagnosis not present

## 2012-08-29 DIAGNOSIS — W19XXXA Unspecified fall, initial encounter: Secondary | ICD-10-CM | POA: Diagnosis not present

## 2012-08-29 DIAGNOSIS — S60219A Contusion of unspecified wrist, initial encounter: Secondary | ICD-10-CM | POA: Diagnosis not present

## 2012-08-29 DIAGNOSIS — R269 Unspecified abnormalities of gait and mobility: Secondary | ICD-10-CM | POA: Diagnosis not present

## 2012-08-29 DIAGNOSIS — J069 Acute upper respiratory infection, unspecified: Secondary | ICD-10-CM | POA: Diagnosis not present

## 2012-08-30 DIAGNOSIS — J069 Acute upper respiratory infection, unspecified: Secondary | ICD-10-CM | POA: Diagnosis not present

## 2012-08-30 DIAGNOSIS — S60219A Contusion of unspecified wrist, initial encounter: Secondary | ICD-10-CM | POA: Diagnosis not present

## 2012-08-30 DIAGNOSIS — M810 Age-related osteoporosis without current pathological fracture: Secondary | ICD-10-CM | POA: Diagnosis not present

## 2012-08-30 DIAGNOSIS — R269 Unspecified abnormalities of gait and mobility: Secondary | ICD-10-CM | POA: Diagnosis not present

## 2012-08-30 DIAGNOSIS — W19XXXA Unspecified fall, initial encounter: Secondary | ICD-10-CM | POA: Diagnosis not present

## 2012-08-30 DIAGNOSIS — E119 Type 2 diabetes mellitus without complications: Secondary | ICD-10-CM | POA: Diagnosis not present

## 2012-08-31 DIAGNOSIS — E119 Type 2 diabetes mellitus without complications: Secondary | ICD-10-CM | POA: Diagnosis not present

## 2012-08-31 DIAGNOSIS — J069 Acute upper respiratory infection, unspecified: Secondary | ICD-10-CM | POA: Diagnosis not present

## 2012-08-31 DIAGNOSIS — S60219A Contusion of unspecified wrist, initial encounter: Secondary | ICD-10-CM | POA: Diagnosis not present

## 2012-08-31 DIAGNOSIS — M810 Age-related osteoporosis without current pathological fracture: Secondary | ICD-10-CM | POA: Diagnosis not present

## 2012-08-31 DIAGNOSIS — W19XXXA Unspecified fall, initial encounter: Secondary | ICD-10-CM | POA: Diagnosis not present

## 2012-08-31 DIAGNOSIS — R269 Unspecified abnormalities of gait and mobility: Secondary | ICD-10-CM | POA: Diagnosis not present

## 2012-09-01 DIAGNOSIS — R269 Unspecified abnormalities of gait and mobility: Secondary | ICD-10-CM | POA: Diagnosis not present

## 2012-09-01 DIAGNOSIS — S60219A Contusion of unspecified wrist, initial encounter: Secondary | ICD-10-CM | POA: Diagnosis not present

## 2012-09-01 DIAGNOSIS — J069 Acute upper respiratory infection, unspecified: Secondary | ICD-10-CM | POA: Diagnosis not present

## 2012-09-01 DIAGNOSIS — M810 Age-related osteoporosis without current pathological fracture: Secondary | ICD-10-CM | POA: Diagnosis not present

## 2012-09-01 DIAGNOSIS — E119 Type 2 diabetes mellitus without complications: Secondary | ICD-10-CM | POA: Diagnosis not present

## 2012-09-01 DIAGNOSIS — W19XXXA Unspecified fall, initial encounter: Secondary | ICD-10-CM | POA: Diagnosis not present

## 2012-09-04 DIAGNOSIS — M810 Age-related osteoporosis without current pathological fracture: Secondary | ICD-10-CM | POA: Diagnosis not present

## 2012-09-04 DIAGNOSIS — S60219A Contusion of unspecified wrist, initial encounter: Secondary | ICD-10-CM | POA: Diagnosis not present

## 2012-09-04 DIAGNOSIS — J069 Acute upper respiratory infection, unspecified: Secondary | ICD-10-CM | POA: Diagnosis not present

## 2012-09-04 DIAGNOSIS — W19XXXA Unspecified fall, initial encounter: Secondary | ICD-10-CM | POA: Diagnosis not present

## 2012-09-04 DIAGNOSIS — R269 Unspecified abnormalities of gait and mobility: Secondary | ICD-10-CM | POA: Diagnosis not present

## 2012-09-04 DIAGNOSIS — E119 Type 2 diabetes mellitus without complications: Secondary | ICD-10-CM | POA: Diagnosis not present

## 2012-09-05 DIAGNOSIS — S60219A Contusion of unspecified wrist, initial encounter: Secondary | ICD-10-CM | POA: Diagnosis not present

## 2012-09-05 DIAGNOSIS — M810 Age-related osteoporosis without current pathological fracture: Secondary | ICD-10-CM | POA: Diagnosis not present

## 2012-09-05 DIAGNOSIS — R269 Unspecified abnormalities of gait and mobility: Secondary | ICD-10-CM | POA: Diagnosis not present

## 2012-09-05 DIAGNOSIS — W19XXXA Unspecified fall, initial encounter: Secondary | ICD-10-CM | POA: Diagnosis not present

## 2012-09-05 DIAGNOSIS — J069 Acute upper respiratory infection, unspecified: Secondary | ICD-10-CM | POA: Diagnosis not present

## 2012-09-05 DIAGNOSIS — E119 Type 2 diabetes mellitus without complications: Secondary | ICD-10-CM | POA: Diagnosis not present

## 2012-09-06 DIAGNOSIS — S62173A Displaced fracture of trapezium [larger multangular], unspecified wrist, initial encounter for closed fracture: Secondary | ICD-10-CM | POA: Diagnosis not present

## 2012-09-06 DIAGNOSIS — M19049 Primary osteoarthritis, unspecified hand: Secondary | ICD-10-CM | POA: Diagnosis not present

## 2012-09-07 DIAGNOSIS — E119 Type 2 diabetes mellitus without complications: Secondary | ICD-10-CM | POA: Diagnosis not present

## 2012-09-07 DIAGNOSIS — W19XXXA Unspecified fall, initial encounter: Secondary | ICD-10-CM | POA: Diagnosis not present

## 2012-09-07 DIAGNOSIS — J069 Acute upper respiratory infection, unspecified: Secondary | ICD-10-CM | POA: Diagnosis not present

## 2012-09-07 DIAGNOSIS — M810 Age-related osteoporosis without current pathological fracture: Secondary | ICD-10-CM | POA: Diagnosis not present

## 2012-09-07 DIAGNOSIS — S60219A Contusion of unspecified wrist, initial encounter: Secondary | ICD-10-CM | POA: Diagnosis not present

## 2012-09-07 DIAGNOSIS — R269 Unspecified abnormalities of gait and mobility: Secondary | ICD-10-CM | POA: Diagnosis not present

## 2012-09-08 ENCOUNTER — Emergency Department (HOSPITAL_COMMUNITY): Payer: Medicare Other

## 2012-09-08 ENCOUNTER — Encounter (HOSPITAL_COMMUNITY): Payer: Self-pay

## 2012-09-08 ENCOUNTER — Observation Stay (HOSPITAL_COMMUNITY): Payer: Medicare Other

## 2012-09-08 ENCOUNTER — Inpatient Hospital Stay (HOSPITAL_COMMUNITY)
Admission: EM | Admit: 2012-09-08 | Discharge: 2012-09-12 | DRG: 065 | Disposition: A | Discharge status: Home / Self Care | Payer: Medicare Other | Attending: Internal Medicine | Admitting: Internal Medicine

## 2012-09-08 DIAGNOSIS — IMO0002 Reserved for concepts with insufficient information to code with codable children: Secondary | ICD-10-CM | POA: Diagnosis not present

## 2012-09-08 DIAGNOSIS — N289 Disorder of kidney and ureter, unspecified: Secondary | ICD-10-CM | POA: Diagnosis present

## 2012-09-08 DIAGNOSIS — Z9089 Acquired absence of other organs: Secondary | ICD-10-CM | POA: Diagnosis not present

## 2012-09-08 DIAGNOSIS — G4733 Obstructive sleep apnea (adult) (pediatric): Secondary | ICD-10-CM | POA: Diagnosis present

## 2012-09-08 DIAGNOSIS — I10 Essential (primary) hypertension: Secondary | ICD-10-CM | POA: Diagnosis not present

## 2012-09-08 DIAGNOSIS — M353 Polymyalgia rheumatica: Secondary | ICD-10-CM | POA: Diagnosis not present

## 2012-09-08 DIAGNOSIS — E039 Hypothyroidism, unspecified: Secondary | ICD-10-CM | POA: Diagnosis present

## 2012-09-08 DIAGNOSIS — Z8673 Personal history of transient ischemic attack (TIA), and cerebral infarction without residual deficits: Secondary | ICD-10-CM | POA: Diagnosis not present

## 2012-09-08 DIAGNOSIS — S60219A Contusion of unspecified wrist, initial encounter: Secondary | ICD-10-CM | POA: Diagnosis not present

## 2012-09-08 DIAGNOSIS — R269 Unspecified abnormalities of gait and mobility: Secondary | ICD-10-CM | POA: Diagnosis not present

## 2012-09-08 DIAGNOSIS — R5381 Other malaise: Secondary | ICD-10-CM | POA: Diagnosis not present

## 2012-09-08 DIAGNOSIS — R5383 Other fatigue: Secondary | ICD-10-CM

## 2012-09-08 DIAGNOSIS — E669 Obesity, unspecified: Secondary | ICD-10-CM | POA: Diagnosis present

## 2012-09-08 DIAGNOSIS — Z96659 Presence of unspecified artificial knee joint: Secondary | ICD-10-CM | POA: Diagnosis not present

## 2012-09-08 DIAGNOSIS — R29810 Facial weakness: Secondary | ICD-10-CM | POA: Diagnosis not present

## 2012-09-08 DIAGNOSIS — E119 Type 2 diabetes mellitus without complications: Secondary | ICD-10-CM | POA: Diagnosis present

## 2012-09-08 DIAGNOSIS — J069 Acute upper respiratory infection, unspecified: Secondary | ICD-10-CM | POA: Diagnosis not present

## 2012-09-08 DIAGNOSIS — Z87891 Personal history of nicotine dependence: Secondary | ICD-10-CM

## 2012-09-08 DIAGNOSIS — Z6839 Body mass index (BMI) 39.0-39.9, adult: Secondary | ICD-10-CM

## 2012-09-08 DIAGNOSIS — I6789 Other cerebrovascular disease: Secondary | ICD-10-CM | POA: Diagnosis not present

## 2012-09-08 DIAGNOSIS — M6281 Muscle weakness (generalized): Secondary | ICD-10-CM | POA: Diagnosis not present

## 2012-09-08 DIAGNOSIS — E785 Hyperlipidemia, unspecified: Secondary | ICD-10-CM | POA: Diagnosis not present

## 2012-09-08 DIAGNOSIS — R404 Transient alteration of awareness: Secondary | ICD-10-CM | POA: Diagnosis not present

## 2012-09-08 DIAGNOSIS — I6340 Cerebral infarction due to embolism of unspecified cerebral artery: Principal | ICD-10-CM | POA: Diagnosis present

## 2012-09-08 DIAGNOSIS — R531 Weakness: Secondary | ICD-10-CM

## 2012-09-08 DIAGNOSIS — Z7902 Long term (current) use of antithrombotics/antiplatelets: Secondary | ICD-10-CM

## 2012-09-08 DIAGNOSIS — W19XXXA Unspecified fall, initial encounter: Secondary | ICD-10-CM | POA: Diagnosis not present

## 2012-09-08 DIAGNOSIS — I517 Cardiomegaly: Secondary | ICD-10-CM | POA: Diagnosis not present

## 2012-09-08 DIAGNOSIS — G459 Transient cerebral ischemic attack, unspecified: Secondary | ICD-10-CM | POA: Diagnosis not present

## 2012-09-08 DIAGNOSIS — R29898 Other symptoms and signs involving the musculoskeletal system: Secondary | ICD-10-CM | POA: Diagnosis not present

## 2012-09-08 DIAGNOSIS — I639 Cerebral infarction, unspecified: Secondary | ICD-10-CM | POA: Diagnosis present

## 2012-09-08 DIAGNOSIS — K219 Gastro-esophageal reflux disease without esophagitis: Secondary | ICD-10-CM | POA: Diagnosis present

## 2012-09-08 DIAGNOSIS — R112 Nausea with vomiting, unspecified: Secondary | ICD-10-CM

## 2012-09-08 DIAGNOSIS — G8190 Hemiplegia, unspecified affecting unspecified side: Secondary | ICD-10-CM | POA: Diagnosis not present

## 2012-09-08 DIAGNOSIS — Z79899 Other long term (current) drug therapy: Secondary | ICD-10-CM

## 2012-09-08 DIAGNOSIS — I6350 Cerebral infarction due to unspecified occlusion or stenosis of unspecified cerebral artery: Secondary | ICD-10-CM | POA: Diagnosis not present

## 2012-09-08 DIAGNOSIS — M810 Age-related osteoporosis without current pathological fracture: Secondary | ICD-10-CM | POA: Diagnosis not present

## 2012-09-08 LAB — CBC
HCT: 39 % (ref 36.0–46.0)
Platelets: 158 10*3/uL (ref 150–400)
RBC: 4.21 MIL/uL (ref 3.87–5.11)
RDW: 13.4 % (ref 11.5–15.5)
WBC: 6.8 10*3/uL (ref 4.0–10.5)

## 2012-09-08 LAB — GLUCOSE, CAPILLARY: Glucose-Capillary: 124 mg/dL — ABNORMAL HIGH (ref 70–99)

## 2012-09-08 LAB — COMPREHENSIVE METABOLIC PANEL
ALT: 17 U/L (ref 0–35)
AST: 17 U/L (ref 0–37)
CO2: 24 mEq/L (ref 19–32)
Chloride: 104 mEq/L (ref 96–112)
GFR calc non Af Amer: 37 mL/min — ABNORMAL LOW (ref >90)
Glucose, Bld: 127 mg/dL — ABNORMAL HIGH (ref 70–99)
Sodium: 137 mEq/L (ref 135–145)
Total Bilirubin: 0.2 mg/dL — ABNORMAL LOW (ref 0.3–1.2)

## 2012-09-08 LAB — POCT I-STAT, CHEM 8
Calcium, Ion: 1.14 mmol/L (ref 1.13–1.30)
Creatinine, Ser: 1.3 mg/dL — ABNORMAL HIGH (ref 0.50–1.10)
Glucose, Bld: 129 mg/dL — ABNORMAL HIGH (ref 70–99)
Hemoglobin: 13.9 g/dL (ref 12.0–15.0)
TCO2: 23 mmol/L (ref 0–100)

## 2012-09-08 LAB — DIFFERENTIAL
Basophils Absolute: 0 10*3/uL (ref 0.0–0.1)
Lymphocytes Relative: 34 % (ref 12–46)
Lymphs Abs: 2.3 10*3/uL (ref 0.7–4.0)
Monocytes Absolute: 0.7 10*3/uL (ref 0.1–1.0)
Neutro Abs: 3.4 10*3/uL (ref 1.7–7.7)

## 2012-09-08 LAB — APTT: aPTT: 33 seconds (ref 24–37)

## 2012-09-08 MED ORDER — HYDROMORPHONE HCL PF 1 MG/ML IJ SOLN: 0.5000 mg | INTRAMUSCULAR | Status: DC | PRN

## 2012-09-08 MED ORDER — DONEPEZIL HCL 5 MG PO TABS
5.0000 mg | ORAL_TABLET | Freq: Every day | ORAL | Status: DC
  Administered 2012-09-09 – 2012-09-11 (×4): 5 mg via ORAL
  Filled 2012-09-08 (×6): qty 1

## 2012-09-08 MED ORDER — ATORVASTATIN CALCIUM 80 MG PO TABS
80.0000 mg | ORAL_TABLET | Freq: Every day | ORAL | Status: DC
  Administered 2012-09-09 – 2012-09-11 (×4): 80 mg via ORAL
  Filled 2012-09-08 (×5): qty 1

## 2012-09-08 MED ORDER — INSULIN ASPART 100 UNIT/ML ~~LOC~~ SOLN: 0.0000 [IU] | Freq: Every day | SUBCUTANEOUS | Status: DC

## 2012-09-08 MED ORDER — VITAMIN B-12 1000 MCG PO TABS
1000.0000 ug | ORAL_TABLET | Freq: Every day | ORAL | Status: DC
  Administered 2012-09-09 – 2012-09-12 (×4): 1000 ug via ORAL
  Filled 2012-09-08 (×5): qty 1

## 2012-09-08 MED ORDER — VITAMIN D 1000 UNITS PO TABS
5000.0000 [IU] | ORAL_TABLET | Freq: Every day | ORAL | Status: DC
  Administered 2012-09-09 – 2012-09-12 (×4): 5000 [IU] via ORAL
  Filled 2012-09-08 (×4): qty 5

## 2012-09-08 MED ORDER — FOLIC ACID 1 MG PO TABS
1.0000 mg | ORAL_TABLET | Freq: Two times a day (BID) | ORAL | Status: DC
  Administered 2012-09-09 – 2012-09-12 (×8): 1 mg via ORAL
  Filled 2012-09-08 (×9): qty 1

## 2012-09-08 MED ORDER — INSULIN ASPART 100 UNIT/ML ~~LOC~~ SOLN: 0.0000 [IU] | Freq: Three times a day (TID) | SUBCUTANEOUS | Status: DC

## 2012-09-08 MED ORDER — ENOXAPARIN SODIUM 40 MG/0.4ML ~~LOC~~ SOLN
40.0000 mg | SUBCUTANEOUS | Status: DC
  Administered 2012-09-09 – 2012-09-11 (×4): 40 mg via SUBCUTANEOUS
  Filled 2012-09-08 (×5): qty 0.4

## 2012-09-08 MED ORDER — PREDNISONE 1 MG PO TABS: 1.0000 mg | ORAL_TABLET | Freq: Every day | ORAL | Status: DC

## 2012-09-08 MED ORDER — ASPIRIN 325 MG PO TABS
325.0000 mg | ORAL_TABLET | Freq: Every day | ORAL | Status: DC
  Administered 2012-09-09: 325 mg via ORAL
  Filled 2012-09-08: qty 1

## 2012-09-08 MED ORDER — GLIMEPIRIDE 2 MG PO TABS
2.0000 mg | ORAL_TABLET | Freq: Every day | ORAL | Status: DC
  Administered 2012-09-09 – 2012-09-12 (×4): 2 mg via ORAL
  Filled 2012-09-08 (×5): qty 1

## 2012-09-08 MED ORDER — SERTRALINE HCL 100 MG PO TABS
200.0000 mg | ORAL_TABLET | Freq: Every day | ORAL | Status: DC
  Administered 2012-09-09 – 2012-09-12 (×4): 200 mg via ORAL
  Filled 2012-09-08 (×4): qty 2

## 2012-09-08 MED ORDER — ACETAMINOPHEN 325 MG PO TABS
650.0000 mg | ORAL_TABLET | Freq: Once | ORAL | Status: AC
  Administered 2012-09-08: 650 mg via ORAL
  Filled 2012-09-08: qty 2

## 2012-09-08 MED ORDER — SODIUM CHLORIDE 0.9 % IV SOLN
INTRAVENOUS | Status: DC
  Administered 2012-09-08: 20:00:00 via INTRAVENOUS

## 2012-09-08 MED ORDER — FLUTICASONE-SALMETEROL 100-50 MCG/DOSE IN AEPB
1.0000 | INHALATION_SPRAY | Freq: Two times a day (BID) | RESPIRATORY_TRACT | Status: DC
  Administered 2012-09-09 – 2012-09-12 (×7): 1 via RESPIRATORY_TRACT
  Filled 2012-09-08: qty 14

## 2012-09-08 MED ORDER — LEVOTHYROXINE SODIUM 100 MCG PO TABS
100.0000 ug | ORAL_TABLET | Freq: Every day | ORAL | Status: DC
  Administered 2012-09-09 – 2012-09-12 (×4): 100 ug via ORAL
  Filled 2012-09-08 (×5): qty 1

## 2012-09-08 NOTE — Code Documentation (Signed)
78 year old female seen in ED as Code Stroke.  Code stroke was called in the field by [facility] at 1821.  Patient arrival at 1836 - met at bridge by stroke team - EDP exam at 1837 - to CT scan 1841 - neurologist arrival at 1825.  LSW by her daughter at 24.   Patient states she had PT today with [facility] nurse and did well. She drove herself to [employer] to eat at 1600 then drove home .  Arrived at home around 1715 - she noticed her left hand was not working properly because she could not open the door.  At the same time she noted her left leg was weak.  Her neighbor came to her assistance and EMS was called.  On arrival she was alert talking and a little confused - she does take Aricept per hx.  Her weakness had resolved - her NIHSS was 2 - left facial droop and left sensation loss.  NS bolus was started - BP 131/64.  SR on monitor.  Reports she had a small right cerebellar stroke in  Feb 2014.  Dr. Cyril Mourning at bedside.  Handoff to [employer].  Patient with improving sx but not at baseline - 3 hour tPA window up at 8 pm - RN aware.

## 2012-09-08 NOTE — ED Notes (Signed)
Family (son & daughter) at bedside. Daughter reports that the pt called her around 1800 saying "I think I'm having another stroke." Daughter called EMS. Patient reported to daughter that she was driving around 0272 and noticed left sided weakness. Was unable to get out of the car by herself. Her neighbor helped her of the car and into her house around 1730.

## 2012-09-08 NOTE — ED Notes (Signed)
Patient transported to MRI 

## 2012-09-08 NOTE — H&P (Signed)
[facility] History and Physical  [name] NFA:[id number] DOB: 1934/12/02 DOA: 09/08/2012  Referring physician: EDP PCP: Gwen Pounds, MD  Specialists:   Chief Complaint:  Left Sided Weakness  HPI: [name] is a 78 y.o. female with a history of a Cerebellar CVA in 03/2012 and history of TIAs with DM2, and Hypertension and Hyperlipidemia who presents with complaints of sudden onset of weakness of Headache, weakness of her left side and nausea and difficulty walking at 5 pm.  She also had nausea and vomiting  X 1.   She denies having any syncope or seizure activity.  She presented to the ED as a Code Stroke and was evaluated by Neurology.  Her symptoms were resolving and resolved and a CT scan of the Head revealed non-acute findings so she was referred for admission for a TIA.      Review of Systems: The patient denies anorexia, fever, chills, weight loss, vision loss, diplopia, dizziness, decreased hearing, rhinitis, hoarseness, chest pain, syncope, dyspnea on exertion, peripheral edema, balance deficits, cough, hemoptysis, abdominal pain, diarrhea, constipation, hematemesis, melena, hematochezia, severe indigestion/heartburn, dysuria, hematuria, incontinence, muscle weakness, suspicious skin lesions, transient blindness, difficulty walking, depression, unusual weight change, abnormal bleeding, enlarged lymph nodes, angioedema, and breast masses.    Past Medical History  Diagnosis Date  . Hyperlipidemia   . Hypertension   . Obstructive sleep apnea   . Bifascicular block   . Diabetes mellitus   . Thyroid disease   . Hypothyroidism   . Gastric ulcer   . Squamous cell skin cancer   . Shortness of breath   . Stroke 03/31/2012  . GERD (gastroesophageal reflux disease)   . Kidney stones     HX OF  . Arthritis     Past Surgical History  Procedure Laterality Date  . Total knee arthroplasty      Left knee  . Tonsillectomy    . Appendectomy    . Hernia repair    .  Rotator cuff repair      RT SHOULDER  . Wrist fracture surgery      Prior to Admission medications   Medication Sig Start Date End Date Taking? Authorizing Provider  acetaminophen (TYLENOL) 325 MG tablet Take 2 tablets (650 mg total) by mouth every 4 (four) hours as needed. 04/04/12  Yes Gwen Pounds, MD  aspirin 325 MG tablet Take 1 tablet (325 mg total) by mouth daily. 04/10/12  Yes Daniel J Angiulli, PA-C  benazepril (LOTENSIN) 40 MG tablet Take 40 mg by mouth daily.   Yes Historical Provider, MD  Cholecalciferol 5000 UNITS capsule Take 1 capsule (5,000 Units total) by mouth daily. 04/10/12  Yes Daniel J Angiulli, PA-C  donepezil (ARICEPT) 5 MG tablet Take 1 tablet (5 mg total) by mouth at bedtime. 04/10/12  Yes Daniel J Angiulli, PA-C  Fluticasone-Salmeterol (ADVAIR DISKUS IN) Inhale 1 tablet into the lungs as needed (shortness of breath).   Yes Historical Provider, MD  folic acid (FOLVITE) 1 MG tablet Take 1 tablet (1 mg total) by mouth 2 (two) times daily. 04/10/12  Yes Daniel J Angiulli, PA-C  glimepiride (AMARYL) 2 MG tablet Take 1 tablet (2 mg total) by mouth daily with breakfast. 04/10/12  Yes Mcarthur Rossetti Angiulli, PA-C  levothyroxine (SYNTHROID, LEVOTHROID) 100 MCG tablet Take 1 tablet (100 mcg total) by mouth daily. 04/10/12  Yes Daniel J Angiulli, PA-C  predniSONE (DELTASONE) 1 MG tablet Take 1 mg by mouth daily.   Yes Historical Provider, MD  rosuvastatin (CRESTOR) 40 MG tablet Take 40 mg by mouth daily.   Yes Historical Provider, MD  sertraline (ZOLOFT) 100 MG tablet Take 200 mg by mouth daily.   Yes Historical Provider, MD  vitamin B-12 (CYANOCOBALAMIN) 1000 MCG tablet Take 1 tablet (1,000 mcg total) by mouth daily. 04/10/12  Yes Daniel J Angiulli, PA-C    Allergies  Allergen Reactions  . Adhesive (Tape)     Pulls off skin    Social History:  reports that she quit smoking about 7 years ago. She has never used smokeless tobacco. She reports that  drinks alcohol. She reports that she  does not use illicit drugs.     Family History  Problem Relation Age of Onset  . Heart failure Mother      Physical Exam:  GEN:  Pleasant Obese  78 y.o.  Caucasian female  examined  and in no acute distress; cooperative with exam Filed Vitals:   09/08/12 1930 09/08/12 1945 09/08/12 2000 09/08/12 2010  BP: 100/58 116/65 102/47   Pulse: 71 73 74   Temp:    98.1 F (36.7 C)  TempSrc:      Resp: 20 22 22    SpO2: 96% 97% 95%    Blood pressure 102/47, pulse 74, temperature 98.1 F (36.7 C), temperature source Oral, resp. rate 22, SpO2 95.00%. PSYCH: SHe is alert and oriented x4; does not appear anxious does not appear depressed; affect is normal HEENT: Normocephalic and Atraumatic, Mucous membranes pink; PERRLA; EOM intact; Fundi:  Benign;  No scleral icterus, Nares: Patent, Oropharynx: Clear, Poor Dentition, Neck:  FROM, no cervical lymphadenopathy nor thyromegaly or carotid bruit; no JVD; Breasts:: Not examined CHEST WALL: No tenderness CHEST: Normal respiration, clear to auscultation bilaterally HEART: Regular rate and rhythm; no murmurs rubs or gallops BACK: No kyphosis or scoliosis; no CVA tenderness ABDOMEN: Positive Bowel Sounds, Obese, soft non-tender; no masses, no organomegaly, no pannus; no intertriginous candida. Rectal Exam: Not done EXTREMITIES: No cyanosis, clubbing or edema; no ulcerations. Genitalia: not examined PULSES: 2+ and symmetric SKIN: Normal hydration no rash or ulceration CNS: Cranial nerves 2-12 grossly intact;    No pronator drifting, Able to move All 4 extremities,  No focal motor or sensory deficits.        Labs on Admission:  Basic Metabolic Panel:  Recent Labs Lab 09/08/12 1845 09/08/12 1851  NA 137 140  K 4.5 4.5  CL 104 106  CO2 24  --   GLUCOSE 127* 129*  BUN 28* 27*  CREATININE 1.35* 1.30*  CALCIUM 9.1  --    Liver Function Tests:  Recent Labs Lab 09/08/12 1845  AST 17  ALT 17  ALKPHOS 62  BILITOT 0.2*  PROT 6.9  ALBUMIN  3.7   No results found for this basename: LIPASE, AMYLASE,  in the last 168 hours No results found for this basename: AMMONIA,  in the last 168 hours CBC:  Recent Labs Lab 09/08/12 1845 09/08/12 1851  WBC 6.8  --   NEUTROABS 3.4  --   HGB 13.1 13.9  HCT 39.0 41.0  MCV 92.6  --   PLT 158  --    Cardiac Enzymes:  Recent Labs Lab 09/08/12 1847  TROPONINI <0.30    BNP (last 3 results) No results found for this basename: PROBNP,  in the last 8760 hours CBG:  Recent Labs Lab 09/08/12 1853  GLUCAP 124*    Radiological Exams on Admission: Ct Head (brain) Wo Contrast  09/08/2012   *  RADIOLOGY REPORT*  Clinical Data: Code stroke, left foot paralysis  CT HEAD WITHOUT CONTRAST  Technique:  Contiguous axial images were obtained from the base of the skull through the vertex without contrast.  Comparison: Brain MRA 04/01/2012, most recent head CT 03/27/2009  Findings: Cortical volume loss noted with proportional ventricular prominence.  Periventricular white matter hypodensity likely indicates small vessel ischemic change.  No acute hemorrhage, acute infarction, or mass lesion is identified.  No midline shift.  No skull fracture.  IMPRESSION: No acute intracranial finding. These results were called by telephone on 09/08/2012 at 6:55 p.m. to Dr. Hyacinth Meeker, who verbally acknowledged these results.   Original Report Authenticated By: Christiana Pellant, M.D.     Assessment/Plan Principal Problem:   TIA (transient ischemic attack) Active Problems:   Hypertension   Hyperlipidemia   Diabetes mellitus   Nausea and vomiting   Polymyalgia rheumatica    1.   TIA- TIA workup, MRI/MRA of Brain, Neuro checks,  Continue full dose ASA therapy, and Check Fasting Lipids.     2.    Hypertension-   Continue Benazapril Rx, and monitor BPs.     3.    Hyperlipidemia- Check Fasting Lipids in AM, continue Rouvastatin Rx adjust PRN.    4.    Diabetes Mellitis-  Continue Amaryl,   SSI coverage PRN, check  HbA1C.     5.    Nausea and Vomiting-  PRN IV Zofran, Monitor .     6.    PMR-  Previously on prednisone Rx, not currently.        Code Status:      FULL CODE Family Communication:   Daughter at Bedside Disposition Plan:    Return to Home on Discharge  Time spent:  67  Minutes  Ron Parker [facility] Pager [phone]  If 7PM-7AM, please contact night-coverage [email or web address] Password [facility] 09/08/2012, 8:44 PM

## 2012-09-08 NOTE — ED Provider Notes (Signed)
History    CSN: [id number] Arrival date & time 09/08/12  1836  First MD Initiated Contact with Patient 09/08/12 1842     Chief Complaint  Patient presents with  . Code Stroke   (Consider location/radiation/quality/duration/timing/severity/associated sxs/prior Treatment) HPI Comments: Pt w/ hx of TIA 03/31/12 now w/ acute onset left sided weakness. States 530pm acute onset LUE and LLE weakness. Denies HA, blurred vision, diplopia, confusion or dysarthria. Called EMS, glucose normal. Transported to ED. Code stroke initiated. Sx resolved by time of arrival. Denies trauma, or infection.   Patient is a 78 y.o. female presenting with general illness. The history is provided by the patient. No language interpreter was used.  Illness Location:  CNS Quality:  Weakness Severity:  Severe Onset quality:  Sudden Timing:  Constant Progression:  Resolved Chronicity:  New Associated symptoms: no abdominal pain, no chest pain, no congestion, no cough, no diarrhea, no fever, no headaches, no nausea, no rash, no shortness of breath, no sore throat and no vomiting    Past Medical History  Diagnosis Date  . Hyperlipidemia   . Hypertension   . Obstructive sleep apnea   . Bifascicular block   . Diabetes mellitus   . Thyroid disease   . Hypothyroidism   . Gastric ulcer   . Squamous cell skin cancer   . Shortness of breath   . Stroke 03/31/2012  . GERD (gastroesophageal reflux disease)   . Kidney stones     HX OF  . Arthritis    Past Surgical History  Procedure Laterality Date  . Total knee arthroplasty      Left knee  . Tonsillectomy    . Appendectomy    . Hernia repair    . Rotator cuff repair      RT SHOULDER  . Wrist fracture surgery     Family History  Problem Relation Age of Onset  . Heart failure Mother    History  Substance Use Topics  . Smoking status: Former Smoker    Quit date: 11/02/2004  . Smokeless tobacco: Never Used  . Alcohol Use: Yes     Comment: seldom   OB  History   Grav Para Term Preterm Abortions TAB SAB Ect Mult Living                 Review of Systems  Constitutional: Negative for fever and chills.  HENT: Negative for congestion and sore throat.   Respiratory: Negative for cough and shortness of breath.   Cardiovascular: Negative for chest pain and leg swelling.  Gastrointestinal: Negative for nausea, vomiting, abdominal pain, diarrhea and constipation.  Genitourinary: Negative for dysuria and frequency.  Skin: Negative for color change and rash.  Neurological: Positive for weakness. Negative for dizziness and headaches.  Psychiatric/Behavioral: Negative for confusion and agitation.  All other systems reviewed and are negative.    Allergies  Adhesive  Home Medications   Current Outpatient Rx  Name  Route  Sig  Dispense  Refill  . acetaminophen (TYLENOL) 325 MG tablet   Oral   Take 2 tablets (650 mg total) by mouth every 4 (four) hours as needed.         Marland Kitchen aspirin 325 MG tablet   Oral   Take 1 tablet (325 mg total) by mouth daily.         . benazepril (LOTENSIN) 20 MG tablet   Oral   Take 1 tablet (20 mg total) by mouth daily.   30 tablet  1   . Cholecalciferol 5000 UNITS capsule   Oral   Take 1 capsule (5,000 Units total) by mouth daily.   30 capsule   1   . donepezil (ARICEPT) 5 MG tablet   Oral   Take 1 tablet (5 mg total) by mouth at bedtime.   30 tablet   1   . folic acid (FOLVITE) 1 MG tablet   Oral   Take 1 tablet (1 mg total) by mouth 2 (two) times daily.   60 tablet   1   . glimepiride (AMARYL) 2 MG tablet   Oral   Take 1 tablet (2 mg total) by mouth daily with breakfast.   30 tablet   1   . levothyroxine (SYNTHROID, LEVOTHROID) 100 MCG tablet   Oral   Take 1 tablet (100 mcg total) by mouth daily.   30 tablet   1   . predniSONE (DELTASONE) 1 MG tablet   Oral   Take 1 mg by mouth daily.         . rosuvastatin (CRESTOR) 10 MG tablet   Oral   Take 2 tablets (20 mg total) by  mouth daily.         . sertraline (ZOLOFT) 100 MG tablet   Oral   Take 1 tablet (100 mg total) by mouth daily.   30 tablet   1   . vitamin B-12 (CYANOCOBALAMIN) 1000 MCG tablet   Oral   Take 1 tablet (1,000 mcg total) by mouth daily.   30 tablet   1    BP 121/61  Temp(Src) 97.9 F (36.6 C) (Oral)  Resp 27  SpO2 97% Physical Exam  Vitals reviewed. Constitutional: She is oriented to person, place, and time. She appears well-developed and well-nourished. No distress.  HENT:  Head: Normocephalic and atraumatic.  Eyes: EOM are normal. Pupils are equal, round, and reactive to light.  Neck: Normal range of motion. Neck supple.  Cardiovascular: Normal rate and regular rhythm.   Pulmonary/Chest: Effort normal and breath sounds normal. No respiratory distress.  Abdominal: Soft. She exhibits no distension.  Musculoskeletal: Normal range of motion. She exhibits no edema.  Neurological: She is alert and oriented to person, place, and time. She has normal strength. No cranial nerve deficit or sensory deficit. Coordination normal. GCS eye subscore is 4. GCS verbal subscore is 5. GCS motor subscore is 6.  Skin: Skin is warm and dry.  Psychiatric: She has a normal mood and affect. Her behavior is normal.    ED Course  Procedures (including critical care time) Results for orders placed during the hospital encounter of 09/08/12  [facility]      Result Value Range   Prothrombin Time 12.6  11.6 - 15.2 seconds   INR 0.96  0.00 - 1.49  APTT      Result Value Range   aPTT 33  24 - 37 seconds  CBC      Result Value Range   WBC 6.8  4.0 - 10.5 K/uL   RBC 4.21  3.87 - 5.11 MIL/uL   Hemoglobin 13.1  12.0 - 15.0 g/dL   HCT 45.4  09.8 - 11.9 %   MCV 92.6  78.0 - 100.0 fL   MCH 31.1  26.0 - 34.0 pg   MCHC 33.6  30.0 - 36.0 g/dL   RDW 14.7  82.9 - 56.2 %   Platelets 158  150 - 400 K/uL  DIFFERENTIAL      Result Value Range  Neutrophils Relative % 50  43 - 77 %   Neutro Abs 3.4  1.7 -  7.7 K/uL   Lymphocytes Relative 34  12 - 46 %   Lymphs Abs 2.3  0.7 - 4.0 K/uL   Monocytes Relative 11  3 - 12 %   Monocytes Absolute 0.7  0.1 - 1.0 K/uL   Eosinophils Relative 5  0 - 5 %   Eosinophils Absolute 0.3  0.0 - 0.7 K/uL   Basophils Relative 1  0 - 1 %   Basophils Absolute 0.0  0.0 - 0.1 K/uL  COMPREHENSIVE METABOLIC PANEL      Result Value Range   Sodium 137  135 - 145 mEq/L   Potassium 4.5  3.5 - 5.1 mEq/L   Chloride 104  96 - 112 mEq/L   CO2 24  19 - 32 mEq/L   Glucose, Bld 127 (*) 70 - 99 mg/dL   BUN 28 (*) 6 - 23 mg/dL   Creatinine, Ser 8.11 (*) 0.50 - 1.10 mg/dL   Calcium 9.1  8.4 - 91.4 mg/dL   Total Protein 6.9  6.0 - 8.3 g/dL   Albumin 3.7  3.5 - 5.2 g/dL   AST 17  0 - 37 U/L   ALT 17  0 - 35 U/L   Alkaline Phosphatase 62  39 - 117 U/L   Total Bilirubin 0.2 (*) 0.3 - 1.2 mg/dL   GFR calc non Af Amer 37 (*) >90 mL/min   GFR calc Af Amer 43 (*) >90 mL/min  TROPONIN I      Result Value Range   Troponin I <0.30  <0.30 ng/mL  GLUCOSE, CAPILLARY      Result Value Range   Glucose-Capillary 124 (*) 70 - 99 mg/dL  POCT I-STAT, CHEM 8      Result Value Range   Sodium 140  135 - 145 mEq/L   Potassium 4.5  3.5 - 5.1 mEq/L   Chloride 106  96 - 112 mEq/L   BUN 27 (*) 6 - 23 mg/dL   Creatinine, Ser 7.82 (*) 0.50 - 1.10 mg/dL   Glucose, Bld 956 (*) 70 - 99 mg/dL   Calcium, Ion 2.13  0.86 - 1.30 mmol/L   TCO2 23  0 - 100 mmol/L   Hemoglobin 13.9  12.0 - 15.0 g/dL   HCT 57.8  46.9 - 62.9 %    Ct Head (brain) Wo Contrast  09/08/2012   *RADIOLOGY REPORT*  Clinical Data: Code stroke, left foot paralysis  CT HEAD WITHOUT CONTRAST  Technique:  Contiguous axial images were obtained from the base of the skull through the vertex without contrast.  Comparison: Brain MRA 04/01/2012, most recent head CT 03/27/2009  Findings: Cortical volume loss noted with proportional ventricular prominence.  Periventricular white matter hypodensity likely indicates small vessel ischemic  change.  No acute hemorrhage, acute infarction, or mass lesion is identified.  No midline shift.  No skull fracture.  IMPRESSION: No acute intracranial finding. These results were called by telephone on 09/08/2012 at 6:55 p.m. to Dr. Hyacinth Meeker, who verbally acknowledged these results.   Original Report Authenticated By: Christiana Pellant, M.D.   No diagnosis found.  MDM  Exam as above, sx resolved, NIH 0, CT - NAICA, eval by neurology and cleared, recommend admit for TIA work up. Labs unremarkable. Glucose normal. D/w internal medicine and admit in stable condition. Given ASA 325mg  and tylenol for HA. No TPA given 2/2 resolved sx.   I have personally reviewed  labs and imaging and considered in my MDM. Case d/w Dr Radford Pax  1. Unilateral weakness      Audelia Hives, MD 09/09/12 [phone]

## 2012-09-08 NOTE — Progress Notes (Signed)
Referring Physician: Dr. Radford Pax    Chief Complaint: left sided weakness  HPI: [name] is an 78 y.o. female who was LKW by daughter at 21 on 09/08/2012. Patient states that she drove home and at 1715 she tried to get out of her car and her arm would not open the door. She states that it when she started having problems. EMS was called. She states that she was fine prior to that moment.  Patient states she takes a baby aspirin at home. Records indicate that she takes a full aspirin. Of note, she is also on aricept. She has history of stroke and was admitted in feb 2014 with right cerebellar stroke. Echo and carotids unremarkable at that time.  Date last known well: Date: 09/08/2012 Time last known well: Time: 14:00 NIHSS=2 (left arm sensory deficit, mild left facial droop)  tPA Given: No: resolving symptoms  Past Medical History  Diagnosis Date  . Hyperlipidemia   . Hypertension   . Obstructive sleep apnea   . Bifascicular block   . Diabetes mellitus   . Thyroid disease   . Hypothyroidism   . Gastric ulcer   . Squamous cell skin cancer   . Shortness of breath   . Stroke 03/31/2012  . GERD (gastroesophageal reflux disease)   . Kidney stones     HX OF  . Arthritis     Past Surgical History  Procedure Laterality Date  . Total knee arthroplasty      Left knee  . Tonsillectomy    . Appendectomy    . Hernia repair    . Rotator cuff repair      RT SHOULDER  . Wrist fracture surgery      Family History  Problem Relation Age of Onset  . Heart failure Mother    Social History:  reports that she quit smoking about 7 years ago. She has never used smokeless tobacco. She reports that  drinks alcohol. She reports that she does not use illicit drugs.  Allergies:  Allergies  Allergen Reactions  . Adhesive (Tape)     Pulls off skin    No current facility-administered medications for this encounter.   Current Outpatient Prescriptions  Medication Sig Dispense Refill  .  acetaminophen (TYLENOL) 325 MG tablet Take 2 tablets (650 mg total) by mouth every 4 (four) hours as needed.      Marland Kitchen aspirin 325 MG tablet Take 1 tablet (325 mg total) by mouth daily.      . benazepril (LOTENSIN) 20 MG tablet Take 1 tablet (20 mg total) by mouth daily.  30 tablet  1  . Cholecalciferol 5000 UNITS capsule Take 1 capsule (5,000 Units total) by mouth daily.  30 capsule  1  . donepezil (ARICEPT) 5 MG tablet Take 1 tablet (5 mg total) by mouth at bedtime.  30 tablet  1  . folic acid (FOLVITE) 1 MG tablet Take 1 tablet (1 mg total) by mouth 2 (two) times daily.  60 tablet  1  . glimepiride (AMARYL) 2 MG tablet Take 1 tablet (2 mg total) by mouth daily with breakfast.  30 tablet  1  . levothyroxine (SYNTHROID, LEVOTHROID) 100 MCG tablet Take 1 tablet (100 mcg total) by mouth daily.  30 tablet  1  . predniSONE (DELTASONE) 1 MG tablet Take 1 mg by mouth daily.      . rosuvastatin (CRESTOR) 10 MG tablet Take 2 tablets (20 mg total) by mouth daily.      Marland Kitchen  sertraline (ZOLOFT) 100 MG tablet Take 1 tablet (100 mg total) by mouth daily.  30 tablet  1  . vitamin B-12 (CYANOCOBALAMIN) 1000 MCG tablet Take 1 tablet (1,000 mcg total) by mouth daily.  30 tablet  1     ROS: History obtained from the patient  General ROS: negative for - chills, fatigue, fever, night sweats, weight gain or weight loss Psychological ROS: negative for - behavioral disorder, hallucinations, memory difficulties, mood swings or suicidal ideation Ophthalmic ROS: negative for - blurry vision, double vision, eye pain or loss of vision ENT ROS: negative for - epistaxis, nasal discharge, oral lesions, sore throat, tinnitus or vertigo Allergy and Immunology ROS: negative for - hives or itchy/watery eyes Hematological and Lymphatic ROS: negative for - bleeding problems, bruising or swollen lymph nodes Endocrine ROS: negative for - galactorrhea, hair pattern changes, polydipsia/polyuria or temperature intolerance Respiratory  ROS: negative for - cough, hemoptysis, shortness of breath or wheezing Cardiovascular ROS: negative for - chest pain, dyspnea on exertion, edema or irregular heartbeat Gastrointestinal ROS: negative for - abdominal pain, diarrhea, hematemesis, nausea/vomiting or stool incontinence Genito-Urinary ROS: negative for - dysuria, hematuria, incontinence or urinary frequency/urgency Musculoskeletal ROS: negative for - joint swelling or muscular weakness Neurological ROS: as noted in HPI Dermatological ROS: negative for rash and skin lesion changes   Physical Examination: Blood pressure 121/61, temperature 97.9 F (36.6 C), temperature source Oral, resp. rate 27, SpO2 97.00%.  Neurologic Examination: Mental Status: Alert, oriented, thought content appropriate.  Speech fluent without evidence of aphasia.  Able to follow 3 step commands without difficulty. Cranial Nerves: II: visual fields grossly normal, pupils equal, round III,IV, VI: ptosis not present, extra-ocular motions intact bilaterally V,VII: smile asymmetric with minimal left facial droop, facial light touch sensation normal bilaterally VIII: hearing normal bilaterally IX,X: gag reflex present XI: trapezius strength/neck flexion strength normal bilaterally XII: tongue strength normal  Motor: Right : Upper extremity   5/5    Left:     Upper extremity   5/5  Lower extremity   5/5     Lower extremity   5/5 Tone and bulk:normal tone throughout; no atrophy noted Sensory: Pinprick and light touch intact throughout, with exception of decreased sensation of left upper extremity Cerebellar: normal finger-to-nose, normal heel-to-shin test    Results for orders placed during the hospital encounter of 09/08/12 (from the past 48 hour(s))  PROTIME-INR     Status: None   Collection Time    09/08/12  6:45 PM      Result Value Range   Prothrombin Time 12.6  11.6 - 15.2 seconds   INR 0.96  0.00 - 1.49  APTT     Status: None   Collection Time     09/08/12  6:45 PM      Result Value Range   aPTT 33  24 - 37 seconds  CBC     Status: None   Collection Time    09/08/12  6:45 PM      Result Value Range   WBC 6.8  4.0 - 10.5 K/uL   RBC 4.21  3.87 - 5.11 MIL/uL   Hemoglobin 13.1  12.0 - 15.0 g/dL   HCT 16.1  09.6 - 04.5 %   MCV 92.6  78.0 - 100.0 fL   MCH 31.1  26.0 - 34.0 pg   MCHC 33.6  30.0 - 36.0 g/dL   RDW 40.9  81.1 - 91.4 %   Platelets 158  150 - 400 K/uL  DIFFERENTIAL     Status: None   Collection Time    09/08/12  6:45 PM      Result Value Range   Neutrophils Relative % 50  43 - 77 %   Neutro Abs 3.4  1.7 - 7.7 K/uL   Lymphocytes Relative 34  12 - 46 %   Lymphs Abs 2.3  0.7 - 4.0 K/uL   Monocytes Relative 11  3 - 12 %   Monocytes Absolute 0.7  0.1 - 1.0 K/uL   Eosinophils Relative 5  0 - 5 %   Eosinophils Absolute 0.3  0.0 - 0.7 K/uL   Basophils Relative 1  0 - 1 %   Basophils Absolute 0.0  0.0 - 0.1 K/uL  POCT I-STAT, CHEM 8     Status: Abnormal   Collection Time    09/08/12  6:51 PM      Result Value Range   Sodium 140  135 - 145 mEq/L   Potassium 4.5  3.5 - 5.1 mEq/L   Chloride 106  96 - 112 mEq/L   BUN 27 (*) 6 - 23 mg/dL   Creatinine, Ser 1.61 (*) 0.50 - 1.10 mg/dL   Glucose, Bld 096 (*) 70 - 99 mg/dL   Calcium, Ion 0.45  4.09 - 1.30 mmol/L   TCO2 23  0 - 100 mmol/L   Hemoglobin 13.9  12.0 - 15.0 g/dL   HCT 81.1  91.4 - 78.2 %   Ct Head (brain) Wo Contrast  09/08/2012   *RADIOLOGY REPORT*  Clinical Data: Code stroke, left foot paralysis  CT HEAD WITHOUT CONTRAST  Technique:  Contiguous axial images were obtained from the base of the skull through the vertex without contrast.  Comparison: Brain MRA 04/01/2012, most recent head CT 03/27/2009  Findings: Cortical volume loss noted with proportional ventricular prominence.  Periventricular white matter hypodensity likely indicates small vessel ischemic change.  No acute hemorrhage, acute infarction, or mass lesion is identified.  No midline shift.  No skull  fracture.  IMPRESSION: No acute intracranial finding. These results were called by telephone on 09/08/2012 at 6:55 p.m. to Dr. Hyacinth Meeker, who verbally acknowledged these results.   Original Report Authenticated By: Christiana Pellant, M.D.    Assessment: 78 y.o. female with left hemiparesis, hemi-sensory loss and left facial droop which is rapidly improving ( NIHSS 2). Decided not to administer thrombolysis as she is out of the window for iv tpa and has rapidly improving symptoms. Currently on aspirin at home, will treat as aspirin failure. History of cerebellar stroke in Feb 2014. Will continue stroke workup.   Stroke Risk Factors - diabetes mellitus, hyperlipidemia, hypertension and previous stroke  Plan: 1. HgbA1c, fasting lipid panel 2. MRI, MRA  of the brain without contrast 3. PT consult, OT consult, Speech consult 4. Echocardiogram 5. Carotid dopplers 6. Prophylactic therapy-Antiplatelet med: Plavix - dose 75mg  daily 7. Risk factor modification 8. Telemetry monitoring 9. Frequent neuro checks  Gwendolyn Lima. Manson Passey, [facility], MBA, MHA [facility] Pager: [phone] 09/08/2012 7:11 PM    Patient seen and examined together with physician assistant and I concur with the assessment and plan.  Wyatt Portela, MD

## 2012-09-08 NOTE — ED Notes (Signed)
Admitting MD at bedside.

## 2012-09-08 NOTE — ED Notes (Signed)
Neurology at bedside.

## 2012-09-09 DIAGNOSIS — I6350 Cerebral infarction due to unspecified occlusion or stenosis of unspecified cerebral artery: Secondary | ICD-10-CM

## 2012-09-09 DIAGNOSIS — G459 Transient cerebral ischemic attack, unspecified: Secondary | ICD-10-CM

## 2012-09-09 DIAGNOSIS — E785 Hyperlipidemia, unspecified: Secondary | ICD-10-CM | POA: Diagnosis not present

## 2012-09-09 DIAGNOSIS — R5383 Other fatigue: Secondary | ICD-10-CM | POA: Diagnosis not present

## 2012-09-09 DIAGNOSIS — E119 Type 2 diabetes mellitus without complications: Secondary | ICD-10-CM | POA: Diagnosis not present

## 2012-09-09 LAB — LIPID PANEL
Cholesterol: 122 mg/dL (ref 0–200)
LDL Cholesterol: 53 mg/dL (ref 0–99)
VLDL: 22 mg/dL (ref 0–40)

## 2012-09-09 LAB — RAPID URINE DRUG SCREEN, HOSP PERFORMED
Barbiturates: NOT DETECTED
Benzodiazepines: NOT DETECTED
Cocaine: NOT DETECTED
Opiates: NOT DETECTED

## 2012-09-09 LAB — GLUCOSE, CAPILLARY
Glucose-Capillary: 58 mg/dL — ABNORMAL LOW (ref 70–99)
Glucose-Capillary: 83 mg/dL (ref 70–99)
Glucose-Capillary: 86 mg/dL (ref 70–99)

## 2012-09-09 MED ORDER — CLOPIDOGREL BISULFATE 75 MG PO TABS
75.0000 mg | ORAL_TABLET | Freq: Every day | ORAL | Status: DC
  Administered 2012-09-10 – 2012-09-12 (×3): 75 mg via ORAL
  Filled 2012-09-09 (×3): qty 1

## 2012-09-09 NOTE — ED Provider Notes (Signed)
I saw and evaluated the patient, reviewed the resident's note and I agree with the findings and plan.   .Face to face Exam:  General:  Awake HEENT:  Atraumatic Resp:  Normal effort Abd:  Nondistended Neuro:No focal weakness Lymph: No adenopathy   Nelia Shi, MD 09/09/12 [phone]

## 2012-09-09 NOTE — Progress Notes (Signed)
Stroke Team Progress Note  HISTORY [name] is a 78 y.o. female who was LKW by daughter at 71 on 09/08/2012. Patient stated that she drove home and at 1715 she tried to get out of her car and her arm would not open the door. EMS was called. She stated that she was fine prior to that moment.  Patient stated she takes a baby aspirin at home. Records indicate that she takes a full aspirin. Of note, she is also on aricept. She has history of stroke and was admitted in Feb 2014 with right cerebellar stroke. Echo and carotids unremarkable at that time.   Date last known well: Date: 09/08/2012  Time last known well: Time: 14:00  NIHSS=2 (left arm sensory deficit, mild left facial droop)  tPA Given: No: resolving symptoms   SUBJECTIVE The patient's daughter is present this morning. The patient has no complaints. She feels she is back to baseline.  OBJECTIVE Most recent Vital Signs: Filed Vitals:   09/09/12 0400 09/09/12 0600 09/09/12 0800 09/09/12 0839  BP: 147/78 120/49 138/78   Pulse: 95 65 86   Temp:   97.1 F (36.2 C)   TempSrc:   Oral   Resp: 20 20 18    Height:      Weight:      SpO2: 93% 93% 92% 94%   CBG (last 3)   Recent Labs  09/08/12 1853 09/08/12 2252 09/09/12 0739  GLUCAP 124* 132* 80    IV Fluid Intake:   . sodium chloride 75 mL/hr at 09/08/12 2013    MEDICATIONS  . aspirin  325 mg Oral Daily  . atorvastatin  80 mg Oral q1800  . cholecalciferol  5,000 Units Oral Daily  . donepezil  5 mg Oral QHS  . enoxaparin (LOVENOX) injection  40 mg Subcutaneous Q24H  . Fluticasone-Salmeterol  1 puff Inhalation BID  . folic acid  1 mg Oral BID  . glimepiride  2 mg Oral Q breakfast  . insulin aspart  0-5 Units Subcutaneous QHS  . insulin aspart  0-9 Units Subcutaneous TID WC  . levothyroxine  100 mcg Oral QAC breakfast  . sertraline  200 mg Oral Daily  . vitamin B-12  1,000 mcg Oral Daily   PRN:  HYDROmorphone (DILAUDID) injection  Diet:  Carb Control  Thin  liquids Activity:  Bathroom privileg with assistance DVT Prophylaxis:  Lovenox  CLINICALLY SIGNIFICANT STUDIES Basic Metabolic Panel:  Recent Labs Lab 09/08/12 1845 09/08/12 1851  NA 137 140  K 4.5 4.5  CL 104 106  CO2 24  --   GLUCOSE 127* 129*  BUN 28* 27*  CREATININE 1.35* 1.30*  CALCIUM 9.1  --    Liver Function Tests:  Recent Labs Lab 09/08/12 1845  AST 17  ALT 17  ALKPHOS 62  BILITOT 0.2*  PROT 6.9  ALBUMIN 3.7   CBC:  Recent Labs Lab 09/08/12 1845 09/08/12 1851  WBC 6.8  --   NEUTROABS 3.4  --   HGB 13.1 13.9  HCT 39.0 41.0  MCV 92.6  --   PLT 158  --    Coagulation:  Recent Labs Lab 09/08/12 1845  LABPROT 12.6  INR 0.96   Cardiac Enzymes:  Recent Labs Lab 09/08/12 1847  TROPONINI <0.30   Urinalysis: No results found for this basename: COLORURINE, APPERANCEUR, LABSPEC, PHURINE, GLUCOSEU, HGBUR, BILIRUBINUR, KETONESUR, PROTEINUR, UROBILINOGEN, NITRITE, LEUKOCYTESUR,  in the last 168 hours Lipid Panel    Component Value Date/Time   CHOL 122  09/09/2012 0711   TRIG 112 09/09/2012 0711   HDL 47 09/09/2012 0711   CHOLHDL 2.6 09/09/2012 0711   VLDL 22 09/09/2012 0711   LDLCALC 53 09/09/2012 0711   HgbA1C  Lab Results  Component Value Date   HGBA1C 5.5 09/08/2012    Urine Drug Screen:     Component Value Date/Time   LABOPIA NONE DETECTED 09/09/2012 0026   COCAINSCRNUR NONE DETECTED 09/09/2012 0026   LABBENZ NONE DETECTED 09/09/2012 0026   AMPHETMU NONE DETECTED 09/09/2012 0026   THCU NONE DETECTED 09/09/2012 0026   LABBARB NONE DETECTED 09/09/2012 0026    Alcohol Level: No results found for this basename: ETH,  in the last 168 hours  Ct Head (brain) Wo Contrast 09/08/2012  No acute intracranial finding.   Mri Brain Without Contrast 09/08/2012  1.  Small patchy acute infarcts in the right superior frontal gyrus, right MCA territory.  No mass effect or hemorrhage. 2.  Interval expected evolution of the right cerebellar infarct that occurred in  February. 3.  Stable brain otherwise. 4.  MRA findings are below.    MRA HEAD WITHOUT CONTRAST 09/08/12 1.  Stable anterior circulation since 04/01/2012.  No right MCA stenosis or major branch occlusion identified. 2.  Posterior circulation better visualized today.  Extensive basilar artery atherosclerosis but no hemodynamically significant stenosis suspected.  Bilateral fetal PCA origins.     2D Echocardiogram  - pending  Carotid Doppler  pending  CXR    EKG pending  Therapy Recommendations - pending  Physical Exam   General - pleasant 78 year old female in no acute distress. Heart - Regular rate and rhythm - no murmer noted Lungs - Clear to auscultation Abdomen - Soft - non tender Skin - Warm and dry  NEUROLOGIC:   MENTAL STATUS: awake, alert, oriented, language fluent,  follows simple commands.  CRANIAL NERVES: pupils equal and reactive to light,extraocular muscles intact, facial sensation and strength symmetric, uvula midlinec, tongue deviates to the left when protruded. MOTOR: normal bulk and tone, Strength -5 over 5 throughout. SENSORY: normal and symmetric to light touch  COORDINATION: finger-nose-finger normal    ASSESSMENT Ms. [name] is a 78 y.o. female presenting with left upper extremity paresis. TPA was not given secondary to rapidly resolving symptoms.  MRI confirms small patchy acute infarcts in the right superior frontal gyrus, right MCA territory as well as a previous right cerebellar infarct from February. Infarcts felt to be embolic of uncertain etiology.On aspirin 325 mg orally every day prior to admission. Now on aspirin 325 mg orally every day for secondary stroke prevention. Patient with resolution of deficits. Work up underway.   Previous right cerebellar infarct  Renal insufficiency  Hyperlipidemia - cholesterol 122 LDL 53 - Lipitor 80 mg daily prior to admission.  Hypertension history  Diabetes mellitus - hemoglobin A1c 5.5  Hospital day  # 1  TREATMENT/PLAN  Continue aspirin 325 mg orally every day for secondary stroke prevention. Consider change to Plavix. Await Dr. Pattricia Boss input.   Await 2-D echo and carotid Dopplers. Will probably need a TEE to evaluate for source of emboli.  Await PT and OT evaluations.  Delton See PA-C [facility] Pager [phone] 09/09/2012, 11:34 AM   I have personally obtained a history, examined the patient, evaluated imaging results, and formulated the assessment and plan of care. I agree with the above. Change aspirin to plavix 75mg  daily. Will need TEE on Monday, if TTE is negative.  Suanne Marker, MD  09/09/2012, 2:19 PM Certified in Neurology, Neurophysiology and Neuroimaging [facility] - Stroke Team  Please refer to [email or web address] for on-call Stroke MD

## 2012-09-09 NOTE — Evaluation (Signed)
Physical Therapy Evaluation Patient Details Name: [name] MRN: [id number] DOB: June 20, 1934 Today's Date: 09/09/2012 Time: 1340-1410 PT Time Calculation (min): 30 min  PT Assessment / Plan / Recommendation History of Present Illness  Pt presented with left sided weakness with MRI showing patchy acute right frontal gyrus infarcts  Clinical Impression  Pt and son both confirm pt is at baseline functional and mobility status with new deficit only that of some weakness of LUE. Pt able to perform visual tracking without difficulty, no deficits with sensation and gait with slight unsteadiness for which pt states she holds walls in the house and uses a cane when outside. Educated pt for need to use cane at all times as well as for CVA risk factors and symptoms. Pt reports she and son don't really cook and go out to eat however pt is receiving HHPT after fall grossly a week ago. Pt received therapy at [facility] and [facility] last feb after cerebellar CVA and state she was performing better at that time then currently. Recommend return to [facility] for balance deficits and LUE strengthening since pt is not truly homebound. No further acute needs which pt and son state they agree.     PT Assessment  All further PT needs can be met in the next venue of care    Follow Up Recommendations  Outpatient PT    Does the patient have the potential to tolerate intense rehabilitation      Barriers to Discharge        Equipment Recommendations  None recommended by PT    Recommendations for Other Services     Frequency      Precautions / Restrictions Precautions Precautions: Fall   Pertinent Vitals/Pain No pain HR 93      Mobility  Bed Mobility Bed Mobility: Supine to Sit;Sitting - Scoot to Edge of Bed Supine to Sit: 6: Modified independent (Device/Increase time);HOB flat Sitting - Scoot to Edge of Bed: 6: Modified independent (Device/Increase time) Transfers Transfers: Sit to Stand;Stand to Sit Sit to  Stand: 6: Modified independent (Device/Increase time);From bed;From toilet Stand to Sit: 6: Modified independent (Device/Increase time);To toilet;To chair/3-in-1 Ambulation/Gait Ambulation/Gait Assistance: 5: Supervision Ambulation Distance (Feet): 400 Feet Assistive device: None Ambulation/Gait Assistance Details: pt able to perform head turns with slightly unsteady gait throughout but not able to significantly change gait speed when cued Gait Pattern: Step-through pattern;Decreased stride length Gait velocity: decreased    Exercises     PT Diagnosis: Abnormality of gait  PT Problem List: Decreased balance PT Treatment Interventions:       PT Goals(Current goals can be found in the care plan section) Acute Rehab PT Goals PT Goal Formulation: No goals set, d/c therapy  Visit Information  Last PT Received On: 09/09/12 Assistance Needed: +1 History of Present Illness: Pt presented with left sided weakness with MRI showing patchy acute right frontal gyrus infarcts       Prior Functioning  Home Living Family/patient expects to be discharged to:: Private residence Living Arrangements: Children Available Help at Discharge: Family;Available PRN/intermittently Type of Home: House Home Access: Stairs to enter [employer] of Steps: 5 Entrance Stairs-Rails: Right;Left;Can reach both Home Layout: One level Home Equipment: Walker - 2 wheels;Cane - single point Additional Comments: son is returning to work soon and will no longer be available 24hrs Prior Function Level of Independence: Independent with assistive device(s) Communication Communication: No difficulties    Cognition  Cognition Arousal/Alertness: Awake/alert Behavior During Therapy: WFL for tasks assessed/performed Overall  Cognitive Status: Within Functional Limits for tasks assessed    Extremity/Trunk Assessment Upper Extremity Assessment Upper Extremity Assessment: LUE deficits/detail LUE Deficits /  Details: Left shoulder flexion and abduction 3+/5 Lower Extremity Assessment Lower Extremity Assessment: Overall WFL for tasks assessed Cervical / Trunk Assessment Cervical / Trunk Assessment: Normal   Balance High Level Balance High Level Balance Activites: Head turns High Level Balance Comments: pt able to perform without LOB as well as change of direction  End of Session PT - End of Session Equipment Utilized During Treatment: Gait belt Activity Tolerance: Patient tolerated treatment well Patient left: in chair;with call bell/phone within reach;with family/visitor present  GP     Toney Sang Beth 09/09/2012, 2:15 PM Delaney Meigs, PT [phone]

## 2012-09-09 NOTE — Progress Notes (Signed)
Utilization review complete 

## 2012-09-09 NOTE — Progress Notes (Signed)
*  PRELIMINARY RESULTS* Vascular Ultrasound Carotid Duplex (Doppler) has been completed.  The right internal carotid artery demonstrates elevated velocities suggestive of <40% stenosis. There is no evidence of hemodynamically significant left internal carotid artery stenosis >40%. Vertebral arteries are patent with antegrade flow.  09/09/2012 2:47 PM Gertie Fey, RVT, RDCS, RDMS

## 2012-09-09 NOTE — Progress Notes (Addendum)
[facility] PROGRESS NOTE  [name]:[id number] DOB: 1934/05/12 DOA: 09/08/2012 PCP: Gwen Pounds, MD  Assessment/Plan: Acute rt sided CVA  as seen on MRI brain with rt superior frontal gyrus infarct.  Left sided weakness now resolved. pending stroke w/up including 2D echo, carotids, PT/ OT eval, lipid panel and A1C. Patient on full dose of aspirin prior to symptoms and had recent stroke 5 months back. Will switch to plavix if neurology agree. Needs aggressive lifestyle and risk factor  modifications.  HTN  allow permissive BP  Hyperlipidemia  placed on lipitor. Check lipid panel  DM  on amaryl. Check A1C. continue SSI    Hypothyroidism  continue synthroid  OSA   on CPAP   DVT prophylaxis: sq lovenox   Code Status: full Family Communication: none at bedside Disposition Plan: home once stable and w/up completed   Consultants:  neurology  Procedures:  none  Antibiotics:  none  HPI/Subjective: Admission H&P reviewed. Patient has no further weakness  Objective: Filed Vitals:   09/09/12 0400 09/09/12 0600 09/09/12 0800 09/09/12 0839  BP: 147/78 120/49 138/78   Pulse: 95 65 86   Temp:   97.1 F (36.2 C)   TempSrc:   Oral   Resp: 20 20 18    Height:      Weight:      SpO2: 93% 93% 92% 94%    Intake/Output Summary (Last 24 hours) at 09/09/12 1128 Last data filed at 09/09/12 0942  Gross per 24 hour  Intake    360 ml  Output    926 ml  Net   -566 ml   Filed Weights   09/08/12 2136 09/08/12 2229  Weight: 93.4 kg (205 lb 14.6 oz) 99.292 kg (218 lb 14.4 oz)    Exam:  Gen: elderly female in NAD HEENT: no pallor, moist oral mucosa Chest: clear b/l  CVS: NS1&S2, no murmurs Abd: ND, NT, BS+ Ext: warm, no edema  CNS: AAOX3, no focal deficit Data Reviewed: Basic Metabolic Panel:  Recent Labs Lab 09/08/12 1845 09/08/12 1851  NA 137 140  K 4.5 4.5  CL 104 106  CO2 24  --   GLUCOSE 127* 129*  BUN 28* 27*  CREATININE 1.35*  1.30*  CALCIUM 9.1  --    Liver Function Tests:  Recent Labs Lab 09/08/12 1845  AST 17  ALT 17  ALKPHOS 62  BILITOT 0.2*  PROT 6.9  ALBUMIN 3.7   No results found for this basename: LIPASE, AMYLASE,  in the last 168 hours No results found for this basename: AMMONIA,  in the last 168 hours CBC:  Recent Labs Lab 09/08/12 1845 09/08/12 1851  WBC 6.8  --   NEUTROABS 3.4  --   HGB 13.1 13.9  HCT 39.0 41.0  MCV 92.6  --   PLT 158  --    Cardiac Enzymes:  Recent Labs Lab 09/08/12 1847  TROPONINI <0.30   BNP (last 3 results) No results found for this basename: PROBNP,  in the last 8760 hours CBG:  Recent Labs Lab 09/08/12 1853 09/08/12 2252 09/09/12 0739  GLUCAP 124* 132* 80    No results found for this or any previous visit (from the past 240 hour(s)).   Studies: Ct Head (brain) Wo Contrast  09/08/2012   *RADIOLOGY REPORT*  Clinical Data: Code stroke, left foot paralysis  CT HEAD WITHOUT CONTRAST  Technique:  Contiguous axial images were obtained from the base of the skull through the vertex without contrast.  Comparison: Brain MRA 04/01/2012, most recent head CT 03/27/2009  Findings: Cortical volume loss noted with proportional ventricular prominence.  Periventricular white matter hypodensity likely indicates small vessel ischemic change.  No acute hemorrhage, acute infarction, or mass lesion is identified.  No midline shift.  No skull fracture.  IMPRESSION: No acute intracranial finding. These results were called by telephone on 09/08/2012 at 6:55 p.m. to Dr. Hyacinth Meeker, who verbally acknowledged these results.   Original Report Authenticated By: Christiana Pellant, M.D.   Mri Brain Without Contrast  09/08/2012   *RADIOLOGY REPORT*  Clinical Data:  78 year old female Code stroke.  Left lower extremity symptoms. Difficulty walking beginning at 1700 hours. Nausea and vomiting.  Comparison: Head CT without contrast 09/08/2012.  Brain MRI/MRA 03/31/2012.  MRI HEAD WITHOUT  CONTRAST  Technique: Multiplanar, multiecho pulse sequences of the brain and surrounding structures were obtained according to standard protocol without intravenous contrast.  Findings: Several small up to 10 mm areas of restricted diffusion in the right superior frontal gyrus cortex and subcortical white matter (most pronounced on series 301 image 21).  No associated mass effect or hemorrhage.  Confluent T2 and FLAIR hyperintensity in this region as well as in the opposite hemisphere white matter not significantly changed from the prior study. Major intracranial vascular flow voids are stable.  Interval evolved right cerebellar infarcts which were acute in February.  Encephalomalacia.  Otherwise no significant interval change in gray and white matter signal. Stable ventricle size and configuration.  Negative pituitary, cervicomedullary junction, and visualized cervical spine.  Visualized orbit soft tissues are within normal limits.  Stable paranasal sinuses and mastoids.  Normal bone marrow signal. Negative scalp soft tissues.  IMPRESSION: 1.  Small patchy acute infarcts in the right superior frontal gyrus, right MCA territory.  No mass effect or hemorrhage. 2.  Interval expected evolution of the right cerebellar infarct that occurred in February. 3.  Stable brain otherwise. 4.  MRA findings are below.  MRA HEAD WITHOUT CONTRAST  Technique: Angiographic images of the Circle of Willis were obtained using MRA technique without  intravenous contrast.  Findings: Study is intermittently degraded by motion artifact despite repeated imaging attempts.  Stable antegrade flow in the posterior circulation with dominant distal right vertebral artery and left vertebral artery terminating PICA.  Stable right PICA.  Stable basilar artery with moderate irregularity.  No hemodynamically significant basilar stenosis suspected.  SCA origins are normal.  Bilateral fetal type PCA origins re-identified.  Bilateral PCA branches are  stable and within normal limits.  Stable antegrade flow in both ICA siphons no ICA siphon stenosis. Bilateral posterior communicating artery origins are within normal limits.  Stable carotid termini.  Stable and normal MCA and ACA origins.  Visualized ACA branches are stable within normal limits. Visualized left MCA branches are stable and within normal limits.  Right MCA M1 segment remains normal.  Right MCA trifurcation remains patent.  Right MCA branches are stable.  IMPRESSION: 1.  Stable anterior circulation since 04/01/2012.  No right MCA stenosis or major branch occlusion identified. 2.  Posterior circulation better visualized today.  Extensive basilar artery atherosclerosis but no hemodynamically significant stenosis suspected.  Bilateral fetal PCA origins.   Original Report Authenticated By: Erskine Speed, M.D.   Mr Mra Head/brain Wo Cm  09/08/2012   *RADIOLOGY REPORT*  Clinical Data:  78 year old female Code stroke.  Left lower extremity symptoms. Difficulty walking beginning at 1700 hours. Nausea and vomiting.  Comparison: Head CT without contrast 09/08/2012.  Brain MRI/MRA  03/31/2012.  MRI HEAD WITHOUT CONTRAST  Technique: Multiplanar, multiecho pulse sequences of the brain and surrounding structures were obtained according to standard protocol without intravenous contrast.  Findings: Several small up to 10 mm areas of restricted diffusion in the right superior frontal gyrus cortex and subcortical white matter (most pronounced on series 301 image 21).  No associated mass effect or hemorrhage.  Confluent T2 and FLAIR hyperintensity in this region as well as in the opposite hemisphere white matter not significantly changed from the prior study. Major intracranial vascular flow voids are stable.  Interval evolved right cerebellar infarcts which were acute in February.  Encephalomalacia.  Otherwise no significant interval change in gray and white matter signal. Stable ventricle size and configuration.   Negative pituitary, cervicomedullary junction, and visualized cervical spine.  Visualized orbit soft tissues are within normal limits.  Stable paranasal sinuses and mastoids.  Normal bone marrow signal. Negative scalp soft tissues.  IMPRESSION: 1.  Small patchy acute infarcts in the right superior frontal gyrus, right MCA territory.  No mass effect or hemorrhage. 2.  Interval expected evolution of the right cerebellar infarct that occurred in February. 3.  Stable brain otherwise. 4.  MRA findings are below.  MRA HEAD WITHOUT CONTRAST  Technique: Angiographic images of the Circle of Willis were obtained using MRA technique without  intravenous contrast.  Findings: Study is intermittently degraded by motion artifact despite repeated imaging attempts.  Stable antegrade flow in the posterior circulation with dominant distal right vertebral artery and left vertebral artery terminating PICA.  Stable right PICA.  Stable basilar artery with moderate irregularity.  No hemodynamically significant basilar stenosis suspected.  SCA origins are normal.  Bilateral fetal type PCA origins re-identified.  Bilateral PCA branches are stable and within normal limits.  Stable antegrade flow in both ICA siphons no ICA siphon stenosis. Bilateral posterior communicating artery origins are within normal limits.  Stable carotid termini.  Stable and normal MCA and ACA origins.  Visualized ACA branches are stable within normal limits. Visualized left MCA branches are stable and within normal limits.  Right MCA M1 segment remains normal.  Right MCA trifurcation remains patent.  Right MCA branches are stable.  IMPRESSION: 1.  Stable anterior circulation since 04/01/2012.  No right MCA stenosis or major branch occlusion identified. 2.  Posterior circulation better visualized today.  Extensive basilar artery atherosclerosis but no hemodynamically significant stenosis suspected.  Bilateral fetal PCA origins.   Original Report Authenticated By: Erskine Speed, M.D.    Scheduled Meds: . aspirin  325 mg Oral Daily  . atorvastatin  80 mg Oral q1800  . cholecalciferol  5,000 Units Oral Daily  . donepezil  5 mg Oral QHS  . enoxaparin (LOVENOX) injection  40 mg Subcutaneous Q24H  . Fluticasone-Salmeterol  1 puff Inhalation BID  . folic acid  1 mg Oral BID  . glimepiride  2 mg Oral Q breakfast  . insulin aspart  0-5 Units Subcutaneous QHS  . insulin aspart  0-9 Units Subcutaneous TID WC  . levothyroxine  100 mcg Oral QAC breakfast  . sertraline  200 mg Oral Daily  . vitamin B-12  1,000 mcg Oral Daily   Continuous Infusions: . sodium chloride 75 mL/hr at 09/08/12 2013     Time spent: 25 minutes    [name]  [facility] Pager [phone]. If 7PM-7AM, please contact night-coverage at [email or web address], password [email or web address] 09/09/2012, 11:28 AM  LOS: 1 day

## 2012-09-10 DIAGNOSIS — E785 Hyperlipidemia, unspecified: Secondary | ICD-10-CM

## 2012-09-10 DIAGNOSIS — I10 Essential (primary) hypertension: Secondary | ICD-10-CM

## 2012-09-10 DIAGNOSIS — R112 Nausea with vomiting, unspecified: Secondary | ICD-10-CM

## 2012-09-10 DIAGNOSIS — R5381 Other malaise: Secondary | ICD-10-CM | POA: Diagnosis not present

## 2012-09-10 DIAGNOSIS — M353 Polymyalgia rheumatica: Secondary | ICD-10-CM

## 2012-09-10 DIAGNOSIS — E119 Type 2 diabetes mellitus without complications: Secondary | ICD-10-CM

## 2012-09-10 DIAGNOSIS — I517 Cardiomegaly: Secondary | ICD-10-CM

## 2012-09-10 HISTORY — PX: TRANSTHORACIC ECHOCARDIOGRAM: SHX275

## 2012-09-10 LAB — GLUCOSE, CAPILLARY
Glucose-Capillary: 107 mg/dL — ABNORMAL HIGH (ref 70–99)
Glucose-Capillary: 84 mg/dL (ref 70–99)

## 2012-09-10 NOTE — Progress Notes (Signed)
[facility] PROGRESS NOTE  SHAMARRA WARDA ZOX:[id number] DOB: 11-01-1934 DOA: 09/08/2012 PCP: Gwen Pounds, MD  Assessment/Plan:  Acute rt sided CVA  as seen on MRI brain with rt superior frontal gyrus infarct.  Left sided weakness now resolved. pending stroke w/up including 2D echo, carotids, PT/ OT eval, lipid panel and A1C.  Patient on full dose of aspirin prior to symptoms and had recent stroke 5 months back.  switched to plavix .  Needs aggressive lifestyle and risk factor modifications.  Carotid doppler shows 40% .occulusion on lt. side. 2d echo pending. If TTE negative,  Neuro recommends TEE.   HTN  allow permissive BP   Hyperlipidemia  placed on lipitor. Check lipid panel   DM  on amaryl. Check A1C. continue SSI   Hypothyroidism  continue synthroid   OSA  on CPAP   DVT prophylaxis: sq lovenox   Code Status: full  Family Communication: none at bedside  Disposition Plan: home once stable and w/up completed   Consultants:  Neurology   Procedures:  None   Antibiotics:  none   HPI/Subjective: No overnight issues  Objective: Filed Vitals:   09/09/12 2000 09/09/12 2351 09/10/12 0022 09/10/12 0355  BP: 128/46 173/92 153/71 152/99  Pulse: 68 76 68 63  Temp: 97.6 F (36.4 C) 98 F (36.7 C)  97.4 F (36.3 C)  TempSrc: Oral Oral  Oral  Resp: 18 18 16 18   Height:      Weight:      SpO2: 93% 95%  92%    Intake/Output Summary (Last 24 hours) at 09/10/12 1048 Last data filed at 09/10/12 0900  Gross per 24 hour  Intake   1980 ml  Output   1352 ml  Net    628 ml   Filed Weights   09/08/12 2136 09/08/12 2229  Weight: 93.4 kg (205 lb 14.6 oz) 99.292 kg (218 lb 14.4 oz)    Exam:  Gen: elderly female in NAD  HEENT: no pallor, moist oral mucosa  Chest: clear b/l  CVS: NS1&S2, no murmurs  Abd: ND, NT, BS+  Ext: warm, no edema  CNS: AAOX3, no focal deficit   Data Reviewed: Basic Metabolic Panel:  Recent Labs Lab 09/08/12 1845  09/08/12 1851  NA 137 140  K 4.5 4.5  CL 104 106  CO2 24  --   GLUCOSE 127* 129*  BUN 28* 27*  CREATININE 1.35* 1.30*  CALCIUM 9.1  --    Liver Function Tests:  Recent Labs Lab 09/08/12 1845  AST 17  ALT 17  ALKPHOS 62  BILITOT 0.2*  PROT 6.9  ALBUMIN 3.7   No results found for this basename: LIPASE, AMYLASE,  in the last 168 hours No results found for this basename: AMMONIA,  in the last 168 hours CBC:  Recent Labs Lab 09/08/12 1845 09/08/12 1851  WBC 6.8  --   NEUTROABS 3.4  --   HGB 13.1 13.9  HCT 39.0 41.0  MCV 92.6  --   PLT 158  --    Cardiac Enzymes:  Recent Labs Lab 09/08/12 1847  TROPONINI <0.30   BNP (last 3 results) No results found for this basename: PROBNP,  in the last 8760 hours CBG:  Recent Labs Lab 09/09/12 1207 09/09/12 1700 09/09/12 1749 09/09/12 2011 09/10/12 0755  GLUCAP 78 58* 83 86 81    No results found for this or any previous visit (from the past 240 hour(s)).   Studies: Ct Head (brain) Wo Contrast  09/08/2012   *RADIOLOGY REPORT*  Clinical Data: Code stroke, left foot paralysis  CT HEAD WITHOUT CONTRAST  Technique:  Contiguous axial images were obtained from the base of the skull through the vertex without contrast.  Comparison: Brain MRA 04/01/2012, most recent head CT 03/27/2009  Findings: Cortical volume loss noted with proportional ventricular prominence.  Periventricular white matter hypodensity likely indicates small vessel ischemic change.  No acute hemorrhage, acute infarction, or mass lesion is identified.  No midline shift.  No skull fracture.  IMPRESSION: No acute intracranial finding. These results were called by telephone on 09/08/2012 at 6:55 p.m. to Dr. Hyacinth Meeker, who verbally acknowledged these results.   Original Report Authenticated By: Christiana Pellant, M.D.   Mri Brain Without Contrast  09/08/2012   *RADIOLOGY REPORT*  Clinical Data:  78 year old female Code stroke.  Left lower extremity symptoms. Difficulty  walking beginning at 1700 hours. Nausea and vomiting.  Comparison: Head CT without contrast 09/08/2012.  Brain MRI/MRA 03/31/2012.  MRI HEAD WITHOUT CONTRAST  Technique: Multiplanar, multiecho pulse sequences of the brain and surrounding structures were obtained according to standard protocol without intravenous contrast.  Findings: Several small up to 10 mm areas of restricted diffusion in the right superior frontal gyrus cortex and subcortical white matter (most pronounced on series 301 image 21).  No associated mass effect or hemorrhage.  Confluent T2 and FLAIR hyperintensity in this region as well as in the opposite hemisphere white matter not significantly changed from the prior study. Major intracranial vascular flow voids are stable.  Interval evolved right cerebellar infarcts which were acute in February.  Encephalomalacia.  Otherwise no significant interval change in gray and white matter signal. Stable ventricle size and configuration.  Negative pituitary, cervicomedullary junction, and visualized cervical spine.  Visualized orbit soft tissues are within normal limits.  Stable paranasal sinuses and mastoids.  Normal bone marrow signal. Negative scalp soft tissues.  IMPRESSION: 1.  Small patchy acute infarcts in the right superior frontal gyrus, right MCA territory.  No mass effect or hemorrhage. 2.  Interval expected evolution of the right cerebellar infarct that occurred in February. 3.  Stable brain otherwise. 4.  MRA findings are below.  MRA HEAD WITHOUT CONTRAST  Technique: Angiographic images of the Circle of Willis were obtained using MRA technique without  intravenous contrast.  Findings: Study is intermittently degraded by motion artifact despite repeated imaging attempts.  Stable antegrade flow in the posterior circulation with dominant distal right vertebral artery and left vertebral artery terminating PICA.  Stable right PICA.  Stable basilar artery with moderate irregularity.  No  hemodynamically significant basilar stenosis suspected.  SCA origins are normal.  Bilateral fetal type PCA origins re-identified.  Bilateral PCA branches are stable and within normal limits.  Stable antegrade flow in both ICA siphons no ICA siphon stenosis. Bilateral posterior communicating artery origins are within normal limits.  Stable carotid termini.  Stable and normal MCA and ACA origins.  Visualized ACA branches are stable within normal limits. Visualized left MCA branches are stable and within normal limits.  Right MCA M1 segment remains normal.  Right MCA trifurcation remains patent.  Right MCA branches are stable.  IMPRESSION: 1.  Stable anterior circulation since 04/01/2012.  No right MCA stenosis or major branch occlusion identified. 2.  Posterior circulation better visualized today.  Extensive basilar artery atherosclerosis but no hemodynamically significant stenosis suspected.  Bilateral fetal PCA origins.   Original Report Authenticated By: Erskine Speed, M.D.   Mr Mra Head/brain Wo Cm  09/08/2012   *RADIOLOGY REPORT*  Clinical Data:  78 year old female Code stroke.  Left lower extremity symptoms. Difficulty walking beginning at 1700 hours. Nausea and vomiting.  Comparison: Head CT without contrast 09/08/2012.  Brain MRI/MRA 03/31/2012.  MRI HEAD WITHOUT CONTRAST  Technique: Multiplanar, multiecho pulse sequences of the brain and surrounding structures were obtained according to standard protocol without intravenous contrast.  Findings: Several small up to 10 mm areas of restricted diffusion in the right superior frontal gyrus cortex and subcortical white matter (most pronounced on series 301 image 21).  No associated mass effect or hemorrhage.  Confluent T2 and FLAIR hyperintensity in this region as well as in the opposite hemisphere white matter not significantly changed from the prior study. Major intracranial vascular flow voids are stable.  Interval evolved right cerebellar infarcts which were  acute in February.  Encephalomalacia.  Otherwise no significant interval change in gray and white matter signal. Stable ventricle size and configuration.  Negative pituitary, cervicomedullary junction, and visualized cervical spine.  Visualized orbit soft tissues are within normal limits.  Stable paranasal sinuses and mastoids.  Normal bone marrow signal. Negative scalp soft tissues.  IMPRESSION: 1.  Small patchy acute infarcts in the right superior frontal gyrus, right MCA territory.  No mass effect or hemorrhage. 2.  Interval expected evolution of the right cerebellar infarct that occurred in February. 3.  Stable brain otherwise. 4.  MRA findings are below.  MRA HEAD WITHOUT CONTRAST  Technique: Angiographic images of the Circle of Willis were obtained using MRA technique without  intravenous contrast.  Findings: Study is intermittently degraded by motion artifact despite repeated imaging attempts.  Stable antegrade flow in the posterior circulation with dominant distal right vertebral artery and left vertebral artery terminating PICA.  Stable right PICA.  Stable basilar artery with moderate irregularity.  No hemodynamically significant basilar stenosis suspected.  SCA origins are normal.  Bilateral fetal type PCA origins re-identified.  Bilateral PCA branches are stable and within normal limits.  Stable antegrade flow in both ICA siphons no ICA siphon stenosis. Bilateral posterior communicating artery origins are within normal limits.  Stable carotid termini.  Stable and normal MCA and ACA origins.  Visualized ACA branches are stable within normal limits. Visualized left MCA branches are stable and within normal limits.  Right MCA M1 segment remains normal.  Right MCA trifurcation remains patent.  Right MCA branches are stable.  IMPRESSION: 1.  Stable anterior circulation since 04/01/2012.  No right MCA stenosis or major branch occlusion identified. 2.  Posterior circulation better visualized today.  Extensive  basilar artery atherosclerosis but no hemodynamically significant stenosis suspected.  Bilateral fetal PCA origins.   Original Report Authenticated By: Erskine Speed, M.D.    Scheduled Meds: . atorvastatin  80 mg Oral q1800  . cholecalciferol  5,000 Units Oral Daily  . clopidogrel  75 mg Oral Q breakfast  . donepezil  5 mg Oral QHS  . enoxaparin (LOVENOX) injection  40 mg Subcutaneous Q24H  . Fluticasone-Salmeterol  1 puff Inhalation BID  . folic acid  1 mg Oral BID  . glimepiride  2 mg Oral Q breakfast  . insulin aspart  0-5 Units Subcutaneous QHS  . insulin aspart  0-9 Units Subcutaneous TID WC  . levothyroxine  100 mcg Oral QAC breakfast  . sertraline  200 mg Oral Daily  . vitamin B-12  1,000 mcg Oral Daily   Continuous Infusions: . sodium chloride 75 mL/hr at 09/08/12 2013  Time spent: 25 minutes    Dori Devino  [facility] Pager [phone]. If 7PM-7AM, please contact night-coverage at [email or web address], password [facility] 09/10/2012, 10:48 AM  LOS: 2 days

## 2012-09-10 NOTE — Progress Notes (Signed)
  Echocardiogram 2D Echocardiogram has been performed.  [name] 09/10/2012, 12:27 PM

## 2012-09-10 NOTE — Progress Notes (Signed)
Pt refused IV fluids stating "Its making me pee every 15 mins". IV fluids stooped per pt request.

## 2012-09-10 NOTE — Progress Notes (Signed)
Stroke Team Progress Note  HISTORY [name] is a 78 y.o. female who was LKW by daughter at 9 on 09/08/2012. Patient stated that she drove home and at 1715 she tried to get out of her car and her arm would not open the door. EMS was called. She stated that she was fine prior to that moment.  Patient stated she takes a baby aspirin at home. Records indicate that she takes a full aspirin. Of note, she is also on aricept. She has history of stroke and was admitted in Feb 2014 with right cerebellar stroke. Echo and carotids unremarkable at that time.   Date last known well: Date: 09/08/2012  Time last known well: Time: 14:00  NIHSS=2 (left arm sensory deficit, mild left facial droop)  tPA Given: No: resolving symptoms   SUBJECTIVE Son present today. Pt without complaints. Feels back to baseline. Has beach trip planned for next week.   OBJECTIVE Most recent Vital Signs: Filed Vitals:   09/09/12 2351 09/10/12 0022 09/10/12 0355 09/10/12 1331  BP: 173/92 153/71 152/99 158/73  Pulse: 76 68 63 82  Temp: 98 F (36.7 C)  97.4 F (36.3 C) 97.7 F (36.5 C)  TempSrc: Oral  Oral Oral  Resp: 18 16 18 20   Height:      Weight:      SpO2: 95%  92% 93%   CBG (last 3)   Recent Labs  09/09/12 2011 09/10/12 0755 09/10/12 1205  GLUCAP 86 81 107*    IV Fluid Intake:   . sodium chloride 75 mL/hr at 09/08/12 2013    MEDICATIONS  . atorvastatin  80 mg Oral q1800  . cholecalciferol  5,000 Units Oral Daily  . clopidogrel  75 mg Oral Q breakfast  . donepezil  5 mg Oral QHS  . enoxaparin (LOVENOX) injection  40 mg Subcutaneous Q24H  . Fluticasone-Salmeterol  1 puff Inhalation BID  . folic acid  1 mg Oral BID  . glimepiride  2 mg Oral Q breakfast  . insulin aspart  0-5 Units Subcutaneous QHS  . insulin aspart  0-9 Units Subcutaneous TID WC  . levothyroxine  100 mcg Oral QAC breakfast  . sertraline  200 mg Oral Daily  . vitamin B-12  1,000 mcg Oral Daily   PRN:  HYDROmorphone  (DILAUDID) injection  Diet:  Carb Control  Thin liquids Activity:  Bathroom privileg with assistance DVT Prophylaxis:  Lovenox  CLINICALLY SIGNIFICANT STUDIES Basic Metabolic Panel:   Recent Labs Lab 09/08/12 1845 09/08/12 1851  NA 137 140  K 4.5 4.5  CL 104 106  CO2 24  --   GLUCOSE 127* 129*  BUN 28* 27*  CREATININE 1.35* 1.30*  CALCIUM 9.1  --    Liver Function Tests:   Recent Labs Lab 09/08/12 1845  AST 17  ALT 17  ALKPHOS 62  BILITOT 0.2*  PROT 6.9  ALBUMIN 3.7   CBC:   Recent Labs Lab 09/08/12 1845 09/08/12 1851  WBC 6.8  --   NEUTROABS 3.4  --   HGB 13.1 13.9  HCT 39.0 41.0  MCV 92.6  --   PLT 158  --    Coagulation:   Recent Labs Lab 09/08/12 1845  LABPROT 12.6  INR 0.96   Cardiac Enzymes:   Recent Labs Lab 09/08/12 1847  TROPONINI <0.30   Urinalysis: No results found for this basename: COLORURINE, APPERANCEUR, LABSPEC, PHURINE, GLUCOSEU, HGBUR, BILIRUBINUR, KETONESUR, PROTEINUR, UROBILINOGEN, NITRITE, LEUKOCYTESUR,  in the last 168 hours Lipid  Panel    Component Value Date/Time   CHOL 122 09/09/2012 0711   TRIG 112 09/09/2012 0711   HDL 47 09/09/2012 0711   CHOLHDL 2.6 09/09/2012 0711   VLDL 22 09/09/2012 0711   LDLCALC 53 09/09/2012 0711   HgbA1C  Lab Results  Component Value Date   HGBA1C 5.5 09/08/2012    Urine Drug Screen:     Component Value Date/Time   LABOPIA NONE DETECTED 09/09/2012 0026   COCAINSCRNUR NONE DETECTED 09/09/2012 0026   LABBENZ NONE DETECTED 09/09/2012 0026   AMPHETMU NONE DETECTED 09/09/2012 0026   THCU NONE DETECTED 09/09/2012 0026   LABBARB NONE DETECTED 09/09/2012 0026    Alcohol Level: No results found for this basename: ETH,  in the last 168 hours  Ct Head (brain) Wo Contrast 09/08/2012  No acute intracranial finding.   Mri Brain Without Contrast 09/08/2012  1.  Small patchy acute infarcts in the right superior frontal gyrus, right MCA territory.  No mass effect or hemorrhage. 2.  Interval expected  evolution of the right cerebellar infarct that occurred in February. 3.  Stable brain otherwise. 4.  MRA findings are below.    MRA HEAD WITHOUT CONTRAST 09/08/12 1.  Stable anterior circulation since 04/01/2012.  No right MCA stenosis or major branch occlusion identified. 2.  Posterior circulation better visualized today.  Extensive basilar artery atherosclerosis but no hemodynamically significant stenosis suspected.  Bilateral fetal PCA origins.     2D Echocardiogram  - pending  Carotid Doppler  The right internal carotid artery demonstrates elevated velocities suggestive of <40% stenosis. There is no evidence of hemodynamically significant left internal carotid artery stenosis >40%. Vertebral arteries are patent with antegrade flow.   CXR    EKG SR rate 75 BPM RBBB  Therapy Recommendations - Outpatient PT  Physical Exam   General - pleasant 78 year old female in no acute distress. Heart - Regular rate and rhythm - no murmer noted Lungs - Clear to auscultation Abdomen - Soft - non tender Skin - Warm and dry  NEUROLOGIC:   MENTAL STATUS: awake, alert, oriented, language fluent,  follows simple commands.  CRANIAL NERVES: pupils equal and reactive to light,extraocular muscles intact, facial sensation and strength symmetric, uvula midlinec, tongue deviates to the left when protruded. MOTOR: normal bulk and tone, Strength -5 over 5 throughout. SENSORY: normal and symmetric to light touch  COORDINATION: finger-nose-finger normal    ASSESSMENT [name] is a 78 y.o. female presenting with left upper extremity paresis. TPA was not given secondary to rapidly resolving symptoms.  MRI confirms small patchy acute infarcts in the right superior frontal gyrus, right MCA territory as well as a previous right cerebellar infarct from February. Infarcts felt to be embolic of uncertain etiology.On aspirin 325 mg orally every day prior to admission. Now on Plavix 75mg  daily for secondary  stroke prevention. Patient with resolution of deficits. Work up underway.   Previous right cerebellar infarct  Renal insufficiency  Hyperlipidemia - cholesterol 122 LDL 53 - Lipitor 80 mg daily prior to admission.  Hypertension history  Diabetes mellitus - hemoglobin A1c 5.5  Hospital day # 2  TREATMENT/PLAN  Continue plavix 75mg  daily for secondary stroke prevention.  Await 2-D echo results. Will probably need a TEE to evaluate for source of emboli.  Outpatient physical therapy planned.  Delton See PA-C [facility] Pager [phone] 09/10/2012, 1:37 PM   I have personally obtained a history, examined the patient, evaluated imaging results, and formulated the  assessment and plan of care. I agree with the above. Will need TEE on Monday, if TTE is negative.  Suanne Marker, MD 09/10/2012, 1:37 PM Certified in Neurology, Neurophysiology and Neuroimaging [facility] - Stroke Team  Please refer to [email or web address] for on-call Stroke MD

## 2012-09-11 DIAGNOSIS — E785 Hyperlipidemia, unspecified: Secondary | ICD-10-CM | POA: Diagnosis not present

## 2012-09-11 DIAGNOSIS — I10 Essential (primary) hypertension: Secondary | ICD-10-CM | POA: Diagnosis not present

## 2012-09-11 DIAGNOSIS — I6350 Cerebral infarction due to unspecified occlusion or stenosis of unspecified cerebral artery: Secondary | ICD-10-CM | POA: Diagnosis not present

## 2012-09-11 LAB — GLUCOSE, CAPILLARY
Glucose-Capillary: 74 mg/dL (ref 70–99)
Glucose-Capillary: 79 mg/dL (ref 70–99)

## 2012-09-11 NOTE — Progress Notes (Signed)
Stroke Team Progress Note  HISTORY [name] is a 78 y.o. female who was LKW by daughter at 99 on 09/08/2012. Patient stated that she drove home and at 1715 she tried to get out of her car and her arm would not open the door. EMS was called. She stated that she was fine prior to that moment. Patient stated she takes a baby aspirin at home. Records indicate that she takes a full aspirin. Of note, she is also on aricept. She has history of stroke and was admitted in Feb 2014 with right cerebellar stroke. Echo and carotids unremarkable at that time.  She did not receive TPA as her symptoms were resolving.   SUBJECTIVE Son at bedside.  OBJECTIVE Most recent Vital Signs: Filed Vitals:   09/10/12 1331 09/10/12 2044 09/10/12 2100 09/11/12 0608  BP: 158/73  148/58 160/90  Pulse: 82  72 73  Temp: 97.7 F (36.5 C)  97.5 F (36.4 C) 97.7 F (36.5 C)  TempSrc: Oral  Oral Oral  Resp: 20  18 18   Height:      Weight:      SpO2: 93% 94% 96% 96%   CBG (last 3)   Recent Labs  09/10/12 1657 09/10/12 2103 09/11/12 0841  GLUCAP 105* 84 109*    IV Fluid Intake:      MEDICATIONS  . atorvastatin  80 mg Oral q1800  . cholecalciferol  5,000 Units Oral Daily  . clopidogrel  75 mg Oral Q breakfast  . donepezil  5 mg Oral QHS  . enoxaparin (LOVENOX) injection  40 mg Subcutaneous Q24H  . Fluticasone-Salmeterol  1 puff Inhalation BID  . folic acid  1 mg Oral BID  . glimepiride  2 mg Oral Q breakfast  . insulin aspart  0-5 Units Subcutaneous QHS  . insulin aspart  0-9 Units Subcutaneous TID WC  . levothyroxine  100 mcg Oral QAC breakfast  . sertraline  200 mg Oral Daily  . vitamin B-12  1,000 mcg Oral Daily   PRN:  HYDROmorphone (DILAUDID) injection  Diet:  Carb Control  Thin liquids Activity:  Bathroom privileges with assistance DVT Prophylaxis:  Lovenox  CLINICALLY SIGNIFICANT STUDIES Basic Metabolic Panel:   Recent Labs Lab 09/08/12 1845 09/08/12 1851  NA 137 140  K 4.5 4.5   CL 104 106  CO2 24  --   GLUCOSE 127* 129*  BUN 28* 27*  CREATININE 1.35* 1.30*  CALCIUM 9.1  --    Liver Function Tests:   Recent Labs Lab 09/08/12 1845  AST 17  ALT 17  ALKPHOS 62  BILITOT 0.2*  PROT 6.9  ALBUMIN 3.7   CBC:   Recent Labs Lab 09/08/12 1845 09/08/12 1851  WBC 6.8  --   NEUTROABS 3.4  --   HGB 13.1 13.9  HCT 39.0 41.0  MCV 92.6  --   PLT 158  --    Coagulation:   Recent Labs Lab 09/08/12 1845  LABPROT 12.6  INR 0.96   Cardiac Enzymes:   Recent Labs Lab 09/08/12 1847  TROPONINI <0.30   Urinalysis: No results found for this basename: COLORURINE, APPERANCEUR, LABSPEC, PHURINE, GLUCOSEU, HGBUR, BILIRUBINUR, KETONESUR, PROTEINUR, UROBILINOGEN, NITRITE, LEUKOCYTESUR,  in the last 168 hours Lipid Panel    Component Value Date/Time   CHOL 122 09/09/2012 0711   TRIG 112 09/09/2012 0711   HDL 47 09/09/2012 0711   CHOLHDL 2.6 09/09/2012 0711   VLDL 22 09/09/2012 0711   LDLCALC 53 09/09/2012 0711  HgbA1C  Lab Results  Component Value Date   HGBA1C 5.5 09/08/2012    Urine Drug Screen:     Component Value Date/Time   LABOPIA NONE DETECTED 09/09/2012 0026   COCAINSCRNUR NONE DETECTED 09/09/2012 0026   LABBENZ NONE DETECTED 09/09/2012 0026   AMPHETMU NONE DETECTED 09/09/2012 0026   THCU NONE DETECTED 09/09/2012 0026   LABBARB NONE DETECTED 09/09/2012 0026    Alcohol Level: No results found for this basename: ETH,  in the last 168 hours  Ct Head (brain) Wo Contrast 09/08/2012  No acute intracranial finding.   Mri Brain Without Contrast 09/08/2012  1.  Small patchy acute infarcts in the right superior frontal gyrus, right MCA territory.  No mass effect or hemorrhage. 2.  Interval expected evolution of the right cerebellar infarct that occurred in February. 3.  Stable brain otherwise. 4.  MRA findings are below.    MRA HEAD WITHOUT CONTRAST 09/08/12 1.  Stable anterior circulation since 04/01/2012.  No right MCA stenosis or major branch occlusion  identified. 2.  Posterior circulation better visualized today.  Extensive basilar artery atherosclerosis but no hemodynamically significant stenosis suspected.  Bilateral fetal PCA origins.    2D Echocardiogram  EF 60-65% with no source of embolus. There is turbulence across the LV outflow tract but no significant gradient was recorded. Chordal SAM was seen but not valvular SAM. Normal RV size and systolic function.   Carotid Doppler  The right internal carotid artery demonstrates elevated velocities suggestive of <40% stenosis. There is no evidence of hemodynamically significant left internal carotid artery stenosis >40%. Vertebral arteries are patent with antegrade flow.  CXR    EKG SR rate 75 BPM RBBB  Therapy Recommendations - Outpatient PT & OT  Physical Exam   General - pleasant 78 year old female in no acute distress. Heart - Regular rate and rhythm - no murmer noted Lungs - Clear to auscultation Abdomen - Soft - non tender Skin - Warm and dry  NEUROLOGIC:  MENTAL STATUS: awake, alert, oriented, language fluent,  follows simple commands.  CRANIAL NERVES: pupils equal and reactive to light,extraocular muscles intact, facial sensation and strength symmetric, uvula midlinec, tongue deviates to the left when protruded. MOTOR: normal bulk and tone, Strength -5 over 5 throughout. SENSORY: normal and symmetric to light touch  COORDINATION: finger-nose-finger normal    ASSESSMENT Ms. [name] is a 78 y.o. female presenting with left upper extremity paresis. TPA was not given secondary to rapidly resolving symptoms.  MRI confirms small patchy acute infarcts in the right superior frontal gyrus, right MCA territory as well as a previous right cerebellar infarct from February. Infarcts felt to be embolic of uncertain etiology.On aspirin 325 mg orally every day prior to admission. Now on Plavix 75mg  daily for secondary stroke prevention. Patient with resolution of deficits. Work up  underway.   Previous right cerebellar infarct  Renal insufficiency  Hyperlipidemia - cholesterol 122 LDL 53 - Lipitor 80 mg daily prior to admission.  Hypertension history  Diabetes mellitus - hemoglobin A1c 5.5  Hospital day # 3  TREATMENT/PLAN  Continue plavix 75mg  daily for secondary stroke prevention. TEE to look for embolic source. Arranged with [facility] for tomorrow.  If positive for PFO (patent foramen ovale), check bilateral lower extremity venous dopplers to rule out DVT as possible source of stroke. Patient is followed by Dr. [place]. OP PT and OT  Annie Main, MSN, RN, ANVP-BC, ANP-BC, GNP-BC [facility] Pager: [phone] 09/11/2012 11:22 AM  I have personally obtained a history, examined the patient, evaluated imaging results, and formulated the assessment and plan of care. I agree with the above. Delia Heady, MD

## 2012-09-11 NOTE — Progress Notes (Signed)
[facility] PROGRESS NOTE  MAITLYN PENZA ZOX:[id number] DOB: January 21, 1935 DOA: 09/08/2012 PCP: Gwen Pounds, MD  Assessment/Plan:  Acute rt sided CVA  as seen on MRI brain with rt superior frontal gyrus infarct.  Left sided weakness now resolved. 2D echo with normal EF and grade 1 diastolic dysfunction, ( seems unchanged from last echo )  Carotid doppler shows 40% .occulusion on lt. Side. TEE requested to evaluate source of emboli as per neuro recommendation . Likely scheduled or tomorrow.  Patient on full dose of aspirin prior to symptoms and had recent stroke 5 months back. switched to plavix .  Needs aggressive lifestyle and risk factor modifications.     HTN  allow permissive BP   Hyperlipidemia  placed on lipitor.   DM  on amaryl.  A1C of 5.5. continue SSI   Hypothyroidism  continue synthroid   OSA  on CPAP   DVT prophylaxis: sq lovenox   Code Status: full  Family Communication: none at bedside  Disposition Plan: home once w/up completed   Consultants:  Neurology lebeauar cards consulted for TEE    Procedures:  None   Antibiotics:  none   HPI/Subjective: No overnight issues  Objective: Filed Vitals:   09/10/12 1331 09/10/12 2044 09/10/12 2100 09/11/12 0608  BP: 158/73  148/58 160/90  Pulse: 82  72 73  Temp: 97.7 F (36.5 C)  97.5 F (36.4 C) 97.7 F (36.5 C)  TempSrc: Oral  Oral Oral  Resp: 20  18 18   Height:      Weight:      SpO2: 93% 94% 96% 96%    Intake/Output Summary (Last 24 hours) at 09/11/12 0834 Last data filed at 09/11/12 0245  Gross per 24 hour  Intake   1320 ml  Output   2851 ml  Net  -1531 ml   Filed Weights   09/08/12 2136 09/08/12 2229  Weight: 93.4 kg (205 lb 14.6 oz) 99.292 kg (218 lb 14.4 oz)    Exam:  Gen: elderly female in NAD  HEENT: no pallor, moist oral mucosa  Chest: clear b/l  CVS: NS1&S2, no murmurs  Abd: ND, NT, BS+  Ext: warm, no edema  CNS: AAOX3, no focal deficit  Data Reviewed: Basic  Metabolic Panel:  Recent Labs Lab 09/08/12 1845 09/08/12 1851  NA 137 140  K 4.5 4.5  CL 104 106  CO2 24  --   GLUCOSE 127* 129*  BUN 28* 27*  CREATININE 1.35* 1.30*  CALCIUM 9.1  --    Liver Function Tests:  Recent Labs Lab 09/08/12 1845  AST 17  ALT 17  ALKPHOS 62  BILITOT 0.2*  PROT 6.9  ALBUMIN 3.7   No results found for this basename: LIPASE, AMYLASE,  in the last 168 hours No results found for this basename: AMMONIA,  in the last 168 hours CBC:  Recent Labs Lab 09/08/12 1845 09/08/12 1851  WBC 6.8  --   NEUTROABS 3.4  --   HGB 13.1 13.9  HCT 39.0 41.0  MCV 92.6  --   PLT 158  --    Cardiac Enzymes:  Recent Labs Lab 09/08/12 1847  TROPONINI <0.30   BNP (last 3 results) No results found for this basename: PROBNP,  in the last 8760 hours CBG:  Recent Labs Lab 09/09/12 2011 09/10/12 0755 09/10/12 1205 09/10/12 1657 09/10/12 2103  GLUCAP 86 81 107* 105* 84    No results found for this or any previous visit (from the past 240  hour(s)).   Studies: No results found.  Scheduled Meds: . atorvastatin  80 mg Oral q1800  . cholecalciferol  5,000 Units Oral Daily  . clopidogrel  75 mg Oral Q breakfast  . donepezil  5 mg Oral QHS  . enoxaparin (LOVENOX) injection  40 mg Subcutaneous Q24H  . Fluticasone-Salmeterol  1 puff Inhalation BID  . folic acid  1 mg Oral BID  . glimepiride  2 mg Oral Q breakfast  . insulin aspart  0-5 Units Subcutaneous QHS  . insulin aspart  0-9 Units Subcutaneous TID WC  . levothyroxine  100 mcg Oral QAC breakfast  . sertraline  200 mg Oral Daily  . vitamin B-12  1,000 mcg Oral Daily   Continuous Infusions:      Time spent: 25 minutes    Lafayette Dunlevy  [facility] Pager [phone]. If 7PM-7AM, please contact night-coverage at [email or web address], password [email or web address] 09/11/2012, 8:34 AM  LOS: 3 days

## 2012-09-11 NOTE — Plan of Care (Signed)
Problem: Phase II Progression Outcomes Goal: Discharge plan established Recommend outpatient OT for L UE strength/coordination after acute care d/c

## 2012-09-11 NOTE — Evaluation (Signed)
Occupational Therapy Evaluation Patient Details Name: [name] MRN: [id number] DOB: [birth year] Today's Date: 09/11/2012 Time: 7829-5621 OT Time Calculation (min): 35 min  OT Assessment / Plan / Recommendation History of present illness Pt presented with left sided weakness with MRI showing patchy acute right frontal gyrus infarcts   Clinical Impression   Pt demos L UE strength/coordination deficits, dynamic balance/safety deficits during functional mobility following TIA. Pt able to perform ADLs and ADL mobility with set up/sup. No further acute OT services are indicated at this time, all education completed and OT will sign off    OT Assessment  All further OT needs can be met in the next venue of care    Follow Up Recommendations  Outpatient OT for L UE strengthening and coordination   Barriers to Discharge  pt lives at home with her son, uncertain if her son can provided 24 hour sup initially if needed.    Equipment Recommendations  None recommended by OT    Recommendations for Other Services    Frequency       Precautions / Restrictions Precautions Precautions: Fall Restrictions Weight Bearing Restrictions: No   Pertinent Vitals/Pain No reports of pain    ADL  Grooming: Performed;Wash/dry hands;Wash/dry face;Brushing hair;Supervision/safety Where Assessed - Grooming: Unsupported standing Upper Body Bathing: Simulated;Set up;Supervision/safety Lower Body Bathing: Simulated;Supervision/safety;Set up Upper Body Dressing: Performed;Set up;Supervision/safety Where Assessed - Upper Body Dressing: Unsupported standing Lower Body Dressing: Performed;Set up;Supervision/safety Where Assessed - Lower Body Dressing: Unsupported sit to stand Toilet Transfer: Performed;Modified independent Toilet Transfer Method: Sit to Barista: Regular height toilet;Grab bars Toileting - Clothing Manipulation and Hygiene: Performed;Supervision/safety Where Assessed  - Engineer, mining and Hygiene: Standing Tub/Shower Transfer Method: Not assessed Equipment Used: Gait belt Transfers/Ambulation Related to ADLs: sup/set up for safety due to balance deficits ADL Comments: sup/set up for safety due to balance deficits, required assist to open bathroom door due to decreased L hand grip    OT Diagnosis: Generalized weakness  OT Problem List: Decreased strength;Impaired balance (sitting and/or standing);Decreased coordination OT Treatment Interventions:     OT Goals(Current goals can be found in the care plan section) Acute Rehab OT Goals Patient Stated Goal: " to return home asap "  Visit Information  Last OT Received On: 09/11/12 History of Present Illness: Pt presented with left sided weakness with MRI showing patchy acute right frontal gyrus infarcts       Prior Functioning     Home Living Family/patient expects to be discharged to:: Private residence Living Arrangements: Children Available Help at Discharge: Family;Available PRN/intermittently Type of Home: House Home Access: Stairs to enter [employer] of Steps: 5 Entrance Stairs-Rails: Right;Left;Can reach both Home Layout: One level Home Equipment: Walker - 2 wheels;Cane - single point Additional Comments: son is returning to work soon and will no longer be available 24hrs Prior Function Level of Independence: Independent with assistive device(s) Comments: Independent with ADLs and light home mgt Communication Communication: No difficulties Dominant Hand: Right         Vision/Perception Vision - History Baseline Vision: Wears glasses only for reading Patient Visual Report: No change from baseline Perception Perception: Within Functional Limits   Cognition  Cognition Arousal/Alertness: Awake/alert Behavior During Therapy: WFL for tasks assessed/performed Overall Cognitive Status: Within Functional Limits for tasks assessed    Extremity/Trunk  Assessment Upper Extremity Assessment Upper Extremity Assessment: LUE deficits/detail LUE Deficits / Details: Left shoulder flexion and abduction 3+/5, L grip 3/5 Cervical / Trunk Assessment Cervical /  Trunk Assessment: Normal     Mobility Bed Mobility Bed Mobility: Supine to Sit;Sitting - Scoot to Edge of Bed Supine to Sit: 6: Modified independent (Device/Increase time);HOB flat Sitting - Scoot to Edge of Bed: 6: Modified independent (Device/Increase time) Transfers Transfers: Sit to Stand;Stand to Sit Sit to Stand: 6: Modified independent (Device/Increase time);From bed;From toilet Stand to Sit: 6: Modified independent (Device/Increase time);To toilet;To chair/3-in-1     Exercise     Balance Balance Balance Assessed: Yes Dynamic Standing Balance Dynamic Standing - Balance Support: No upper extremity supported;During functional activity Dynamic Standing - Level of Assistance: 5: Stand by assistance   End of Session OT - End of Session Equipment Utilized During Treatment: Gait belt Activity Tolerance: Patient tolerated treatment well Patient left: in chair  GO     Galen Manila 09/11/2012, 10:55 AM

## 2012-09-12 ENCOUNTER — Encounter (HOSPITAL_COMMUNITY)
Admission: EM | Disposition: A | Discharge status: Home / Self Care | Payer: Self-pay | Source: Home / Self Care | Attending: Internal Medicine

## 2012-09-12 ENCOUNTER — Encounter (HOSPITAL_COMMUNITY): Payer: Self-pay | Admitting: *Deleted

## 2012-09-12 DIAGNOSIS — R5383 Other fatigue: Secondary | ICD-10-CM | POA: Diagnosis not present

## 2012-09-12 DIAGNOSIS — I10 Essential (primary) hypertension: Secondary | ICD-10-CM | POA: Diagnosis not present

## 2012-09-12 DIAGNOSIS — I6350 Cerebral infarction due to unspecified occlusion or stenosis of unspecified cerebral artery: Secondary | ICD-10-CM | POA: Diagnosis not present

## 2012-09-12 DIAGNOSIS — E119 Type 2 diabetes mellitus without complications: Secondary | ICD-10-CM | POA: Diagnosis not present

## 2012-09-12 DIAGNOSIS — I6789 Other cerebrovascular disease: Secondary | ICD-10-CM

## 2012-09-12 HISTORY — PX: TEE WITHOUT CARDIOVERSION: SHX5443

## 2012-09-12 LAB — GLUCOSE, CAPILLARY: Glucose-Capillary: 105 mg/dL — ABNORMAL HIGH (ref 70–99)

## 2012-09-12 SURGERY — ECHOCARDIOGRAM, TRANSESOPHAGEAL
Anesthesia: Moderate Sedation

## 2012-09-12 MED ORDER — SODIUM CHLORIDE 0.9 % IV SOLN
INTRAVENOUS | Status: DC
  Administered 2012-09-12: 500 mL via INTRAVENOUS

## 2012-09-12 MED ORDER — BUTAMBEN-TETRACAINE-BENZOCAINE 2-2-14 % EX AERO
INHALATION_SPRAY | CUTANEOUS | Status: DC | PRN
  Administered 2012-09-12: 2 via TOPICAL

## 2012-09-12 MED ORDER — FENTANYL CITRATE 0.05 MG/ML IJ SOLN
INTRAMUSCULAR | Status: DC | PRN
  Administered 2012-09-12: 25 ug via INTRAVENOUS

## 2012-09-12 MED ORDER — MIDAZOLAM HCL 10 MG/2ML IJ SOLN
INTRAMUSCULAR | Status: DC | PRN
  Administered 2012-09-12: 2 mg via INTRAVENOUS

## 2012-09-12 MED ORDER — CLOPIDOGREL BISULFATE 75 MG PO TABS: 75.0000 mg | ORAL_TABLET | Freq: Every day | ORAL | Status: DC

## 2012-09-12 NOTE — H&P (View-Only) (Signed)
Stroke Team Progress Note  HISTORY [name] is a 77 y.o. female who was LKW by daughter at 1400 on 09/08/2012. Patient stated that she drove home and at 1715 she tried to get out of her car and her arm would not open the door. EMS was called. She stated that she was fine prior to that moment. Patient stated she takes a baby aspirin at home. Records indicate that she takes a full aspirin. Of note, she is also on aricept. She has history of stroke and was admitted in Feb 2014 with right cerebellar stroke. Echo and carotids unremarkable at that time.  She did not receive TPA as her symptoms were resolving.   SUBJECTIVE Son at bedside.  OBJECTIVE Most recent Vital Signs: Filed Vitals:   09/10/12 1331 09/10/12 2044 09/10/12 2100 09/11/12 0608  BP: 158/73  148/58 160/90  Pulse: 82  72 73  Temp: 97.7 F (36.5 C)  97.5 F (36.4 C) 97.7 F (36.5 C)  TempSrc: Oral  Oral Oral  Resp: 20  18 18  Height:      Weight:      SpO2: 93% 94% 96% 96%   CBG (last 3)   Recent Labs  09/10/12 1657 09/10/12 2103 09/11/12 0841  GLUCAP 105* 84 109*    IV Fluid Intake:      MEDICATIONS  . atorvastatin  80 mg Oral q1800  . cholecalciferol  5,000 Units Oral Daily  . clopidogrel  75 mg Oral Q breakfast  . donepezil  5 mg Oral QHS  . enoxaparin (LOVENOX) injection  40 mg Subcutaneous Q24H  . Fluticasone-Salmeterol  1 puff Inhalation BID  . folic acid  1 mg Oral BID  . glimepiride  2 mg Oral Q breakfast  . insulin aspart  0-5 Units Subcutaneous QHS  . insulin aspart  0-9 Units Subcutaneous TID WC  . levothyroxine  100 mcg Oral QAC breakfast  . sertraline  200 mg Oral Daily  . vitamin B-12  1,000 mcg Oral Daily   PRN:  HYDROmorphone (DILAUDID) injection  Diet:  Carb Control  Thin liquids Activity:  Bathroom privileges with assistance DVT Prophylaxis:  Lovenox  CLINICALLY SIGNIFICANT STUDIES Basic Metabolic Panel:   Recent Labs Lab 09/08/12 1845 09/08/12 1851  NA 137 140  K 4.5 4.5   CL 104 106  CO2 24  --   GLUCOSE 127* 129*  BUN 28* 27*  CREATININE 1.35* 1.30*  CALCIUM 9.1  --    Liver Function Tests:   Recent Labs Lab 09/08/12 1845  AST 17  ALT 17  ALKPHOS 62  BILITOT 0.2*  PROT 6.9  ALBUMIN 3.7   CBC:   Recent Labs Lab 09/08/12 1845 09/08/12 1851  WBC 6.8  --   NEUTROABS 3.4  --   HGB 13.1 13.9  HCT 39.0 41.0  MCV 92.6  --   PLT 158  --    Coagulation:   Recent Labs Lab 09/08/12 1845  LABPROT 12.6  INR 0.96   Cardiac Enzymes:   Recent Labs Lab 09/08/12 1847  TROPONINI <0.30   Urinalysis: No results found for this basename: COLORURINE, APPERANCEUR, LABSPEC, PHURINE, GLUCOSEU, HGBUR, BILIRUBINUR, KETONESUR, PROTEINUR, UROBILINOGEN, NITRITE, LEUKOCYTESUR,  in the last 168 hours Lipid Panel    Component Value Date/Time   CHOL 122 09/09/2012 0711   TRIG 112 09/09/2012 0711   HDL 47 09/09/2012 0711   CHOLHDL 2.6 09/09/2012 0711   VLDL 22 09/09/2012 0711   LDLCALC 53 09/09/2012 0711     HgbA1C  Lab Results  Component Value Date   HGBA1C 5.5 09/08/2012    Urine Drug Screen:     Component Value Date/Time   LABOPIA NONE DETECTED 09/09/2012 0026   COCAINSCRNUR NONE DETECTED 09/09/2012 0026   LABBENZ NONE DETECTED 09/09/2012 0026   AMPHETMU NONE DETECTED 09/09/2012 0026   THCU NONE DETECTED 09/09/2012 0026   LABBARB NONE DETECTED 09/09/2012 0026    Alcohol Level: No results found for this basename: ETH,  in the last 168 hours  Ct Head (brain) Wo Contrast 09/08/2012  No acute intracranial finding.   Mri Brain Without Contrast 09/08/2012  1.  Small patchy acute infarcts in the right superior frontal gyrus, right MCA territory.  No mass effect or hemorrhage. 2.  Interval expected evolution of the right cerebellar infarct that occurred in February. 3.  Stable brain otherwise. 4.  MRA findings are below.    MRA HEAD WITHOUT CONTRAST 09/08/12 1.  Stable anterior circulation since 04/01/2012.  No right MCA stenosis or major branch occlusion  identified. 2.  Posterior circulation better visualized today.  Extensive basilar artery atherosclerosis but no hemodynamically significant stenosis suspected.  Bilateral fetal PCA origins.    2D Echocardiogram  EF 60-65% with no source of embolus. There is turbulence across the LV outflow tract but no significant gradient was recorded. Chordal SAM was seen but not valvular SAM. Normal RV size and systolic function.   Carotid Doppler  The right internal carotid artery demonstrates elevated velocities suggestive of <40% stenosis. There is no evidence of hemodynamically significant left internal carotid artery stenosis >40%. Vertebral arteries are patent with antegrade flow.  CXR    EKG SR rate 75 BPM RBBB  Therapy Recommendations - Outpatient PT & OT  Physical Exam   General - pleasant 77-year-old female in no acute distress. Heart - Regular rate and rhythm - no murmer noted Lungs - Clear to auscultation Abdomen - Soft - non tender Skin - Warm and dry  NEUROLOGIC:  MENTAL STATUS: awake, alert, oriented, language fluent,  follows simple commands.  CRANIAL NERVES: pupils equal and reactive to light,extraocular muscles intact, facial sensation and strength symmetric, uvula midlinec, tongue deviates to the left when protruded. MOTOR: normal bulk and tone, Strength -5 over 5 throughout. SENSORY: normal and symmetric to light touch  COORDINATION: finger-nose-finger normal    ASSESSMENT [name] is a 77 y.o. female presenting with left upper extremity paresis. TPA was not given secondary to rapidly resolving symptoms.  MRI confirms small patchy acute infarcts in the right superior frontal gyrus, right MCA territory as well as a previous right cerebellar infarct from February. Infarcts felt to be embolic of uncertain etiology.On aspirin 325 mg orally every day prior to admission. Now on Plavix 75mg daily for secondary stroke prevention. Patient with resolution of deficits. Work up  underway.   Previous right cerebellar infarct  Renal insufficiency  Hyperlipidemia - cholesterol 122 LDL 53 - Lipitor 80 mg daily prior to admission.  Hypertension history  Diabetes mellitus - hemoglobin A1c 5.5  Hospital day # 3  TREATMENT/PLAN  Continue plavix 75mg daily for secondary stroke prevention. TEE to look for embolic source. Arranged with [facility] for tomorrow.  If positive for PFO (patent foramen ovale), check bilateral lower extremity venous dopplers to rule out DVT as possible source of stroke. Patient is followed by Dr. Jordan. OP PT and OT  SHARON BIBY, MSN, RN, ANVP-BC, ANP-BC, GNP-BC [facility] Pager: [phone] 09/11/2012 11:22 AM    I have personally obtained a history, examined the patient, evaluated imaging results, and formulated the assessment and plan of care. I agree with the above. Tanor Glaspy, MD   

## 2012-09-12 NOTE — Interval H&P Note (Signed)
History and Physical Interval Note:  09/12/2012 1:03 PM  [name]  has presented today for surgery, with the diagnosis of STROKE  The various methods of treatment have been discussed with the patient and family. After consideration of risks, benefits and other options for treatment, the patient has consented to  Procedure(s): TRANSESOPHAGEAL ECHOCARDIOGRAM (TEE) (N/A) as a surgical intervention .  The patient's history has been reviewed, patient examined, no change in status, stable for surgery.  I have reviewed the patient's chart and labs.  Questions were answered to the patient's satisfaction.     Olga Millers

## 2012-09-12 NOTE — Progress Notes (Signed)
Utilization review completed.  

## 2012-09-12 NOTE — CV Procedure (Signed)
See full TEE report in [employer]; normal LV function; proximal septal thickening and chordal SAM creating narrow LVOT; turbulence in LVOT; mild atherosclerosis descending aorta; trace MR/TR/AI. Olga Millers

## 2012-09-12 NOTE — Progress Notes (Signed)
Pt discharged to home per MD order. Pt received and reviewed all discharge instructions and medication information including follow-up appointments and prescriptions. Pt also received stroke education. Pt verbalized understanding. Pt alert and oriented at discharge with no complaints of pain. NIH documented at discharge. Pt escorted to private vehicle via wheelchair by [facility], Charity fundraiser. Efraim Kaufmann

## 2012-09-12 NOTE — Discharge Summary (Signed)
Physician Discharge Summary  [name] ZOX:[id number] DOB: Jul 19, 1934 DOA: 09/08/2012  PCP: Gwen Pounds, MD  Admit date: 09/08/2012 Discharge date: 09/12/2012  Time spent: 40  minutes  Recommendations for Outpatient Follow-up:  Home with outpt PCP and  neurology follow up  Discharge Diagnoses:   Principal Problem:   CVA (cerebral infarction)  Active Problems:   Hypertension   Hyperlipidemia   Diabetes mellitus   Polymyalgia rheumatica   Discharge Condition: fair  Diet recommendation: diabetic  Filed Weights   09/08/12 2136 09/08/12 2229 09/12/12 0356  Weight: 93.4 kg (205 lb 14.6 oz) 99.292 kg (218 lb 14.4 oz) 100.1 kg (220 lb 10.9 oz)    History of present illness:  Please review admission H&P for details but in brief,   Hospital Course:  Acute rt sided CVA  as seen on MRI brain with rt superior frontal gyrus infarct.  Left sided weakness now resolved. 2D echo with normal EF and grade 1 diastolic dysfunction, ( seems unchanged from last echo ) Carotid doppler shows 40% .occulusion on lt. Side.  TEE done evaluate source of emboli as per neuro recommendation .  Reports "normal LV function; proximal septal thickening and chordal SAM creating narrow LVOT; turbulence in LVOT; mild atherosclerosis descending aorta; trace MR/TR/AI." no comment on PFO. Patient on full dose of aspirin prior to symptoms and had recent stroke 5 months back. switched to plavix .  Needs aggressive lifestyle and risk factor modifications.   HTN  allow permissive BP   Hyperlipidemia  placed on lipitor.   DM  on amaryl. A1C of 5.5. continue SSI   Hypothyroidism  continue synthroid   OSA  on CPAP    Code Status: full  Family Communication: none at bedside  Disposition Plan: home with outp follow up  Consultants:  Neurology  lebeauar cards for TEE   Procedures:  TEE on 7/22   Antibiotics:  none      Discharge Exam: Filed Vitals:   09/12/12 1320 09/12/12 1330  09/12/12 1340 09/12/12 1350  BP: 170/70 122/53 149/89 132/80  Pulse:  66 67 65  Temp:    98.3 F (36.8 C)  TempSrc:      Resp: 25 13 19 17   Height:      Weight:      SpO2: 97% 97%  97%    Gen: elderly female in NAD  HEENT: no pallor, moist oral mucosa  Chest: clear b/l  CVS: NS1&S2, no murmurs  Abd: ND, NT, BS+  Ext: warm, no edema  CNS: AAOX3, no focal deficit   Discharge Instructions     Medication List    STOP taking these medications       aspirin 325 MG tablet      TAKE these medications       acetaminophen 325 MG tablet  Commonly known as:  TYLENOL  Take 2 tablets (650 mg total) by mouth every 4 (four) hours as needed.     ADVAIR DISKUS IN  Inhale 1 tablet into the lungs as needed (shortness of breath).     benazepril 40 MG tablet  Commonly known as:  LOTENSIN  Take 40 mg by mouth daily.     Cholecalciferol 5000 UNITS capsule  Take 1 capsule (5,000 Units total) by mouth daily.     clopidogrel 75 MG tablet  Commonly known as:  PLAVIX  Take 1 tablet (75 mg total) by mouth daily with breakfast.     donepezil 5 MG tablet  Commonly known  as:  ARICEPT  Take 1 tablet (5 mg total) by mouth at bedtime.     folic acid 1 MG tablet  Commonly known as:  FOLVITE  Take 1 tablet (1 mg total) by mouth 2 (two) times daily.     glimepiride 2 MG tablet  Commonly known as:  AMARYL  Take 1 tablet (2 mg total) by mouth daily with breakfast.     levothyroxine 100 MCG tablet  Commonly known as:  SYNTHROID, LEVOTHROID  Take 1 tablet (100 mcg total) by mouth daily.     predniSONE 1 MG tablet  Commonly known as:  DELTASONE  Take 1 mg by mouth daily.     rosuvastatin 40 MG tablet  Commonly known as:  CRESTOR  Take 40 mg by mouth daily.     sertraline 100 MG tablet  Commonly known as:  ZOLOFT  Take 200 mg by mouth daily.     vitamin B-12 1000 MCG tablet  Commonly known as:  CYANOCOBALAMIN  Take 1 tablet (1,000 mcg total) by mouth daily.       Allergies   Allergen Reactions  . Adhesive (Tape)     Pulls off skin       Follow-up Information   Follow up with Gwen Pounds, MD In 1 week.   Contact information:   [address] [facility] [place] [address] [phone]       Follow up with Gates Rigg, MD In 2 months.   Contact information:   [address] [place] [address] [phone]        The results of significant diagnostics from this hospitalization (including imaging, microbiology, ancillary and laboratory) are listed below for reference.    Significant Diagnostic Studies: Ct Head (brain) Wo Contrast  09/08/2012   *RADIOLOGY REPORT*  Clinical Data: Code stroke, left foot paralysis  CT HEAD WITHOUT CONTRAST  Technique:  Contiguous axial images were obtained from the base of the skull through the vertex without contrast.  Comparison: Brain MRA 04/01/2012, most recent head CT 03/27/2009  Findings: Cortical volume loss noted with proportional ventricular prominence.  Periventricular white matter hypodensity likely indicates small vessel ischemic change.  No acute hemorrhage, acute infarction, or mass lesion is identified.  No midline shift.  No skull fracture.  IMPRESSION: No acute intracranial finding. These results were called by telephone on 09/08/2012 at 6:55 p.m. to Dr. Hyacinth Meeker, who verbally acknowledged these results.   Original Report Authenticated By: Christiana Pellant, M.D.   Mri Brain Without Contrast  09/08/2012   *RADIOLOGY REPORT*  Clinical Data:  78 year old female Code stroke.  Left lower extremity symptoms. Difficulty walking beginning at 1700 hours. Nausea and vomiting.  Comparison: Head CT without contrast 09/08/2012.  Brain MRI/MRA 03/31/2012.  MRI HEAD WITHOUT CONTRAST  Technique: Multiplanar, multiecho pulse sequences of the brain and surrounding structures were obtained according to standard protocol without intravenous contrast.  Findings: Several small up to 10 mm  areas of restricted diffusion in the right superior frontal gyrus cortex and subcortical white matter (most pronounced on series 301 image 21).  No associated mass effect or hemorrhage.  Confluent T2 and FLAIR hyperintensity in this region as well as in the opposite hemisphere white matter not significantly changed from the prior study. Major intracranial vascular flow voids are stable.  Interval evolved right cerebellar infarcts which were acute in February.  Encephalomalacia.  Otherwise no significant interval change in gray and white matter signal. Stable ventricle size and configuration.  Negative pituitary, cervicomedullary junction, and visualized cervical spine.  Visualized orbit soft tissues are within normal limits.  Stable paranasal sinuses and mastoids.  Normal bone marrow signal. Negative scalp soft tissues.  IMPRESSION: 1.  Small patchy acute infarcts in the right superior frontal gyrus, right MCA territory.  No mass effect or hemorrhage. 2.  Interval expected evolution of the right cerebellar infarct that occurred in February. 3.  Stable brain otherwise. 4.  MRA findings are below.  MRA HEAD WITHOUT CONTRAST  Technique: Angiographic images of the Circle of Willis were obtained using MRA technique without  intravenous contrast.  Findings: Study is intermittently degraded by motion artifact despite repeated imaging attempts.  Stable antegrade flow in the posterior circulation with dominant distal right vertebral artery and left vertebral artery terminating PICA.  Stable right PICA.  Stable basilar artery with moderate irregularity.  No hemodynamically significant basilar stenosis suspected.  SCA origins are normal.  Bilateral fetal type PCA origins re-identified.  Bilateral PCA branches are stable and within normal limits.  Stable antegrade flow in both ICA siphons no ICA siphon stenosis. Bilateral posterior communicating artery origins are within normal limits.  Stable carotid termini.  Stable and  normal MCA and ACA origins.  Visualized ACA branches are stable within normal limits. Visualized left MCA branches are stable and within normal limits.  Right MCA M1 segment remains normal.  Right MCA trifurcation remains patent.  Right MCA branches are stable.  IMPRESSION: 1.  Stable anterior circulation since 04/01/2012.  No right MCA stenosis or major branch occlusion identified. 2.  Posterior circulation better visualized today.  Extensive basilar artery atherosclerosis but no hemodynamically significant stenosis suspected.  Bilateral fetal PCA origins.   Original Report Authenticated By: Erskine Speed, M.D.   Mr Mra Head/brain Wo Cm  09/08/2012   *RADIOLOGY REPORT*  Clinical Data:  78 year old female Code stroke.  Left lower extremity symptoms. Difficulty walking beginning at 1700 hours. Nausea and vomiting.  Comparison: Head CT without contrast 09/08/2012.  Brain MRI/MRA 03/31/2012.  MRI HEAD WITHOUT CONTRAST  Technique: Multiplanar, multiecho pulse sequences of the brain and surrounding structures were obtained according to standard protocol without intravenous contrast.  Findings: Several small up to 10 mm areas of restricted diffusion in the right superior frontal gyrus cortex and subcortical white matter (most pronounced on series 301 image 21).  No associated mass effect or hemorrhage.  Confluent T2 and FLAIR hyperintensity in this region as well as in the opposite hemisphere white matter not significantly changed from the prior study. Major intracranial vascular flow voids are stable.  Interval evolved right cerebellar infarcts which were acute in February.  Encephalomalacia.  Otherwise no significant interval change in gray and white matter signal. Stable ventricle size and configuration.  Negative pituitary, cervicomedullary junction, and visualized cervical spine.  Visualized orbit soft tissues are within normal limits.  Stable paranasal sinuses and mastoids.  Normal bone marrow signal. Negative  scalp soft tissues.  IMPRESSION: 1.  Small patchy acute infarcts in the right superior frontal gyrus, right MCA territory.  No mass effect or hemorrhage. 2.  Interval expected evolution of the right cerebellar infarct that occurred in February. 3.  Stable brain otherwise. 4.  MRA findings are below.  MRA HEAD WITHOUT CONTRAST  Technique: Angiographic images of the Circle of Willis were obtained using MRA technique without  intravenous contrast.  Findings: Study is intermittently degraded by motion artifact despite repeated imaging attempts.  Stable antegrade flow in the posterior circulation with dominant distal  right vertebral artery and left vertebral artery terminating PICA.  Stable right PICA.  Stable basilar artery with moderate irregularity.  No hemodynamically significant basilar stenosis suspected.  SCA origins are normal.  Bilateral fetal type PCA origins re-identified.  Bilateral PCA branches are stable and within normal limits.  Stable antegrade flow in both ICA siphons no ICA siphon stenosis. Bilateral posterior communicating artery origins are within normal limits.  Stable carotid termini.  Stable and normal MCA and ACA origins.  Visualized ACA branches are stable within normal limits. Visualized left MCA branches are stable and within normal limits.  Right MCA M1 segment remains normal.  Right MCA trifurcation remains patent.  Right MCA branches are stable.  IMPRESSION: 1.  Stable anterior circulation since 04/01/2012.  No right MCA stenosis or major branch occlusion identified. 2.  Posterior circulation better visualized today.  Extensive basilar artery atherosclerosis but no hemodynamically significant stenosis suspected.  Bilateral fetal PCA origins.   Original Report Authenticated By: Erskine Speed, M.D.    Microbiology: No results found for this or any previous visit (from the past 240 hour(s)).   Labs: Basic Metabolic Panel:  Recent Labs Lab 09/08/12 1845 09/08/12 1851  NA 137 140  K  4.5 4.5  CL 104 106  CO2 24  --   GLUCOSE 127* 129*  BUN 28* 27*  CREATININE 1.35* 1.30*  CALCIUM 9.1  --    Liver Function Tests:  Recent Labs Lab 09/08/12 1845  AST 17  ALT 17  ALKPHOS 62  BILITOT 0.2*  PROT 6.9  ALBUMIN 3.7   No results found for this basename: LIPASE, AMYLASE,  in the last 168 hours No results found for this basename: AMMONIA,  in the last 168 hours CBC:  Recent Labs Lab 09/08/12 1845 09/08/12 1851  WBC 6.8  --   NEUTROABS 3.4  --   HGB 13.1 13.9  HCT 39.0 41.0  MCV 92.6  --   PLT 158  --    Cardiac Enzymes:  Recent Labs Lab 09/08/12 1847  TROPONINI <0.30   BNP: BNP (last 3 results) No results found for this basename: PROBNP,  in the last 8760 hours CBG:  Recent Labs Lab 09/11/12 1140 09/11/12 1701 09/11/12 2157 09/12/12 0725 09/12/12 1144  GLUCAP 160* 74 79 105* 83       Signed:  Jenine Krisher  [facility] 09/12/2012, 2:32 PM

## 2012-09-12 NOTE — Progress Notes (Signed)
Stroke Team Progress Note  HISTORY [name] is a 78 y.o. female who was LKW by daughter at 22 on 09/08/2012. Patient stated that she drove home and at 1715 she tried to get out of her car and her arm would not open the door. EMS was called. She stated that she was fine prior to that moment. Patient stated she takes a baby aspirin at home. Records indicate that she takes a full aspirin. Of note, she is also on aricept. She has history of stroke and was admitted in Feb 2014 with right cerebellar stroke. Echo and carotids unremarkable at that time.  She did not receive TPA as her symptoms were resolving.   SUBJECTIVE Patient for TEE.  OBJECTIVE Most recent Vital Signs: Filed Vitals:   09/11/12 2103 09/12/12 0000 09/12/12 0356 09/12/12 0732  BP:  128/59 148/83 170/83  Pulse:  66 81 65  Temp:  97.5 F (36.4 C) 97.7 F (36.5 C) 98.4 F (36.9 C)  TempSrc:  Oral Oral   Resp:  20 18 18   Height:      Weight:   100.1 kg (220 lb 10.9 oz)   SpO2: 95% 94% 92% 96%   CBG (last 3)   Recent Labs  09/11/12 1701 09/11/12 2157 09/12/12 0725  GLUCAP 74 79 105*    IV Fluid Intake:      MEDICATIONS  . atorvastatin  80 mg Oral q1800  . cholecalciferol  5,000 Units Oral Daily  . clopidogrel  75 mg Oral Q breakfast  . donepezil  5 mg Oral QHS  . enoxaparin (LOVENOX) injection  40 mg Subcutaneous Q24H  . Fluticasone-Salmeterol  1 puff Inhalation BID  . folic acid  1 mg Oral BID  . glimepiride  2 mg Oral Q breakfast  . insulin aspart  0-5 Units Subcutaneous QHS  . insulin aspart  0-9 Units Subcutaneous TID WC  . levothyroxine  100 mcg Oral QAC breakfast  . sertraline  200 mg Oral Daily  . vitamin B-12  1,000 mcg Oral Daily   PRN:  HYDROmorphone (DILAUDID) injection  Diet:  NPO  Thin liquids Activity:  Bathroom privileges with assistance DVT Prophylaxis:  Lovenox  CLINICALLY SIGNIFICANT STUDIES Basic Metabolic Panel:   Recent Labs Lab 09/08/12 1845 09/08/12 1851  NA 137 140   K 4.5 4.5  CL 104 106  CO2 24  --   GLUCOSE 127* 129*  BUN 28* 27*  CREATININE 1.35* 1.30*  CALCIUM 9.1  --    Liver Function Tests:   Recent Labs Lab 09/08/12 1845  AST 17  ALT 17  ALKPHOS 62  BILITOT 0.2*  PROT 6.9  ALBUMIN 3.7   CBC:   Recent Labs Lab 09/08/12 1845 09/08/12 1851  WBC 6.8  --   NEUTROABS 3.4  --   HGB 13.1 13.9  HCT 39.0 41.0  MCV 92.6  --   PLT 158  --    Coagulation:   Recent Labs Lab 09/08/12 1845  LABPROT 12.6  INR 0.96   Cardiac Enzymes:   Recent Labs Lab 09/08/12 1847  TROPONINI <0.30   Urinalysis: No results found for this basename: COLORURINE, APPERANCEUR, LABSPEC, PHURINE, GLUCOSEU, HGBUR, BILIRUBINUR, KETONESUR, PROTEINUR, UROBILINOGEN, NITRITE, LEUKOCYTESUR,  in the last 168 hours Lipid Panel    Component Value Date/Time   CHOL 122 09/09/2012 0711   TRIG 112 09/09/2012 0711   HDL 47 09/09/2012 0711   CHOLHDL 2.6 09/09/2012 0711   VLDL 22 09/09/2012 0711   LDLCALC  53 09/09/2012 0711   HgbA1C  Lab Results  Component Value Date   HGBA1C 5.5 09/08/2012    Urine Drug Screen:     Component Value Date/Time   LABOPIA NONE DETECTED 09/09/2012 0026   COCAINSCRNUR NONE DETECTED 09/09/2012 0026   LABBENZ NONE DETECTED 09/09/2012 0026   AMPHETMU NONE DETECTED 09/09/2012 0026   THCU NONE DETECTED 09/09/2012 0026   LABBARB NONE DETECTED 09/09/2012 0026    Alcohol Level: No results found for this basename: ETH,  in the last 168 hours  Ct Head (brain) Wo Contrast 09/08/2012  No acute intracranial finding.   Mri Brain Without Contrast 09/08/2012  1.  Small patchy acute infarcts in the right superior frontal gyrus, right MCA territory.  No mass effect or hemorrhage. 2.  Interval expected evolution of the right cerebellar infarct that occurred in February. 3.  Stable brain otherwise. 4.  MRA findings are below.    MRA HEAD WITHOUT CONTRAST 09/08/12 1.  Stable anterior circulation since 04/01/2012.  No right MCA stenosis or major branch  occlusion identified. 2.  Posterior circulation better visualized today.  Extensive basilar artery atherosclerosis but no hemodynamically significant stenosis suspected.  Bilateral fetal PCA origins.    2D Echocardiogram  EF 60-65% with no source of embolus. There is turbulence across the LV outflow tract but no significant gradient was recorded. Chordal SAM was seen but not valvular SAM. Normal RV size and systolic function.   Carotid Doppler  The right internal carotid artery demonstrates elevated velocities suggestive of <40% stenosis. There is no evidence of hemodynamically significant left internal carotid artery stenosis >40%. Vertebral arteries are patent with antegrade flow.  CXR    EKG SR rate 75 BPM RBBB  Therapy Recommendations - Outpatient PT & OT  Physical Exam   General - pleasant 78 year old female in no acute distress. Heart - Regular rate and rhythm - no murmer noted Lungs - Clear to auscultation Abdomen - Soft - non tender Skin - Warm and dry  NEUROLOGIC:  MENTAL STATUS: awake, alert, oriented, language fluent,  follows simple commands.  CRANIAL NERVES: pupils equal and reactive to light,extraocular muscles intact, facial sensation and strength symmetric, uvula midlinec, tongue deviates to the left when protruded. MOTOR: normal bulk and tone, Strength -5 over 5 throughout. SENSORY: normal and symmetric to light touch  COORDINATION: finger-nose-finger normal    ASSESSMENT [name] is a 78 y.o. female presenting with left upper extremity paresis. TPA was not given secondary to rapidly resolving symptoms.  MRI confirms small patchy acute infarcts in the right superior frontal gyrus, right MCA territory as well as a previous right cerebellar infarct from February. Infarcts felt to be embolic of uncertain etiology.On aspirin 325 mg orally every day prior to admission. Now on Plavix 75mg  daily for secondary stroke prevention. Patient with resolution of deficits. Work  up underway.   Previous right cerebellar infarct  Renal insufficiency  Hyperlipidemia - cholesterol 122 LDL 53 - Lipitor 80 mg daily prior to admission.  Hypertension history  Diabetes mellitus - hemoglobin A1c 5.5  Hospital day # 4  TREATMENT/PLAN  Continue plavix 75mg  daily for secondary stroke prevention. TEE to look for embolic source. If positive for PFO (patent foramen ovale), check bilateral lower extremity venous dopplers to rule out DVT as possible source of stroke. Patient is followed by Dr. [place] Follow up with Dr. Pearlean Brownie in 2 months  I have personally obtained a history, examined the patient, evaluated imaging results, and formulated the  assessment and plan of care. I agree with the above. Delia Heady, MD  Gwendolyn Lima. Manson Passey, [facility], MBA, MHA [facility] Pager: [phone] 09/12/2012 8:05 PM

## 2012-09-12 NOTE — Progress Notes (Signed)
Echocardiogram Echocardiogram Transesophageal has been performed.  [name] 09/12/2012, 1:44 PM

## 2012-09-12 NOTE — Care Management (Signed)
Care Manager faxed information over to the [facility] on [address] for outpatient PT/OT services. No further needs from CM at this time. Thanks Gala Lewandowsky, RN,BSN [phone]

## 2012-09-13 ENCOUNTER — Encounter (HOSPITAL_COMMUNITY): Payer: Self-pay | Admitting: Cardiology

## 2012-09-14 DIAGNOSIS — M1710 Unilateral primary osteoarthritis, unspecified knee: Secondary | ICD-10-CM | POA: Diagnosis not present

## 2012-09-14 DIAGNOSIS — M5137 Other intervertebral disc degeneration, lumbosacral region: Secondary | ICD-10-CM | POA: Diagnosis not present

## 2012-09-14 DIAGNOSIS — M353 Polymyalgia rheumatica: Secondary | ICD-10-CM | POA: Diagnosis not present

## 2012-09-14 DIAGNOSIS — M899 Disorder of bone, unspecified: Secondary | ICD-10-CM | POA: Diagnosis not present

## 2012-09-25 ENCOUNTER — Ambulatory Visit: Payer: Medicare Other | Admitting: Physical Therapy

## 2012-09-27 ENCOUNTER — Other Ambulatory Visit: Payer: Self-pay

## 2012-09-27 ENCOUNTER — Ambulatory Visit: Payer: Medicare Other | Attending: Internal Medicine | Admitting: Physical Therapy

## 2012-09-27 ENCOUNTER — Encounter: Payer: Medicare Other | Admitting: Occupational Therapy

## 2012-09-27 ENCOUNTER — Ambulatory Visit: Payer: Medicare Other | Admitting: Occupational Therapy

## 2012-09-27 DIAGNOSIS — R269 Unspecified abnormalities of gait and mobility: Secondary | ICD-10-CM | POA: Insufficient documentation

## 2012-09-27 DIAGNOSIS — Z9181 History of falling: Secondary | ICD-10-CM | POA: Insufficient documentation

## 2012-09-27 DIAGNOSIS — M6281 Muscle weakness (generalized): Secondary | ICD-10-CM | POA: Insufficient documentation

## 2012-09-27 DIAGNOSIS — R279 Unspecified lack of coordination: Secondary | ICD-10-CM | POA: Diagnosis not present

## 2012-09-27 DIAGNOSIS — Z5189 Encounter for other specified aftercare: Secondary | ICD-10-CM | POA: Insufficient documentation

## 2012-10-01 ENCOUNTER — Other Ambulatory Visit: Payer: Self-pay | Admitting: Internal Medicine

## 2012-10-02 ENCOUNTER — Ambulatory Visit: Payer: Medicare Other | Admitting: Physical Therapy

## 2012-10-02 ENCOUNTER — Ambulatory Visit: Payer: Medicare Other | Admitting: Occupational Therapy

## 2012-10-03 ENCOUNTER — Other Ambulatory Visit: Payer: Self-pay | Admitting: Internal Medicine

## 2012-10-06 ENCOUNTER — Ambulatory Visit: Payer: Medicare Other

## 2012-10-06 ENCOUNTER — Ambulatory Visit: Payer: Medicare Other | Admitting: Occupational Therapy

## 2012-10-06 ENCOUNTER — Telehealth: Payer: Self-pay | Admitting: Cardiology

## 2012-10-06 NOTE — Telephone Encounter (Signed)
Pt is advised that we do not have any Crestor samples at this time. I offered to call a prescription into her pharmacy but she declined my offer and asked that Elnita Maxwell, Dr Elvis Coil nurse, call her back on Tuesday of next week. Will forward note to [place].

## 2012-10-06 NOTE — Telephone Encounter (Signed)
New Prob      Pt requesting samples of CRESTOR.

## 2012-10-10 ENCOUNTER — Ambulatory Visit: Payer: Medicare Other

## 2012-10-10 ENCOUNTER — Ambulatory Visit: Payer: Medicare Other | Admitting: Occupational Therapy

## 2012-10-10 DIAGNOSIS — R413 Other amnesia: Secondary | ICD-10-CM | POA: Diagnosis not present

## 2012-10-10 DIAGNOSIS — I10 Essential (primary) hypertension: Secondary | ICD-10-CM | POA: Diagnosis not present

## 2012-10-10 DIAGNOSIS — E1149 Type 2 diabetes mellitus with other diabetic neurological complication: Secondary | ICD-10-CM | POA: Diagnosis not present

## 2012-10-10 DIAGNOSIS — M810 Age-related osteoporosis without current pathological fracture: Secondary | ICD-10-CM | POA: Diagnosis not present

## 2012-10-10 DIAGNOSIS — I6350 Cerebral infarction due to unspecified occlusion or stenosis of unspecified cerebral artery: Secondary | ICD-10-CM | POA: Diagnosis not present

## 2012-10-10 DIAGNOSIS — M353 Polymyalgia rheumatica: Secondary | ICD-10-CM | POA: Diagnosis not present

## 2012-10-10 DIAGNOSIS — J9801 Acute bronchospasm: Secondary | ICD-10-CM | POA: Diagnosis not present

## 2012-10-10 DIAGNOSIS — R269 Unspecified abnormalities of gait and mobility: Secondary | ICD-10-CM | POA: Diagnosis not present

## 2012-10-10 NOTE — Telephone Encounter (Signed)
Returned call to patient spoke to son samples of crestor 20 mg take 2 tablets left at 3rd floor front desk.

## 2012-10-11 DIAGNOSIS — N3941 Urge incontinence: Secondary | ICD-10-CM | POA: Diagnosis not present

## 2012-10-12 ENCOUNTER — Ambulatory Visit: Payer: Medicare Other

## 2012-10-12 ENCOUNTER — Ambulatory Visit: Payer: Medicare Other | Admitting: Occupational Therapy

## 2012-10-17 ENCOUNTER — Ambulatory Visit: Payer: Medicare Other | Admitting: Occupational Therapy

## 2012-10-17 ENCOUNTER — Ambulatory Visit: Payer: Medicare Other

## 2012-10-18 DIAGNOSIS — M19049 Primary osteoarthritis, unspecified hand: Secondary | ICD-10-CM | POA: Diagnosis not present

## 2012-10-18 DIAGNOSIS — S62173A Displaced fracture of trapezium [larger multangular], unspecified wrist, initial encounter for closed fracture: Secondary | ICD-10-CM | POA: Diagnosis not present

## 2012-10-19 ENCOUNTER — Ambulatory Visit: Payer: Medicare Other | Admitting: Physical Therapy

## 2012-10-24 ENCOUNTER — Ambulatory Visit: Payer: Medicare Other

## 2012-10-24 ENCOUNTER — Ambulatory Visit: Payer: Medicare Other | Admitting: Occupational Therapy

## 2012-10-26 ENCOUNTER — Ambulatory Visit: Payer: Medicare Other | Admitting: Occupational Therapy

## 2012-10-26 ENCOUNTER — Ambulatory Visit: Payer: Medicare Other | Attending: Internal Medicine | Admitting: Physical Therapy

## 2012-10-26 DIAGNOSIS — R269 Unspecified abnormalities of gait and mobility: Secondary | ICD-10-CM | POA: Insufficient documentation

## 2012-10-26 DIAGNOSIS — Z5189 Encounter for other specified aftercare: Secondary | ICD-10-CM | POA: Diagnosis not present

## 2012-10-26 DIAGNOSIS — R279 Unspecified lack of coordination: Secondary | ICD-10-CM | POA: Diagnosis not present

## 2012-10-26 DIAGNOSIS — M6281 Muscle weakness (generalized): Secondary | ICD-10-CM | POA: Diagnosis not present

## 2012-10-26 DIAGNOSIS — Z9181 History of falling: Secondary | ICD-10-CM | POA: Insufficient documentation

## 2012-11-08 ENCOUNTER — Telehealth: Payer: Self-pay | Admitting: Cardiology

## 2012-11-08 DIAGNOSIS — M810 Age-related osteoporosis without current pathological fracture: Secondary | ICD-10-CM | POA: Diagnosis not present

## 2012-11-09 NOTE — Telephone Encounter (Signed)
Returned call to patient crestor 10 mg samples left at 3rd floor front desk.

## 2012-11-09 NOTE — Telephone Encounter (Signed)
Pt calling for samples. Tried to transfer to our medication team. Told me to forward msg to the nurse. Pt would like a call back.

## 2012-11-13 ENCOUNTER — Telehealth: Payer: Self-pay | Admitting: Cardiology

## 2012-11-13 NOTE — Telephone Encounter (Signed)
Returned call to patient spoke to husband he wanted to verify crestor dose.Husband was told patient should take crestor 20 mg daily.

## 2012-11-13 NOTE — Telephone Encounter (Signed)
New Problem  Pt request the clarification for dosage of Crestor... . Please advise.

## 2012-11-21 DIAGNOSIS — M899 Disorder of bone, unspecified: Secondary | ICD-10-CM | POA: Diagnosis not present

## 2012-11-30 ENCOUNTER — Telehealth: Payer: Self-pay | Admitting: Cardiology

## 2012-11-30 ENCOUNTER — Telehealth: Payer: Self-pay | Admitting: *Deleted

## 2012-11-30 NOTE — Telephone Encounter (Signed)
Patient called requesting Crestor samples. We only had 7 tablets available. Left a front desk. Patient aware.

## 2012-12-06 DIAGNOSIS — M19049 Primary osteoarthritis, unspecified hand: Secondary | ICD-10-CM | POA: Diagnosis not present

## 2012-12-06 DIAGNOSIS — S62173A Displaced fracture of trapezium [larger multangular], unspecified wrist, initial encounter for closed fracture: Secondary | ICD-10-CM | POA: Diagnosis not present

## 2012-12-12 DIAGNOSIS — R252 Cramp and spasm: Secondary | ICD-10-CM | POA: Diagnosis not present

## 2012-12-12 DIAGNOSIS — Z23 Encounter for immunization: Secondary | ICD-10-CM | POA: Diagnosis not present

## 2012-12-12 DIAGNOSIS — M353 Polymyalgia rheumatica: Secondary | ICD-10-CM | POA: Diagnosis not present

## 2012-12-12 DIAGNOSIS — R269 Unspecified abnormalities of gait and mobility: Secondary | ICD-10-CM | POA: Diagnosis not present

## 2012-12-12 DIAGNOSIS — I6350 Cerebral infarction due to unspecified occlusion or stenosis of unspecified cerebral artery: Secondary | ICD-10-CM | POA: Diagnosis not present

## 2012-12-12 DIAGNOSIS — E785 Hyperlipidemia, unspecified: Secondary | ICD-10-CM | POA: Diagnosis not present

## 2012-12-12 DIAGNOSIS — E1149 Type 2 diabetes mellitus with other diabetic neurological complication: Secondary | ICD-10-CM | POA: Diagnosis not present

## 2012-12-12 DIAGNOSIS — E119 Type 2 diabetes mellitus without complications: Secondary | ICD-10-CM | POA: Diagnosis not present

## 2012-12-12 DIAGNOSIS — Z1331 Encounter for screening for depression: Secondary | ICD-10-CM | POA: Diagnosis not present

## 2012-12-12 DIAGNOSIS — E039 Hypothyroidism, unspecified: Secondary | ICD-10-CM | POA: Diagnosis not present

## 2012-12-28 ENCOUNTER — Other Ambulatory Visit: Payer: Self-pay

## 2013-01-04 ENCOUNTER — Telehealth: Payer: Self-pay

## 2013-01-04 NOTE — Telephone Encounter (Signed)
Samples

## 2013-02-01 DIAGNOSIS — M25519 Pain in unspecified shoulder: Secondary | ICD-10-CM | POA: Diagnosis not present

## 2013-02-01 DIAGNOSIS — M5137 Other intervertebral disc degeneration, lumbosacral region: Secondary | ICD-10-CM | POA: Diagnosis not present

## 2013-02-01 DIAGNOSIS — M353 Polymyalgia rheumatica: Secondary | ICD-10-CM | POA: Diagnosis not present

## 2013-02-01 DIAGNOSIS — M25569 Pain in unspecified knee: Secondary | ICD-10-CM | POA: Diagnosis not present

## 2013-03-02 DIAGNOSIS — D1801 Hemangioma of skin and subcutaneous tissue: Secondary | ICD-10-CM | POA: Diagnosis not present

## 2013-03-02 DIAGNOSIS — D239 Other benign neoplasm of skin, unspecified: Secondary | ICD-10-CM | POA: Diagnosis not present

## 2013-03-02 DIAGNOSIS — L570 Actinic keratosis: Secondary | ICD-10-CM | POA: Diagnosis not present

## 2013-03-02 DIAGNOSIS — L819 Disorder of pigmentation, unspecified: Secondary | ICD-10-CM | POA: Diagnosis not present

## 2013-03-02 DIAGNOSIS — Z6837 Body mass index (BMI) 37.0-37.9, adult: Secondary | ICD-10-CM | POA: Diagnosis not present

## 2013-03-02 DIAGNOSIS — R0602 Shortness of breath: Secondary | ICD-10-CM | POA: Diagnosis not present

## 2013-03-02 DIAGNOSIS — L820 Inflamed seborrheic keratosis: Secondary | ICD-10-CM | POA: Diagnosis not present

## 2013-03-02 DIAGNOSIS — R059 Cough, unspecified: Secondary | ICD-10-CM | POA: Diagnosis not present

## 2013-03-02 DIAGNOSIS — J9801 Acute bronchospasm: Secondary | ICD-10-CM | POA: Diagnosis not present

## 2013-03-02 DIAGNOSIS — Z85828 Personal history of other malignant neoplasm of skin: Secondary | ICD-10-CM | POA: Diagnosis not present

## 2013-03-02 DIAGNOSIS — D692 Other nonthrombocytopenic purpura: Secondary | ICD-10-CM | POA: Diagnosis not present

## 2013-03-02 DIAGNOSIS — R05 Cough: Secondary | ICD-10-CM | POA: Diagnosis not present

## 2013-03-02 DIAGNOSIS — L821 Other seborrheic keratosis: Secondary | ICD-10-CM | POA: Diagnosis not present

## 2013-03-06 DIAGNOSIS — M949 Disorder of cartilage, unspecified: Secondary | ICD-10-CM | POA: Diagnosis not present

## 2013-03-06 DIAGNOSIS — M899 Disorder of bone, unspecified: Secondary | ICD-10-CM | POA: Diagnosis not present

## 2013-03-06 DIAGNOSIS — M25519 Pain in unspecified shoulder: Secondary | ICD-10-CM | POA: Diagnosis not present

## 2013-03-09 ENCOUNTER — Telehealth: Payer: Self-pay | Admitting: *Deleted

## 2013-03-09 NOTE — Telephone Encounter (Signed)
Patient requests crestor samples. She is aware that they will be left at the front desk for pick up. 

## 2013-03-15 DIAGNOSIS — M1710 Unilateral primary osteoarthritis, unspecified knee: Secondary | ICD-10-CM | POA: Diagnosis not present

## 2013-03-15 DIAGNOSIS — M25469 Effusion, unspecified knee: Secondary | ICD-10-CM | POA: Diagnosis not present

## 2013-03-15 DIAGNOSIS — M25519 Pain in unspecified shoulder: Secondary | ICD-10-CM | POA: Diagnosis not present

## 2013-03-15 DIAGNOSIS — Z96659 Presence of unspecified artificial knee joint: Secondary | ICD-10-CM | POA: Diagnosis not present

## 2013-03-15 DIAGNOSIS — M25569 Pain in unspecified knee: Secondary | ICD-10-CM | POA: Diagnosis not present

## 2013-03-16 DIAGNOSIS — S62173A Displaced fracture of trapezium [larger multangular], unspecified wrist, initial encounter for closed fracture: Secondary | ICD-10-CM | POA: Diagnosis not present

## 2013-03-19 DIAGNOSIS — G589 Mononeuropathy, unspecified: Secondary | ICD-10-CM | POA: Diagnosis not present

## 2013-03-19 DIAGNOSIS — I10 Essential (primary) hypertension: Secondary | ICD-10-CM | POA: Diagnosis not present

## 2013-03-19 DIAGNOSIS — E1149 Type 2 diabetes mellitus with other diabetic neurological complication: Secondary | ICD-10-CM | POA: Diagnosis not present

## 2013-03-19 DIAGNOSIS — E039 Hypothyroidism, unspecified: Secondary | ICD-10-CM | POA: Diagnosis not present

## 2013-03-19 DIAGNOSIS — T148XXA Other injury of unspecified body region, initial encounter: Secondary | ICD-10-CM | POA: Diagnosis not present

## 2013-03-19 DIAGNOSIS — E119 Type 2 diabetes mellitus without complications: Secondary | ICD-10-CM | POA: Diagnosis not present

## 2013-03-19 DIAGNOSIS — Z23 Encounter for immunization: Secondary | ICD-10-CM | POA: Diagnosis not present

## 2013-03-19 DIAGNOSIS — E785 Hyperlipidemia, unspecified: Secondary | ICD-10-CM | POA: Diagnosis not present

## 2013-03-20 DIAGNOSIS — M25519 Pain in unspecified shoulder: Secondary | ICD-10-CM | POA: Diagnosis not present

## 2013-03-21 DIAGNOSIS — R29898 Other symptoms and signs involving the musculoskeletal system: Secondary | ICD-10-CM | POA: Diagnosis not present

## 2013-03-21 DIAGNOSIS — M25519 Pain in unspecified shoulder: Secondary | ICD-10-CM | POA: Diagnosis not present

## 2013-03-21 DIAGNOSIS — M25569 Pain in unspecified knee: Secondary | ICD-10-CM | POA: Diagnosis not present

## 2013-03-21 DIAGNOSIS — M25619 Stiffness of unspecified shoulder, not elsewhere classified: Secondary | ICD-10-CM | POA: Diagnosis not present

## 2013-03-28 DIAGNOSIS — M25519 Pain in unspecified shoulder: Secondary | ICD-10-CM | POA: Diagnosis not present

## 2013-03-28 DIAGNOSIS — M25619 Stiffness of unspecified shoulder, not elsewhere classified: Secondary | ICD-10-CM | POA: Diagnosis not present

## 2013-03-28 DIAGNOSIS — M25569 Pain in unspecified knee: Secondary | ICD-10-CM | POA: Diagnosis not present

## 2013-03-28 DIAGNOSIS — R29898 Other symptoms and signs involving the musculoskeletal system: Secondary | ICD-10-CM | POA: Diagnosis not present

## 2013-04-04 ENCOUNTER — Telehealth: Payer: Self-pay | Admitting: *Deleted

## 2013-04-04 DIAGNOSIS — N3941 Urge incontinence: Secondary | ICD-10-CM | POA: Diagnosis not present

## 2013-04-04 NOTE — Telephone Encounter (Signed)
Patient requests crestor samples. She is aware that they will be left at the front desk for pick up. 

## 2013-04-17 DIAGNOSIS — M25519 Pain in unspecified shoulder: Secondary | ICD-10-CM | POA: Diagnosis not present

## 2013-05-07 ENCOUNTER — Telehealth: Payer: Self-pay

## 2013-05-07 NOTE — Telephone Encounter (Signed)
Patient called wanting samples of crestor placed samples up front

## 2013-06-04 DIAGNOSIS — I10 Essential (primary) hypertension: Secondary | ICD-10-CM | POA: Diagnosis not present

## 2013-06-04 DIAGNOSIS — E039 Hypothyroidism, unspecified: Secondary | ICD-10-CM | POA: Diagnosis not present

## 2013-06-04 DIAGNOSIS — E119 Type 2 diabetes mellitus without complications: Secondary | ICD-10-CM | POA: Diagnosis not present

## 2013-06-04 DIAGNOSIS — E785 Hyperlipidemia, unspecified: Secondary | ICD-10-CM | POA: Diagnosis not present

## 2013-06-04 DIAGNOSIS — E1149 Type 2 diabetes mellitus with other diabetic neurological complication: Secondary | ICD-10-CM | POA: Diagnosis not present

## 2013-06-04 DIAGNOSIS — R269 Unspecified abnormalities of gait and mobility: Secondary | ICD-10-CM | POA: Diagnosis not present

## 2013-06-04 DIAGNOSIS — M810 Age-related osteoporosis without current pathological fracture: Secondary | ICD-10-CM | POA: Diagnosis not present

## 2013-06-04 DIAGNOSIS — I6990 Unspecified sequelae of unspecified cerebrovascular disease: Secondary | ICD-10-CM | POA: Diagnosis not present

## 2013-06-06 NOTE — Telephone Encounter (Signed)
error 

## 2013-06-15 DIAGNOSIS — R809 Proteinuria, unspecified: Secondary | ICD-10-CM | POA: Diagnosis not present

## 2013-06-15 DIAGNOSIS — R300 Dysuria: Secondary | ICD-10-CM | POA: Diagnosis not present

## 2013-06-20 ENCOUNTER — Ambulatory Visit: Payer: Medicare Other | Attending: Internal Medicine | Admitting: Physical Therapy

## 2013-06-20 DIAGNOSIS — IMO0001 Reserved for inherently not codable concepts without codable children: Secondary | ICD-10-CM | POA: Insufficient documentation

## 2013-06-20 DIAGNOSIS — R269 Unspecified abnormalities of gait and mobility: Secondary | ICD-10-CM | POA: Diagnosis not present

## 2013-06-20 DIAGNOSIS — M6281 Muscle weakness (generalized): Secondary | ICD-10-CM | POA: Insufficient documentation

## 2013-06-27 ENCOUNTER — Ambulatory Visit: Payer: Medicare Other | Attending: Internal Medicine | Admitting: Physical Therapy

## 2013-06-27 DIAGNOSIS — M6281 Muscle weakness (generalized): Secondary | ICD-10-CM | POA: Insufficient documentation

## 2013-06-27 DIAGNOSIS — R269 Unspecified abnormalities of gait and mobility: Secondary | ICD-10-CM | POA: Insufficient documentation

## 2013-06-27 DIAGNOSIS — IMO0001 Reserved for inherently not codable concepts without codable children: Secondary | ICD-10-CM | POA: Insufficient documentation

## 2013-06-28 ENCOUNTER — Other Ambulatory Visit: Payer: Self-pay

## 2013-06-28 MED ORDER — ROSUVASTATIN CALCIUM 40 MG PO TABS: 40.0000 mg | ORAL_TABLET | Freq: Every day | ORAL | Status: DC

## 2013-06-29 ENCOUNTER — Ambulatory Visit: Payer: Medicare Other | Admitting: Physical Therapy

## 2013-06-29 ENCOUNTER — Telehealth: Payer: Self-pay | Admitting: *Deleted

## 2013-06-29 NOTE — Telephone Encounter (Signed)
Patient requests crestor samples. I will place them at the front desk.

## 2013-07-03 ENCOUNTER — Ambulatory Visit: Payer: Medicare Other | Admitting: Physical Therapy

## 2013-07-04 ENCOUNTER — Ambulatory Visit: Payer: Medicare Other | Admitting: Physical Therapy

## 2013-07-04 DIAGNOSIS — Z6836 Body mass index (BMI) 36.0-36.9, adult: Secondary | ICD-10-CM | POA: Diagnosis not present

## 2013-07-04 DIAGNOSIS — M810 Age-related osteoporosis without current pathological fracture: Secondary | ICD-10-CM | POA: Diagnosis not present

## 2013-07-09 ENCOUNTER — Ambulatory Visit: Payer: Medicare Other | Admitting: Physical Therapy

## 2013-07-09 ENCOUNTER — Telehealth: Payer: Self-pay | Admitting: *Deleted

## 2013-07-09 NOTE — Telephone Encounter (Signed)
Patient calling for samples, we are presently very low on crestor samples  Encouraged patient to apply for patient assistance with her crestor, will mail her forms today and get DR [place] to sign provider script.  [place].

## 2013-07-09 NOTE — Telephone Encounter (Signed)
[place] and [place] provider information with medication script faxed to [phone].

## 2013-07-10 ENCOUNTER — Telehealth: Payer: Self-pay

## 2013-07-10 NOTE — Telephone Encounter (Signed)
Patient called no answer.Left message on personal voice mail received message you wanted clarification on crestor dose.Advised to return my call.

## 2013-07-11 ENCOUNTER — Ambulatory Visit: Payer: Medicare Other | Admitting: Physical Therapy

## 2013-07-13 ENCOUNTER — Telehealth: Payer: Self-pay

## 2013-07-13 ENCOUNTER — Other Ambulatory Visit: Payer: Self-pay

## 2013-07-13 NOTE — Telephone Encounter (Signed)
Spoke to patient she is taking crestor 20 mg daily.Stated crestor cost too much.Stated she received information from [place] and will be sending information back to her for patient assistance program.

## 2013-07-17 ENCOUNTER — Telehealth: Payer: Self-pay

## 2013-07-17 ENCOUNTER — Ambulatory Visit: Payer: Medicare Other | Admitting: Physical Therapy

## 2013-07-17 DIAGNOSIS — F0630 Mood disorder due to known physiological condition, unspecified: Secondary | ICD-10-CM | POA: Diagnosis not present

## 2013-07-17 NOTE — Telephone Encounter (Signed)
Patient came to office to get samples of crestor gave them to her at the front desk

## 2013-07-18 ENCOUNTER — Ambulatory Visit: Payer: Medicare Other | Admitting: Physical Therapy

## 2013-07-19 ENCOUNTER — Ambulatory Visit: Payer: Medicare Other | Admitting: Physical Therapy

## 2013-07-19 ENCOUNTER — Telehealth: Payer: Self-pay | Admitting: *Deleted

## 2013-07-19 NOTE — Telephone Encounter (Signed)
[place] CRESTOR  with patient in office, faxed to [place] and [place] this,  they have already received the script and provider information I faxed earlier (confirmed)

## 2013-07-30 DIAGNOSIS — F0630 Mood disorder due to known physiological condition, unspecified: Secondary | ICD-10-CM | POA: Diagnosis not present

## 2013-08-07 DIAGNOSIS — M1710 Unilateral primary osteoarthritis, unspecified knee: Secondary | ICD-10-CM | POA: Diagnosis not present

## 2013-08-07 DIAGNOSIS — M19049 Primary osteoarthritis, unspecified hand: Secondary | ICD-10-CM | POA: Diagnosis not present

## 2013-08-07 DIAGNOSIS — IMO0002 Reserved for concepts with insufficient information to code with codable children: Secondary | ICD-10-CM | POA: Diagnosis not present

## 2013-08-07 DIAGNOSIS — M353 Polymyalgia rheumatica: Secondary | ICD-10-CM | POA: Diagnosis not present

## 2013-08-09 DIAGNOSIS — F0630 Mood disorder due to known physiological condition, unspecified: Secondary | ICD-10-CM | POA: Diagnosis not present

## 2013-08-21 DIAGNOSIS — F0630 Mood disorder due to known physiological condition, unspecified: Secondary | ICD-10-CM | POA: Diagnosis not present

## 2013-08-29 ENCOUNTER — Ambulatory Visit: Payer: Medicare Other | Admitting: Cardiology

## 2013-09-04 DIAGNOSIS — F3289 Other specified depressive episodes: Secondary | ICD-10-CM | POA: Diagnosis not present

## 2013-09-04 DIAGNOSIS — I6990 Unspecified sequelae of unspecified cerebrovascular disease: Secondary | ICD-10-CM | POA: Diagnosis not present

## 2013-09-04 DIAGNOSIS — E785 Hyperlipidemia, unspecified: Secondary | ICD-10-CM | POA: Diagnosis not present

## 2013-09-04 DIAGNOSIS — F329 Major depressive disorder, single episode, unspecified: Secondary | ICD-10-CM | POA: Diagnosis not present

## 2013-09-04 DIAGNOSIS — E039 Hypothyroidism, unspecified: Secondary | ICD-10-CM | POA: Diagnosis not present

## 2013-09-04 DIAGNOSIS — R413 Other amnesia: Secondary | ICD-10-CM | POA: Diagnosis not present

## 2013-09-04 DIAGNOSIS — M1990 Unspecified osteoarthritis, unspecified site: Secondary | ICD-10-CM | POA: Diagnosis not present

## 2013-09-04 DIAGNOSIS — I10 Essential (primary) hypertension: Secondary | ICD-10-CM | POA: Diagnosis not present

## 2013-09-04 DIAGNOSIS — Z1331 Encounter for screening for depression: Secondary | ICD-10-CM | POA: Diagnosis not present

## 2013-09-04 DIAGNOSIS — E1149 Type 2 diabetes mellitus with other diabetic neurological complication: Secondary | ICD-10-CM | POA: Diagnosis not present

## 2013-09-13 DIAGNOSIS — F39 Unspecified mood [affective] disorder: Secondary | ICD-10-CM | POA: Diagnosis not present

## 2013-09-25 DIAGNOSIS — H2510 Age-related nuclear cataract, unspecified eye: Secondary | ICD-10-CM | POA: Diagnosis not present

## 2013-09-25 DIAGNOSIS — E119 Type 2 diabetes mellitus without complications: Secondary | ICD-10-CM | POA: Diagnosis not present

## 2013-10-03 DIAGNOSIS — N3941 Urge incontinence: Secondary | ICD-10-CM | POA: Diagnosis not present

## 2013-10-17 DIAGNOSIS — M1710 Unilateral primary osteoarthritis, unspecified knee: Secondary | ICD-10-CM | POA: Diagnosis not present

## 2013-10-23 DIAGNOSIS — R3129 Other microscopic hematuria: Secondary | ICD-10-CM | POA: Diagnosis not present

## 2013-10-24 DIAGNOSIS — M1710 Unilateral primary osteoarthritis, unspecified knee: Secondary | ICD-10-CM | POA: Diagnosis not present

## 2013-10-31 DIAGNOSIS — M1710 Unilateral primary osteoarthritis, unspecified knee: Secondary | ICD-10-CM | POA: Diagnosis not present

## 2013-11-01 DIAGNOSIS — N3946 Mixed incontinence: Secondary | ICD-10-CM | POA: Diagnosis not present

## 2013-11-01 DIAGNOSIS — N3941 Urge incontinence: Secondary | ICD-10-CM | POA: Diagnosis not present

## 2013-11-02 DIAGNOSIS — Z86010 Personal history of colonic polyps: Secondary | ICD-10-CM | POA: Diagnosis not present

## 2013-11-02 DIAGNOSIS — D126 Benign neoplasm of colon, unspecified: Secondary | ICD-10-CM | POA: Diagnosis not present

## 2013-11-02 DIAGNOSIS — Z09 Encounter for follow-up examination after completed treatment for conditions other than malignant neoplasm: Secondary | ICD-10-CM | POA: Diagnosis not present

## 2013-11-06 DIAGNOSIS — F39 Unspecified mood [affective] disorder: Secondary | ICD-10-CM | POA: Diagnosis not present

## 2013-11-08 DIAGNOSIS — N3941 Urge incontinence: Secondary | ICD-10-CM | POA: Diagnosis not present

## 2013-11-15 DIAGNOSIS — F39 Unspecified mood [affective] disorder: Secondary | ICD-10-CM | POA: Diagnosis not present

## 2013-11-15 DIAGNOSIS — N3941 Urge incontinence: Secondary | ICD-10-CM | POA: Diagnosis not present

## 2013-11-22 ENCOUNTER — Encounter: Payer: Self-pay | Admitting: Cardiology

## 2013-11-22 ENCOUNTER — Ambulatory Visit (INDEPENDENT_AMBULATORY_CARE_PROVIDER_SITE_OTHER): Payer: Medicare Other | Admitting: Cardiology

## 2013-11-22 VITALS — BP 130/90 | HR 79 | Ht 62.0 in | Wt 213.0 lb

## 2013-11-22 DIAGNOSIS — I452 Bifascicular block: Secondary | ICD-10-CM | POA: Diagnosis not present

## 2013-11-22 DIAGNOSIS — I10 Essential (primary) hypertension: Secondary | ICD-10-CM

## 2013-11-22 DIAGNOSIS — N3941 Urge incontinence: Secondary | ICD-10-CM | POA: Diagnosis not present

## 2013-11-22 DIAGNOSIS — E785 Hyperlipidemia, unspecified: Secondary | ICD-10-CM

## 2013-11-22 NOTE — Progress Notes (Signed)
[name] Date of Birth: [birth year]   History of Present Illness: [name] is seen for yearly followup. She has a history of hypertension, diabetes mellitus, and hyperlipidemia. She has a chronic bifascicular block. She has been asymptomatic. She denies any dizziness, syncope, palpitations, or shortness of breath. She did have a CVA in Feb 2014. TEE was unremarkable. She was switched from ASA to plavix. She does complain of chronic hoarseness since her TEE.   Current Outpatient Prescriptions on File Prior to Visit  Medication Sig Dispense Refill  . acetaminophen (TYLENOL) 325 MG tablet Take 2 tablets (650 mg total) by mouth every 4 (four) hours as needed.      . benazepril (LOTENSIN) 40 MG tablet Take 40 mg by mouth daily.      . Cholecalciferol 5000 UNITS capsule Take 1 capsule (5,000 Units total) by mouth daily.  30 capsule  1  . clopidogrel (PLAVIX) 75 MG tablet Take 1 tablet (75 mg total) by mouth daily with breakfast.  30 tablet  0  . donepezil (ARICEPT) 5 MG tablet Take 1 tablet (5 mg total) by mouth at bedtime.  30 tablet  1  . Fluticasone-Salmeterol (ADVAIR DISKUS IN) Inhale 1 tablet into the lungs as needed (shortness of breath).      . folic acid (FOLVITE) 1 MG tablet Take 1 tablet (1 mg total) by mouth 2 (two) times daily.  60 tablet  1  . glimepiride (AMARYL) 2 MG tablet Take 1 tablet (2 mg total) by mouth daily with breakfast.  30 tablet  1  . levothyroxine (SYNTHROID, LEVOTHROID) 100 MCG tablet Take 1 tablet (100 mcg total) by mouth daily.  30 tablet  1  . rosuvastatin (CRESTOR) 20 MG tablet Take 1 tablet (20 mg total) by mouth daily.  30 tablet  6  . sertraline (ZOLOFT) 100 MG tablet Take 200 mg by mouth daily.      . vitamin B-12 (CYANOCOBALAMIN) 1000 MCG tablet Take 1 tablet (1,000 mcg total) by mouth daily.  30 tablet  1   No current facility-administered medications on file prior to visit.    Allergies  Allergen Reactions  . Adhesive [Tape]     Pulls off  skin    Past Medical History  Diagnosis Date  . Hyperlipidemia   . Hypertension   . Obstructive sleep apnea   . Bifascicular block   . Diabetes mellitus   . Thyroid disease   . Hypothyroidism   . Gastric ulcer   . Squamous cell skin cancer   . Shortness of breath   . Stroke 03/31/2012  . GERD (gastroesophageal reflux disease)   . Kidney stones     HX OF  . Arthritis     Past Surgical History  Procedure Laterality Date  . Total knee arthroplasty      Left knee  . Tonsillectomy    . Appendectomy    . Hernia repair    . Rotator cuff repair      RT SHOULDER  . Wrist fracture surgery    . Tee without cardioversion N/A 09/12/2012    Procedure: TRANSESOPHAGEAL ECHOCARDIOGRAM (TEE);  Surgeon: Lelon Perla, MD;  Location: [facility] ENDOSCOPY;  Service: Cardiovascular;  Laterality: N/A;    History  Smoking status  . Former Smoker  . Quit date: 11/02/2004  Smokeless tobacco  . Never Used    History  Alcohol Use  . Yes    Comment: seldom    Family History  Problem Relation Age  of Onset  . Heart failure Mother     Review of Systems: As noted in HPI. All other systems were reviewed and are negative.  Physical Exam: BP 130/90  Pulse 79  Ht 5\' 2"  (1.575 m)  Wt 213 lb (96.616 kg)  BMI 38.95 kg/m2 She is an obese white female in no acute distress. She is normocephalic, atraumatic. Pupils are equal round and reactive to light and accommodation. Extraocular movements are full. Oropharynx is clear. Neck is supple without JVD, adenopathy, thyromegaly, or bruits. Lungs are clear. Cardiac exam reveals a grade 1/6 systolic murmur at the left sternal border. Abdomen is obese, soft, nontender. Extremities are without edema. Pedal pulses are palpable. Neurologic exam is nonfocal.  LABORATORY DATA: Ecg: NSR with occ. PVCs. RBBB. LAD.  Assessment / Plan: 1. Bifascicular block. Asymptomatic. No change.  2. History of CVA on plavix.  3. HTN  4. Hoarseness. I doubt this is  related to prior TEE. Should be evaluated by ENT.

## 2013-11-22 NOTE — Patient Instructions (Signed)
Continue your current therapy  I will see you in one year   

## 2013-11-29 DIAGNOSIS — N3941 Urge incontinence: Secondary | ICD-10-CM | POA: Diagnosis not present

## 2013-12-03 DIAGNOSIS — F329 Major depressive disorder, single episode, unspecified: Secondary | ICD-10-CM | POA: Diagnosis not present

## 2013-12-03 DIAGNOSIS — Z6837 Body mass index (BMI) 37.0-37.9, adult: Secondary | ICD-10-CM | POA: Diagnosis not present

## 2013-12-03 DIAGNOSIS — R05 Cough: Secondary | ICD-10-CM | POA: Diagnosis not present

## 2013-12-03 DIAGNOSIS — J45909 Unspecified asthma, uncomplicated: Secondary | ICD-10-CM | POA: Diagnosis not present

## 2013-12-19 DIAGNOSIS — F39 Unspecified mood [affective] disorder: Secondary | ICD-10-CM | POA: Diagnosis not present

## 2013-12-25 DIAGNOSIS — Z6837 Body mass index (BMI) 37.0-37.9, adult: Secondary | ICD-10-CM | POA: Diagnosis not present

## 2013-12-25 DIAGNOSIS — L209 Atopic dermatitis, unspecified: Secondary | ICD-10-CM | POA: Diagnosis not present

## 2013-12-27 DIAGNOSIS — N3941 Urge incontinence: Secondary | ICD-10-CM | POA: Diagnosis not present

## 2014-01-03 DIAGNOSIS — F39 Unspecified mood [affective] disorder: Secondary | ICD-10-CM | POA: Diagnosis not present

## 2014-01-03 DIAGNOSIS — L209 Atopic dermatitis, unspecified: Secondary | ICD-10-CM | POA: Diagnosis not present

## 2014-01-03 DIAGNOSIS — M353 Polymyalgia rheumatica: Secondary | ICD-10-CM | POA: Diagnosis not present

## 2014-01-03 DIAGNOSIS — E039 Hypothyroidism, unspecified: Secondary | ICD-10-CM | POA: Diagnosis not present

## 2014-01-03 DIAGNOSIS — R454 Irritability and anger: Secondary | ICD-10-CM | POA: Diagnosis not present

## 2014-01-03 DIAGNOSIS — R413 Other amnesia: Secondary | ICD-10-CM | POA: Diagnosis not present

## 2014-01-03 DIAGNOSIS — I10 Essential (primary) hypertension: Secondary | ICD-10-CM | POA: Diagnosis not present

## 2014-01-03 DIAGNOSIS — E1140 Type 2 diabetes mellitus with diabetic neuropathy, unspecified: Secondary | ICD-10-CM | POA: Diagnosis not present

## 2014-01-03 DIAGNOSIS — Z6837 Body mass index (BMI) 37.0-37.9, adult: Secondary | ICD-10-CM | POA: Diagnosis not present

## 2014-01-03 DIAGNOSIS — Z23 Encounter for immunization: Secondary | ICD-10-CM | POA: Diagnosis not present

## 2014-01-03 DIAGNOSIS — R05 Cough: Secondary | ICD-10-CM | POA: Diagnosis not present

## 2014-01-03 DIAGNOSIS — E785 Hyperlipidemia, unspecified: Secondary | ICD-10-CM | POA: Diagnosis not present

## 2014-01-03 DIAGNOSIS — M1990 Unspecified osteoarthritis, unspecified site: Secondary | ICD-10-CM | POA: Diagnosis not present

## 2014-01-08 DIAGNOSIS — Z6837 Body mass index (BMI) 37.0-37.9, adult: Secondary | ICD-10-CM | POA: Diagnosis not present

## 2014-01-08 DIAGNOSIS — M810 Age-related osteoporosis without current pathological fracture: Secondary | ICD-10-CM | POA: Diagnosis not present

## 2014-01-10 DIAGNOSIS — N3941 Urge incontinence: Secondary | ICD-10-CM | POA: Diagnosis not present

## 2014-01-24 DIAGNOSIS — N3941 Urge incontinence: Secondary | ICD-10-CM | POA: Diagnosis not present

## 2014-01-25 DIAGNOSIS — F332 Major depressive disorder, recurrent severe without psychotic features: Secondary | ICD-10-CM | POA: Diagnosis not present

## 2014-01-31 DIAGNOSIS — N3941 Urge incontinence: Secondary | ICD-10-CM | POA: Diagnosis not present

## 2014-02-04 ENCOUNTER — Ambulatory Visit: Payer: PRIVATE HEALTH INSURANCE | Admitting: Podiatry

## 2014-02-04 DIAGNOSIS — F39 Unspecified mood [affective] disorder: Secondary | ICD-10-CM | POA: Diagnosis not present

## 2014-02-05 DIAGNOSIS — M25562 Pain in left knee: Secondary | ICD-10-CM | POA: Diagnosis not present

## 2014-02-07 DIAGNOSIS — L820 Inflamed seborrheic keratosis: Secondary | ICD-10-CM | POA: Diagnosis not present

## 2014-02-07 DIAGNOSIS — Z85828 Personal history of other malignant neoplasm of skin: Secondary | ICD-10-CM | POA: Diagnosis not present

## 2014-02-07 DIAGNOSIS — D225 Melanocytic nevi of trunk: Secondary | ICD-10-CM | POA: Diagnosis not present

## 2014-02-07 DIAGNOSIS — L918 Other hypertrophic disorders of the skin: Secondary | ICD-10-CM | POA: Diagnosis not present

## 2014-02-07 DIAGNOSIS — F332 Major depressive disorder, recurrent severe without psychotic features: Secondary | ICD-10-CM | POA: Diagnosis not present

## 2014-02-18 ENCOUNTER — Telehealth: Payer: Self-pay | Admitting: Cardiology

## 2014-02-18 MED ORDER — ROSUVASTATIN CALCIUM 20 MG PO TABS: 20.0000 mg | ORAL_TABLET | Freq: Every day | ORAL | Status: DC

## 2014-02-18 NOTE — Telephone Encounter (Signed)
Returned call to patient she stated she would like samples of crestor.Crestor 5 mg samples take 4 tablets to equal 20 mg left at front desk of [facility] office.Refill sent to pharmacy.

## 2014-02-18 NOTE — Telephone Encounter (Signed)
Pt called in wanting to speak with [name], she did not specify the reason for her calling after inquiring. Please return call  Thanks

## 2014-02-19 ENCOUNTER — Telehealth: Payer: Self-pay

## 2014-02-19 NOTE — Telephone Encounter (Signed)
Patient came to office to pick up samples of crestor gave to her up front

## 2014-02-28 DIAGNOSIS — F332 Major depressive disorder, recurrent severe without psychotic features: Secondary | ICD-10-CM | POA: Diagnosis not present

## 2014-03-18 ENCOUNTER — Telehealth: Payer: Self-pay | Admitting: Cardiology

## 2014-03-18 NOTE — Telephone Encounter (Signed)
Returned call to patient crestor 20 mg samples left at front desk of [facility] office.

## 2014-03-18 NOTE — Telephone Encounter (Signed)
Pt need some samples of Crestor please.

## 2014-03-21 DIAGNOSIS — F332 Major depressive disorder, recurrent severe without psychotic features: Secondary | ICD-10-CM | POA: Diagnosis not present

## 2014-04-01 DIAGNOSIS — F332 Major depressive disorder, recurrent severe without psychotic features: Secondary | ICD-10-CM | POA: Diagnosis not present

## 2014-04-10 DIAGNOSIS — N3941 Urge incontinence: Secondary | ICD-10-CM | POA: Diagnosis not present

## 2014-04-10 DIAGNOSIS — R351 Nocturia: Secondary | ICD-10-CM | POA: Diagnosis not present

## 2014-04-15 ENCOUNTER — Telehealth: Payer: Self-pay

## 2014-04-15 NOTE — Telephone Encounter (Signed)
Received a call from patient requesting crestor samples.Crestor 20 mg samples left at front desk of [facility] office.

## 2014-04-18 DIAGNOSIS — M1990 Unspecified osteoarthritis, unspecified site: Secondary | ICD-10-CM | POA: Diagnosis not present

## 2014-04-18 DIAGNOSIS — R32 Unspecified urinary incontinence: Secondary | ICD-10-CM | POA: Diagnosis not present

## 2014-04-18 DIAGNOSIS — Z6838 Body mass index (BMI) 38.0-38.9, adult: Secondary | ICD-10-CM | POA: Diagnosis not present

## 2014-04-18 DIAGNOSIS — R600 Localized edema: Secondary | ICD-10-CM | POA: Diagnosis not present

## 2014-04-18 DIAGNOSIS — R269 Unspecified abnormalities of gait and mobility: Secondary | ICD-10-CM | POA: Diagnosis not present

## 2014-04-18 DIAGNOSIS — F329 Major depressive disorder, single episode, unspecified: Secondary | ICD-10-CM | POA: Diagnosis not present

## 2014-04-18 DIAGNOSIS — R454 Irritability and anger: Secondary | ICD-10-CM | POA: Diagnosis not present

## 2014-04-18 DIAGNOSIS — E1140 Type 2 diabetes mellitus with diabetic neuropathy, unspecified: Secondary | ICD-10-CM | POA: Diagnosis not present

## 2014-04-18 DIAGNOSIS — E119 Type 2 diabetes mellitus without complications: Secondary | ICD-10-CM | POA: Diagnosis not present

## 2014-04-18 DIAGNOSIS — R413 Other amnesia: Secondary | ICD-10-CM | POA: Diagnosis not present

## 2014-04-18 DIAGNOSIS — I6990 Unspecified sequelae of unspecified cerebrovascular disease: Secondary | ICD-10-CM | POA: Diagnosis not present

## 2014-04-18 DIAGNOSIS — Z1389 Encounter for screening for other disorder: Secondary | ICD-10-CM | POA: Diagnosis not present

## 2014-04-22 DIAGNOSIS — F332 Major depressive disorder, recurrent severe without psychotic features: Secondary | ICD-10-CM | POA: Diagnosis not present

## 2014-04-24 DIAGNOSIS — F3181 Bipolar II disorder: Secondary | ICD-10-CM | POA: Diagnosis not present

## 2014-04-24 DIAGNOSIS — M19079 Primary osteoarthritis, unspecified ankle and foot: Secondary | ICD-10-CM | POA: Diagnosis not present

## 2014-04-24 DIAGNOSIS — E1140 Type 2 diabetes mellitus with diabetic neuropathy, unspecified: Secondary | ICD-10-CM | POA: Diagnosis not present

## 2014-04-24 DIAGNOSIS — M779 Enthesopathy, unspecified: Secondary | ICD-10-CM | POA: Diagnosis not present

## 2014-05-01 DIAGNOSIS — N3946 Mixed incontinence: Secondary | ICD-10-CM | POA: Diagnosis not present

## 2014-05-01 DIAGNOSIS — N3944 Nocturnal enuresis: Secondary | ICD-10-CM | POA: Diagnosis not present

## 2014-05-02 DIAGNOSIS — D225 Melanocytic nevi of trunk: Secondary | ICD-10-CM | POA: Diagnosis not present

## 2014-05-02 DIAGNOSIS — Z85828 Personal history of other malignant neoplasm of skin: Secondary | ICD-10-CM | POA: Diagnosis not present

## 2014-05-02 DIAGNOSIS — L821 Other seborrheic keratosis: Secondary | ICD-10-CM | POA: Diagnosis not present

## 2014-05-02 DIAGNOSIS — L570 Actinic keratosis: Secondary | ICD-10-CM | POA: Diagnosis not present

## 2014-05-02 DIAGNOSIS — L814 Other melanin hyperpigmentation: Secondary | ICD-10-CM | POA: Diagnosis not present

## 2014-05-02 DIAGNOSIS — D1801 Hemangioma of skin and subcutaneous tissue: Secondary | ICD-10-CM | POA: Diagnosis not present

## 2014-05-02 DIAGNOSIS — D485 Neoplasm of uncertain behavior of skin: Secondary | ICD-10-CM | POA: Diagnosis not present

## 2014-05-02 DIAGNOSIS — L853 Xerosis cutis: Secondary | ICD-10-CM | POA: Diagnosis not present

## 2014-05-02 DIAGNOSIS — L820 Inflamed seborrheic keratosis: Secondary | ICD-10-CM | POA: Diagnosis not present

## 2014-05-06 DIAGNOSIS — F3181 Bipolar II disorder: Secondary | ICD-10-CM | POA: Diagnosis not present

## 2014-05-10 ENCOUNTER — Ambulatory Visit: Payer: Medicare Other

## 2014-05-29 ENCOUNTER — Ambulatory Visit: Payer: Medicare Other | Attending: Internal Medicine

## 2014-06-03 DIAGNOSIS — F3181 Bipolar II disorder: Secondary | ICD-10-CM | POA: Diagnosis not present

## 2014-06-05 ENCOUNTER — Telehealth: Payer: Self-pay | Admitting: Cardiology

## 2014-06-05 MED ORDER — ROSUVASTATIN CALCIUM 20 MG PO TABS: 20.0000 mg | ORAL_TABLET | Freq: Every day | ORAL | Status: DC

## 2014-06-05 NOTE — Telephone Encounter (Signed)
Pt would like some samples of Crestor please. °

## 2014-06-05 NOTE — Telephone Encounter (Signed)
Returned call to patient office out of crestor samples.Prescription sent to pharmacy.

## 2014-06-10 DIAGNOSIS — F3181 Bipolar II disorder: Secondary | ICD-10-CM | POA: Diagnosis not present

## 2014-06-11 ENCOUNTER — Telehealth: Payer: Self-pay

## 2014-06-11 MED ORDER — ATORVASTATIN CALCIUM 40 MG PO TABS: 40.0000 mg | ORAL_TABLET | Freq: Every day | ORAL | Status: DC

## 2014-06-11 NOTE — Telephone Encounter (Signed)
Returned call to patient she stated she cannot afford crestor.Wanted to know if she can go without for 1 month until becomes generic.Spoke to [place] he advised needs to take a statin.Advised to take lipitor 40 mg daily until crestor becomes generic.

## 2014-06-13 DIAGNOSIS — R351 Nocturia: Secondary | ICD-10-CM | POA: Diagnosis not present

## 2014-06-13 DIAGNOSIS — N3941 Urge incontinence: Secondary | ICD-10-CM | POA: Diagnosis not present

## 2014-06-18 DIAGNOSIS — N3946 Mixed incontinence: Secondary | ICD-10-CM | POA: Diagnosis not present

## 2014-06-18 DIAGNOSIS — N3944 Nocturnal enuresis: Secondary | ICD-10-CM | POA: Diagnosis not present

## 2014-06-24 DIAGNOSIS — F3181 Bipolar II disorder: Secondary | ICD-10-CM | POA: Diagnosis not present

## 2014-07-15 DIAGNOSIS — F3181 Bipolar II disorder: Secondary | ICD-10-CM | POA: Diagnosis not present

## 2014-07-17 DIAGNOSIS — N3946 Mixed incontinence: Secondary | ICD-10-CM | POA: Diagnosis not present

## 2014-07-17 DIAGNOSIS — R351 Nocturia: Secondary | ICD-10-CM | POA: Diagnosis not present

## 2014-07-24 DIAGNOSIS — N3944 Nocturnal enuresis: Secondary | ICD-10-CM | POA: Diagnosis not present

## 2014-07-24 DIAGNOSIS — R351 Nocturia: Secondary | ICD-10-CM | POA: Diagnosis not present

## 2014-07-24 DIAGNOSIS — N3941 Urge incontinence: Secondary | ICD-10-CM | POA: Diagnosis not present

## 2014-07-25 ENCOUNTER — Telehealth: Payer: Self-pay

## 2014-07-25 MED ORDER — ATORVASTATIN CALCIUM 40 MG PO TABS: 40.0000 mg | ORAL_TABLET | Freq: Every day | ORAL | Status: DC

## 2014-07-25 NOTE — Telephone Encounter (Signed)
Received a call from patient she stated generic crestor too expensive.Advised to go back to lipitor 40 mg daily.Prescription sent to pharmacy.

## 2014-08-02 ENCOUNTER — Ambulatory Visit: Payer: Medicare Other | Attending: Internal Medicine | Admitting: Rehabilitative and Restorative Service Providers"

## 2014-08-02 DIAGNOSIS — R296 Repeated falls: Secondary | ICD-10-CM | POA: Diagnosis not present

## 2014-08-02 DIAGNOSIS — R531 Weakness: Secondary | ICD-10-CM

## 2014-08-02 DIAGNOSIS — R293 Abnormal posture: Secondary | ICD-10-CM | POA: Insufficient documentation

## 2014-08-02 DIAGNOSIS — R2689 Other abnormalities of gait and mobility: Secondary | ICD-10-CM | POA: Diagnosis not present

## 2014-08-02 DIAGNOSIS — R269 Unspecified abnormalities of gait and mobility: Secondary | ICD-10-CM | POA: Insufficient documentation

## 2014-08-02 DIAGNOSIS — R29898 Other symptoms and signs involving the musculoskeletal system: Secondary | ICD-10-CM | POA: Diagnosis not present

## 2014-08-02 DIAGNOSIS — M25669 Stiffness of unspecified knee, not elsewhere classified: Secondary | ICD-10-CM

## 2014-08-02 NOTE — Therapy (Signed)
[facility] [address] [place], [place], [address] Phone: [phone]   Fax:  [phone]  Physical Therapy Evaluation  Patient Details  Name: [name] MRN: [id number] Date of Birth: [birth year] Referring Provider:  Shon Baton, MD  Encounter Date: 08/02/2014      PT End of Session - 08/02/14 1507    Visit Number 1   Number of Visits 16   Date for PT Re-Evaluation 10/01/14   PT Start Time 1320   PT Stop Time 1400   PT Time Calculation (min) 40 min   Equipment Utilized During Treatment Gait belt   Activity Tolerance Patient tolerated treatment well   Behavior During Therapy [facility] for tasks assessed/performed      Past Medical History  Diagnosis Date  . Hyperlipidemia   . Hypertension   . Obstructive sleep apnea   . Bifascicular block   . Diabetes mellitus   . Thyroid disease   . Hypothyroidism   . Gastric ulcer   . Squamous cell skin cancer   . Shortness of breath   . Stroke 03/31/2012  . GERD (gastroesophageal reflux disease)   . Kidney stones     HX OF  . Arthritis     Past Surgical History  Procedure Laterality Date  . Total knee arthroplasty      Left knee  . Tonsillectomy    . Appendectomy    . Hernia repair    . Rotator cuff repair      RT SHOULDER  . Wrist fracture surgery    . Tee without cardioversion N/A 09/12/2012    Procedure: TRANSESOPHAGEAL ECHOCARDIOGRAM (TEE);  Surgeon: Lelon Perla, MD;  Location: [facility] ENDOSCOPY;  Service: Cardiovascular;  Laterality: N/A;    There were no vitals filed for this visit.  Visit Diagnosis:  Frequent falls  Generalized weakness  Poor balance  Decreased ROM of lower extremity  Abnormality of gait  Posture abnormality      Subjective Assessment - 08/02/14 1322    Subjective Patient is a 80 y.o. female who presents to PT clinic today for a falls evaluation. Pt reports "I fall all the time, and I have no balance at all." Pt complains  of difficulty with balance on steps and stairs, and diffculty walking.  pt also complains of difficulty completing functional tasks like reach under cabinets for items.   Patient Stated Goals improving balance, safety and confidence with stairs and decreasing falls    Currently in Pain? No/denies            [facility] PT Assessment - 08/02/14 1326    Assessment   Medical Diagnosis frequent falls    Prior Therapy outpatient PT at neuro clinic   Precautions   Precautions Fall   Balance Screen   Has the patient fallen in the past 6 months Yes   How many times? 3   Has the patient had a decrease in activity level because of a fear of falling?  Yes   Is the patient reluctant to leave their home because of a fear of falling?  Yes   [place] [facility];Other (Comment)  hired Architect to enter   Entrance Stairs-Number of Steps [place] One level   [place] - single point;Walker - 2 wheels  rollator    Prior Function   Level of Independence Needs assistance with ADLs;Needs  assistance with homemaking  aide hired one month agao   Cognition   Overall Cognitive Status Within Functional Limits for tasks assessed   Sensation   Light Touch Appears Intact  pt reports decreased sensation    Posture/Postural Control   Posture/Postural Control Postural limitations   Postural Limitations Rounded Shoulders;Forward head;Increased thoracic kyphosis   ROM / Strength   AROM / PROM / Strength Strength   Strength   Overall Strength Deficits   Overall Strength Comments LE strength grossly 4/5    Flexibility   Soft Tissue Assessment /Muscle Length yes   Hamstrings limited   limited heelcord as well bilaterally    Ambulation/Gait   Ambulation/Gait Yes   Ambulation/Gait Assistance 4: Min guard   Ambulation Distance (Feet) 300 Feet   Assistive device Straight cane   Gait Pattern Step-to  pattern;Decreased stride length;Decreased weight shift to right;Wide base of support;Poor foot clearance - left;Poor foot clearance - right   Ambulation Surface Level;Indoor   Gait velocity 1.23 ft/sec    Stairs Yes   Stair Management Technique Two rails;Step to pattern;Sideways  more difficulty going down steps    Number of Stairs 4   Standardized Balance Assessment   Standardized Balance Assessment Berg Balance Test;Timed Up and Go Test   Berg Balance Test   Sit to Stand Able to stand  independently using hands   Standing Unsupported Able to stand 2 minutes with supervision   Sitting with Back Unsupported but Feet Supported on Floor or Stool Able to sit safely and securely 2 minutes   Stand to Sit Uses backs of legs against chair to control descent   Transfers Able to transfer safely, minor use of hands   Standing Unsupported with Eyes Closed Able to stand 10 seconds with supervision   Standing Ubsupported with Feet Together Needs help to attain position but able to stand for 30 seconds with feet together   From Standing, Reach Forward with Outstretched Arm Can reach forward >5 cm safely (2")   From Standing Position, Pick up Object from Floor Able to pick up shoe, needs supervision   From Standing Position, Turn to Look Behind Over each Shoulder Looks behind from both sides and weight shifts well   Turn 360 Degrees Able to turn 360 degrees safely in 4 seconds or less   Standing Unsupported, Alternately Place Feet on Step/Stool Able to complete 4 steps without aid or supervision   Standing Unsupported, One Foot in Front Able to plae foot ahead of the other independently and hold 30 seconds   Standing on One Leg Tries to lift leg/unable to hold 3 seconds but remains standing independently   Total Score 39   Timed Up and Go Test   TUG (seconds) 21.87 with SPC   TUG Comments unsteadiness with turns                PT Education - 08/02/14 1505    Education provided Yes    Education Details education provided to patient regaurding home equipment to prevent falls (grab bars for shower)   Person(s) Educated Patient   Methods Handout;Explanation   Comprehension Verbalized understanding          PT Short Term Goals - 08/02/14 1519    PT SHORT TERM GOAL #1   Title Pt will be independent with HEP to address balance, strength, and gait. Target date July 8th.    Time 4   Period Weeks   Status New   PT SHORT TERM  GOAL #2   Title The patient will increase walking speed from 1.23 ft/sec up to 1.8 ft/sec to demo decreased risk for falls (with least restrictive device). Target date July 8th.   Time 4   Period Weeks   Status New   PT SHORT TERM GOAL #3   Title The patient will improve Berg from 39/56 up to 44/56 to demo decreasing risk for falls. Target date July 8th.    Time 4   Period Weeks   Status New   PT SHORT TERM GOAL #4   Title The patient will decrease TUG score from 21.87 seconds to < or equal to 18 seconds to demo decreasing risk for falls. Target date July 8th.    Time 4   Period Weeks   Status New           PT Long Term Goals - 08/02/14 1523    PT LONG TERM GOAL #1   Title The patient will improve gait speed from 1.23 ft/sec up to 2.62 ft/sec to demo improving mobility and reach "community ambulator" functional level. Target date August 5th.   Time 8   Period Weeks   Status New   PT LONG TERM GOAL #2   Title The patient will improve Berg from 39/56 up to 48/56 to demo decreasing risk for falls. Target date August 5th.   Time 8   Period Weeks   Status New   PT LONG TERM GOAL #3   Title The patient will negotiate 4 steps x 3 reps with one handrail and reciprocal pattern modified indep. Target date August 5th.   Time 8   Period Weeks   Status New   PT LONG TERM GOAL #4   Title The patient will demonstrate safe, correct use of least restrictive assistive device 300 feet with SBA. Target date August 5th.   Time 8   Period Weeks    Status New   PT LONG TERM GOAL #5   Title The patient will decrease TUG score from 21.87 seconds to < or equal to 14 seconds to demo decreasing risk for falls. Target date August 5th.   Time 8   Period Weeks   Status New               Plan - 08/02/14 1508    Clinical Impression Statement Today pt demonstrated deficits with gait, balance, strength, and posture- all of which may be contributing to her frequent falls and feelings of imbalance. Standardized balance assessments (Timed-up and Go, Berg Balance, and gait velocity) conveyed today that pt is at a high falls risk as well. pt would benefit from skilled PT to address current deficits and decrease the occurance of her falls.    Pt will benefit from skilled therapeutic intervention in order to improve on the following deficits Abnormal gait;Decreased mobility;Decreased strength;Postural dysfunction;Impaired flexibility;Decreased knowledge of use of DME;Decreased balance;Decreased coordination;Difficulty walking   Rehab Potential Good   PT Frequency 2x / week   PT Duration 8 weeks   PT Treatment/Interventions ADLs/Self Care Home Management;Functional mobility training;Patient/family education;Passive range of motion;Therapeutic activities;Biofeedback;Therapeutic exercise;Balance training;Neuromuscular re-education;Gait training;Stair training;DME Instruction   PT Next Visit Plan Establish HEP for balance [facility]); LE strengthening; postural exercises    Consulted and Agree with Plan of Care Patient         Problem List Patient Active Problem List   Diagnosis Date Noted  . TIA (transient ischemic attack) 09/08/2012  . CVA (cerebral infarction) 04/04/2012  . Cerebellar stroke,  acute 04/01/2012  . 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 Nausea and vomiting 04/01/2012  . Polymyalgia rheumatica 04/01/2012  . Bifascicular block   . Hypertension   . Hyperlipidemia   . Obstructive sleep apnea   . Diabetes mellitus     Fanny Dance, Student-PT This entire session was performed under direct supervision and direction of a licensed therapist/therapist assistant . I have personally read, edited and approve of the note as written.  WEAVER,CHRISTINA, PT   Fanny Dance 08/02/2014, 3:32 PM  [facility] [address] [place], [place], [address] Phone: [phone]   Fax:  [phone]

## 2014-08-13 DIAGNOSIS — N3941 Urge incontinence: Secondary | ICD-10-CM | POA: Diagnosis not present

## 2014-08-13 DIAGNOSIS — N3944 Nocturnal enuresis: Secondary | ICD-10-CM | POA: Diagnosis not present

## 2014-08-19 ENCOUNTER — Other Ambulatory Visit: Payer: Self-pay

## 2014-08-28 ENCOUNTER — Ambulatory Visit: Payer: Medicare Other | Admitting: Rehabilitative and Restorative Service Providers"

## 2014-08-30 ENCOUNTER — Ambulatory Visit: Payer: Medicare Other | Admitting: Rehabilitative and Restorative Service Providers"

## 2014-09-03 ENCOUNTER — Ambulatory Visit: Payer: Medicare Other | Attending: Internal Medicine | Admitting: Rehabilitative and Restorative Service Providers"

## 2014-09-03 DIAGNOSIS — R296 Repeated falls: Secondary | ICD-10-CM | POA: Diagnosis not present

## 2014-09-03 DIAGNOSIS — R531 Weakness: Secondary | ICD-10-CM | POA: Diagnosis not present

## 2014-09-03 DIAGNOSIS — R2689 Other abnormalities of gait and mobility: Secondary | ICD-10-CM | POA: Insufficient documentation

## 2014-09-03 DIAGNOSIS — R269 Unspecified abnormalities of gait and mobility: Secondary | ICD-10-CM

## 2014-09-03 NOTE — Therapy (Signed)
[facility] [address] [place], [place], [address] Phone: [phone]   Fax:  [phone]  Physical Therapy Treatment  Patient Details  Name: [name] MRN: [id number] Date of Birth: 1935/02/13 Referring Provider:  Shon Baton, MD  Encounter Date: 09/03/2014      PT End of Session - 09/03/14 1243    Visit Number 2   Number of Visits 16   Date for PT Re-Evaluation 10/01/14   PT Start Time 1234   PT Stop Time 1320   PT Time Calculation (min) 46 min   Equipment Utilized During Treatment Gait belt   Activity Tolerance Patient tolerated treatment well   Behavior During Therapy [facility] for tasks assessed/performed      Past Medical History  Diagnosis Date  . Hyperlipidemia   . Hypertension   . Obstructive sleep apnea   . Bifascicular block   . Diabetes mellitus   . Thyroid disease   . Hypothyroidism   . Gastric ulcer   . Squamous cell skin cancer   . Shortness of breath   . Stroke 03/31/2012  . GERD (gastroesophageal reflux disease)   . Kidney stones     HX OF  . Arthritis     Past Surgical History  Procedure Laterality Date  . Total knee arthroplasty      Left knee  . Tonsillectomy    . Appendectomy    . Hernia repair    . Rotator cuff repair      RT SHOULDER  . Wrist fracture surgery    . Tee without cardioversion N/A 09/12/2012    Procedure: TRANSESOPHAGEAL ECHOCARDIOGRAM (TEE);  Surgeon: Lelon Perla, MD;  Location: [facility] ENDOSCOPY;  Service: Cardiovascular;  Laterality: N/A;    There were no vitals filed for this visit.  Visit Diagnosis:  Abnormality of gait      Subjective Assessment - 09/03/14 1239    Subjective The patient returned home from the beach this past Monday.  She hasn't been seen in PT since eval due to travelling.  She arrives to therapy with a note from her son reporting these items "1) Very unsteady on feet, no balance 2) Falling out of bed, can't get up by herself.  3)  No endurance, walks a few feet, out of breath.  4) Having trouble using her hands, can't write, can't do her bra."  Then he writes, "Don't know if they are doing anything for cognition but if they are, let me know."  Patient notes that trying to give directions is more challenging and she feels her memory has changed.    Currently in Pain? No/denies      NEUROMUSCULAR RE-EDUCATION: [place] HEP (see below) for balance Sit<>stand with upright posture cues upon rising       Balance Exercises - 09/03/14 1302    [place] PROGRAM   Head Movements Standing;5 reps   Neck Movements --  needs tactile cues   Trunk Movements Standing;5 reps   Ankle Movements Sitting;10 reps   Knee Extensor 10 reps   Knee Flexor 10 reps   Hip ABductor 10 reps  facing countertop   Ankle Plantorflexors 20 reps, support   Ankle Dorsiflexors 20 reps, support   Knee Bends 10 reps, support   Backwards Walking Support   Sideways Walking Assistive device           PT Education - 09/03/14 1332    Education provided Yes   Education Details HEP/ [place]:  neck rotation,  trunk rotation, ankle PF/DF, knee ext, knee flexion, hip abduction, side stepping, mini squats   Person(s) Educated Patient   Methods Explanation;Demonstration;Handout   Comprehension Returned demonstration;Verbalized understanding          PT Short Term Goals - 09/03/14 1336    PT SHORT TERM GOAL #1   Title Pt will be independent with HEP to address balance, strength, and gait. modified target date to October 01, 2014.   Time 4   Period Weeks   Status On-going   PT SHORT TERM GOAL #2   Title The patient will increase walking speed from 1.23 ft/sec up to 1.8 ft/sec to demo decreased risk for falls (with least restrictive device). modified target date to October 01, 2014.   Time 4   Period Weeks   Status On-going   PT SHORT TERM GOAL #3   Title The patient will improve Berg from 39/56 up to 44/56 to demo decreasing risk for falls. modified target  date to October 01, 2014.   Time 4   Period Weeks   Status On-going   PT SHORT TERM GOAL #4   Title The patient will decrease TUG score from 21.87 seconds to < or equal to 18 seconds to demo decreasing risk for falls. modified target date to October 01, 2014.   Time 4   Period Weeks   Status On-going           PT Long Term Goals - 09/03/14 1335    PT LONG TERM GOAL #1   Title The patient will improve gait speed from 1.23 ft/sec up to 2.62 ft/sec to demo improving mobility and reach "community ambulator" functional level. Target date August 5th.   Time 8   Period Weeks   Status On-going   PT LONG TERM GOAL #2   Title The patient will improve Berg from 39/56 up to 48/56 to demo decreasing risk for falls. Target date August 5th.   Time 8   Period Weeks   Status On-going   PT LONG TERM GOAL #3   Title The patient will negotiate 4 steps x 3 reps with one handrail and reciprocal pattern modified indep. Target date August 5th.   Time 8   Period Weeks   Status On-going   PT LONG TERM GOAL #4   Title The patient will demonstrate safe, correct use of least restrictive assistive device 300 feet with SBA. Target date August 5th.   Time 8   Period Weeks   Status On-going   PT LONG TERM GOAL #5   Title The patient will decrease TUG score from 21.87 seconds to < or equal to 14 seconds to demo decreasing risk for falls. Target date August 5th.   Time 8   Period Weeks   Status On-going          Plan - 09/03/14 1337    Clinical Impression Statement The patient has been out of town and just returning to PT after 1 month since evaluation.  Patient reports continuing decline in mobility with a couple of falls out of the bed and increasing shortness of breath.  At today's session, she reports that the [facility] Fall feels like 8/10 exertion level and required multiple rest breaks.  Her O2% was 89% after standing exercise at countertop and returned to 92% within a minute of resting.  PT to continue to  monitor and recommended patient schedule a f/u visit with cardiiologist as her last appt was 11/2013 and she is usually  seen on an annual basis.  Continue working towards STGs/LTGs.     PT Next Visit Plan MONITOR O2%, Gait training, standing exercises at countertop for balance and strengthening, endurance   Consulted and Agree with Plan of Care Patient        Problem List Patient Active Problem List   Diagnosis Date Noted  . TIA (transient ischemic attack) 09/08/2012  . CVA (cerebral infarction) 04/04/2012  . Cerebellar stroke, acute 04/01/2012  . 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 Nausea and vomiting 04/01/2012  . Polymyalgia rheumatica 04/01/2012  . Bifascicular block   . Hypertension   . Hyperlipidemia   . Obstructive sleep apnea   . Diabetes mellitus     Laith Antonelli, PT 09/03/2014, 1:45 PM  [facility] [address] [place], [place], [address] Phone: [phone]   Fax:  [phone]

## 2014-09-05 ENCOUNTER — Ambulatory Visit: Payer: Medicare Other | Admitting: Physical Therapy

## 2014-09-05 ENCOUNTER — Encounter: Payer: Self-pay | Admitting: Physical Therapy

## 2014-09-05 DIAGNOSIS — R269 Unspecified abnormalities of gait and mobility: Secondary | ICD-10-CM | POA: Diagnosis not present

## 2014-09-05 DIAGNOSIS — R531 Weakness: Secondary | ICD-10-CM | POA: Diagnosis not present

## 2014-09-05 DIAGNOSIS — R296 Repeated falls: Secondary | ICD-10-CM | POA: Diagnosis not present

## 2014-09-05 DIAGNOSIS — R2689 Other abnormalities of gait and mobility: Secondary | ICD-10-CM | POA: Diagnosis not present

## 2014-09-05 NOTE — Therapy (Signed)
[facility] [address] [place], [place], [address] Phone: [phone]   Fax:  [phone]  Physical Therapy Treatment  Patient Details  Name: [name] MRN: [id number] Date of Birth: [birth year] Referring Provider:  Shon Baton, MD  Encounter Date: 09/05/2014      PT End of Session - 09/05/14 1416    Visit Number 3   Number of Visits 16   Date for PT Re-Evaluation 10/01/14   PT Start Time 1409   PT Stop Time 1447   PT Time Calculation (min) 38 min   Equipment Utilized During Treatment Gait belt   Activity Tolerance Patient tolerated treatment well   Behavior During Therapy [facility] for tasks assessed/performed      Past Medical History  Diagnosis Date  . Hyperlipidemia   . Hypertension   . Obstructive sleep apnea   . Bifascicular block   . Diabetes mellitus   . Thyroid disease   . Hypothyroidism   . Gastric ulcer   . Squamous cell skin cancer   . Shortness of breath   . Stroke 03/31/2012  . GERD (gastroesophageal reflux disease)   . Kidney stones     HX OF  . Arthritis     Past Surgical History  Procedure Laterality Date  . Total knee arthroplasty      Left knee  . Tonsillectomy    . Appendectomy    . Hernia repair    . Rotator cuff repair      RT SHOULDER  . Wrist fracture surgery    . Tee without cardioversion N/A 09/12/2012    Procedure: TRANSESOPHAGEAL ECHOCARDIOGRAM (TEE);  Surgeon: Lelon Perla, MD;  Location: [facility] ENDOSCOPY;  Service: Cardiovascular;  Laterality: N/A;    There were no vitals filed for this visit.  Visit Diagnosis:  Abnormality of gait  Frequent falls  Generalized weakness  Poor balance      Subjective Assessment - 09/05/14 1415    Subjective No falls. "Same old knee pain". Pt short of breath on arrival today. SaO2 88% on RA when checked.   Currently in Pain? Yes   Pain Score 5    Pain Location Knee   Pain Orientation Right;Left  right > left   Pain  Descriptors / Indicators Sore;Aching   Pain Type Chronic pain   Pain Onset More than a month ago   Pain Frequency Occasional   Aggravating Factors  walking   Pain Relieving Factors resting, tylenol     Treatment: SaO2 up to 92% after seated rest from walking into gym from lobby prior to exercises. Exercises: OTAGO components performed: cervical rotation left/right x 5 reps each way, trunk rotation left/right x 5 each way, trunk extension x 5 reps, minisquats x 10 reps, and seated ankle pumps x 10 each leg. SaO2 92% after exercises  Other exercises: Standing with chair support: heel/toe raises x 10 reps, alternating hip abduction/extension x 10 reps each bil legs. SaO2 90-91% afterwards.   Gait 115 feet no AD with min guard assist. Cues to decrease shuffling, for heel strike at intial contact, for increased step length bil legs and for upright posture. SaO2 89%. Seated rest x 2-3 minutes with pursed lip breathing increased to 92%, RA. 230 feet with HHA, cues as above. Increased shuffling noted with last 50 feet, despite cues. SaO2 84% afterwards. Seated rest x 5-6 minutes with pursed lip breathing, SaO2 increased to 91-92% RA.         PT Short  Term Goals - 09/03/14 1336    PT SHORT TERM GOAL #1   Title Pt will be independent with HEP to address balance, strength, and gait. modified target date to October 01, 2014.   Time 4   Period Weeks   Status On-going   PT SHORT TERM GOAL #2   Title The patient will increase walking speed from 1.23 ft/sec up to 1.8 ft/sec to demo decreased risk for falls (with least restrictive device). modified target date to October 01, 2014.   Time 4   Period Weeks   Status On-going   PT SHORT TERM GOAL #3   Title The patient will improve Berg from 39/56 up to 44/56 to demo decreasing risk for falls. modified target date to October 01, 2014.   Time 4   Period Weeks   Status On-going   PT SHORT TERM GOAL #4   Title The patient will decrease TUG score from 21.87  seconds to < or equal to 18 seconds to demo decreasing risk for falls. modified target date to October 01, 2014.   Time 4   Period Weeks   Status On-going           PT Long Term Goals - 09/03/14 1335    PT LONG TERM GOAL #1   Title The patient will improve gait speed from 1.23 ft/sec up to 2.62 ft/sec to demo improving mobility and reach "community ambulator" functional level. Target date August 5th.   Time 8   Period Weeks   Status On-going   PT LONG TERM GOAL #2   Title The patient will improve Berg from 39/56 up to 48/56 to demo decreasing risk for falls. Target date August 5th.   Time 8   Period Weeks   Status On-going   PT LONG TERM GOAL #3   Title The patient will negotiate 4 steps x 3 reps with one handrail and reciprocal pattern modified indep. Target date August 5th.   Time 8   Period Weeks   Status On-going   PT LONG TERM GOAL #4   Title The patient will demonstrate safe, correct use of least restrictive assistive device 300 feet with SBA. Target date August 5th.   Time 8   Period Weeks   Status On-going   PT LONG TERM GOAL #5   Title The patient will decrease TUG score from 21.87 seconds to < or equal to 14 seconds to demo decreasing risk for falls. Target date August 5th.   Time 8   Period Weeks   Status On-going            Plan - 09/05/14 1417    Clinical Impression Statement Pt continues to get short of breath with low SaO2 levels during actiivty. Needs several minutes to recover breath and sat's. Reinforced PT recommendation for pt to call her cardiologist after pt made the comment she was waiting to call him after she saw her primary Md in August. Pt edcuated that this could be heart/lung related and she should not wait. Pt verbalized understanding and agreeded to call cardiologist. Pt making steady progress. limited by breathing issues.                             Pt will benefit from skilled therapeutic intervention in order to improve on the following  deficits Abnormal gait;Decreased mobility;Decreased strength;Postural dysfunction;Impaired flexibility;Decreased knowledge of use of DME;Decreased balance;Decreased coordination;Difficulty walking   Rehab Potential Good  PT Frequency 2x / week   PT Duration 8 weeks   PT Treatment/Interventions ADLs/Self Care Home Management;Functional mobility training;Patient/family education;Passive range of motion;Therapeutic activities;Biofeedback;Therapeutic exercise;Balance training;Neuromuscular re-education;Gait training;Stair training;DME Instruction   PT Next Visit Plan MONITOR O2%, Gait training, standing exercises at countertop for balance and strengthening, endurance   Consulted and Agree with Plan of Care Patient        Problem List Patient Active Problem List   Diagnosis Date Noted  . TIA (transient ischemic attack) 09/08/2012  . CVA (cerebral infarction) 04/04/2012  . Cerebellar stroke, acute 04/01/2012  . 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 Nausea and vomiting 04/01/2012  . Polymyalgia rheumatica 04/01/2012  . Bifascicular block   . Hypertension   . Hyperlipidemia   . Obstructive sleep apnea   . Diabetes mellitus     Willow Ora 09/05/2014, 3:33 PM  Willow Ora, PTA, [facility] [address], [place], [place] [address] [phone] 09/05/2014, 3:33 PM

## 2014-09-06 DIAGNOSIS — E538 Deficiency of other specified B group vitamins: Secondary | ICD-10-CM | POA: Diagnosis not present

## 2014-09-06 DIAGNOSIS — Z6836 Body mass index (BMI) 36.0-36.9, adult: Secondary | ICD-10-CM | POA: Diagnosis not present

## 2014-09-06 DIAGNOSIS — E559 Vitamin D deficiency, unspecified: Secondary | ICD-10-CM | POA: Diagnosis not present

## 2014-09-06 DIAGNOSIS — E1140 Type 2 diabetes mellitus with diabetic neuropathy, unspecified: Secondary | ICD-10-CM | POA: Diagnosis not present

## 2014-09-06 DIAGNOSIS — R32 Unspecified urinary incontinence: Secondary | ICD-10-CM | POA: Diagnosis not present

## 2014-09-06 DIAGNOSIS — G4733 Obstructive sleep apnea (adult) (pediatric): Secondary | ICD-10-CM | POA: Diagnosis not present

## 2014-09-06 DIAGNOSIS — R0600 Dyspnea, unspecified: Secondary | ICD-10-CM | POA: Diagnosis not present

## 2014-09-06 DIAGNOSIS — J45909 Unspecified asthma, uncomplicated: Secondary | ICD-10-CM | POA: Diagnosis not present

## 2014-09-06 DIAGNOSIS — R5383 Other fatigue: Secondary | ICD-10-CM | POA: Diagnosis not present

## 2014-09-06 DIAGNOSIS — E039 Hypothyroidism, unspecified: Secondary | ICD-10-CM | POA: Diagnosis not present

## 2014-09-09 DIAGNOSIS — E538 Deficiency of other specified B group vitamins: Secondary | ICD-10-CM | POA: Diagnosis not present

## 2014-09-10 ENCOUNTER — Ambulatory Visit: Payer: Medicare Other | Admitting: Rehabilitative and Restorative Service Providers"

## 2014-09-10 VITALS — HR 79

## 2014-09-10 DIAGNOSIS — R269 Unspecified abnormalities of gait and mobility: Secondary | ICD-10-CM | POA: Diagnosis not present

## 2014-09-10 DIAGNOSIS — R2689 Other abnormalities of gait and mobility: Secondary | ICD-10-CM | POA: Diagnosis not present

## 2014-09-10 DIAGNOSIS — R296 Repeated falls: Secondary | ICD-10-CM | POA: Diagnosis not present

## 2014-09-10 DIAGNOSIS — R531 Weakness: Secondary | ICD-10-CM | POA: Diagnosis not present

## 2014-09-11 NOTE — Therapy (Signed)
[facility] [address] [place], [place], [address] Phone: [phone]   Fax:  [phone]  Physical Therapy Treatment  Patient Details  Name: [name] MRN: [id number] Date of Birth: January 11, 1935 Referring Provider:  Shon Baton, MD  Encounter Date: 09/10/2014      PT End of Session - 09/10/14 1332    Visit Number 4   Number of Visits 16   Date for PT Re-Evaluation 10/01/14   PT Start Time 1250   PT Stop Time 1325   PT Time Calculation (min) 35 min   Equipment Utilized During Treatment Gait belt   Activity Tolerance Patient limited by fatigue;Treatment limited secondary to medical complications (Comment)  limited due to Oxygen decrease with activity   Behavior During Therapy [facility] for tasks assessed/performed      Past Medical History  Diagnosis Date  . Hyperlipidemia   . Hypertension   . Obstructive sleep apnea   . Bifascicular block   . Diabetes mellitus   . Thyroid disease   . Hypothyroidism   . Gastric ulcer   . Squamous cell skin cancer   . Shortness of breath   . Stroke 03/31/2012  . GERD (gastroesophageal reflux disease)   . Kidney stones     HX OF  . Arthritis     Past Surgical History  Procedure Laterality Date  . Total knee arthroplasty      Left knee  . Tonsillectomy    . Appendectomy    . Hernia repair    . Rotator cuff repair      RT SHOULDER  . Wrist fracture surgery    . Tee without cardioversion N/A 09/12/2012    Procedure: TRANSESOPHAGEAL ECHOCARDIOGRAM (TEE);  Surgeon: Lelon Perla, MD;  Location: [facility] ENDOSCOPY;  Service: Cardiovascular;  Laterality: N/A;    Filed Vitals:   09/10/14 1254  Pulse: 79  SpO2: 89%    Visit Diagnosis:  Abnormality of gait  Generalized weakness      Subjective Assessment - 09/10/14 1253    Subjective The patient reports that she called cardiologist and does not yet have an appointment to be seen re: shortness of breath.   She also  called primary care doctor and they called in new medication for breathing.   Currently in Pain? No/denies     Patient arrived late for today's session.  After walking 50 ft from lobby into clinic, her spO2%=89%, HR=79 Patient reported that she saw primary MD office (PA or NP visit) with prescription of new medication that improves her breathing per report.  Gait: Ambulation x 170 feet nonstop with SPC, with O2 sat decreasing to 87% after 150 feet.  Rest.  Ambulation x 120 ft x 2 reps with rest in between with O2 sat remaining 91%.  THERAPEUTIC EXERCISE: Standing exercise at countertop including marches x 10 reps with UE support Sidestepping with UE support Heel raises with UE support Rest with O2 sat 88% Continued and performed toe raises with UE support Mini squats x 5 reps Sit<>stand x 3 reps Rest due to O2 sat 88%  PT educated patient at this time at home she should sit and rest when she notes any shortness of breath b/c in clinic mild shortness of breath and O2 sat decreasing <90% correlate.  Patient thinks she will hear from pulmonary doctor in next couple of days. Patient and PT discussed holding PT at this time due to decreased tolerance to activity and need for further medical  assessment.  Patient agrees with plan.       PT Education - 09/10/14 1331    Education provided Yes   Education Details Recommended PT hold until further assessment for pulmonary MD due to continued decrease in O2 saturation.   Person(s) Educated Patient   Methods Explanation   Comprehension Verbalized understanding          PT Short Term Goals - 09/03/14 1336    PT SHORT TERM GOAL #1   Title Pt will be independent with HEP to address balance, strength, and gait. modified target date to October 01, 2014.   Time 4   Period Weeks   Status On-going   PT SHORT TERM GOAL #2   Title The patient will increase walking speed from 1.23 ft/sec up to 1.8 ft/sec to demo decreased risk for falls (with  least restrictive device). modified target date to October 01, 2014.   Time 4   Period Weeks   Status On-going   PT SHORT TERM GOAL #3   Title The patient will improve Berg from 39/56 up to 44/56 to demo decreasing risk for falls. modified target date to October 01, 2014.   Time 4   Period Weeks   Status On-going   PT SHORT TERM GOAL #4   Title The patient will decrease TUG score from 21.87 seconds to < or equal to 18 seconds to demo decreasing risk for falls. modified target date to October 01, 2014.   Time 4   Period Weeks   Status On-going           PT Long Term Goals - 09/03/14 1335    PT LONG TERM GOAL #1   Title The patient will improve gait speed from 1.23 ft/sec up to 2.62 ft/sec to demo improving mobility and reach "community ambulator" functional level. Target date August 5th.   Time 8   Period Weeks   Status On-going   PT LONG TERM GOAL #2   Title The patient will improve Berg from 39/56 up to 48/56 to demo decreasing risk for falls. Target date August 5th.   Time 8   Period Weeks   Status On-going   PT LONG TERM GOAL #3   Title The patient will negotiate 4 steps x 3 reps with one handrail and reciprocal pattern modified indep. Target date August 5th.   Time 8   Period Weeks   Status On-going   PT LONG TERM GOAL #4   Title The patient will demonstrate safe, correct use of least restrictive assistive device 300 feet with SBA. Target date August 5th.   Time 8   Period Weeks   Status On-going   PT LONG TERM GOAL #5   Title The patient will decrease TUG score from 21.87 seconds to < or equal to 14 seconds to demo decreasing risk for falls. Target date August 5th.   Time 8   Period Weeks   Status On-going               Plan - 09/10/14 1334    Clinical Impression Statement The patient continues with shortness of breath and feels limited in therapy by breathing.  PT and patient discussed holding until further medical evaluation.   PT Next Visit Plan MONITOR  O2%, Gait training, standing exercises at countertop for balance and strengthening, endurance   Consulted and Agree with Plan of Care Patient        Problem List Patient Active Problem List   Diagnosis  Date Noted  . TIA (transient ischemic attack) 09/08/2012  . CVA (cerebral infarction) 04/04/2012  . Cerebellar stroke, acute 04/01/2012  . 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 Nausea and vomiting 04/01/2012  . Polymyalgia rheumatica 04/01/2012  . Bifascicular block   . Hypertension   . Hyperlipidemia   . Obstructive sleep apnea   . Diabetes mellitus      Yidel Teuscher, PT 09/11/2014, 8:23 AM  [facility] [address] [place], [place], [address] Phone: [phone]   Fax:  [phone]

## 2014-09-12 ENCOUNTER — Ambulatory Visit: Payer: Medicare Other | Admitting: Physical Therapy

## 2014-09-13 ENCOUNTER — Other Ambulatory Visit (HOSPITAL_COMMUNITY): Payer: Self-pay | Admitting: Respiratory Therapy

## 2014-09-13 DIAGNOSIS — G4733 Obstructive sleep apnea (adult) (pediatric): Secondary | ICD-10-CM

## 2014-09-13 DIAGNOSIS — R0600 Dyspnea, unspecified: Secondary | ICD-10-CM

## 2014-09-13 DIAGNOSIS — J45901 Unspecified asthma with (acute) exacerbation: Secondary | ICD-10-CM

## 2014-09-16 DIAGNOSIS — F3181 Bipolar II disorder: Secondary | ICD-10-CM | POA: Diagnosis not present

## 2014-09-17 ENCOUNTER — Ambulatory Visit: Payer: Medicare Other | Admitting: Physical Therapy

## 2014-09-18 ENCOUNTER — Other Ambulatory Visit (HOSPITAL_COMMUNITY): Payer: Self-pay | Admitting: Respiratory Therapy

## 2014-09-18 DIAGNOSIS — N3941 Urge incontinence: Secondary | ICD-10-CM | POA: Diagnosis not present

## 2014-09-18 DIAGNOSIS — N390 Urinary tract infection, site not specified: Secondary | ICD-10-CM | POA: Diagnosis not present

## 2014-09-18 DIAGNOSIS — N3944 Nocturnal enuresis: Secondary | ICD-10-CM | POA: Diagnosis not present

## 2014-09-19 ENCOUNTER — Ambulatory Visit: Payer: Medicare Other | Admitting: Rehabilitative and Restorative Service Providers"

## 2014-09-20 ENCOUNTER — Ambulatory Visit (HOSPITAL_COMMUNITY)
Admission: RE | Admit: 2014-09-20 | Discharge: 2014-09-20 | Disposition: A | Discharge status: Home / Self Care | Payer: Medicare Other | Source: Ambulatory Visit | Attending: Internal Medicine | Admitting: Internal Medicine

## 2014-09-20 ENCOUNTER — Other Ambulatory Visit (HOSPITAL_COMMUNITY): Payer: Self-pay | Admitting: Respiratory Therapy

## 2014-09-20 ENCOUNTER — Ambulatory Visit (HOSPITAL_COMMUNITY): Admission: RE | Admit: 2014-09-20 | Payer: Medicare Other | Source: Ambulatory Visit

## 2014-09-20 DIAGNOSIS — J45909 Unspecified asthma, uncomplicated: Secondary | ICD-10-CM | POA: Diagnosis not present

## 2014-09-20 DIAGNOSIS — R0600 Dyspnea, unspecified: Secondary | ICD-10-CM | POA: Diagnosis not present

## 2014-09-20 DIAGNOSIS — J45901 Unspecified asthma with (acute) exacerbation: Secondary | ICD-10-CM

## 2014-09-20 MED ORDER — ALBUTEROL SULFATE (2.5 MG/3ML) 0.083% IN NEBU
2.5000 mg | INHALATION_SOLUTION | Freq: Once | RESPIRATORY_TRACT | Status: AC
  Administered 2014-09-20: 2.5 mg via RESPIRATORY_TRACT

## 2014-09-22 LAB — PULMONARY FUNCTION TEST
DL/VA % pred: 55 %
DL/VA: 2.65 ml/min/mmHg/L
DLCO UNC % PRED: 40 %
DLCO UNC: 9.74 ml/min/mmHg
FEF 25-75 POST: 1.13 L/s
FEF 25-75 PRE: 0.67 L/s
FEF2575-%Change-Post: 68 %
FEF2575-%PRED-PRE: 44 %
FEF2575-%Pred-Post: 74 %
FEV1-%CHANGE-POST: 18 %
FEV1-%Pred-Post: 70 %
FEV1-%Pred-Pre: 59 %
FEV1-Post: 1.41 L
FEV1-Pre: 1.2 L
FEV1FVC-%Change-Post: -4 %
FEV1FVC-%PRED-PRE: 80 %
FEV6-%CHANGE-POST: 20 %
FEV6-%PRED-POST: 93 %
FEV6-%Pred-Pre: 77 %
FEV6-POST: 2.38 L
FEV6-Pre: 1.99 L
FEV6FVC-%Change-Post: -2 %
FEV6FVC-%PRED-PRE: 104 %
FEV6FVC-%Pred-Post: 101 %
FVC-%Change-Post: 23 %
FVC-%PRED-POST: 91 %
FVC-%PRED-PRE: 74 %
FVC-POST: 2.47 L
FVC-Pre: 2.01 L
POST FEV1/FVC RATIO: 57 %
POST FEV6/FVC RATIO: 96 %
Pre FEV1/FVC ratio: 60 %
Pre FEV6/FVC Ratio: 99 %
RV % pred: 138 %
RV: 3.25 L
TLC % PRED: 105 %
TLC: 5.34 L

## 2014-09-23 ENCOUNTER — Other Ambulatory Visit (HOSPITAL_COMMUNITY): Payer: Self-pay | Admitting: Respiratory Therapy

## 2014-09-24 ENCOUNTER — Other Ambulatory Visit (HOSPITAL_COMMUNITY): Payer: Self-pay | Admitting: Respiratory Therapy

## 2014-09-25 ENCOUNTER — Other Ambulatory Visit (HOSPITAL_COMMUNITY): Payer: Self-pay | Admitting: Respiratory Therapy

## 2014-09-26 ENCOUNTER — Other Ambulatory Visit (HOSPITAL_COMMUNITY): Payer: Self-pay | Admitting: Respiratory Therapy

## 2014-09-27 ENCOUNTER — Other Ambulatory Visit (HOSPITAL_COMMUNITY): Payer: Self-pay | Admitting: Respiratory Therapy

## 2014-09-30 ENCOUNTER — Other Ambulatory Visit (HOSPITAL_COMMUNITY): Payer: Self-pay | Admitting: Respiratory Therapy

## 2014-09-30 ENCOUNTER — Ambulatory Visit (HOSPITAL_COMMUNITY): Admission: RE | Admit: 2014-09-30 | Payer: Medicare Other | Source: Ambulatory Visit

## 2014-09-30 ENCOUNTER — Other Ambulatory Visit (HOSPITAL_COMMUNITY): Payer: Self-pay | Admitting: Internal Medicine

## 2014-09-30 DIAGNOSIS — R269 Unspecified abnormalities of gait and mobility: Secondary | ICD-10-CM | POA: Diagnosis not present

## 2014-09-30 DIAGNOSIS — R32 Unspecified urinary incontinence: Secondary | ICD-10-CM | POA: Diagnosis not present

## 2014-09-30 DIAGNOSIS — R454 Irritability and anger: Secondary | ICD-10-CM | POA: Diagnosis not present

## 2014-09-30 DIAGNOSIS — J449 Chronic obstructive pulmonary disease, unspecified: Secondary | ICD-10-CM | POA: Diagnosis not present

## 2014-09-30 DIAGNOSIS — E1140 Type 2 diabetes mellitus with diabetic neuropathy, unspecified: Secondary | ICD-10-CM | POA: Diagnosis not present

## 2014-09-30 DIAGNOSIS — M353 Polymyalgia rheumatica: Secondary | ICD-10-CM | POA: Diagnosis not present

## 2014-09-30 DIAGNOSIS — E538 Deficiency of other specified B group vitamins: Secondary | ICD-10-CM | POA: Diagnosis not present

## 2014-09-30 DIAGNOSIS — I10 Essential (primary) hypertension: Secondary | ICD-10-CM | POA: Diagnosis not present

## 2014-09-30 DIAGNOSIS — F329 Major depressive disorder, single episode, unspecified: Secondary | ICD-10-CM | POA: Diagnosis not present

## 2014-09-30 DIAGNOSIS — Z6836 Body mass index (BMI) 36.0-36.9, adult: Secondary | ICD-10-CM | POA: Diagnosis not present

## 2014-09-30 DIAGNOSIS — N183 Chronic kidney disease, stage 3 (moderate): Secondary | ICD-10-CM | POA: Diagnosis not present

## 2014-10-01 ENCOUNTER — Other Ambulatory Visit (HOSPITAL_COMMUNITY): Payer: Self-pay | Admitting: Respiratory Therapy

## 2014-10-02 ENCOUNTER — Other Ambulatory Visit (HOSPITAL_COMMUNITY): Payer: Self-pay | Admitting: Respiratory Therapy

## 2014-10-03 ENCOUNTER — Other Ambulatory Visit (HOSPITAL_COMMUNITY): Payer: Self-pay | Admitting: Respiratory Therapy

## 2014-10-07 ENCOUNTER — Other Ambulatory Visit (HOSPITAL_COMMUNITY): Payer: Self-pay | Admitting: Respiratory Therapy

## 2014-10-07 DIAGNOSIS — F3181 Bipolar II disorder: Secondary | ICD-10-CM | POA: Diagnosis not present

## 2014-10-08 ENCOUNTER — Other Ambulatory Visit (HOSPITAL_COMMUNITY): Payer: Self-pay | Admitting: Respiratory Therapy

## 2014-10-10 ENCOUNTER — Other Ambulatory Visit (HOSPITAL_COMMUNITY): Payer: Self-pay | Admitting: Respiratory Therapy

## 2014-10-14 DIAGNOSIS — N3941 Urge incontinence: Secondary | ICD-10-CM | POA: Diagnosis not present

## 2014-10-23 DIAGNOSIS — N3946 Mixed incontinence: Secondary | ICD-10-CM | POA: Diagnosis not present

## 2014-10-24 ENCOUNTER — Other Ambulatory Visit: Payer: Self-pay | Admitting: Urology

## 2014-10-30 ENCOUNTER — Institutional Professional Consult (permissible substitution): Payer: Self-pay | Admitting: Neurology

## 2014-11-01 ENCOUNTER — Encounter: Payer: Self-pay | Admitting: Rehabilitative and Restorative Service Providers"

## 2014-11-01 NOTE — Therapy (Signed)
[facility] [address] [place], [place], [address] Phone: [phone]   Fax:  [phone]  Patient Details  Name: [name] MRN: [id number] Date of Birth: [birth year] Referring Provider:  No ref. provider found  Encounter Date: last encounter 09/10/2014  PHYSICAL THERAPY DISCHARGE SUMMARY  Visits from Start of Care: 4  Current functional level related to goals / functional outcomes:     PT Short Term Goals - 09/03/14 1336    PT SHORT TERM GOAL #1   Title Pt will be independent with HEP to address balance, strength, and gait. modified target date to October 01, 2014.   Time 4   Period Weeks   Status On-going   PT SHORT TERM GOAL #2   Title The patient will increase walking speed from 1.23 ft/sec up to 1.8 ft/sec to demo decreased risk for falls (with least restrictive device). modified target date to October 01, 2014.   Time 4   Period Weeks   Status On-going   PT SHORT TERM GOAL #3   Title The patient will improve Berg from 39/56 up to 44/56 to demo decreasing risk for falls. modified target date to October 01, 2014.   Time 4   Period Weeks   Status On-going   PT SHORT TERM GOAL #4   Title The patient will decrease TUG score from 21.87 seconds to < or equal to 18 seconds to demo decreasing risk for falls. modified target date to October 01, 2014.   Time 4   Period Weeks   Status On-going         PT Long Term Goals - 09/03/14 1335    PT LONG TERM GOAL #1   Title The patient will improve gait speed from 1.23 ft/sec up to 2.62 ft/sec to demo improving mobility and reach "community ambulator" functional level. Target date August 5th.   Time 8   Period Weeks   Status On-going   PT LONG TERM GOAL #2   Title The patient will improve Berg from 39/56 up to 48/56 to demo decreasing risk for falls. Target date August 5th.   Time 8   Period Weeks   Status On-going   PT LONG TERM GOAL #3   Title The patient will  negotiate 4 steps x 3 reps with one handrail and reciprocal pattern modified indep. Target date August 5th.   Time 8   Period Weeks   Status On-going   PT LONG TERM GOAL #4   Title The patient will demonstrate safe, correct use of least restrictive assistive device 300 feet with SBA. Target date August 5th.   Time 8   Period Weeks   Status On-going   PT LONG TERM GOAL #5   Title The patient will decrease TUG score from 21.87 seconds to < or equal to 14 seconds to demo decreasing risk for falls. Target date August 5th.   Time 8   Period Weeks   Status On-going        Remaining deficits: *Goals not reassessed as patient sought further medical evaluation due to shortness of breath and declining function.  See evaluation for patient status.   Education / Equipment: HEP, home safety.  Plan: Patient agrees to discharge.  Patient goals were not met. Patient is being discharged due to a change in medical status.  ?????        Thank you for the referral of this patient. Rudell Cobb, MPT  Gustavus Haskin 11/01/2014, 2:28 PM  [facility] [address] [place], [place], [address] Phone: [phone]   Fax:  [phone]

## 2014-11-12 ENCOUNTER — Encounter (HOSPITAL_BASED_OUTPATIENT_CLINIC_OR_DEPARTMENT_OTHER): Payer: Self-pay | Admitting: *Deleted

## 2014-11-12 NOTE — Progress Notes (Signed)
SPOKE W/ PT AND SON.  PT HAS MEMORY ISSUES DUE TO EARLY DEMENTIA , WILL NEED SON IN PREOP.  ARRIVE AT 0900.  NEEDS ISTAT. CURRENT EKG IN CHART AND EPIC. WILL TAKE CYMBALTA, SYNTHROID, AND LAMICTAL AM DOS W/ SIPS OF WATER.

## 2014-11-18 ENCOUNTER — Institutional Professional Consult (permissible substitution): Payer: Self-pay | Admitting: Neurology

## 2014-11-18 NOTE — H&P (Signed)
History of Present Illness   Tamara [name] had a PNE a week ago. [name] had noted that she thought that Botox probably had helped some. With careful history from her son and the patient, I do not think Botox helped her bedwetting. She says she is not leaking while she is sleeping now and she thinks the new setting from yesterday on the right side is better than the initial left side. Her son and I agree to keep it in over the weekend but I am not here Monday and Tuesday so I will remove it on Wednesday. Rare infection issues discussed. Migration issues discussed. I am hopeful this will be helpful.   Past Medical History Problems  1. History of Arthritis 2. History of depression (Z86.59) 3. History of diabetes mellitus (Z86.39) 4. History of esophageal reflux (Z87.19) 5. History of hypercholesterolemia (Z86.39) 6. History of hypertension (Z86.79) 7. History of hypothyroidism (Z86.39) 8. History of sleep apnea (Z87.09)  Surgical History Problems  1. History of Hernia Repair 2. History of Knee Replacement 3. History of Shoulder Arthroscopy With Rotator Cuff Repair 4. History of Wrist Surgery  Current Meds 1. Crestor 10 MG Oral Tablet;  Therapy: (Recorded:06Mar2012) to Recorded 2. Donepezil HCl - 5 MG Oral Tablet;  Therapy: 27Aug2013 to Recorded 3. Folic Acid CAPS;  Therapy: (Recorded:06Mar2012) to Recorded 4. Glimepiride 2 MG Oral Tablet;  Therapy: 11Apr2014 to Recorded 5. Losartan Potassium 50 MG Oral Tablet;  Therapy: [id number] to Recorded 6. Sertraline HCl - 100 MG Oral Tablet;  Therapy: (Recorded:06Mar2012) to Recorded 7. Synthroid 100 MCG Oral Tablet;  Therapy: (Recorded:06Mar2012) to Recorded 8. Vitamin B-12 TABS;  Therapy: (Recorded:06Mar2012) to Recorded 9. Vitamin D 50000 UNIT CAPS;  Therapy: (Recorded:12Aug2015) to Recorded 10. Vitamin D-3 5000 UNIT TABS;   Therapy: (Recorded:06Mar2012) to Recorded  Allergies Medication  1. No Known Drug Allergies Non-Medication   2. Adhesive Tape  Family History Problems  1. Family history of Alzheimer's Disease : Mother 2. Family history of Congestive Heart Failure : Father 3. Family history of Congestive Heart Failure : Mother 4. Denied: Family history of Kidney Cancer  Social History Problems  1. Alcohol Use   maybe one serving a month 2. Former smoker [phone]) 3. Marital History - Widowed  Results/Data  Urine [Data Includes: Last 1 Day]   31Aug2016  COLOR YELLOW   APPEARANCE CLEAR   SPECIFIC GRAVITY 1.025   pH 6.0   GLUCOSE NEGATIVE   BILIRUBIN NEGATIVE   KETONE NEGATIVE   BLOOD NEGATIVE   PROTEIN NEGATIVE   NITRITE NEGATIVE   LEUKOCYTE ESTERASE NEGATIVE    Plan Urge incontinence of urine  1. Follow-up Schedule Surgery Office  Follow-up  Status: Complete  Done: 31Aug2016  Discussion/Summary   [name] reports no bedwetting with the device on the right side. She is very pleased. She may have leaked a few times a small amount. Her daughter was here and they would like to proceed with stage 2. I removed the device easily. The skin looks fine. I did tell [name] and her daughter that I cannot 100% promise the same result but hopes she will get the same result with the device.   After a thorough review of the management options for the patient's condition the patient  elected to proceed with surgical therapy as noted above. We have discussed the potential benefits and risks of the procedure, side effects of the proposed treatment, the likelihood of the patient achieving the goals of the procedure, and any potential problems  that might occur during the procedure or recuperation. Informed consent has been obtained.

## 2014-11-19 ENCOUNTER — Encounter (HOSPITAL_BASED_OUTPATIENT_CLINIC_OR_DEPARTMENT_OTHER): Payer: Self-pay | Admitting: *Deleted

## 2014-11-19 ENCOUNTER — Ambulatory Visit (HOSPITAL_BASED_OUTPATIENT_CLINIC_OR_DEPARTMENT_OTHER): Payer: Medicare Other | Admitting: Certified Registered"

## 2014-11-19 ENCOUNTER — Ambulatory Visit (HOSPITAL_BASED_OUTPATIENT_CLINIC_OR_DEPARTMENT_OTHER)
Admission: RE | Admit: 2014-11-19 | Discharge: 2014-11-19 | Disposition: A | Discharge status: Home / Self Care | Payer: Medicare Other | Source: Ambulatory Visit | Attending: Urology | Admitting: Urology

## 2014-11-19 ENCOUNTER — Ambulatory Visit (HOSPITAL_COMMUNITY): Payer: Medicare Other

## 2014-11-19 ENCOUNTER — Encounter (HOSPITAL_BASED_OUTPATIENT_CLINIC_OR_DEPARTMENT_OTHER)
Admission: RE | Disposition: A | Discharge status: Home / Self Care | Payer: Self-pay | Source: Ambulatory Visit | Attending: Urology

## 2014-11-19 DIAGNOSIS — Z6837 Body mass index (BMI) 37.0-37.9, adult: Secondary | ICD-10-CM | POA: Diagnosis not present

## 2014-11-19 DIAGNOSIS — Z96659 Presence of unspecified artificial knee joint: Secondary | ICD-10-CM | POA: Diagnosis not present

## 2014-11-19 DIAGNOSIS — N3941 Urge incontinence: Secondary | ICD-10-CM | POA: Insufficient documentation

## 2014-11-19 DIAGNOSIS — E119 Type 2 diabetes mellitus without complications: Secondary | ICD-10-CM | POA: Insufficient documentation

## 2014-11-19 DIAGNOSIS — E039 Hypothyroidism, unspecified: Secondary | ICD-10-CM | POA: Diagnosis not present

## 2014-11-19 DIAGNOSIS — Z87891 Personal history of nicotine dependence: Secondary | ICD-10-CM | POA: Diagnosis not present

## 2014-11-19 DIAGNOSIS — F329 Major depressive disorder, single episode, unspecified: Secondary | ICD-10-CM | POA: Diagnosis not present

## 2014-11-19 DIAGNOSIS — I10 Essential (primary) hypertension: Secondary | ICD-10-CM | POA: Insufficient documentation

## 2014-11-19 DIAGNOSIS — R350 Frequency of micturition: Secondary | ICD-10-CM | POA: Diagnosis not present

## 2014-11-19 DIAGNOSIS — K219 Gastro-esophageal reflux disease without esophagitis: Secondary | ICD-10-CM | POA: Diagnosis not present

## 2014-11-19 DIAGNOSIS — E669 Obesity, unspecified: Secondary | ICD-10-CM | POA: Insufficient documentation

## 2014-11-19 DIAGNOSIS — G4730 Sleep apnea, unspecified: Secondary | ICD-10-CM | POA: Diagnosis not present

## 2014-11-19 DIAGNOSIS — M1990 Unspecified osteoarthritis, unspecified site: Secondary | ICD-10-CM | POA: Insufficient documentation

## 2014-11-19 DIAGNOSIS — Z79899 Other long term (current) drug therapy: Secondary | ICD-10-CM | POA: Diagnosis not present

## 2014-11-19 DIAGNOSIS — E780 Pure hypercholesterolemia: Secondary | ICD-10-CM | POA: Insufficient documentation

## 2014-11-19 HISTORY — DX: Bifascicular block: I45.2

## 2014-11-19 HISTORY — DX: Other specified postprocedural states: Z85.9

## 2014-11-19 HISTORY — DX: Personal history of urinary calculi: Z87.442

## 2014-11-19 HISTORY — DX: Personal history of malignant neoplasm, unspecified: Z98.890

## 2014-11-19 HISTORY — DX: Dyspnea, unspecified: R06.00

## 2014-11-19 HISTORY — DX: Other forms of dyspnea: R06.09

## 2014-11-19 HISTORY — DX: Spondylosis without myelopathy or radiculopathy, site unspecified: M47.819

## 2014-11-19 HISTORY — DX: Unspecified dementia, unspecified severity, without behavioral disturbance, psychotic disturbance, mood disturbance, and anxiety: F03.90

## 2014-11-19 HISTORY — DX: Type 2 diabetes mellitus without complications: E11.9

## 2014-11-19 HISTORY — DX: Personal history of transient ischemic attack (TIA), and cerebral infarction without residual deficits: Z86.73

## 2014-11-19 HISTORY — DX: Personal history of peptic ulcer disease: Z87.11

## 2014-11-19 HISTORY — PX: INTERSTIM IMPLANT PLACEMENT: SHX5130

## 2014-11-19 HISTORY — DX: Urge incontinence: N39.41

## 2014-11-19 HISTORY — DX: Personal history of other diseases of the digestive system: Z87.19

## 2014-11-19 HISTORY — DX: Unspecified abnormalities of gait and mobility: R26.9

## 2014-11-19 HISTORY — DX: Polymyalgia rheumatica: M35.3

## 2014-11-19 LAB — POCT I-STAT 4, (NA,K, GLUC, HGB,HCT)
GLUCOSE: 150 mg/dL — AB (ref 65–99)
HEMATOCRIT: 46 % (ref 36.0–46.0)
HEMOGLOBIN: 15.6 g/dL — AB (ref 12.0–15.0)
POTASSIUM: 3.7 mmol/L (ref 3.5–5.1)
SODIUM: 139 mmol/L (ref 135–145)

## 2014-11-19 LAB — GLUCOSE, CAPILLARY: GLUCOSE-CAPILLARY: 137 mg/dL — AB (ref 65–99)

## 2014-11-19 SURGERY — INSERTION, SACRAL NERVE STIMULATOR, INTERSTIM, STAGE 1
Anesthesia: Monitor Anesthesia Care

## 2014-11-19 MED ORDER — SODIUM CHLORIDE 0.9 % IV SOLN: 250.0000 mg | INTRAVENOUS | Status: DC | PRN

## 2014-11-19 MED ORDER — FENTANYL CITRATE (PF) 100 MCG/2ML IJ SOLN
25.0000 ug | INTRAMUSCULAR | Status: DC | PRN
  Filled 2014-11-19: qty 1

## 2014-11-19 MED ORDER — CEFAZOLIN SODIUM 1-5 GM-% IV SOLN
1.0000 g | INTRAVENOUS | Status: DC
  Filled 2014-11-19: qty 50

## 2014-11-19 MED ORDER — CEFAZOLIN SODIUM-DEXTROSE 2-3 GM-% IV SOLR
INTRAVENOUS | Status: AC
  Filled 2014-11-19: qty 50

## 2014-11-19 MED ORDER — BUPIVACAINE-EPINEPHRINE 0.5% -1:200000 IJ SOLN
INTRAMUSCULAR | Status: DC | PRN
  Administered 2014-11-19: 10 mL

## 2014-11-19 MED ORDER — CEFAZOLIN SODIUM-DEXTROSE 2-3 GM-% IV SOLR
2.0000 g | INTRAVENOUS | Status: AC
  Administered 2014-11-19: 2 g via INTRAVENOUS
  Filled 2014-11-19: qty 50

## 2014-11-19 MED ORDER — LIDOCAINE HCL (CARDIAC) 20 MG/ML IV SOLN
INTRAVENOUS | Status: DC | PRN
  Administered 2014-11-19: 50 mg via INTRAVENOUS

## 2014-11-19 MED ORDER — LIDOCAINE-EPINEPHRINE (PF) 1 %-1:200000 IJ SOLN
INTRAMUSCULAR | Status: DC | PRN
  Administered 2014-11-19: 10 mL

## 2014-11-19 MED ORDER — KETAMINE HCL 50 MG/ML IJ SOLN
INTRAMUSCULAR | Status: AC
  Filled 2014-11-19: qty 10

## 2014-11-19 MED ORDER — FENTANYL CITRATE (PF) 100 MCG/2ML IJ SOLN
INTRAMUSCULAR | Status: AC
  Filled 2014-11-19: qty 2

## 2014-11-19 MED ORDER — HYDROCODONE-ACETAMINOPHEN 5-325 MG PO TABS: 1.0000 | ORAL_TABLET | Freq: Four times a day (QID) | ORAL | Status: DC | PRN

## 2014-11-19 MED ORDER — FENTANYL CITRATE (PF) 100 MCG/2ML IJ SOLN
INTRAMUSCULAR | Status: DC | PRN
  Administered 2014-11-19: 50 ug via INTRAVENOUS

## 2014-11-19 MED ORDER — LACTATED RINGERS IV SOLN
INTRAVENOUS | Status: DC
  Administered 2014-11-19 (×2): via INTRAVENOUS
  Filled 2014-11-19: qty 1000

## 2014-11-19 MED ORDER — CEPHALEXIN 500 MG PO CAPS: 500.0000 mg | ORAL_CAPSULE | Freq: Three times a day (TID) | ORAL | Status: DC

## 2014-11-19 MED ORDER — EPHEDRINE SULFATE 50 MG/ML IJ SOLN
INTRAMUSCULAR | Status: DC | PRN
  Administered 2014-11-19: 10 mg via INTRAVENOUS
  Administered 2014-11-19: 5 mg via INTRAVENOUS

## 2014-11-19 MED ORDER — PROPOFOL 500 MG/50ML IV EMUL
INTRAVENOUS | Status: DC | PRN
  Administered 2014-11-19: 140 ug/kg/min via INTRAVENOUS

## 2014-11-19 MED ORDER — SODIUM CHLORIDE 0.9 % IV SOLN
250.0000 mg | INTRAVENOUS | Status: DC | PRN
  Administered 2014-11-19: 10 ug/kg/min via INTRAVENOUS

## 2014-11-19 MED ORDER — PHENYLEPHRINE HCL 10 MG/ML IJ SOLN
INTRAMUSCULAR | Status: DC | PRN
  Administered 2014-11-19: 120 ug via INTRAVENOUS

## 2014-11-19 SURGICAL SUPPLY — 62 items
ANTNA NRSTM XTRN TELEM NS LF (UROLOGICAL SUPPLIES) ×1
APL SKNCLS STERI-STRIP NONHPOA (GAUZE/BANDAGES/DRESSINGS) ×2
BAG URINE DRAINAGE (UROLOGICAL SUPPLIES) ×2 IMPLANT
BAG URINE LEG 500ML (DRAIN) IMPLANT
BANDAGE ADHESIVE 1X3 (GAUZE/BANDAGES/DRESSINGS) IMPLANT
BENZOIN TINCTURE PRP APPL 2/3 (GAUZE/BANDAGES/DRESSINGS) ×4 IMPLANT
BLADE HEX COATED 2.75 (ELECTRODE) ×2 IMPLANT
BLADE SURG 15 STRL LF DISP TIS (BLADE) ×1 IMPLANT
BLADE SURG 15 STRL SS (BLADE) ×2
CATH FOLEY 2WAY SLVR  5CC 16FR (CATHETERS) ×1
CATH FOLEY 2WAY SLVR 5CC 16FR (CATHETERS) ×1 IMPLANT
CLOTH BEACON ORANGE TIMEOUT ST (SAFETY) ×2 IMPLANT
COVER BACK TABLE 60X90IN (DRAPES) ×2 IMPLANT
COVER MAYO STAND STRL (DRAPES) ×2 IMPLANT
COVER PROBE W GEL 5X96 (DRAPES) ×2 IMPLANT
DRAPE C-ARM 42X72 X-RAY (DRAPES) ×4 IMPLANT
DRAPE INCISE 23X17 IOBAN STRL (DRAPES) ×1
DRAPE INCISE 23X17 STRL (DRAPES) ×1 IMPLANT
DRAPE INCISE IOBAN 23X17 STRL (DRAPES) ×1 IMPLANT
DRAPE INCISE IOBAN 66X45 STRL (DRAPES) ×2 IMPLANT
DRAPE LAPAROSCOPIC ABDOMINAL (DRAPES) ×2 IMPLANT
DRAPE LG THREE QUARTER DISP (DRAPES) ×2 IMPLANT
DRSG TEGADERM 2-3/8X2-3/4 SM (GAUZE/BANDAGES/DRESSINGS) ×2 IMPLANT
DRSG TEGADERM 4X4.75 (GAUZE/BANDAGES/DRESSINGS) ×2 IMPLANT
DRSG TELFA 3X8 NADH (GAUZE/BANDAGES/DRESSINGS) ×2 IMPLANT
ELECT REM PT RETURN 9FT ADLT (ELECTROSURGICAL) ×2
ELECTRODE REM PT RTRN 9FT ADLT (ELECTROSURGICAL) ×1 IMPLANT
GLOVE BIO SURGEON STRL SZ7.5 (GLOVE) ×2 IMPLANT
GOWN STRL REUS W/ TWL LRG LVL3 (GOWN DISPOSABLE) ×1 IMPLANT
GOWN STRL REUS W/ TWL XL LVL3 (GOWN DISPOSABLE) ×1 IMPLANT
GOWN STRL REUS W/TWL LRG LVL3 (GOWN DISPOSABLE) ×2
GOWN STRL REUS W/TWL XL LVL3 (GOWN DISPOSABLE) ×2
HOLDER FOLEY CATH W/STRAP (MISCELLANEOUS) ×2 IMPLANT
INTRODUCER GUIDE DILATR SHEATH (SET/KITS/TRAYS/PACK) ×2 IMPLANT
LEAD (Lead) ×2 IMPLANT
LIQUID BAND (GAUZE/BANDAGES/DRESSINGS) ×2 IMPLANT
MANIFOLD NEPTUNE II (INSTRUMENTS) IMPLANT
NEEDLE FORAMEN 20GA 3.5  9CM (NEEDLE) IMPLANT
NEEDLE FORAMEN 20GA 5  12.5CM (NEEDLE) IMPLANT
NEEDLE HYPO 22GX1.5 SAFETY (NEEDLE) ×2 IMPLANT
PACK BASIN DAY SURGERY FS (CUSTOM PROCEDURE TRAY) ×2 IMPLANT
PAD DRESSING TELFA 3X8 NADH (GAUZE/BANDAGES/DRESSINGS) ×1 IMPLANT
PENCIL BUTTON HOLSTER BLD 10FT (ELECTRODE) ×2 IMPLANT
PROGRAMMER ANTENNA EXT (UROLOGICAL SUPPLIES) ×2 IMPLANT
PROGRAMMER STIMUL 2.2X1.1X3.7 (UROLOGICAL SUPPLIES) ×2 IMPLANT
SPONGE GAUZE 4X4 12PLY STER LF (GAUZE/BANDAGES/DRESSINGS) IMPLANT
STAPLER VISISTAT 35W (STAPLE) IMPLANT
STIMULATOR INTERSTIM 2X1.7X.3 (Orthopedic Implant) ×2 IMPLANT
STRIP CLOSURE SKIN 1/2X4 (GAUZE/BANDAGES/DRESSINGS) ×2 IMPLANT
SUCTION FRAZIER TIP 10 FR DISP (SUCTIONS) ×2 IMPLANT
SUT SILK 2 0 (SUTURE) ×2
SUT SILK 2-0 18XBRD TIE 12 (SUTURE) ×1 IMPLANT
SUT VIC AB 3-0 SH 27 (SUTURE) ×4
SUT VIC AB 3-0 SH 27X BRD (SUTURE) ×2 IMPLANT
SUT VICRYL 4-0 PS2 18IN ABS (SUTURE) ×5 IMPLANT
SYR BULB IRRIGATION 50ML (SYRINGE) ×2 IMPLANT
SYR CONTROL 10ML LL (SYRINGE) ×2 IMPLANT
SYRINGE 10CC LL (SYRINGE) ×2 IMPLANT
TOWEL OR 17X24 6PK STRL BLUE (TOWEL DISPOSABLE) ×4 IMPLANT
TRAY DSU PREP LF (CUSTOM PROCEDURE TRAY) ×2 IMPLANT
TUBE CONNECTING 12X1/4 (SUCTIONS) ×2 IMPLANT
WATER STERILE IRR 500ML POUR (IV SOLUTION) ×2 IMPLANT

## 2014-11-19 NOTE — Transfer of Care (Signed)
Immediate Anesthesia Transfer of Care Note  Patient: [name]  Procedure(s) Performed: Procedure(s) (LRB): INTERSTIM IMPLANT FIRST STAGE AND  (N/A) INTERSTIM IMPLANT SECOND STAGE (N/A)  Patient Location: PACU  Anesthesia Type: MAC  Level of Consciousness: awake, alert , oriented and patient cooperative  Airway & Oxygen Therapy: Patient Spontanous Breathing and Patient connected to face mask oxygen  Post-op Assessment: Report given to PACU RN and Post -op Vital signs reviewed and stable  Post vital signs: Reviewed and stable  Complications: No apparent anesthesia complications

## 2014-11-19 NOTE — Discharge Instructions (Signed)
I have reviewed discharge instructions in detail with the patient. They will follow-up with me or their physician as scheduled. My nurse will also be calling the patients as per protocol.  ° °CYSTOSCOPY HOME CARE INSTRUCTIONS ° °Activity: °Rest for the remainder of the day.  Do not drive or operate equipment today.  You may resume normal activities in one to two days as instructed by your physician.  ° °Meals: °Drink plenty of liquids and eat light foods such as gelatin or soup this evening.  You may return to a normal meal plan tomorrow. ° °Return to Work: °You may return to work in one to two days or as instructed by your physician. ° °Special Instructions / Symptoms: °Call your physician if any of these symptoms occur: ° ° -persistent or heavy bleeding ° -bleeding which continues after first few urination ° -large blood clots that are difficult to pass ° -urine stream diminishes or stops completely ° -fever equal to or higher than 101 degrees Farenheit. ° -cloudy urine with a strong, foul odor ° -severe pain ° °Females should always wipe from front to back after elimination.  You may feel some burning pain when you urinate.  This should disappear with time.  Applying moist heat to the lower abdomen or a hot tub bath may help relieve the pain. \ ° °Follow-Up / Date of Return Visit to Your Physician:  °Call for an appointment to arrange follow-up. ° °Patient Signature:  ________________________________________________________ ° °Nurse's Signature:  ________________________________________________________ ° °Post Anesthesia Home Care Instructions ° °Activity: °Get plenty of rest for the remainder of the day. A responsible adult should stay with you for 24 hours following the procedure.  °For the next 24 hours, DO NOT: °-Drive a car °-Operate machinery °-Drink alcoholic beverages °-Take any medication unless instructed by your physician °-Make any legal decisions or sign important papers. ° °Meals: °Start with liquid  foods such as gelatin or soup. Progress to regular foods as tolerated. Avoid greasy, spicy, heavy foods. If nausea and/or vomiting occur, drink only clear liquids until the nausea and/or vomiting subsides. Call your physician if vomiting continues. ° °Special Instructions/Symptoms: °Your throat may feel dry or sore from the anesthesia or the breathing tube placed in your throat during surgery. If this causes discomfort, gargle with warm salt water. The discomfort should disappear within 24 hours. ° °If you had a scopolamine patch placed behind your ear for the management of post- operative nausea and/or vomiting: ° °1. The medication in the patch is effective for 72 hours, after which it should be removed.  Wrap patch in a tissue and discard in the trash. Wash hands thoroughly with soap and water. °2. You may remove the patch earlier than 72 hours if you experience unpleasant side effects which may include dry mouth, dizziness or visual disturbances. °3. Avoid touching the patch. Wash your hands with soap and water after contact with the patch. °  ° °

## 2014-11-19 NOTE — Anesthesia Preprocedure Evaluation (Signed)
Anesthesia Evaluation  Patient identified by MRN, date of birth, ID band Patient awake    Reviewed: Allergy & Precautions, NPO status , Patient's Chart, lab work & pertinent test results  Airway Mallampati: II  TM Distance: >3 FB Neck ROM: Full    Dental no notable dental hx.    Pulmonary shortness of breath, sleep apnea , former smoker,    Pulmonary exam normal breath sounds clear to auscultation       Cardiovascular hypertension, Pt. on medications Normal cardiovascular exam+ dysrhythmias  Rhythm:Regular Rate:Normal     Neuro/Psych PSYCHIATRIC DISORDERS TIACVA    GI/Hepatic Neg liver ROS, GERD  ,  Endo/Other  diabetes, Type 2, Oral Hypoglycemic AgentsHypothyroidism   Renal/GU negative Renal ROS  negative genitourinary   Musculoskeletal  (+) Arthritis ,   Abdominal (+) + obese,   Peds negative pediatric ROS (+)  Hematology negative hematology ROS (+)   Anesthesia Other Findings   Reproductive/Obstetrics negative OB ROS                             Anesthesia Physical Anesthesia Plan  ASA: III  Anesthesia Plan: MAC   Post-op Pain Management:    Induction: Intravenous  Airway Management Planned: Natural Airway  Additional Equipment:   Intra-op Plan:   Post-operative Plan:   Informed Consent: I have reviewed the patients History and Physical, chart, labs and discussed the procedure including the risks, benefits and alternatives for the proposed anesthesia with the patient or authorized representative who has indicated his/her understanding and acceptance.   Dental advisory given  Plan Discussed with: CRNA  Anesthesia Plan Comments:         Anesthesia Quick Evaluation

## 2014-11-19 NOTE — Interval H&P Note (Signed)
@  BARCODE2D(Error - No data available.)@History  and Physical Interval Note:  11/19/2014 7:25 AM  [name]  has presented today for surgery, with the diagnosis of REFRACTORY URGENCY INCONTINENCE   The various methods of treatment have been discussed with the patient and family. After consideration of risks, benefits and other options for treatment, the patient has consented to  Procedure(s): INTERSTIM IMPLANT FIRST STAGE AND  (N/A) INTERSTIM IMPLANT SECOND STAGE (N/A) as a surgical intervention .  The patient's history has been reviewed, patient examined, no change in status, stable for surgery.  I have reviewed the patient's chart and labs.  Questions were answered to the patient's satisfaction.     MACDIARMID,SCOTT A

## 2014-11-19 NOTE — Op Note (Signed)
Preoperative diagnosis: Refractory urgency incontinence Postoperative diagnosis: Refractory urgency incontinence Surgery: InterStim stage I and stage II and impedance check Surgeon: Dr. Nicki Reaper MacDiarmid Assistant: Dr. Nelwyn Salisbury  The patient has the above diagnoses and consented to the above procedure. Extra care was taking positioning the patient in the supine position and wtih the skin preparation. Preoperative antibodies were given.  Using AP fluoroscopy I found the S3 foramina on the patient's right side passing the 3.5 inch foramen needle. She had excellent bellows and toe response.  The guide was placed to the appropriate depth. I initially instilled 10 mL of a lidocaine Marcaine mixture flooding the bone table and skin. Further anesthesia was used for  the skin. A 1 cm appropriate depth incision was made  Under fluoroscopic guidance the white trocar was passed to the mid bone level. The guide was removed.  The lead was passed pulling back on the wire to the appropriate depth. She had excellent toe response at low settings and position 0 and 1 and  excellent bellows and toe at position 2 and 3. AP and lateral x-rays were taken after disengaging the white trocar.  In my opinion the foramen that was negotiated was S3 and not S4 recognizing the lead looked lower on the AP view. Lengthy discussion and consideration was done intraoperatively. She had an acute angle to her sacrum.  At the appropriate depth I marked a 5 cm incision in the right upper buttock laterally. 10 mL of a lidocaine Marcaine mixture was utilized. Cautery and scalpel was used to develop a 1.5 cm appropriate sized pocket.  Using the passer the lead was brought from medial to lateral and negotiated appropriately into the IPG.  The IPG was placed in the pocket. Impedance check was performed. Impedance on position 0 was elevated. The lead was replaced with the same technique. Same findings were found. We felt this was  technical and I did not change out the lead. I suspect that the impedance will normalize over next 1-2 weeks.  The right buttock incision was closed with running 30 Vicryls subcutaneously. 40 Vicryls subcuticular was utilized. Interrupted 40 Vicryls were used for the midline incision. Sterile dressing was applied  Hopefully the procedure will reach the patient's treatment goal

## 2014-11-19 NOTE — Anesthesia Postprocedure Evaluation (Signed)
  Anesthesia Post-op Note  Patient: [name]  Procedure(s) Performed: Procedure(s) (LRB): INTERSTIM IMPLANT FIRST STAGE AND  (N/A) INTERSTIM IMPLANT SECOND STAGE (N/A)  Patient Location: PACU  Anesthesia Type: MAC  Level of Consciousness: awake and alert   Airway and Oxygen Therapy: Patient Spontanous Breathing  Post-op Pain: mild  Post-op Assessment: Post-op Vital signs reviewed, Patient's Cardiovascular Status Stable, Respiratory Function Stable, Patent Airway and No signs of Nausea or vomiting  Last Vitals:  Filed Vitals:   11/19/14 1445  BP: 145/84  Pulse: 71  Temp: 37.1 C  Resp: 18    Post-op Vital Signs: stable   Complications: No apparent anesthesia complications

## 2014-11-20 ENCOUNTER — Encounter (HOSPITAL_BASED_OUTPATIENT_CLINIC_OR_DEPARTMENT_OTHER): Payer: Self-pay | Admitting: Urology

## 2014-11-26 ENCOUNTER — Institutional Professional Consult (permissible substitution): Payer: Self-pay | Admitting: Neurology

## 2014-11-29 ENCOUNTER — Emergency Department (HOSPITAL_BASED_OUTPATIENT_CLINIC_OR_DEPARTMENT_OTHER): Payer: Medicare Other

## 2014-11-29 ENCOUNTER — Emergency Department (HOSPITAL_BASED_OUTPATIENT_CLINIC_OR_DEPARTMENT_OTHER)
Admission: EM | Admit: 2014-11-29 | Discharge: 2014-11-30 | Disposition: A | Discharge status: Home / Self Care | Payer: Medicare Other | Attending: Emergency Medicine | Admitting: Emergency Medicine

## 2014-11-29 ENCOUNTER — Encounter (HOSPITAL_BASED_OUTPATIENT_CLINIC_OR_DEPARTMENT_OTHER): Payer: Self-pay | Admitting: *Deleted

## 2014-11-29 DIAGNOSIS — W19XXXA Unspecified fall, initial encounter: Secondary | ICD-10-CM

## 2014-11-29 DIAGNOSIS — Z7902 Long term (current) use of antithrombotics/antiplatelets: Secondary | ICD-10-CM | POA: Diagnosis not present

## 2014-11-29 DIAGNOSIS — Y9389 Activity, other specified: Secondary | ICD-10-CM | POA: Diagnosis not present

## 2014-11-29 DIAGNOSIS — Z87442 Personal history of urinary calculi: Secondary | ICD-10-CM | POA: Insufficient documentation

## 2014-11-29 DIAGNOSIS — E039 Hypothyroidism, unspecified: Secondary | ICD-10-CM | POA: Insufficient documentation

## 2014-11-29 DIAGNOSIS — W01198A Fall on same level from slipping, tripping and stumbling with subsequent striking against other object, initial encounter: Secondary | ICD-10-CM | POA: Diagnosis not present

## 2014-11-29 DIAGNOSIS — Z859 Personal history of malignant neoplasm, unspecified: Secondary | ICD-10-CM | POA: Insufficient documentation

## 2014-11-29 DIAGNOSIS — Z8673 Personal history of transient ischemic attack (TIA), and cerebral infarction without residual deficits: Secondary | ICD-10-CM | POA: Diagnosis not present

## 2014-11-29 DIAGNOSIS — S3992XA Unspecified injury of lower back, initial encounter: Secondary | ICD-10-CM | POA: Diagnosis not present

## 2014-11-29 DIAGNOSIS — M1990 Unspecified osteoarthritis, unspecified site: Secondary | ICD-10-CM | POA: Insufficient documentation

## 2014-11-29 DIAGNOSIS — Z8719 Personal history of other diseases of the digestive system: Secondary | ICD-10-CM | POA: Diagnosis not present

## 2014-11-29 DIAGNOSIS — Z87891 Personal history of nicotine dependence: Secondary | ICD-10-CM | POA: Diagnosis not present

## 2014-11-29 DIAGNOSIS — Z87448 Personal history of other diseases of urinary system: Secondary | ICD-10-CM | POA: Insufficient documentation

## 2014-11-29 DIAGNOSIS — I10 Essential (primary) hypertension: Secondary | ICD-10-CM | POA: Insufficient documentation

## 2014-11-29 DIAGNOSIS — F0390 Unspecified dementia without behavioral disturbance: Secondary | ICD-10-CM | POA: Diagnosis not present

## 2014-11-29 DIAGNOSIS — E119 Type 2 diabetes mellitus without complications: Secondary | ICD-10-CM | POA: Insufficient documentation

## 2014-11-29 DIAGNOSIS — Z79899 Other long term (current) drug therapy: Secondary | ICD-10-CM | POA: Diagnosis not present

## 2014-11-29 DIAGNOSIS — S5002XA Contusion of left elbow, initial encounter: Secondary | ICD-10-CM | POA: Diagnosis not present

## 2014-11-29 DIAGNOSIS — Z792 Long term (current) use of antibiotics: Secondary | ICD-10-CM | POA: Insufficient documentation

## 2014-11-29 DIAGNOSIS — M7981 Nontraumatic hematoma of soft tissue: Secondary | ICD-10-CM | POA: Diagnosis not present

## 2014-11-29 DIAGNOSIS — R51 Headache: Secondary | ICD-10-CM | POA: Diagnosis not present

## 2014-11-29 DIAGNOSIS — Y998 Other external cause status: Secondary | ICD-10-CM | POA: Diagnosis not present

## 2014-11-29 DIAGNOSIS — Z9889 Other specified postprocedural states: Secondary | ICD-10-CM | POA: Insufficient documentation

## 2014-11-29 DIAGNOSIS — E785 Hyperlipidemia, unspecified: Secondary | ICD-10-CM | POA: Insufficient documentation

## 2014-11-29 DIAGNOSIS — S0003XA Contusion of scalp, initial encounter: Secondary | ICD-10-CM | POA: Diagnosis not present

## 2014-11-29 DIAGNOSIS — Z8669 Personal history of other diseases of the nervous system and sense organs: Secondary | ICD-10-CM | POA: Insufficient documentation

## 2014-11-29 DIAGNOSIS — S0990XA Unspecified injury of head, initial encounter: Secondary | ICD-10-CM | POA: Diagnosis present

## 2014-11-29 DIAGNOSIS — Y92090 Kitchen in other non-institutional residence as the place of occurrence of the external cause: Secondary | ICD-10-CM | POA: Diagnosis not present

## 2014-11-29 MED ORDER — HYDROCODONE-ACETAMINOPHEN 5-325 MG PO TABS: 1.0000 | ORAL_TABLET | ORAL | Status: DC | PRN

## 2014-11-29 MED ORDER — ACETAMINOPHEN 325 MG PO TABS
650.0000 mg | ORAL_TABLET | Freq: Once | ORAL | Status: AC
  Administered 2014-11-29: 650 mg via ORAL
  Filled 2014-11-29: qty 2

## 2014-11-29 MED ORDER — HYDROCODONE-ACETAMINOPHEN 5-325 MG PO TABS
1.0000 | ORAL_TABLET | Freq: Once | ORAL | Status: AC
  Administered 2014-11-29: 1 via ORAL
  Filled 2014-11-29: qty 1

## 2014-11-29 NOTE — ED Notes (Signed)
Pt fell   Denies loc,  C/o some pain to back and back of head  Left elbow pain w large hematoma   Per family pt acting normal

## 2014-11-29 NOTE — ED Notes (Signed)
Lost her balance and fell an hour ago. Injury to her left elbow, and she hit the back of her head. Back pain.

## 2014-11-29 NOTE — Discharge Instructions (Signed)
Elbow Contusion °An elbow contusion is a deep bruise of the elbow. Contusions are the result of an injury that caused bleeding under the skin. The contusion may turn blue, purple, or yellow. Minor injuries will give you a painless contusion, but more severe contusions may stay painful and swollen for a few weeks.  °CAUSES  °An elbow contusion comes from a direct force to that area, such as falling on the elbow. °SYMPTOMS  °· Swelling and redness of the elbow. °· Bruising of the elbow area. °· Tenderness or soreness of the elbow. °DIAGNOSIS  °You will have a physical exam and will be asked about your history. You may need an X-ray of your elbow to look for a broken bone (fracture).  °TREATMENT  °A sling or splint may be needed to support your injury. Resting, elevating, and applying cold compresses to the elbow area are often the best treatments for an elbow contusion. Over-the-counter medicines may also be recommended for pain control. °HOME CARE INSTRUCTIONS  °· Put ice on the injured area. °¨ Put ice in a plastic bag. °¨ Place a towel between your skin and the bag. °¨ Leave the ice on for 15-20 minutes, 03-04 times a day. °· Only take over-the-counter or prescription medicines for pain, discomfort, or fever as directed by your caregiver. °· Rest your injured elbow until the pain and swelling are better. °· Elevate your elbow to reduce swelling. °· Apply a compression wrap as directed by your caregiver. This can help reduce swelling and motion. You may remove the wrap for sleeping, showers, and baths. If your fingers become numb, cold, or blue, take the wrap off and reapply it more loosely. °· Use your elbow only as directed by your caregiver. You may be asked to do range of motion exercises. Do them as directed. °· See your caregiver as directed. It is very important to keep all follow-up appointments in order to avoid any long-term problems with your elbow, including chronic pain or inability to move your elbow  normally. °SEEK IMMEDIATE MEDICAL CARE IF:  °· You have increased redness, swelling, or pain in your elbow. °· Your swelling or pain is not relieved with medicines. °· You have swelling of the hand and fingers. °· You are unable to move your fingers or wrist. °· You begin to lose feeling in your hand or fingers. °· Your fingers or hand become cold or blue. °MAKE SURE YOU:  °· Understand these instructions. °· Will watch your condition. °· Will get help right away if you are not doing well or get worse. °  °This information is not intended to replace advice given to you by your health care provider. Make sure you discuss any questions you have with your health care provider. °  °Document Released: 01/17/2006 Document Revised: 05/03/2011 Document Reviewed: 09/23/2014 °Elsevier Interactive Patient Education ©2016 [employer] ° °

## 2014-11-29 NOTE — ED Provider Notes (Addendum)
CSN: [id number]     Arrival date & time 11/29/14  2048 History  By signing my name below, I, [name], attest that this documentation has been prepared under the direction and in the presence of Blanchie Dessert, MD. Electronically Signed: Meriel Howell, ED Scribe. 11/29/2014. 10:29 PM.   Chief Complaint  Patient presents with  . Fall    The history is provided by the patient. No language interpreter was used.   HPI Comments: [name] is a 80 y.o. female, with a significant PMhx, who presents to the Emergency Department complaining of sudden onset, constant left elbow pain and mild bilateral lumbar back pain (which is no different than her baseline) s/p fall that occurred 2 hours ago. Pt reports she was in the kitchen getting something to eat in the refrigerator when she slipped and fell backwards hitting the back of her head and left elbow on a linoleum floor. She was able to lift herself up off the floor and call for the help of her neighbor. She has associated moderate ecchymosis to anterior left elbow and hematoma to left side of scalp. Denies headache or LOC, neck pain, CP or SOB, or any other arthralgias. Pt not taking blood thinning medication. The pt had Interstim implant placement 10 days ago at [facility] by Bjorn Loser, MD and has pain medication at home.   Past Medical History  Diagnosis Date  . Hyperlipidemia   . Hypertension   . Hypothyroidism   . GERD (gastroesophageal reflux disease)   . Arthritis   . Bifascicular bundle branch block     chronic  . History of CVA (cerebrovascular accident)     03-31-2012--  right cerebellar infart without hemorrhgia  . Type 2 diabetes mellitus   . History of kidney stones   . Urge urinary incontinence     refactory  . Polymyalgia rheumatica   . Degenerative spinal arthritis     cervical and lumbar  . History of squamous cell carcinoma excision   . History of gastric ulcer     2011  w/ bleed  . Abnormality of gait   .  Obstructive sleep apnea     has not been using -- but states has appointment soon to get refitted   . Dementia     early onset  . Dyspnea on exertion    Past Surgical History  Procedure Laterality Date  . Total knee arthroplasty Left 11-12-2004  . Tee without cardioversion N/A 09/12/2012    Procedure: TRANSESOPHAGEAL ECHOCARDIOGRAM (TEE);  Surgeon: Lelon Perla, MD;  Location: [facility] ENDOSCOPY;  Service: Cardiovascular;  Laterality: N/A;  proximal septal thickening and chordal SAM creatin a narrow LVOT; turbulence noted  . Orif right wrist fx  03-04-2004  . Shoulder arthroscopy / debridement/  sad/  dcr/  mini open rotator cuff repair Right 01-09-2003  . Transthoracic echocardiogram  09-10-2012    mild LVH, mild  focal basal hypertrophy of the septum,  ef 90-24%, grade I diastolic dysfunction/  trival AR /  MV with moderate systolic anterior motion of the chordal structures/  mild LAE  . Hernia repair  1956  . Tonsillectomy  as child  . Appendectomy  as child  . Interstim implant placement N/A 11/19/2014    Procedure: INTERSTIM IMPLANT FIRST STAGE AND ;  Surgeon: Bjorn Loser, MD;  Location: [facility];  Service: Urology;  Laterality: N/A;  . Interstim implant placement N/A 11/19/2014    Procedure: INTERSTIM IMPLANT SECOND STAGE;  Surgeon:  Bjorn Loser, MD;  Location: [facility];  Service: Urology;  Laterality: N/A;   Family History  Problem Relation Age of Onset  . Heart failure Mother    Social History  Substance Use Topics  . Smoking status: Former Smoker    Quit date: 11/02/2004  . Smokeless tobacco: Never Used  . Alcohol Use: No   OB History    No data available     Review of Systems  Respiratory: Negative for shortness of breath.   Cardiovascular: Negative for chest pain.  Musculoskeletal: Positive for back pain ( lumbar) and arthralgias ( left elbow). Negative for neck pain.  Skin: Positive for color change (ecchymosis to left  elbow).  Neurological: Negative for syncope and headaches.  Hematological: Does not bruise/bleed easily.  10 Systems reviewed and all are negative for acute change except as noted in the HPI.  Allergies  Adhesive  Home Medications   Prior to Admission medications   Medication Sig Start Date End Date Taking? Authorizing Provider  acetaminophen (TYLENOL) 325 MG tablet Take 650 mg by mouth every 6 (six) hours as needed.    Historical Provider, MD  atorvastatin (LIPITOR) 40 MG tablet Take 1 tablet (40 mg total) by mouth daily. Patient taking differently: Take 40 mg by mouth every evening.  07/25/14   Peter M Martinique, MD  benazepril (LOTENSIN) 40 MG tablet Take 40 mg by mouth daily.    Historical Provider, MD  cephALEXin (KEFLEX) 500 MG capsule Take 1 capsule (500 mg total) by mouth 3 (three) times daily. 11/19/14   Bjorn Loser, MD  Cholecalciferol 5000 UNITS capsule Take 1 capsule (5,000 Units total) by mouth daily. Patient taking differently: Take 5,000 Units by mouth every morning.  04/10/12   Lavon Paganini Angiulli, PA-C  clopidogrel (PLAVIX) 75 MG tablet Take 1 tablet (75 mg total) by mouth daily with breakfast. 09/12/12   Nishant Dhungel, MD  donepezil (ARICEPT) 5 MG tablet Take 1 tablet (5 mg total) by mouth at bedtime. 04/10/12   Lavon Paganini Angiulli, PA-C  DULoxetine (CYMBALTA) 60 MG capsule Take 60 mg by mouth every morning.    Historical Provider, MD  Fluticasone Furoate-Vilanterol (BREO ELLIPTA) 100-25 MCG/INH AEPB Inhale into the lungs every evening.     Historical Provider, MD  folic acid (FOLVITE) 1 MG tablet Take 1 tablet (1 mg total) by mouth 2 (two) times daily. 04/10/12   Lavon Paganini Angiulli, PA-C  glimepiride (AMARYL) 1 MG tablet Take 1 mg by mouth daily with breakfast.    Historical Provider, MD  HYDROcodone-acetaminophen (NORCO) 5-325 MG tablet Take 1-2 tablets by mouth every 6 (six) hours as needed for moderate pain. 11/19/14   Bjorn Loser, MD  lamoTRIgine (LAMICTAL) 150 MG tablet  Take 150 mg by mouth every morning.    Historical Provider, MD  levothyroxine (SYNTHROID, LEVOTHROID) 100 MCG tablet Take 1 tablet (100 mcg total) by mouth daily. 04/10/12   Lavon Paganini Angiulli, PA-C  Lurasidone HCl (LATUDA) 20 MG TABS Take 1 tablet by mouth every evening.    Historical Provider, MD  vitamin B-12 (CYANOCOBALAMIN) 1000 MCG tablet Take 1 tablet (1,000 mcg total) by mouth daily. Patient taking differently: Take 1,000 mcg by mouth every morning.  04/10/12   Lavon Paganini Angiulli, PA-C   BP 134/72 mmHg  Pulse 101  Temp(Src) 98.2 F (36.8 C) (Oral)  Resp 20  Ht 5\' 2"  (1.575 m)  Wt 203 lb (92.08 kg)  BMI 37.12 kg/m2  SpO2 91% Physical Exam  Constitutional: She is oriented to person, place, and time. She appears well-developed and well-nourished.  HENT:  Head: Normocephalic and atraumatic.  Eyes: EOM are normal. Pupils are equal, round, and reactive to light.  Neck: Neck supple.  Cardiovascular: Normal rate, regular rhythm, normal heart sounds and intact distal pulses.   No murmur heard. Pulmonary/Chest: Effort normal and breath sounds normal. No respiratory distress. She has no wheezes.  Abdominal: Soft. There is no tenderness.  Musculoskeletal: Normal range of motion. She exhibits tenderness. She exhibits no edema.  Hematoma to left, occipital area that is mildly TTP. Left arm; golf ball sized hematoma and ecchymosis present on the medial epicondyl of the elbow, full flexion and extension of the elbow and supination and pronation of the hand without difficulty. Lumbar spine with surgical dressing in place with healing ecchymosis, no cervical, thoracic or lumbar spinal tenderness. No evidence of trauma to BLE.   Neurological: She is alert and oriented to person, place, and time. No cranial nerve deficit.  Skin: Skin is warm and dry.  Psychiatric: She has a normal mood and affect.  Nursing note and vitals reviewed.   ED Course  Procedures  DIAGNOSTIC STUDIES: Oxygen Saturation is  91% on RA, low by my interpretation.    COORDINATION OF CARE: 9:52 PM Discussed treatment plan with pt at bedside and pt agreed to plan.   Imaging Review Dg Elbow Complete Left  11/29/2014   CLINICAL DATA:  Left elbow injury with medial hematoma.  EXAM: LEFT ELBOW - COMPLETE 3+ VIEW  COMPARISON:  None.  FINDINGS: There is no evidence of fracture, dislocation, or joint effusion. There is no evidence of arthropathy or other focal bone abnormality. There is medial soft tissue hematoma.  IMPRESSION: No acute fracture or dislocation identified about the left elbow.  Soft tissue hematoma.   Electronically Signed   By: Fidela Salisbury M.D.   On: 11/29/2014 22:42   Ct Head Wo Contrast  11/29/2014   CLINICAL DATA:  Sudden onset posterior head pain after falling 2 hr ago.  EXAM: CT HEAD WITHOUT CONTRAST  TECHNIQUE: Contiguous axial images were obtained from the base of the skull through the vertex without intravenous contrast.  COMPARISON:  09/08/2012  FINDINGS: There is no intracranial hemorrhage, mass or evidence of acute infarction. There is no extra-axial fluid collection. There is severe generalized atrophy. There is severe white matter hypodensity consistent with chronic small vessel disease. There is remote right cerebellar infarction no interval change is evident from 09/08/2012.  Visible paranasal sinuses are clear. No bone abnormality is evident.  IMPRESSION: Severe atrophy, chronic right cerebellar infarction and chronic small vessel ischemic disease. No acute findings.   Electronically Signed   By: Andreas Newport M.D.   On: 11/29/2014 22:46   I have personally reviewed and evaluated these images results as part of my medical decision-making.    MDM   Final diagnoses:  Fall, initial encounter  Traumatic hematoma of left elbow, initial encounter  Scalp hematoma, initial encounter    Patient is a 80 year old female with a mechanical fall at home when she slipped on her kitchen floor  hitting her left elbow and head. She denies any syncope, dizziness or chest pain prior to the fall. No LOC after hitting her head. She she denies any blood thinners however it does appear that she is on Plavix. She has no C-spine tenderness. Recent interstim placed 10 days ago the dressings are dry without bleeding. Healing ecchymosis and no significant tenderness. No  spinal tenderness.  Large ecchymosis and tenderness over the left medial epicondyle.  No new tenderness or issues with the hips. Patient was able to walk.  CT of the head and elbow imaging pending.  Patient given Tylenol for pain.  11:23 PM Imaging is neg and pt was d/ced home.  I personally performed the services described in this documentation, which was scribed in my presence.  The recorded information has been reviewed and considered.    Blanchie Dessert, MD 11/29/14 8333  Blanchie Dessert, MD 11/29/14 2324

## 2014-12-03 DIAGNOSIS — N3946 Mixed incontinence: Secondary | ICD-10-CM | POA: Diagnosis not present

## 2014-12-03 DIAGNOSIS — R351 Nocturia: Secondary | ICD-10-CM | POA: Diagnosis not present

## 2014-12-09 DIAGNOSIS — F3181 Bipolar II disorder: Secondary | ICD-10-CM | POA: Diagnosis not present

## 2014-12-11 ENCOUNTER — Institutional Professional Consult (permissible substitution): Payer: Self-pay | Admitting: Neurology

## 2015-01-27 DIAGNOSIS — E784 Other hyperlipidemia: Secondary | ICD-10-CM | POA: Diagnosis not present

## 2015-01-27 DIAGNOSIS — E038 Other specified hypothyroidism: Secondary | ICD-10-CM | POA: Diagnosis not present

## 2015-01-27 DIAGNOSIS — M810 Age-related osteoporosis without current pathological fracture: Secondary | ICD-10-CM | POA: Diagnosis not present

## 2015-01-27 DIAGNOSIS — E538 Deficiency of other specified B group vitamins: Secondary | ICD-10-CM | POA: Diagnosis not present

## 2015-01-27 DIAGNOSIS — E1140 Type 2 diabetes mellitus with diabetic neuropathy, unspecified: Secondary | ICD-10-CM | POA: Diagnosis not present

## 2015-01-31 DIAGNOSIS — Z23 Encounter for immunization: Secondary | ICD-10-CM | POA: Diagnosis not present

## 2015-01-31 DIAGNOSIS — J449 Chronic obstructive pulmonary disease, unspecified: Secondary | ICD-10-CM | POA: Diagnosis not present

## 2015-01-31 DIAGNOSIS — R2689 Other abnormalities of gait and mobility: Secondary | ICD-10-CM | POA: Diagnosis not present

## 2015-01-31 DIAGNOSIS — Z6834 Body mass index (BMI) 34.0-34.9, adult: Secondary | ICD-10-CM | POA: Diagnosis not present

## 2015-01-31 DIAGNOSIS — M1990 Unspecified osteoarthritis, unspecified site: Secondary | ICD-10-CM | POA: Diagnosis not present

## 2015-01-31 DIAGNOSIS — E1140 Type 2 diabetes mellitus with diabetic neuropathy, unspecified: Secondary | ICD-10-CM | POA: Diagnosis not present

## 2015-01-31 DIAGNOSIS — I6990 Unspecified sequelae of unspecified cerebrovascular disease: Secondary | ICD-10-CM | POA: Diagnosis not present

## 2015-01-31 DIAGNOSIS — N183 Chronic kidney disease, stage 3 (moderate): Secondary | ICD-10-CM | POA: Diagnosis not present

## 2015-01-31 DIAGNOSIS — L209 Atopic dermatitis, unspecified: Secondary | ICD-10-CM | POA: Diagnosis not present

## 2015-01-31 DIAGNOSIS — R600 Localized edema: Secondary | ICD-10-CM | POA: Diagnosis not present

## 2015-01-31 DIAGNOSIS — Z0000 Encounter for general adult medical examination without abnormal findings: Secondary | ICD-10-CM | POA: Diagnosis not present

## 2015-01-31 DIAGNOSIS — R252 Cramp and spasm: Secondary | ICD-10-CM | POA: Diagnosis not present

## 2015-01-31 DIAGNOSIS — R454 Irritability and anger: Secondary | ICD-10-CM | POA: Diagnosis not present

## 2015-03-17 DIAGNOSIS — F3181 Bipolar II disorder: Secondary | ICD-10-CM | POA: Diagnosis not present

## 2015-04-14 DIAGNOSIS — F3181 Bipolar II disorder: Secondary | ICD-10-CM | POA: Diagnosis not present

## 2015-05-14 DIAGNOSIS — F3181 Bipolar II disorder: Secondary | ICD-10-CM | POA: Diagnosis not present

## 2015-05-15 ENCOUNTER — Other Ambulatory Visit: Payer: Self-pay | Admitting: Cardiology

## 2015-05-15 NOTE — Telephone Encounter (Signed)
Rx(s) sent to pharmacy electronically.  

## 2015-06-09 DIAGNOSIS — F3181 Bipolar II disorder: Secondary | ICD-10-CM | POA: Diagnosis not present

## 2015-06-11 DIAGNOSIS — F3181 Bipolar II disorder: Secondary | ICD-10-CM | POA: Diagnosis not present

## 2015-06-17 ENCOUNTER — Other Ambulatory Visit: Payer: Self-pay | Admitting: Cardiology

## 2015-06-17 DIAGNOSIS — R2689 Other abnormalities of gait and mobility: Secondary | ICD-10-CM | POA: Diagnosis not present

## 2015-06-17 DIAGNOSIS — Z6834 Body mass index (BMI) 34.0-34.9, adult: Secondary | ICD-10-CM | POA: Diagnosis not present

## 2015-06-17 DIAGNOSIS — D692 Other nonthrombocytopenic purpura: Secondary | ICD-10-CM | POA: Diagnosis not present

## 2015-06-17 DIAGNOSIS — M353 Polymyalgia rheumatica: Secondary | ICD-10-CM | POA: Diagnosis not present

## 2015-06-17 DIAGNOSIS — E038 Other specified hypothyroidism: Secondary | ICD-10-CM | POA: Diagnosis not present

## 2015-06-17 DIAGNOSIS — R454 Irritability and anger: Secondary | ICD-10-CM | POA: Diagnosis not present

## 2015-06-17 DIAGNOSIS — R413 Other amnesia: Secondary | ICD-10-CM | POA: Diagnosis not present

## 2015-06-17 DIAGNOSIS — N183 Chronic kidney disease, stage 3 (moderate): Secondary | ICD-10-CM | POA: Diagnosis not present

## 2015-06-17 DIAGNOSIS — R627 Adult failure to thrive: Secondary | ICD-10-CM | POA: Diagnosis not present

## 2015-06-17 DIAGNOSIS — J449 Chronic obstructive pulmonary disease, unspecified: Secondary | ICD-10-CM | POA: Diagnosis not present

## 2015-06-17 DIAGNOSIS — E1140 Type 2 diabetes mellitus with diabetic neuropathy, unspecified: Secondary | ICD-10-CM | POA: Diagnosis not present

## 2015-06-17 DIAGNOSIS — R600 Localized edema: Secondary | ICD-10-CM | POA: Diagnosis not present

## 2015-06-26 DIAGNOSIS — M6281 Muscle weakness (generalized): Secondary | ICD-10-CM | POA: Diagnosis not present

## 2015-06-26 DIAGNOSIS — M545 Low back pain: Secondary | ICD-10-CM | POA: Diagnosis not present

## 2015-06-26 DIAGNOSIS — R2681 Unsteadiness on feet: Secondary | ICD-10-CM | POA: Diagnosis not present

## 2015-06-26 DIAGNOSIS — R262 Difficulty in walking, not elsewhere classified: Secondary | ICD-10-CM | POA: Diagnosis not present

## 2015-06-30 DIAGNOSIS — R262 Difficulty in walking, not elsewhere classified: Secondary | ICD-10-CM | POA: Diagnosis not present

## 2015-06-30 DIAGNOSIS — M545 Low back pain: Secondary | ICD-10-CM | POA: Diagnosis not present

## 2015-06-30 DIAGNOSIS — M6281 Muscle weakness (generalized): Secondary | ICD-10-CM | POA: Diagnosis not present

## 2015-06-30 DIAGNOSIS — R2681 Unsteadiness on feet: Secondary | ICD-10-CM | POA: Diagnosis not present

## 2015-08-13 DIAGNOSIS — L819 Disorder of pigmentation, unspecified: Secondary | ICD-10-CM | POA: Diagnosis not present

## 2015-08-18 DIAGNOSIS — F3181 Bipolar II disorder: Secondary | ICD-10-CM | POA: Diagnosis not present

## 2015-08-27 DIAGNOSIS — M79674 Pain in right toe(s): Secondary | ICD-10-CM | POA: Diagnosis not present

## 2015-08-27 DIAGNOSIS — L03031 Cellulitis of right toe: Secondary | ICD-10-CM | POA: Diagnosis not present

## 2015-08-27 DIAGNOSIS — M25571 Pain in right ankle and joints of right foot: Secondary | ICD-10-CM | POA: Diagnosis not present

## 2015-09-05 DIAGNOSIS — M1711 Unilateral primary osteoarthritis, right knee: Secondary | ICD-10-CM | POA: Diagnosis not present

## 2015-09-05 DIAGNOSIS — M25562 Pain in left knee: Secondary | ICD-10-CM | POA: Diagnosis not present

## 2015-09-09 DIAGNOSIS — F3181 Bipolar II disorder: Secondary | ICD-10-CM | POA: Diagnosis not present

## 2015-09-12 DIAGNOSIS — M79674 Pain in right toe(s): Secondary | ICD-10-CM | POA: Diagnosis not present

## 2015-09-12 DIAGNOSIS — B351 Tinea unguium: Secondary | ICD-10-CM | POA: Diagnosis not present

## 2015-09-12 DIAGNOSIS — M79675 Pain in left toe(s): Secondary | ICD-10-CM | POA: Diagnosis not present

## 2015-09-25 DIAGNOSIS — E038 Other specified hypothyroidism: Secondary | ICD-10-CM | POA: Diagnosis not present

## 2015-09-25 DIAGNOSIS — Z1389 Encounter for screening for other disorder: Secondary | ICD-10-CM | POA: Diagnosis not present

## 2015-09-25 DIAGNOSIS — E1140 Type 2 diabetes mellitus with diabetic neuropathy, unspecified: Secondary | ICD-10-CM | POA: Diagnosis not present

## 2015-09-25 DIAGNOSIS — M1990 Unspecified osteoarthritis, unspecified site: Secondary | ICD-10-CM | POA: Diagnosis not present

## 2015-09-25 DIAGNOSIS — Z6833 Body mass index (BMI) 33.0-33.9, adult: Secondary | ICD-10-CM | POA: Diagnosis not present

## 2015-09-25 DIAGNOSIS — I10 Essential (primary) hypertension: Secondary | ICD-10-CM | POA: Diagnosis not present

## 2015-09-25 DIAGNOSIS — I129 Hypertensive chronic kidney disease with stage 1 through stage 4 chronic kidney disease, or unspecified chronic kidney disease: Secondary | ICD-10-CM | POA: Diagnosis not present

## 2015-09-25 DIAGNOSIS — R2689 Other abnormalities of gait and mobility: Secondary | ICD-10-CM | POA: Diagnosis not present

## 2015-09-25 DIAGNOSIS — D692 Other nonthrombocytopenic purpura: Secondary | ICD-10-CM | POA: Diagnosis not present

## 2015-09-25 DIAGNOSIS — R627 Adult failure to thrive: Secondary | ICD-10-CM | POA: Diagnosis not present

## 2015-09-25 DIAGNOSIS — N183 Chronic kidney disease, stage 3 (moderate): Secondary | ICD-10-CM | POA: Diagnosis not present

## 2015-09-26 DIAGNOSIS — M25552 Pain in left hip: Secondary | ICD-10-CM | POA: Diagnosis not present

## 2015-09-26 DIAGNOSIS — M1990 Unspecified osteoarthritis, unspecified site: Secondary | ICD-10-CM | POA: Diagnosis not present

## 2015-09-26 DIAGNOSIS — M5136 Other intervertebral disc degeneration, lumbar region: Secondary | ICD-10-CM | POA: Diagnosis not present

## 2015-09-26 DIAGNOSIS — I129 Hypertensive chronic kidney disease with stage 1 through stage 4 chronic kidney disease, or unspecified chronic kidney disease: Secondary | ICD-10-CM | POA: Diagnosis not present

## 2015-09-26 DIAGNOSIS — M353 Polymyalgia rheumatica: Secondary | ICD-10-CM | POA: Diagnosis not present

## 2015-09-26 DIAGNOSIS — E1140 Type 2 diabetes mellitus with diabetic neuropathy, unspecified: Secondary | ICD-10-CM | POA: Diagnosis not present

## 2015-09-30 DIAGNOSIS — M353 Polymyalgia rheumatica: Secondary | ICD-10-CM | POA: Diagnosis not present

## 2015-09-30 DIAGNOSIS — M25552 Pain in left hip: Secondary | ICD-10-CM | POA: Diagnosis not present

## 2015-09-30 DIAGNOSIS — I129 Hypertensive chronic kidney disease with stage 1 through stage 4 chronic kidney disease, or unspecified chronic kidney disease: Secondary | ICD-10-CM | POA: Diagnosis not present

## 2015-09-30 DIAGNOSIS — E1140 Type 2 diabetes mellitus with diabetic neuropathy, unspecified: Secondary | ICD-10-CM | POA: Diagnosis not present

## 2015-09-30 DIAGNOSIS — M5136 Other intervertebral disc degeneration, lumbar region: Secondary | ICD-10-CM | POA: Diagnosis not present

## 2015-09-30 DIAGNOSIS — M1990 Unspecified osteoarthritis, unspecified site: Secondary | ICD-10-CM | POA: Diagnosis not present

## 2015-10-01 DIAGNOSIS — M25552 Pain in left hip: Secondary | ICD-10-CM | POA: Diagnosis not present

## 2015-10-01 DIAGNOSIS — M353 Polymyalgia rheumatica: Secondary | ICD-10-CM | POA: Diagnosis not present

## 2015-10-01 DIAGNOSIS — M1990 Unspecified osteoarthritis, unspecified site: Secondary | ICD-10-CM | POA: Diagnosis not present

## 2015-10-01 DIAGNOSIS — I129 Hypertensive chronic kidney disease with stage 1 through stage 4 chronic kidney disease, or unspecified chronic kidney disease: Secondary | ICD-10-CM | POA: Diagnosis not present

## 2015-10-01 DIAGNOSIS — E1140 Type 2 diabetes mellitus with diabetic neuropathy, unspecified: Secondary | ICD-10-CM | POA: Diagnosis not present

## 2015-10-01 DIAGNOSIS — M5136 Other intervertebral disc degeneration, lumbar region: Secondary | ICD-10-CM | POA: Diagnosis not present

## 2015-10-02 DIAGNOSIS — N183 Chronic kidney disease, stage 3 (moderate): Secondary | ICD-10-CM | POA: Diagnosis not present

## 2015-10-02 DIAGNOSIS — I129 Hypertensive chronic kidney disease with stage 1 through stage 4 chronic kidney disease, or unspecified chronic kidney disease: Secondary | ICD-10-CM | POA: Diagnosis not present

## 2015-10-02 DIAGNOSIS — M25552 Pain in left hip: Secondary | ICD-10-CM | POA: Diagnosis not present

## 2015-10-02 DIAGNOSIS — M1990 Unspecified osteoarthritis, unspecified site: Secondary | ICD-10-CM | POA: Diagnosis not present

## 2015-10-02 DIAGNOSIS — M5136 Other intervertebral disc degeneration, lumbar region: Secondary | ICD-10-CM | POA: Diagnosis not present

## 2015-10-02 DIAGNOSIS — M810 Age-related osteoporosis without current pathological fracture: Secondary | ICD-10-CM | POA: Diagnosis not present

## 2015-10-02 DIAGNOSIS — E1140 Type 2 diabetes mellitus with diabetic neuropathy, unspecified: Secondary | ICD-10-CM | POA: Diagnosis not present

## 2015-10-02 DIAGNOSIS — M353 Polymyalgia rheumatica: Secondary | ICD-10-CM | POA: Diagnosis not present

## 2015-10-06 DIAGNOSIS — M5136 Other intervertebral disc degeneration, lumbar region: Secondary | ICD-10-CM | POA: Diagnosis not present

## 2015-10-06 DIAGNOSIS — M353 Polymyalgia rheumatica: Secondary | ICD-10-CM | POA: Diagnosis not present

## 2015-10-06 DIAGNOSIS — M1990 Unspecified osteoarthritis, unspecified site: Secondary | ICD-10-CM | POA: Diagnosis not present

## 2015-10-06 DIAGNOSIS — I129 Hypertensive chronic kidney disease with stage 1 through stage 4 chronic kidney disease, or unspecified chronic kidney disease: Secondary | ICD-10-CM | POA: Diagnosis not present

## 2015-10-06 DIAGNOSIS — M25552 Pain in left hip: Secondary | ICD-10-CM | POA: Diagnosis not present

## 2015-10-06 DIAGNOSIS — E1140 Type 2 diabetes mellitus with diabetic neuropathy, unspecified: Secondary | ICD-10-CM | POA: Diagnosis not present

## 2015-10-08 DIAGNOSIS — M1990 Unspecified osteoarthritis, unspecified site: Secondary | ICD-10-CM | POA: Diagnosis not present

## 2015-10-08 DIAGNOSIS — M25552 Pain in left hip: Secondary | ICD-10-CM | POA: Diagnosis not present

## 2015-10-08 DIAGNOSIS — I129 Hypertensive chronic kidney disease with stage 1 through stage 4 chronic kidney disease, or unspecified chronic kidney disease: Secondary | ICD-10-CM | POA: Diagnosis not present

## 2015-10-08 DIAGNOSIS — E1140 Type 2 diabetes mellitus with diabetic neuropathy, unspecified: Secondary | ICD-10-CM | POA: Diagnosis not present

## 2015-10-08 DIAGNOSIS — M353 Polymyalgia rheumatica: Secondary | ICD-10-CM | POA: Diagnosis not present

## 2015-10-08 DIAGNOSIS — M5136 Other intervertebral disc degeneration, lumbar region: Secondary | ICD-10-CM | POA: Diagnosis not present

## 2015-10-09 DIAGNOSIS — F3181 Bipolar II disorder: Secondary | ICD-10-CM | POA: Diagnosis not present

## 2015-10-10 DIAGNOSIS — M25552 Pain in left hip: Secondary | ICD-10-CM | POA: Diagnosis not present

## 2015-10-10 DIAGNOSIS — M353 Polymyalgia rheumatica: Secondary | ICD-10-CM | POA: Diagnosis not present

## 2015-10-10 DIAGNOSIS — M5136 Other intervertebral disc degeneration, lumbar region: Secondary | ICD-10-CM | POA: Diagnosis not present

## 2015-10-10 DIAGNOSIS — E1140 Type 2 diabetes mellitus with diabetic neuropathy, unspecified: Secondary | ICD-10-CM | POA: Diagnosis not present

## 2015-10-10 DIAGNOSIS — M1990 Unspecified osteoarthritis, unspecified site: Secondary | ICD-10-CM | POA: Diagnosis not present

## 2015-10-10 DIAGNOSIS — I129 Hypertensive chronic kidney disease with stage 1 through stage 4 chronic kidney disease, or unspecified chronic kidney disease: Secondary | ICD-10-CM | POA: Diagnosis not present

## 2015-10-13 DIAGNOSIS — E1140 Type 2 diabetes mellitus with diabetic neuropathy, unspecified: Secondary | ICD-10-CM | POA: Diagnosis not present

## 2015-10-13 DIAGNOSIS — M25552 Pain in left hip: Secondary | ICD-10-CM | POA: Diagnosis not present

## 2015-10-13 DIAGNOSIS — I129 Hypertensive chronic kidney disease with stage 1 through stage 4 chronic kidney disease, or unspecified chronic kidney disease: Secondary | ICD-10-CM | POA: Diagnosis not present

## 2015-10-13 DIAGNOSIS — M1990 Unspecified osteoarthritis, unspecified site: Secondary | ICD-10-CM | POA: Diagnosis not present

## 2015-10-13 DIAGNOSIS — M5136 Other intervertebral disc degeneration, lumbar region: Secondary | ICD-10-CM | POA: Diagnosis not present

## 2015-10-13 DIAGNOSIS — M353 Polymyalgia rheumatica: Secondary | ICD-10-CM | POA: Diagnosis not present

## 2015-10-14 DIAGNOSIS — M1990 Unspecified osteoarthritis, unspecified site: Secondary | ICD-10-CM | POA: Diagnosis not present

## 2015-10-14 DIAGNOSIS — M353 Polymyalgia rheumatica: Secondary | ICD-10-CM | POA: Diagnosis not present

## 2015-10-14 DIAGNOSIS — M25552 Pain in left hip: Secondary | ICD-10-CM | POA: Diagnosis not present

## 2015-10-14 DIAGNOSIS — E1140 Type 2 diabetes mellitus with diabetic neuropathy, unspecified: Secondary | ICD-10-CM | POA: Diagnosis not present

## 2015-10-14 DIAGNOSIS — M5136 Other intervertebral disc degeneration, lumbar region: Secondary | ICD-10-CM | POA: Diagnosis not present

## 2015-10-14 DIAGNOSIS — I129 Hypertensive chronic kidney disease with stage 1 through stage 4 chronic kidney disease, or unspecified chronic kidney disease: Secondary | ICD-10-CM | POA: Diagnosis not present

## 2015-10-15 DIAGNOSIS — I129 Hypertensive chronic kidney disease with stage 1 through stage 4 chronic kidney disease, or unspecified chronic kidney disease: Secondary | ICD-10-CM | POA: Diagnosis not present

## 2015-10-15 DIAGNOSIS — M353 Polymyalgia rheumatica: Secondary | ICD-10-CM | POA: Diagnosis not present

## 2015-10-15 DIAGNOSIS — M1990 Unspecified osteoarthritis, unspecified site: Secondary | ICD-10-CM | POA: Diagnosis not present

## 2015-10-15 DIAGNOSIS — M25552 Pain in left hip: Secondary | ICD-10-CM | POA: Diagnosis not present

## 2015-10-15 DIAGNOSIS — E1140 Type 2 diabetes mellitus with diabetic neuropathy, unspecified: Secondary | ICD-10-CM | POA: Diagnosis not present

## 2015-10-15 DIAGNOSIS — M5136 Other intervertebral disc degeneration, lumbar region: Secondary | ICD-10-CM | POA: Diagnosis not present

## 2015-10-16 DIAGNOSIS — I129 Hypertensive chronic kidney disease with stage 1 through stage 4 chronic kidney disease, or unspecified chronic kidney disease: Secondary | ICD-10-CM | POA: Diagnosis not present

## 2015-10-16 DIAGNOSIS — M1990 Unspecified osteoarthritis, unspecified site: Secondary | ICD-10-CM | POA: Diagnosis not present

## 2015-10-16 DIAGNOSIS — M25552 Pain in left hip: Secondary | ICD-10-CM | POA: Diagnosis not present

## 2015-10-16 DIAGNOSIS — M5136 Other intervertebral disc degeneration, lumbar region: Secondary | ICD-10-CM | POA: Diagnosis not present

## 2015-10-16 DIAGNOSIS — E1140 Type 2 diabetes mellitus with diabetic neuropathy, unspecified: Secondary | ICD-10-CM | POA: Diagnosis not present

## 2015-10-16 DIAGNOSIS — M353 Polymyalgia rheumatica: Secondary | ICD-10-CM | POA: Diagnosis not present

## 2015-10-20 DIAGNOSIS — I129 Hypertensive chronic kidney disease with stage 1 through stage 4 chronic kidney disease, or unspecified chronic kidney disease: Secondary | ICD-10-CM | POA: Diagnosis not present

## 2015-10-20 DIAGNOSIS — M25552 Pain in left hip: Secondary | ICD-10-CM | POA: Diagnosis not present

## 2015-10-20 DIAGNOSIS — M1990 Unspecified osteoarthritis, unspecified site: Secondary | ICD-10-CM | POA: Diagnosis not present

## 2015-10-20 DIAGNOSIS — E1140 Type 2 diabetes mellitus with diabetic neuropathy, unspecified: Secondary | ICD-10-CM | POA: Diagnosis not present

## 2015-10-20 DIAGNOSIS — M5136 Other intervertebral disc degeneration, lumbar region: Secondary | ICD-10-CM | POA: Diagnosis not present

## 2015-10-20 DIAGNOSIS — M353 Polymyalgia rheumatica: Secondary | ICD-10-CM | POA: Diagnosis not present

## 2015-10-21 DIAGNOSIS — M5136 Other intervertebral disc degeneration, lumbar region: Secondary | ICD-10-CM | POA: Diagnosis not present

## 2015-10-21 DIAGNOSIS — M1990 Unspecified osteoarthritis, unspecified site: Secondary | ICD-10-CM | POA: Diagnosis not present

## 2015-10-21 DIAGNOSIS — E1140 Type 2 diabetes mellitus with diabetic neuropathy, unspecified: Secondary | ICD-10-CM | POA: Diagnosis not present

## 2015-10-21 DIAGNOSIS — I129 Hypertensive chronic kidney disease with stage 1 through stage 4 chronic kidney disease, or unspecified chronic kidney disease: Secondary | ICD-10-CM | POA: Diagnosis not present

## 2015-10-21 DIAGNOSIS — M353 Polymyalgia rheumatica: Secondary | ICD-10-CM | POA: Diagnosis not present

## 2015-10-21 DIAGNOSIS — M25552 Pain in left hip: Secondary | ICD-10-CM | POA: Diagnosis not present

## 2015-10-22 DIAGNOSIS — M5136 Other intervertebral disc degeneration, lumbar region: Secondary | ICD-10-CM | POA: Diagnosis not present

## 2015-10-22 DIAGNOSIS — I129 Hypertensive chronic kidney disease with stage 1 through stage 4 chronic kidney disease, or unspecified chronic kidney disease: Secondary | ICD-10-CM | POA: Diagnosis not present

## 2015-10-22 DIAGNOSIS — M25552 Pain in left hip: Secondary | ICD-10-CM | POA: Diagnosis not present

## 2015-10-22 DIAGNOSIS — E1140 Type 2 diabetes mellitus with diabetic neuropathy, unspecified: Secondary | ICD-10-CM | POA: Diagnosis not present

## 2015-10-22 DIAGNOSIS — M1990 Unspecified osteoarthritis, unspecified site: Secondary | ICD-10-CM | POA: Diagnosis not present

## 2015-10-22 DIAGNOSIS — M353 Polymyalgia rheumatica: Secondary | ICD-10-CM | POA: Diagnosis not present

## 2015-10-23 DIAGNOSIS — M1990 Unspecified osteoarthritis, unspecified site: Secondary | ICD-10-CM | POA: Diagnosis not present

## 2015-10-23 DIAGNOSIS — M353 Polymyalgia rheumatica: Secondary | ICD-10-CM | POA: Diagnosis not present

## 2015-10-23 DIAGNOSIS — M25552 Pain in left hip: Secondary | ICD-10-CM | POA: Diagnosis not present

## 2015-10-23 DIAGNOSIS — E1140 Type 2 diabetes mellitus with diabetic neuropathy, unspecified: Secondary | ICD-10-CM | POA: Diagnosis not present

## 2015-10-23 DIAGNOSIS — I129 Hypertensive chronic kidney disease with stage 1 through stage 4 chronic kidney disease, or unspecified chronic kidney disease: Secondary | ICD-10-CM | POA: Diagnosis not present

## 2015-10-23 DIAGNOSIS — M5136 Other intervertebral disc degeneration, lumbar region: Secondary | ICD-10-CM | POA: Diagnosis not present

## 2015-10-27 ENCOUNTER — Other Ambulatory Visit: Payer: Self-pay | Admitting: Cardiology

## 2015-10-28 DIAGNOSIS — M5136 Other intervertebral disc degeneration, lumbar region: Secondary | ICD-10-CM | POA: Diagnosis not present

## 2015-10-28 DIAGNOSIS — E1140 Type 2 diabetes mellitus with diabetic neuropathy, unspecified: Secondary | ICD-10-CM | POA: Diagnosis not present

## 2015-10-28 DIAGNOSIS — M353 Polymyalgia rheumatica: Secondary | ICD-10-CM | POA: Diagnosis not present

## 2015-10-28 DIAGNOSIS — M25552 Pain in left hip: Secondary | ICD-10-CM | POA: Diagnosis not present

## 2015-10-28 DIAGNOSIS — I129 Hypertensive chronic kidney disease with stage 1 through stage 4 chronic kidney disease, or unspecified chronic kidney disease: Secondary | ICD-10-CM | POA: Diagnosis not present

## 2015-10-28 DIAGNOSIS — M1990 Unspecified osteoarthritis, unspecified site: Secondary | ICD-10-CM | POA: Diagnosis not present

## 2015-10-29 DIAGNOSIS — M25552 Pain in left hip: Secondary | ICD-10-CM | POA: Diagnosis not present

## 2015-10-29 DIAGNOSIS — M1990 Unspecified osteoarthritis, unspecified site: Secondary | ICD-10-CM | POA: Diagnosis not present

## 2015-10-29 DIAGNOSIS — E1140 Type 2 diabetes mellitus with diabetic neuropathy, unspecified: Secondary | ICD-10-CM | POA: Diagnosis not present

## 2015-10-29 DIAGNOSIS — M353 Polymyalgia rheumatica: Secondary | ICD-10-CM | POA: Diagnosis not present

## 2015-10-29 DIAGNOSIS — M5136 Other intervertebral disc degeneration, lumbar region: Secondary | ICD-10-CM | POA: Diagnosis not present

## 2015-10-29 DIAGNOSIS — I129 Hypertensive chronic kidney disease with stage 1 through stage 4 chronic kidney disease, or unspecified chronic kidney disease: Secondary | ICD-10-CM | POA: Diagnosis not present

## 2015-10-30 DIAGNOSIS — M1990 Unspecified osteoarthritis, unspecified site: Secondary | ICD-10-CM | POA: Diagnosis not present

## 2015-10-30 DIAGNOSIS — M5136 Other intervertebral disc degeneration, lumbar region: Secondary | ICD-10-CM | POA: Diagnosis not present

## 2015-10-30 DIAGNOSIS — M353 Polymyalgia rheumatica: Secondary | ICD-10-CM | POA: Diagnosis not present

## 2015-10-30 DIAGNOSIS — I129 Hypertensive chronic kidney disease with stage 1 through stage 4 chronic kidney disease, or unspecified chronic kidney disease: Secondary | ICD-10-CM | POA: Diagnosis not present

## 2015-10-30 DIAGNOSIS — M25552 Pain in left hip: Secondary | ICD-10-CM | POA: Diagnosis not present

## 2015-10-30 DIAGNOSIS — E1140 Type 2 diabetes mellitus with diabetic neuropathy, unspecified: Secondary | ICD-10-CM | POA: Diagnosis not present

## 2015-10-30 DIAGNOSIS — M792 Neuralgia and neuritis, unspecified: Secondary | ICD-10-CM | POA: Diagnosis not present

## 2015-10-30 DIAGNOSIS — M722 Plantar fascial fibromatosis: Secondary | ICD-10-CM | POA: Diagnosis not present

## 2015-10-31 DIAGNOSIS — E1140 Type 2 diabetes mellitus with diabetic neuropathy, unspecified: Secondary | ICD-10-CM | POA: Diagnosis not present

## 2015-10-31 DIAGNOSIS — M1990 Unspecified osteoarthritis, unspecified site: Secondary | ICD-10-CM | POA: Diagnosis not present

## 2015-10-31 DIAGNOSIS — M353 Polymyalgia rheumatica: Secondary | ICD-10-CM | POA: Diagnosis not present

## 2015-10-31 DIAGNOSIS — M25552 Pain in left hip: Secondary | ICD-10-CM | POA: Diagnosis not present

## 2015-10-31 DIAGNOSIS — I129 Hypertensive chronic kidney disease with stage 1 through stage 4 chronic kidney disease, or unspecified chronic kidney disease: Secondary | ICD-10-CM | POA: Diagnosis not present

## 2015-10-31 DIAGNOSIS — M5136 Other intervertebral disc degeneration, lumbar region: Secondary | ICD-10-CM | POA: Diagnosis not present

## 2015-11-05 DIAGNOSIS — M353 Polymyalgia rheumatica: Secondary | ICD-10-CM | POA: Diagnosis not present

## 2015-11-05 DIAGNOSIS — E1140 Type 2 diabetes mellitus with diabetic neuropathy, unspecified: Secondary | ICD-10-CM | POA: Diagnosis not present

## 2015-11-05 DIAGNOSIS — M25552 Pain in left hip: Secondary | ICD-10-CM | POA: Diagnosis not present

## 2015-11-05 DIAGNOSIS — M5136 Other intervertebral disc degeneration, lumbar region: Secondary | ICD-10-CM | POA: Diagnosis not present

## 2015-11-05 DIAGNOSIS — M1990 Unspecified osteoarthritis, unspecified site: Secondary | ICD-10-CM | POA: Diagnosis not present

## 2015-11-05 DIAGNOSIS — I129 Hypertensive chronic kidney disease with stage 1 through stage 4 chronic kidney disease, or unspecified chronic kidney disease: Secondary | ICD-10-CM | POA: Diagnosis not present

## 2015-11-06 DIAGNOSIS — M5136 Other intervertebral disc degeneration, lumbar region: Secondary | ICD-10-CM | POA: Diagnosis not present

## 2015-11-06 DIAGNOSIS — M353 Polymyalgia rheumatica: Secondary | ICD-10-CM | POA: Diagnosis not present

## 2015-11-06 DIAGNOSIS — M1990 Unspecified osteoarthritis, unspecified site: Secondary | ICD-10-CM | POA: Diagnosis not present

## 2015-11-06 DIAGNOSIS — E1140 Type 2 diabetes mellitus with diabetic neuropathy, unspecified: Secondary | ICD-10-CM | POA: Diagnosis not present

## 2015-11-06 DIAGNOSIS — I129 Hypertensive chronic kidney disease with stage 1 through stage 4 chronic kidney disease, or unspecified chronic kidney disease: Secondary | ICD-10-CM | POA: Diagnosis not present

## 2015-11-06 DIAGNOSIS — M25552 Pain in left hip: Secondary | ICD-10-CM | POA: Diagnosis not present

## 2015-11-07 DIAGNOSIS — M5136 Other intervertebral disc degeneration, lumbar region: Secondary | ICD-10-CM | POA: Diagnosis not present

## 2015-11-07 DIAGNOSIS — M25552 Pain in left hip: Secondary | ICD-10-CM | POA: Diagnosis not present

## 2015-11-07 DIAGNOSIS — E1140 Type 2 diabetes mellitus with diabetic neuropathy, unspecified: Secondary | ICD-10-CM | POA: Diagnosis not present

## 2015-11-07 DIAGNOSIS — M353 Polymyalgia rheumatica: Secondary | ICD-10-CM | POA: Diagnosis not present

## 2015-11-07 DIAGNOSIS — M1990 Unspecified osteoarthritis, unspecified site: Secondary | ICD-10-CM | POA: Diagnosis not present

## 2015-11-07 DIAGNOSIS — I129 Hypertensive chronic kidney disease with stage 1 through stage 4 chronic kidney disease, or unspecified chronic kidney disease: Secondary | ICD-10-CM | POA: Diagnosis not present

## 2015-11-10 DIAGNOSIS — E1140 Type 2 diabetes mellitus with diabetic neuropathy, unspecified: Secondary | ICD-10-CM | POA: Diagnosis not present

## 2015-11-10 DIAGNOSIS — M5136 Other intervertebral disc degeneration, lumbar region: Secondary | ICD-10-CM | POA: Diagnosis not present

## 2015-11-10 DIAGNOSIS — M1990 Unspecified osteoarthritis, unspecified site: Secondary | ICD-10-CM | POA: Diagnosis not present

## 2015-11-10 DIAGNOSIS — I129 Hypertensive chronic kidney disease with stage 1 through stage 4 chronic kidney disease, or unspecified chronic kidney disease: Secondary | ICD-10-CM | POA: Diagnosis not present

## 2015-11-10 DIAGNOSIS — M25552 Pain in left hip: Secondary | ICD-10-CM | POA: Diagnosis not present

## 2015-11-10 DIAGNOSIS — M353 Polymyalgia rheumatica: Secondary | ICD-10-CM | POA: Diagnosis not present

## 2015-11-11 DIAGNOSIS — M1990 Unspecified osteoarthritis, unspecified site: Secondary | ICD-10-CM | POA: Diagnosis not present

## 2015-11-11 DIAGNOSIS — I129 Hypertensive chronic kidney disease with stage 1 through stage 4 chronic kidney disease, or unspecified chronic kidney disease: Secondary | ICD-10-CM | POA: Diagnosis not present

## 2015-11-11 DIAGNOSIS — M25552 Pain in left hip: Secondary | ICD-10-CM | POA: Diagnosis not present

## 2015-11-11 DIAGNOSIS — M5136 Other intervertebral disc degeneration, lumbar region: Secondary | ICD-10-CM | POA: Diagnosis not present

## 2015-11-11 DIAGNOSIS — E1140 Type 2 diabetes mellitus with diabetic neuropathy, unspecified: Secondary | ICD-10-CM | POA: Diagnosis not present

## 2015-11-11 DIAGNOSIS — M353 Polymyalgia rheumatica: Secondary | ICD-10-CM | POA: Diagnosis not present

## 2015-11-12 DIAGNOSIS — M5136 Other intervertebral disc degeneration, lumbar region: Secondary | ICD-10-CM | POA: Diagnosis not present

## 2015-11-12 DIAGNOSIS — M25552 Pain in left hip: Secondary | ICD-10-CM | POA: Diagnosis not present

## 2015-11-12 DIAGNOSIS — M1990 Unspecified osteoarthritis, unspecified site: Secondary | ICD-10-CM | POA: Diagnosis not present

## 2015-11-12 DIAGNOSIS — I129 Hypertensive chronic kidney disease with stage 1 through stage 4 chronic kidney disease, or unspecified chronic kidney disease: Secondary | ICD-10-CM | POA: Diagnosis not present

## 2015-11-12 DIAGNOSIS — M353 Polymyalgia rheumatica: Secondary | ICD-10-CM | POA: Diagnosis not present

## 2015-11-12 DIAGNOSIS — E1140 Type 2 diabetes mellitus with diabetic neuropathy, unspecified: Secondary | ICD-10-CM | POA: Diagnosis not present

## 2015-11-13 DIAGNOSIS — M1990 Unspecified osteoarthritis, unspecified site: Secondary | ICD-10-CM | POA: Diagnosis not present

## 2015-11-13 DIAGNOSIS — M5136 Other intervertebral disc degeneration, lumbar region: Secondary | ICD-10-CM | POA: Diagnosis not present

## 2015-11-13 DIAGNOSIS — E1140 Type 2 diabetes mellitus with diabetic neuropathy, unspecified: Secondary | ICD-10-CM | POA: Diagnosis not present

## 2015-11-13 DIAGNOSIS — M353 Polymyalgia rheumatica: Secondary | ICD-10-CM | POA: Diagnosis not present

## 2015-11-13 DIAGNOSIS — I129 Hypertensive chronic kidney disease with stage 1 through stage 4 chronic kidney disease, or unspecified chronic kidney disease: Secondary | ICD-10-CM | POA: Diagnosis not present

## 2015-11-13 DIAGNOSIS — M25552 Pain in left hip: Secondary | ICD-10-CM | POA: Diagnosis not present

## 2015-11-17 DIAGNOSIS — E1140 Type 2 diabetes mellitus with diabetic neuropathy, unspecified: Secondary | ICD-10-CM | POA: Diagnosis not present

## 2015-11-17 DIAGNOSIS — M25552 Pain in left hip: Secondary | ICD-10-CM | POA: Diagnosis not present

## 2015-11-17 DIAGNOSIS — M353 Polymyalgia rheumatica: Secondary | ICD-10-CM | POA: Diagnosis not present

## 2015-11-17 DIAGNOSIS — M5136 Other intervertebral disc degeneration, lumbar region: Secondary | ICD-10-CM | POA: Diagnosis not present

## 2015-11-17 DIAGNOSIS — M1990 Unspecified osteoarthritis, unspecified site: Secondary | ICD-10-CM | POA: Diagnosis not present

## 2015-11-17 DIAGNOSIS — I129 Hypertensive chronic kidney disease with stage 1 through stage 4 chronic kidney disease, or unspecified chronic kidney disease: Secondary | ICD-10-CM | POA: Diagnosis not present

## 2015-11-19 DIAGNOSIS — M5136 Other intervertebral disc degeneration, lumbar region: Secondary | ICD-10-CM | POA: Diagnosis not present

## 2015-11-19 DIAGNOSIS — M1990 Unspecified osteoarthritis, unspecified site: Secondary | ICD-10-CM | POA: Diagnosis not present

## 2015-11-19 DIAGNOSIS — E1140 Type 2 diabetes mellitus with diabetic neuropathy, unspecified: Secondary | ICD-10-CM | POA: Diagnosis not present

## 2015-11-19 DIAGNOSIS — M25552 Pain in left hip: Secondary | ICD-10-CM | POA: Diagnosis not present

## 2015-11-19 DIAGNOSIS — I129 Hypertensive chronic kidney disease with stage 1 through stage 4 chronic kidney disease, or unspecified chronic kidney disease: Secondary | ICD-10-CM | POA: Diagnosis not present

## 2015-11-19 DIAGNOSIS — M353 Polymyalgia rheumatica: Secondary | ICD-10-CM | POA: Diagnosis not present

## 2016-02-05 DIAGNOSIS — E1140 Type 2 diabetes mellitus with diabetic neuropathy, unspecified: Secondary | ICD-10-CM | POA: Diagnosis not present

## 2016-02-05 DIAGNOSIS — I10 Essential (primary) hypertension: Secondary | ICD-10-CM | POA: Diagnosis not present

## 2016-02-05 DIAGNOSIS — E038 Other specified hypothyroidism: Secondary | ICD-10-CM | POA: Diagnosis not present

## 2016-02-05 DIAGNOSIS — E559 Vitamin D deficiency, unspecified: Secondary | ICD-10-CM | POA: Diagnosis not present

## 2016-02-05 DIAGNOSIS — N390 Urinary tract infection, site not specified: Secondary | ICD-10-CM | POA: Diagnosis not present

## 2016-02-05 DIAGNOSIS — E538 Deficiency of other specified B group vitamins: Secondary | ICD-10-CM | POA: Diagnosis not present

## 2016-02-05 DIAGNOSIS — E784 Other hyperlipidemia: Secondary | ICD-10-CM | POA: Diagnosis not present

## 2016-02-05 DIAGNOSIS — R8299 Other abnormal findings in urine: Secondary | ICD-10-CM | POA: Diagnosis not present

## 2016-02-06 DIAGNOSIS — R413 Other amnesia: Secondary | ICD-10-CM | POA: Diagnosis not present

## 2016-02-06 DIAGNOSIS — F3341 Major depressive disorder, recurrent, in partial remission: Secondary | ICD-10-CM | POA: Diagnosis not present

## 2016-02-06 DIAGNOSIS — Z23 Encounter for immunization: Secondary | ICD-10-CM | POA: Diagnosis not present

## 2016-02-06 DIAGNOSIS — R627 Adult failure to thrive: Secondary | ICD-10-CM | POA: Diagnosis not present

## 2016-02-06 DIAGNOSIS — D692 Other nonthrombocytopenic purpura: Secondary | ICD-10-CM | POA: Diagnosis not present

## 2016-02-06 DIAGNOSIS — I129 Hypertensive chronic kidney disease with stage 1 through stage 4 chronic kidney disease, or unspecified chronic kidney disease: Secondary | ICD-10-CM | POA: Diagnosis not present

## 2016-02-06 DIAGNOSIS — N183 Chronic kidney disease, stage 3 (moderate): Secondary | ICD-10-CM | POA: Diagnosis not present

## 2016-02-06 DIAGNOSIS — Z1389 Encounter for screening for other disorder: Secondary | ICD-10-CM | POA: Diagnosis not present

## 2016-02-06 DIAGNOSIS — Z0000 Encounter for general adult medical examination without abnormal findings: Secondary | ICD-10-CM | POA: Diagnosis not present

## 2016-02-06 DIAGNOSIS — E1140 Type 2 diabetes mellitus with diabetic neuropathy, unspecified: Secondary | ICD-10-CM | POA: Diagnosis not present

## 2016-02-06 DIAGNOSIS — Z6834 Body mass index (BMI) 34.0-34.9, adult: Secondary | ICD-10-CM | POA: Diagnosis not present

## 2016-02-24 ENCOUNTER — Other Ambulatory Visit: Payer: Self-pay | Admitting: Cardiology

## 2016-02-24 NOTE — Telephone Encounter (Signed)
Rx request sent to pharmacy.  

## 2016-03-29 ENCOUNTER — Other Ambulatory Visit: Payer: Self-pay | Admitting: Cardiology

## 2016-04-16 ENCOUNTER — Ambulatory Visit: Payer: Medicare Other | Admitting: Student

## 2016-04-16 NOTE — Progress Notes (Deleted)
Cardiology Office Note    Date:  04/16/2016   ID:  Redonna, Schatz 1934-09-12, MRN [id number]  PCP:  Precious Reel, MD  Cardiologist: Dr. Martinique  No chief complaint on file.   History of Present Illness:    [name] is a 82 y.o. female with past medical history of HTN, HLD, Type 2 DM, known bifascicular BBB, and prior CVA (in 2014) who presents to the office today for routine follow-up.   Was last seen by Dr. Martinique in 11/2013. Denied any recent chest discomfort, dyspnea with exertion, orthopnea, or palpitations at that time. Had been switched from ASA to Plavix in the setting of her CVA in 2014. Echo at that time showed an EF of 60-65%, Grade 1 DD, no WMA, and mild chordal systolic anterior motion of the mitral valve without valvular systolic anterior motion.     Past Medical History:  Diagnosis Date  . Abnormality of gait   . Arthritis   . Bifascicular bundle branch block    chronic  . Degenerative spinal arthritis    cervical and lumbar  . Dementia    early onset  . Dyspnea on exertion   . GERD (gastroesophageal reflux disease)   . History of CVA (cerebrovascular accident)    03-31-2012--  right cerebellar infart without hemorrhgia  . History of gastric ulcer    2011  w/ bleed  . History of kidney stones   . History of squamous cell carcinoma excision   . Hyperlipidemia   . Hypertension   . Hypothyroidism   . Obstructive sleep apnea    has not been using -- but states has appointment soon to get refitted   . Polymyalgia rheumatica ([place])   . Stroke ([place])   . Type 2 diabetes mellitus ([place])   . Urge urinary incontinence    refactory    Past Surgical History:  Procedure Laterality Date  . APPENDECTOMY  as child  . HERNIA REPAIR  1956  . INTERSTIM IMPLANT PLACEMENT N/A 11/19/2014   Procedure: Barrie Lyme IMPLANT FIRST STAGE AND ;  Surgeon: Bjorn Loser, MD;  Location: [facility];  Service: Urology;  Laterality: N/A;  . INTERSTIM  IMPLANT PLACEMENT N/A 11/19/2014   Procedure: Barrie Lyme IMPLANT SECOND STAGE;  Surgeon: Bjorn Loser, MD;  Location: [facility];  Service: Urology;  Laterality: N/A;  . ORIF RIGHT WRIST FX  03-04-2004  . SHOULDER ARTHROSCOPY / DEBRIDEMENT/  SAD/  DCR/  MINI OPEN ROTATOR CUFF REPAIR Right 01-09-2003  . TEE WITHOUT CARDIOVERSION N/A 09/12/2012   Procedure: TRANSESOPHAGEAL ECHOCARDIOGRAM (TEE);  Surgeon: Lelon Perla, MD;  Location: [facility] ENDOSCOPY;  Service: Cardiovascular;  Laterality: N/A;  proximal septal thickening and chordal SAM creatin a narrow LVOT; turbulence noted  . TONSILLECTOMY  as child  . TOTAL KNEE ARTHROPLASTY Left 11-12-2004  . TRANSTHORACIC ECHOCARDIOGRAM  09-10-2012   mild LVH, mild  focal basal hypertrophy of the septum,  ef 123456, grade I diastolic dysfunction/  trival AR /  MV with moderate systolic anterior motion of the chordal structures/  mild LAE    Current Medications: Outpatient Medications Prior to Visit  Medication Sig Dispense Refill  . acetaminophen (TYLENOL) 325 MG tablet Take 650 mg by mouth every 6 (six) hours as needed.    Marland Kitchen atorvastatin (LIPITOR) 40 MG tablet TAKE ONE TABLET BY MOUTH DAILY. PLEASE SCHEDULE APPOINTMENT FOR REFILLS. 15 tablet 0  . benazepril (LOTENSIN) 40 MG tablet Take 40 mg by mouth daily.    Marland Kitchen  cephALEXin (KEFLEX) 500 MG capsule Take 1 capsule (500 mg total) by mouth 3 (three) times daily. 15 capsule 0  . Cholecalciferol 5000 UNITS capsule Take 1 capsule (5,000 Units total) by mouth daily. (Patient taking differently: Take 5,000 Units by mouth every morning. ) 30 capsule 1  . clopidogrel (PLAVIX) 75 MG tablet Take 1 tablet (75 mg total) by mouth daily with breakfast. 30 tablet 0  . donepezil (ARICEPT) 5 MG tablet Take 1 tablet (5 mg total) by mouth at bedtime. 30 tablet 1  . DULoxetine (CYMBALTA) 60 MG capsule Take 60 mg by mouth every morning.    . Fluticasone Furoate-Vilanterol (BREO ELLIPTA) 100-25 MCG/INH AEPB Inhale  into the lungs every evening.     . folic acid (FOLVITE) 1 MG tablet Take 1 tablet (1 mg total) by mouth 2 (two) times daily. 60 tablet 1  . glimepiride (AMARYL) 1 MG tablet Take 1 mg by mouth daily with breakfast.    . HYDROcodone-acetaminophen (NORCO/VICODIN) 5-325 MG tablet Take 1-2 tablets by mouth every 4 (four) hours as needed. 15 tablet 0  . lamoTRIgine (LAMICTAL) 150 MG tablet Take 150 mg by mouth every morning.    Marland Kitchen levothyroxine (SYNTHROID, LEVOTHROID) 100 MCG tablet Take 1 tablet (100 mcg total) by mouth daily. 30 tablet 1  . Lurasidone HCl (LATUDA) 20 MG TABS Take 1 tablet by mouth every evening.    . vitamin B-12 (CYANOCOBALAMIN) 1000 MCG tablet Take 1 tablet (1,000 mcg total) by mouth daily. (Patient taking differently: Take 1,000 mcg by mouth every morning. ) 30 tablet 1   No facility-administered medications prior to visit.      Allergies:   Adhesive [tape]   Social History   Social History  . Marital status: Widowed    Spouse name: N/A  . Number of children: N/A  . Years of education: N/A   Social History Main Topics  . Smoking status: Former Smoker    Quit date: 11/02/2004  . Smokeless tobacco: Never Used  . Alcohol use No  . Drug use: No  . Sexual activity: Not on file   Other Topics Concern  . Not on file   Social History Narrative  . No narrative on file     Family History:  The patient's ***family history includes Heart failure in her mother.   Review of Systems:   Please see the history of present illness.     General:  No chills, fever, night sweats or weight changes.  Cardiovascular:  No chest pain, dyspnea on exertion, edema, orthopnea, palpitations, paroxysmal nocturnal dyspnea. Dermatological: No rash, lesions/masses Respiratory: No cough, dyspnea Urologic: No hematuria, dysuria Abdominal:   No nausea, vomiting, diarrhea, bright red blood per rectum, melena, or hematemesis Neurologic:  No visual changes, wkns, changes in mental status. All  other systems reviewed and are otherwise negative except as noted above.   Physical Exam:    VS:  There were no vitals taken for this visit.   General: Well developed, well nourished,female appearing in no acute distress. Head: Normocephalic, atraumatic, sclera non-icteric, no xanthomas, nares are without discharge.  Neck: No carotid bruits. JVD not elevated.  Lungs: Respirations regular and unlabored, without wheezes or rales.  Heart: ***Regular rate and rhythm. No S3 or S4.  No murmur, no rubs, or gallops appreciated. Abdomen: Soft, non-tender, non-distended with normoactive bowel sounds. No hepatomegaly. No rebound/guarding. No obvious abdominal masses. Msk:  Strength and tone appear normal for age. No joint deformities or effusions. Extremities: No clubbing  or cyanosis. No edema.  Distal pedal pulses are 2+ bilaterally. Neuro: Alert and oriented X 3. Moves all extremities spontaneously. No focal deficits noted. Psych:  Responds to questions appropriately with a normal affect. Skin: No rashes or lesions noted  Wt Readings from Last 3 Encounters:  11/29/14 203 lb (92.1 kg)  11/19/14 203 lb (92.1 kg)  11/22/13 213 lb (96.6 kg)        Studies/Labs Reviewed:   EKG:  EKG is*** ordered today.  The ekg ordered today demonstrates ***  Recent Labs: No results found for requested labs within last 8760 hours.   Lipid Panel    Component Value Date/Time   CHOL 122 09/09/2012 0711   TRIG 112 09/09/2012 0711   HDL 47 09/09/2012 0711   CHOLHDL 2.6 09/09/2012 0711   VLDL 22 09/09/2012 0711   LDLCALC 53 09/09/2012 0711    Additional studies/ records that were reviewed today include:   Echocardiogram: 08/2012 Study Conclusions  - Left ventricle: The cavity size was normal. Wall thickness was increased in a pattern of mild LVH. There was mild focal basal hypertrophy of the septum. No significant LV outflow tract gradient was measured. Systolic function was normal. The  estimated ejection fraction was in the range of 60% to 65%. Wall motion was normal; there were no regional wall motion abnormalities. Doppler parameters are consistent with abnormal left ventricular relaxation (grade 1 diastolic dysfunction). - Aortic valve: There was no stenosis. - Mitral valve: There was mild chordal systolic anterior motion. I do not think that there was valvular systolic anterior motion. Mildly calcified annulus. Trivial regurgitation. - Left atrium: The atrium was mildly dilated. - Right ventricle: The cavity size was normal. Systolic function was normal. - Pulmonary arteries: No complete TR doppler jet so unable to estimate PA systolic pressure. - Systemic veins: IVC measured 1.8 cm with normal respirophasic variation, suggesting RA pressure 6-10 mmHg. Impressions:  - Normal LV size with mild LVH and mild focal basal septal hypertrophy. EF 60-65%. There is turbulence across the LV outflow tract but no significant gradient was recorded. Chordal SAM was seen but not valvular SAM. Normal RV size and systolic function.  Assessment:    No diagnosis found.   Plan:   In order of problems listed above:  1. ***    Medication Adjustments/Labs and Tests Ordered: Current medicines are reviewed at length with the patient today.  Concerns regarding medicines are outlined above.  Medication changes, Labs and Tests ordered today are listed in the Patient Instructions below. There are no Patient Instructions on file for this visit.   Weston Brass Erma Heritage, [place]  04/16/2016 8:08 AM    [facility] [place], [place], [place]  [address] Phone: [phone]; Fax: [phone]  [address], [place], [place] [address] Phone: [phone]

## 2016-04-19 ENCOUNTER — Other Ambulatory Visit: Payer: Self-pay | Admitting: Cardiology

## 2016-04-19 MED ORDER — ATORVASTATIN CALCIUM 40 MG PO TABS: 40.0000 mg | ORAL_TABLET | Freq: Every day | ORAL | 0 refills | Status: DC

## 2016-04-19 NOTE — Telephone Encounter (Signed)
Rx(s) sent to pharmacy electronically.  

## 2016-04-19 NOTE — Telephone Encounter (Signed)
New Message     *STAT* If patient is at the pharmacy, call can be transferred to refill team.   1. Which medications need to be refilled? (please list name of each medication and dose if known)  atorvastatin (LIPITOR) 40 MG tablet TAKE ONE TABLET BY MOUTH DAILY. PLEASE SCHEDULE APPOINTMENT FOR REFILLS.     2. Which pharmacy/location (including street and city if local pharmacy) is medication to be sent to [address]  3. Do they need a 30 day or 90 day supply?  Needs 5 days worth called in until she comes in friday

## 2016-04-22 NOTE — Progress Notes (Signed)
Cardiology Office Note    Date:  04/23/2016   ID:  Makila, Maler [birth year], MRN [id number]  PCP:  Precious Reel, MD  Cardiologist: Dr. Martinique  Chief Complaint  Patient presents with  . Shortness of Breath    History of Present Illness:    [name] is a 82 y.o. female with past medical history of HTN, HLD, Type 2 DM, and prior CVA (in 2014) who presents to the office today for follow-up.   She was last seen by Dr. Martinique in 11/2013. Was doing well from a cardiac perspective at that time. BP at 130/90 during her office visit and she was continued on Benazepril 40mg  daily and Crestor 20mg  daily.   In talking with the patient today, she initially reports doing well from a cardiac perspective over the past 3 years. Her son who is present today presents a different story reporting that she has been experiencing worsening dyspnea with exertion. When asked what a typical day looks like for her, she reports sitting in her recliner watching television for a majority of the day, only leaving her chair to go to the restroom. Her son and daughter perform all the household chores and cooking. She denies any recent chest pain, palpitations, orthopnea, PND, or lower extremity edema.  There is confusion about her current medication regimen as Benzapril 40 mg daily along with Avapro 75 mg daily are both listed on her AVS. Her son does not believe she is taking the Avapro but is unsure of this.   Past Medical History:  Diagnosis Date  . Abnormality of gait   . Arthritis   . Bifascicular bundle branch block    chronic  . Degenerative spinal arthritis    cervical and lumbar  . Dementia    early onset  . Dyspnea on exertion   . GERD (gastroesophageal reflux disease)   . History of CVA (cerebrovascular accident)    03-31-2012--  right cerebellar infart without hemorrhgia  . History of gastric ulcer    2011  w/ bleed  . History of kidney stones   . History of squamous cell carcinoma  excision   . Hyperlipidemia   . Hypertension   . Hypothyroidism   . Obstructive sleep apnea    has not been using -- but states has appointment soon to get refitted   . Polymyalgia rheumatica ([place])   . Stroke ([place])   . Type 2 diabetes mellitus ([place])   . Urge urinary incontinence    refactory    Past Surgical History:  Procedure Laterality Date  . APPENDECTOMY  as child  . HERNIA REPAIR  1956  . INTERSTIM IMPLANT PLACEMENT N/A 11/19/2014   Procedure: Barrie Lyme IMPLANT FIRST STAGE AND ;  Surgeon: Bjorn Loser, MD;  Location: [facility];  Service: Urology;  Laterality: N/A;  . INTERSTIM IMPLANT PLACEMENT N/A 11/19/2014   Procedure: Barrie Lyme IMPLANT SECOND STAGE;  Surgeon: Bjorn Loser, MD;  Location: [facility];  Service: Urology;  Laterality: N/A;  . ORIF RIGHT WRIST FX  03-04-2004  . SHOULDER ARTHROSCOPY / DEBRIDEMENT/  SAD/  DCR/  MINI OPEN ROTATOR CUFF REPAIR Right 01-09-2003  . TEE WITHOUT CARDIOVERSION N/A 09/12/2012   Procedure: TRANSESOPHAGEAL ECHOCARDIOGRAM (TEE);  Surgeon: Lelon Perla, MD;  Location: [facility] ENDOSCOPY;  Service: Cardiovascular;  Laterality: N/A;  proximal septal thickening and chordal SAM creatin a narrow LVOT; turbulence noted  . TONSILLECTOMY  as child  . TOTAL KNEE ARTHROPLASTY Left 11-12-2004  .  TRANSTHORACIC ECHOCARDIOGRAM  09-10-2012   mild LVH, mild  focal basal hypertrophy of the septum,  ef 123456, grade I diastolic dysfunction/  trival AR /  MV with moderate systolic anterior motion of the chordal structures/  mild LAE    Current Medications: Outpatient Medications Prior to Visit  Medication Sig Dispense Refill  . acetaminophen (TYLENOL) 325 MG tablet Take 650 mg by mouth every 6 (six) hours as needed.    . benazepril (LOTENSIN) 40 MG tablet Take 40 mg by mouth daily.    . Cholecalciferol 5000 UNITS capsule Take 1 capsule (5,000 Units total) by mouth daily. (Patient taking differently: Take 5,000 Units by  mouth every morning. ) 30 capsule 1  . clopidogrel (PLAVIX) 75 MG tablet Take 1 tablet (75 mg total) by mouth daily with breakfast. 30 tablet 0  . donepezil (ARICEPT) 5 MG tablet Take 1 tablet (5 mg total) by mouth at bedtime. 30 tablet 1  . DULoxetine (CYMBALTA) 60 MG capsule Take 60 mg by mouth every morning.    . Fluticasone Furoate-Vilanterol (BREO ELLIPTA) 100-25 MCG/INH AEPB Inhale into the lungs every evening.     . folic acid (FOLVITE) 1 MG tablet Take 1 tablet (1 mg total) by mouth 2 (two) times daily. 60 tablet 1  . glimepiride (AMARYL) 1 MG tablet Take 1 mg by mouth daily with breakfast.    . lamoTRIgine (LAMICTAL) 150 MG tablet Take 150 mg by mouth every morning.    Marland Kitchen levothyroxine (SYNTHROID, LEVOTHROID) 100 MCG tablet Take 1 tablet (100 mcg total) by mouth daily. 30 tablet 1  . Lurasidone HCl (LATUDA) 20 MG TABS Take 1 tablet by mouth every evening.    . vitamin B-12 (CYANOCOBALAMIN) 1000 MCG tablet Take 1 tablet (1,000 mcg total) by mouth daily. (Patient taking differently: Take 1,000 mcg by mouth every morning. ) 30 tablet 1  . atorvastatin (LIPITOR) 40 MG tablet Take 1 tablet (40 mg total) by mouth daily. 30 tablet 0  . cephALEXin (KEFLEX) 500 MG capsule Take 1 capsule (500 mg total) by mouth 3 (three) times daily. 15 capsule 0  . HYDROcodone-acetaminophen (NORCO/VICODIN) 5-325 MG tablet Take 1-2 tablets by mouth every 4 (four) hours as needed. 15 tablet 0   No facility-administered medications prior to visit.      Allergies:   Adhesive [tape]   Social History   Social History  . Marital status: Widowed    Spouse name: N/A  . Number of children: N/A  . Years of education: N/A   Social History Main Topics  . Smoking status: Former Smoker    Quit date: 11/02/2004  . Smokeless tobacco: Never Used  . Alcohol use No  . Drug use: No  . Sexual activity: Not Asked   Other Topics Concern  . None   Social History Narrative  . None     Family History:  The patient's  family history includes Heart failure in her mother.   Review of Systems:   Please see the history of present illness.     General:  No chills, fever, night sweats or weight changes. Positive for fatigue.  Cardiovascular:  No chest pain, edema, orthopnea, palpitations, paroxysmal nocturnal dyspnea. Positive for dyspnea on exertion. Dermatological: No rash, lesions/masses Respiratory: No cough, dyspnea Urologic: No hematuria, dysuria Abdominal:   No nausea, vomiting, diarrhea, bright red blood per rectum, melena, or hematemesis Neurologic:  No visual changes, wkns, changes in mental status. All other systems reviewed and are otherwise negative except as  noted above.   Physical Exam:    VS:  BP 140/79   Pulse 72   Ht 5\' 4"  (1.626 m)   Wt 199 lb 9.6 oz (90.5 kg)   BMI 34.26 kg/m    General: Well developed, elderly Caucasian female appearing in no acute distress. Head: Normocephalic, atraumatic, sclera non-icteric, no xanthomas, nares are without discharge.  Neck: No carotid bruits. JVD not elevated.  Lungs: Respirations regular and unlabored, without wheezes or rales.  Heart: Regular rate and rhythm. No S3 or S4.  No murmur, no rubs, or gallops appreciated. Abdomen: Soft, non-tender, non-distended with normoactive bowel sounds. No hepatomegaly. No rebound/guarding. No obvious abdominal masses. Msk:  Strength and tone appear normal for age. No joint deformities or effusions. Extremities: No clubbing or cyanosis. Trace lower extremity edema.  Distal pedal pulses are 2+ bilaterally. Neuro: Alert and oriented X 3. Moves all extremities spontaneously. No focal deficits noted. Psych:  Responds to questions appropriately with a normal affect. Skin: No rashes or lesions noted  Wt Readings from Last 3 Encounters:  04/23/16 199 lb 9.6 oz (90.5 kg)  11/29/14 203 lb (92.1 kg)  11/19/14 203 lb (92.1 kg)     Studies/Labs Reviewed:   EKG:  EKG is ordered today. The ekg ordered today  demonstrates NSR, HR 72, 1st degree AV block and bifascicular block. Similar to prior tracings.   Recent Labs: No results found for requested labs within last 8760 hours.   Lipid Panel    Component Value Date/Time   CHOL 122 09/09/2012 0711   TRIG 112 09/09/2012 0711   HDL 47 09/09/2012 0711   CHOLHDL 2.6 09/09/2012 0711   VLDL 22 09/09/2012 0711   LDLCALC 53 09/09/2012 0711    Additional studies/ records that were reviewed today include:   Echocardiogram: 09/12/2012 Study Conclusions  - Left ventricle: There was moderate focal basal hypertrophy of the septum. Systolic function was normal. The estimated ejection fraction was in the range of 60% to 65%. Wall motion was normal; there were no regional wall motion abnormalities. - Aortic valve: Trivial regurgitation. - Mitral valve: There was moderate systolic anterior motion of the chordal structures. - Left atrium: The atrium was mildly dilated. No evidence of thrombus in the atrial cavity or appendage. - Atrial septum: No defect or patent foramen ovale was identified. Echo contrast study showed no right-to-left atrial level shunt, following an increase in RA pressure induced by provocative maneuvers. - Tricuspid valve: No evidence of vegetation. Impressions:  - There is proximal septal thickening and chordal SAM creating a narrow LVOT; turbulence in LVOT noted; suggest transthoracic echo to better assess gradient if clinically indicated.  Assessment:    1. DOE (dyspnea on exertion)   2. Systolic anterior movement of mitral valve   3. Essential hypertension   4. Hyperlipidemia, unspecified hyperlipidemia type      Plan:   In order of problems listed above:  1. Dyspnea on Exertion/ Chordal SAM of Mitral Valve - patient reports worsening dyspnea with exertion over the past 6+ months. Very sedentary at baseline. Denies any associated chest pain, palpitations, orthopnea, PND, or lower  extremity edema. - she does not appear volume overloaded by physical examination. - EKG today is without acute ischemic changes. Ambulatory oxygen saturations personally checked during today's visit and remained > 93% with HR in 80's - 90's.  - echo in 2014 showed proximal septal thickening and chordal SAM creating a narrow LVOT. Recommend obtaining a repeat echocardiogram to assess EF along  with LVOT in the setting of her worsening dyspnea.   2. Essential HTN - BP at 140/79 during today's visit.  - both Irbesartan and Benazepril are listed on her current medication regimen. Both the patient and her son were educated that she should not be on both an ACE-I and ARB. Unsure if she is taking Irbesartan. Will plan to stop this and continue with Benazepril if taking both. I encouraged her to check BP regularly at home as she may require further titration once we can verify her current medication regimen.   3. Hyperlipidemia - followed by PCP. - continue Lipitor 40mg  daily.    Medication Adjustments/Labs and Tests Ordered: Current medicines are reviewed at length with the patient today.  Concerns regarding medicines are outlined above.  Medication changes, Labs and Tests ordered today are listed in the Patient Instructions below. Patient Instructions  Testing/Procedures: Your physician has requested that you have an echocardiogram. Echocardiography is a painless test that uses sound waves to create images of your heart. It provides your doctor with information about the size and shape of your heart and how well your heart's chambers and valves are working. This procedure takes approximately one hour. There are no restrictions for this procedure.  Follow-Up: Your physician wants you to follow-up in: 12 MONTHS You will receive a reminder letter in the mail two months in advance. If you don't receive a letter, please call our office to schedule the follow-up appointment.   Signed, Erma Heritage,  PA  04/23/2016 8:22 PM    [place], [place], [place]  [address] Phone: [phone]; Fax: [phone]  [address] [place], [place] [address] Phone: [phone]

## 2016-04-23 ENCOUNTER — Ambulatory Visit (INDEPENDENT_AMBULATORY_CARE_PROVIDER_SITE_OTHER): Payer: Medicare Other | Admitting: Student

## 2016-04-23 ENCOUNTER — Encounter: Payer: Self-pay | Admitting: Student

## 2016-04-23 VITALS — BP 140/79 | HR 72 | Ht 64.0 in | Wt 199.6 lb

## 2016-04-23 DIAGNOSIS — I348 Other nonrheumatic mitral valve disorders: Secondary | ICD-10-CM

## 2016-04-23 DIAGNOSIS — R0600 Dyspnea, unspecified: Secondary | ICD-10-CM

## 2016-04-23 DIAGNOSIS — E785 Hyperlipidemia, unspecified: Secondary | ICD-10-CM | POA: Diagnosis not present

## 2016-04-23 DIAGNOSIS — I10 Essential (primary) hypertension: Secondary | ICD-10-CM | POA: Diagnosis not present

## 2016-04-23 DIAGNOSIS — R0609 Other forms of dyspnea: Secondary | ICD-10-CM | POA: Diagnosis not present

## 2016-04-23 DIAGNOSIS — I3489 Other nonrheumatic mitral valve disorders: Secondary | ICD-10-CM

## 2016-04-23 MED ORDER — ATORVASTATIN CALCIUM 40 MG PO TABS: 40.0000 mg | ORAL_TABLET | Freq: Every day | ORAL | 3 refills | Status: DC

## 2016-04-23 NOTE — Patient Instructions (Signed)
Testing/Procedures: Your physician has requested that you have an echocardiogram. Echocardiography is a painless test that uses sound waves to create images of your heart. It provides your doctor with information about the size and shape of your heart and how well your heart's chambers and valves are working. This procedure takes approximately one hour. There are no restrictions for this procedure.  Follow-Up: Your physician wants you to follow-up in: 12 MONTHS You will receive a reminder letter in the mail two months in advance. If you don't receive a letter, please call our office to schedule the follow-up appointment.    Thank you for choosing [facility] at [facility], LPN

## 2016-05-11 DIAGNOSIS — J441 Chronic obstructive pulmonary disease with (acute) exacerbation: Secondary | ICD-10-CM | POA: Diagnosis not present

## 2016-05-11 DIAGNOSIS — R0602 Shortness of breath: Secondary | ICD-10-CM | POA: Diagnosis not present

## 2016-06-23 DIAGNOSIS — F3181 Bipolar II disorder: Secondary | ICD-10-CM | POA: Diagnosis not present

## 2016-07-15 DIAGNOSIS — E1140 Type 2 diabetes mellitus with diabetic neuropathy, unspecified: Secondary | ICD-10-CM | POA: Diagnosis not present

## 2016-07-15 DIAGNOSIS — D692 Other nonthrombocytopenic purpura: Secondary | ICD-10-CM | POA: Diagnosis not present

## 2016-07-15 DIAGNOSIS — F3341 Major depressive disorder, recurrent, in partial remission: Secondary | ICD-10-CM | POA: Diagnosis not present

## 2016-07-15 DIAGNOSIS — R413 Other amnesia: Secondary | ICD-10-CM | POA: Diagnosis not present

## 2016-07-15 DIAGNOSIS — J449 Chronic obstructive pulmonary disease, unspecified: Secondary | ICD-10-CM | POA: Diagnosis not present

## 2016-07-15 DIAGNOSIS — N183 Chronic kidney disease, stage 3 (moderate): Secondary | ICD-10-CM | POA: Diagnosis not present

## 2016-07-15 DIAGNOSIS — Z6834 Body mass index (BMI) 34.0-34.9, adult: Secondary | ICD-10-CM | POA: Diagnosis not present

## 2016-07-15 DIAGNOSIS — R627 Adult failure to thrive: Secondary | ICD-10-CM | POA: Diagnosis not present

## 2016-07-15 DIAGNOSIS — I129 Hypertensive chronic kidney disease with stage 1 through stage 4 chronic kidney disease, or unspecified chronic kidney disease: Secondary | ICD-10-CM | POA: Diagnosis not present

## 2016-07-15 DIAGNOSIS — M353 Polymyalgia rheumatica: Secondary | ICD-10-CM | POA: Diagnosis not present

## 2016-07-15 DIAGNOSIS — E038 Other specified hypothyroidism: Secondary | ICD-10-CM | POA: Diagnosis not present

## 2016-07-16 DIAGNOSIS — G6289 Other specified polyneuropathies: Secondary | ICD-10-CM | POA: Diagnosis not present

## 2016-08-11 ENCOUNTER — Other Ambulatory Visit (HOSPITAL_COMMUNITY): Payer: Medicare Other

## 2016-08-31 ENCOUNTER — Ambulatory Visit: Payer: Medicare Other | Attending: Internal Medicine | Admitting: Physical Therapy

## 2016-08-31 DIAGNOSIS — M6281 Muscle weakness (generalized): Secondary | ICD-10-CM | POA: Diagnosis not present

## 2016-08-31 DIAGNOSIS — R2689 Other abnormalities of gait and mobility: Secondary | ICD-10-CM

## 2016-08-31 DIAGNOSIS — R2681 Unsteadiness on feet: Secondary | ICD-10-CM | POA: Insufficient documentation

## 2016-09-01 NOTE — Therapy (Signed)
[facility] [address] [place], [place], [address] Phone: [phone]   Fax:  [phone]  Physical Therapy Evaluation  Patient Details  Name: [name] MRN: [id number] Date of Birth: 05-14-1934 Referring Provider: Dr. Shon Baton  Encounter Date: 08/31/2016      PT End of Session - 09/01/16 2045    Visit Number 1   Number of Visits 17   Date for PT Re-Evaluation 10/30/16   Authorization Type Medicare    Authorization Time Period 08-31-16 - 10-30-16   PT Start Time 1405   PT Stop Time 1448   PT Time Calculation (min) 43 min      Past Medical History:  Diagnosis Date  . Abnormality of gait   . Arthritis   . Bifascicular bundle branch block    chronic  . Degenerative spinal arthritis    cervical and lumbar  . Dementia    early onset  . Dyspnea on exertion   . GERD (gastroesophageal reflux disease)   . History of CVA (cerebrovascular accident)    03-31-2012--  right cerebellar infart without hemorrhgia  . History of gastric ulcer    2011  w/ bleed  . History of kidney stones   . History of squamous cell carcinoma excision   . Hyperlipidemia   . Hypertension   . Hypothyroidism   . Obstructive sleep apnea    has not been using -- but states has appointment soon to get refitted   . Polymyalgia rheumatica ([place])   . Stroke ([place])   . Type 2 diabetes mellitus ([place])   . Urge urinary incontinence    refactory    Past Surgical History:  Procedure Laterality Date  . APPENDECTOMY  as child  . HERNIA REPAIR  1956  . INTERSTIM IMPLANT PLACEMENT N/A 11/19/2014   Procedure: Barrie Lyme IMPLANT FIRST STAGE AND ;  Surgeon: Bjorn Loser, MD;  Location: [facility];  Service: Urology;  Laterality: N/A;  . INTERSTIM IMPLANT PLACEMENT N/A 11/19/2014   Procedure: Barrie Lyme IMPLANT SECOND STAGE;  Surgeon: Bjorn Loser, MD;  Location: [facility];  Service: Urology;  Laterality: N/A;   . ORIF RIGHT WRIST FX  03-04-2004  . SHOULDER ARTHROSCOPY / DEBRIDEMENT/  SAD/  DCR/  MINI OPEN ROTATOR CUFF REPAIR Right 01-09-2003  . TEE WITHOUT CARDIOVERSION N/A 09/12/2012   Procedure: TRANSESOPHAGEAL ECHOCARDIOGRAM (TEE);  Surgeon: Lelon Perla, MD;  Location: [facility] ENDOSCOPY;  Service: Cardiovascular;  Laterality: N/A;  proximal septal thickening and chordal SAM creatin a narrow LVOT; turbulence noted  . TONSILLECTOMY  as child  . TOTAL KNEE ARTHROPLASTY Left 11-12-2004  . TRANSTHORACIC ECHOCARDIOGRAM  09-10-2012   mild LVH, mild  focal basal hypertrophy of the septum,  ef 11-94%, grade I diastolic dysfunction/  trival AR /  MV with moderate systolic anterior motion of the chordal structures/  mild LAE    There were no vitals filed for this visit.       Subjective Assessment - 09/01/16 1941    Subjective Pt reports she has had 4 recent falls - had to call EMT to assist her in getting up: states she uses rollator in the home and is using SPC today - states she doesn't want to use it out in community; pt is accompanied to PT by her son -he is reporting pt's medical history as she states she is beginning to have some dementia:  son states pt had major CVA 3 yrs ago and then had  12 mini strokes and went back to inpatient rehab ; he states pt has been seen 3 times at this facility for PT in the past   Pertinent History CVA 3 yrs ago:  several mini strokes per son's report; Type II DM:  polymyalgia rheumatica: spinal arthritis:  early onset dementia:  Dyspnea on exertion   Patient Stated Goals Improve mobility and balance            [facility] PT Assessment - 09/01/16 0001      Assessment   Medical Diagnosis Gait Abnormality   Referring Provider Dr. Shon Baton   Onset Date/Surgical Date --  2014 for CVA:  April 2018 for onset of falls     Precautions   Precautions Fall     Balance Screen   Has the patient fallen in the past 6 months Yes   How many times? 6   Has the patient had a  decrease in activity level because of a fear of falling?  Yes   Is the patient reluctant to leave their home because of a fear of falling?  Yes     [place] residence   Type of [place] to enter   Entrance Stairs-Number of Steps 4   Entrance Stairs-Rails Can reach both   [place] One level     Prior Function   Level of Independence Independent with basic ADLs;Independent with household mobility with device;Needs assistance with homemaking  pt has care attendant coming 1 day/wk for 4 hrs     ROM / Strength   AROM / PROM / Strength Strength     Strength   Overall Strength Within functional limits for tasks performed     Ambulation/Gait   Ambulation/Gait Yes   Ambulation/Gait Assistance 5: Supervision   Ambulation Distance (Feet) 100 Feet   Assistive device Straight cane  uses SPC in community and Rollator in her home   Gait Pattern Decreased hip/knee flexion - right;Decreased stride length;Decreased step length - left;Decreased step length - right;Shuffle   Ambulation Surface Level;Indoor   Gait velocity 23.90 secs = 1.37 ft/sec     Standardized Balance Assessment   Standardized Balance Assessment Timed Up and Go Test     Berg Balance Test   Sit to Stand Able to stand  independently using hands   Standing Unsupported Able to stand safely 2 minutes   Sitting with Back Unsupported but Feet Supported on Floor or Stool Able to sit safely and securely 2 minutes   Stand to Sit Sits safely with minimal use of hands   Transfers Able to transfer safely, definite need of hands   Standing Unsupported with Eyes Closed Able to stand 10 seconds with supervision   Standing Ubsupported with Feet Together Able to place feet together independently and stand for 1 minute with supervision   From Standing, Reach Forward with Outstretched Arm Can reach forward >12 cm safely (5")   From Standing Position, Pick up Object from Floor  Unable to try/needs assist to keep balance   From Standing Position, Turn to Look Behind Over each Shoulder Turn sideways only but maintains balance   Turn 360 Degrees Needs close supervision or verbal cueing   Standing Unsupported, Alternately Place Feet on Step/Stool Able to complete >2 steps/needs minimal assist   Standing Unsupported, One Foot in Front Able to take small step independently and hold 30 seconds   Standing on One Leg Tries to lift  leg/unable to hold 3 seconds but remains standing independently   Total Score 34     Timed Up and Go Test   Normal TUG (seconds) 26.53   TUG Comments No device used per pt's request      Self Care; discussed benefits/need for use of rollator for assistance with amb in community with pt and son - for safety with amb. In order to reduce  Fall risk - son verbalized understanding      Objective measurements completed on examination: See above findings.                    PT Short Term Goals - 09/01/16 2026      PT SHORT TERM GOAL #1   Title Pt will be independent with HEP to address balance and strength.                   10-01-16 TARGET DATE   Time 4   Period Weeks   Status New     PT SHORT TERM GOAL #2   Title Pt will increase gait velocity from 1.37 ft/sec to 1.7 ft/sec with no device (as this is how assessed at eval per pt's request to perform amb. tests without cane).   10-01-16 TARGET DATE   Baseline 1.37 ft/sec with no device   Time 4   Period Weeks   Status New     PT SHORT TERM GOAL #3   Title The patient will improve Berg from 34/56 up to 39/56 to demo decreasing risk for falls.    TARGET DATE 10-01-16   Time 4   Period Weeks   Status New     PT SHORT TERM GOAL #4   Title The patient will decrease TUG score from 26.53 seconds to < or equal to 22 seconds with no device to demo decreasing risk for falls.     10-01-16   Time 4   Period Weeks   Status New     PT SHORT TERM GOAL #5   Title Amb. 3" nonstop with  RW to demo improved endurance/activity tolerance.   10-01-16   Time 4   Period Weeks   Status New           PT Long Term Goals - 09/01/16 2030      PT LONG TERM GOAL #1   Title The patient will improve gait speed from 1.37 ft/sec to >/= 2.0  ft/sec with no device to demo improving mobility.   Target date 10-30-16   Baseline 1.37 ft/sec with no device (pt requested to not use cane )   Time 8   Period Weeks   Status New     PT LONG TERM GOAL #2   Title The patient will improve Berg from 34/56 up to 42/56 to demo decreasing risk for falls.  10-30-16 TARGET DATE   Baseline 34/56 on 08-31-16   Time 8   Period Weeks   Status New     PT LONG TERM GOAL #3   Title The patient will negotiate 4 steps with one handrail and reciprocal pattern with SBA to demo improved lower extremity strength.  10-30-16   Time 8   Period Weeks   Status New     PT LONG TERM GOAL #4   Title Pt will amb. 400' with rollator on flat even and uneven pavement for incr. community accessibility with SBA.  10-30-16   Time 8   Period Weeks  Status New     PT LONG TERM GOAL #5   Title The patient will decrease TUG score from 26.53 seconds to < or equal to 19 seconds with no device to demo decreasing risk for falls. Target date 10-30-16   Time 8   Period Weeks   Status New                Plan - 09/01/16 2007    Clinical Impression Statement Pt is a 82 year old lady s/p CVA in 2014 with gait abnormality and decreased balance and decr. mobility due to very sedentary lifestyle.  Pt has early onset dementia and is accompanied to PT by her son who provides medical history.  Pt has DOE and has decreased endurance/activity tolerance. Pt is at high fall risk per Berg score of 34/56 and TUG score of 26.53 secs.  Pt is using SPC for assistance with amb. but uses a rollator at home only, stating she does not like to use it in community because "I don't like to look like I am handicapped".  PMH includes HTN< Type II DM, DOE,  CVA in 2014 with multiple mini strokes per son's report and polymyalgia rheumatica.  Moderate complexity evaluation in PT with moderate decision making required in POC.                                                                                                                                                                                                                                                                                       History and Personal Factors relevant to plan of care: Early onset dementia; lack of caregiver assistance during the day (caregiver coming only 1 day/week for 3-4 hours only):  s/p CVA; Dyspnea on exertion and sedentary lifestyle with fear of of fallling   Clinical Presentation Evolving   Clinical Presentation due to: s/p CVA in 2014, DOE and Type II DM; spinal DDD - cervical and lumbar   Clinical Decision Making Moderate   Rehab Potential Good   Clinical Impairments Affecting Rehab Potential length of time since initial CVA (4 years);  early onset dementia; co-morbidities and lack of caregiver  support during the day resulting in sedentary lifestyle   PT Frequency 2x / week   PT Duration 8 weeks   PT Treatment/Interventions ADLs/Self Care Home Management;DME Instruction;Gait training;Stair training;Functional mobility training;Therapeutic activities;Therapeutic exercise;Balance training;Neuromuscular re-education;Patient/family education   PT Next Visit Plan instruct in HEP - functional strengthening and balance exercises (basic);  gait with RW/pt's rollator   PT Home Exercise Plan see above   Consulted and Agree with Plan of Care Patient;Family member/caregiver   Family Member Consulted son [name]      Patient will benefit from skilled therapeutic intervention in order to improve the following deficits and impairments:  Abnormal gait, Decreased endurance, Decreased activity tolerance, Decreased balance, Decreased cognition, Decreased strength, Pain, Postural  dysfunction  Visit Diagnosis: Other abnormalities of gait and mobility - Plan: PT plan of care cert/re-cert  Muscle weakness (generalized) - Plan: PT plan of care cert/re-cert  Unsteadiness on feet - Plan: PT plan of care cert/re-cert      G-Codes - 11/55/20 2040    Functional Assessment Tool Used (Outpatient Only) Berg score 34/56:  TUg score 26.53 secs with no device:  Gait velocity 1.37 ft/sec with no device   Functional Limitation Mobility: Walking and moving around   Mobility: Walking and Moving Around Current Status [phone]) At least 60 percent but less than 80 percent impaired, limited or restricted   Mobility: Walking and Moving Around Goal Status [phone]) At least 40 percent but less than 60 percent impaired, limited or restricted       Problem List Patient Active Problem List   Diagnosis Date Noted  . TIA (transient ischemic attack) 09/08/2012  . CVA (cerebral infarction) 04/04/2012  . Cerebellar stroke, acute ([place]) 04/01/2012  . 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 Nausea and vomiting 04/01/2012  . Polymyalgia rheumatica ([place]) 04/01/2012  . Bifascicular block   . Hypertension   . Hyperlipidemia   . Obstructive sleep apnea   . Diabetes mellitus [facility])     Keionte Swicegood, [place], PT 09/01/2016, 8:46 PM  [facility] [address] [place], [place], [address] Phone: [phone]   Fax:  [phone]  Name: [name] MRN: [id number] Date of Birth: [birth year]

## 2016-09-08 ENCOUNTER — Ambulatory Visit: Payer: Medicare Other | Admitting: Physical Therapy

## 2016-09-08 ENCOUNTER — Encounter: Payer: Self-pay | Admitting: Physical Therapy

## 2016-09-08 DIAGNOSIS — R2689 Other abnormalities of gait and mobility: Secondary | ICD-10-CM | POA: Diagnosis not present

## 2016-09-08 DIAGNOSIS — M6281 Muscle weakness (generalized): Secondary | ICD-10-CM

## 2016-09-08 DIAGNOSIS — R2681 Unsteadiness on feet: Secondary | ICD-10-CM | POA: Diagnosis not present

## 2016-09-09 NOTE — Therapy (Signed)
[facility] [address] [place], [place], [address] Phone: [phone]   Fax:  [phone]  Physical Therapy Treatment  Patient Details  Name: [name] MRN: [id number] Date of Birth: 1934/05/15 Referring Provider: Dr. Shon Baton  Encounter Date: 09/08/2016      PT End of Session - 09/08/16 1615    Visit Number 2   Number of Visits 17   Date for PT Re-Evaluation 10/30/16   Authorization Type Medicare    Authorization Time Period 08-31-16 - 10-30-16   PT Start Time 1536   PT Stop Time 1615   PT Time Calculation (min) 39 min      Past Medical History:  Diagnosis Date  . Abnormality of gait   . Arthritis   . Bifascicular bundle branch block    chronic  . Degenerative spinal arthritis    cervical and lumbar  . Dementia    early onset  . Dyspnea on exertion   . GERD (gastroesophageal reflux disease)   . History of CVA (cerebrovascular accident)    03-31-2012--  right cerebellar infart without hemorrhgia  . History of gastric ulcer    2011  w/ bleed  . History of kidney stones   . History of squamous cell carcinoma excision   . Hyperlipidemia   . Hypertension   . Hypothyroidism   . Obstructive sleep apnea    has not been using -- but states has appointment soon to get refitted   . Polymyalgia rheumatica ([place])   . Stroke ([place])   . Type 2 diabetes mellitus ([place])   . Urge urinary incontinence    refactory    Past Surgical History:  Procedure Laterality Date  . APPENDECTOMY  as child  . HERNIA REPAIR  1956  . INTERSTIM IMPLANT PLACEMENT N/A 11/19/2014   Procedure: Barrie Lyme IMPLANT FIRST STAGE AND ;  Surgeon: Bjorn Loser, MD;  Location: [facility];  Service: Urology;  Laterality: N/A;  . INTERSTIM IMPLANT PLACEMENT N/A 11/19/2014   Procedure: Barrie Lyme IMPLANT SECOND STAGE;  Surgeon: Bjorn Loser, MD;  Location[facility];  Service: Urology;  Laterality: N/A;   . ORIF RIGHT WRIST FX  03-04-2004  . SHOULDER ARTHROSCOPY / DEBRIDEMENT/  SAD/  DCR/  MINI OPEN ROTATOR CUFF REPAIR Right 01-09-2003  . TEE WITHOUT CARDIOVERSION N/A 09/12/2012   Procedure: TRANSESOPHAGEAL ECHOCARDIOGRAM (TEE);  Surgeon: Lelon Perla, MD;  Location: [facility] ENDOSCOPY;  Service: Cardiovascular;  Laterality: N/A;  proximal septal thickening and chordal SAM creatin a narrow LVOT; turbulence noted  . TONSILLECTOMY  as child  . TOTAL KNEE ARTHROPLASTY Left 11-12-2004  . TRANSTHORACIC ECHOCARDIOGRAM  09-10-2012   mild LVH, mild  focal basal hypertrophy of the septum,  ef 69-62%, grade I diastolic dysfunction/  trival AR /  MV with moderate systolic anterior motion of the chordal structures/  mild LAE    There were no vitals filed for this visit.      Subjective Assessment - 09/08/16 1540    Subjective Patient reported to PT with rollator. She denied any falls or pain today.    Patient is accompained by: Family member   Patient Stated Goals Improve mobility and balance   Currently in Pain? No/denies             Balance Exercises - 09/08/16 1545      OTAGO PROGRAM   Head Movements Sitting;5 reps   Back Extension Standing;5 reps   Trunk Movements Standing;5 reps  Ankle Movements Sitting;10 reps   Knee Extensor 10 reps  with red band around ankles   Knee Flexor 10 reps   Hip ABductor 10 reps   Ankle Plantorflexors 20 reps, support   Ankle Dorsiflexors 20 reps, support   Knee Bends 10 reps, support   Backwards Walking Support   Tandem Stance 10 seconds, support   One Leg Stand 10 seconds, support             PT Short Term Goals - 09/01/16 2026      PT SHORT TERM GOAL #1   Title Pt will be independent with HEP to address balance and strength.                   10-01-16 TARGET DATE   Time 4   Period Weeks   Status New     PT SHORT TERM GOAL #2   Title Pt will increase gait velocity from 1.37 ft/sec to 1.7 ft/sec with no device (as this is how assessed  at eval per pt's request to perform amb. tests without cane).   10-01-16 TARGET DATE   Baseline 1.37 ft/sec with no device   Time 4   Period Weeks   Status New     PT SHORT TERM GOAL #3   Title The patient will improve Berg from 34/56 up to 39/56 to demo decreasing risk for falls.    TARGET DATE 10-01-16   Time 4   Period Weeks   Status New     PT SHORT TERM GOAL #4   Title The patient will decrease TUG score from 26.53 seconds to < or equal to 22 seconds with no device to demo decreasing risk for falls.     10-01-16   Time 4   Period Weeks   Status New     PT SHORT TERM GOAL #5   Title Amb. 3" nonstop with RW to demo improved endurance/activity tolerance.   10-01-16   Time 4   Period Weeks   Status New           PT Long Term Goals - 09/01/16 2030      PT LONG TERM GOAL #1   Title The patient will improve gait speed from 1.37 ft/sec to >/= 2.0  ft/sec with no device to demo improving mobility.   Target date 10-30-16   Baseline 1.37 ft/sec with no device (pt requested to not use cane )   Time 8   Period Weeks   Status New     PT LONG TERM GOAL #2   Title The patient will improve Berg from 34/56 up to 42/56 to demo decreasing risk for falls.  10-30-16 TARGET DATE   Baseline 34/56 on 08-31-16   Time 8   Period Weeks   Status New     PT LONG TERM GOAL #3   Title The patient will negotiate 4 steps with one handrail and reciprocal pattern with SBA to demo improved lower extremity strength.  10-30-16   Time 8   Period Weeks   Status New     PT LONG TERM GOAL #4   Title Pt will amb. 400' with rollator on flat even and uneven pavement for incr. community accessibility with SBA.  10-30-16   Time 8   Period Weeks   Status New     PT LONG TERM GOAL #5   Title The patient will decrease TUG score from 26.53 seconds to < or equal to 19  seconds with no device to demo decreasing risk for falls. Target date 10-30-16   Time 8   Period Weeks   Status New            Plan - 09/08/16  1620    Clinical Impression Statement Today's skilled session focused on establishement of HEP by reviewing and issuing parts of OTAGO today. Pt was able to demo them in session with seated rest breaks due to fatigue. Pt is progressing toward goals and should benefit from continued PT to progress toward unmet goals.    Rehab Potential Good   Clinical Impairments Affecting Rehab Potential length of time since initial CVA (4 years);  early onset dementia; co-morbidities and lack of caregiver support during the day resulting in sedentary lifestyle   PT Frequency 2x / week   PT Duration 8 weeks   PT Treatment/Interventions ADLs/Self Care Home Management;DME Instruction;Gait training;Stair training;Functional mobility training;Therapeutic activities;Therapeutic exercise;Balance training;Neuromuscular re-education;Patient/family education   PT Next Visit Plan review and complete OTAGO program for HEP;  gait with RW/pt's rollator with emphasis on brake use/safety with gait   PT Home Exercise Plan see above   Consulted and Agree with Plan of Care Patient;Family member/caregiver   Family Member Consulted son [name]      Patient will benefit from skilled therapeutic intervention in order to improve the following deficits and impairments:  Abnormal gait, Decreased endurance, Decreased activity tolerance, Decreased balance, Decreased cognition, Decreased strength, Pain, Postural dysfunction  Visit Diagnosis: Muscle weakness (generalized)  Unsteadiness on feet     Problem List Patient Active Problem List   Diagnosis Date Noted  . TIA (transient ischemic attack) 09/08/2012  . CVA (cerebral infarction) 04/04/2012  . Cerebellar stroke, acute ([place]) 04/01/2012  . 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 Nausea and vomiting 04/01/2012  . Polymyalgia rheumatica ([place]) 04/01/2012  . Bifascicular block   . Hypertension   . Hyperlipidemia   . Obstructive sleep apnea   . Diabetes mellitus ([place])      Willow Ora, PTA, [facility] [address], [place], [place] [address] [phone] 09/09/16, 2:11 PM   Name: [name] MRN: [id number] Date of Birth: 1934/08/23

## 2016-09-10 ENCOUNTER — Ambulatory Visit: Payer: Medicare Other | Admitting: Physical Therapy

## 2016-09-10 DIAGNOSIS — R2681 Unsteadiness on feet: Secondary | ICD-10-CM

## 2016-09-10 DIAGNOSIS — R2689 Other abnormalities of gait and mobility: Secondary | ICD-10-CM | POA: Diagnosis not present

## 2016-09-10 DIAGNOSIS — M6281 Muscle weakness (generalized): Secondary | ICD-10-CM

## 2016-09-10 NOTE — Therapy (Signed)
[facility] [address] [place], [place], [address] Phone: [phone]   Fax:  [phone]  Physical Therapy Treatment  Patient Details  Name: [name] MRN: [id number] Date of Birth: 21-Jul-1934 Referring Provider: Dr. Shon Baton  Encounter Date: 09/10/2016      PT End of Session - 09/10/16 1614    Visit Number 3   Number of Visits 17   Date for PT Re-Evaluation 10/30/16   Authorization Type Medicare    Authorization Time Period 08-31-16 - 10-30-16   PT Start Time 1531   PT Stop Time 1613   PT Time Calculation (min) 42 min   Equipment Utilized During Treatment Gait belt   Activity Tolerance Patient tolerated treatment well   Behavior During Therapy [facility] for tasks assessed/performed      Past Medical History:  Diagnosis Date  . Abnormality of gait   . Arthritis   . Bifascicular bundle branch block    chronic  . Degenerative spinal arthritis    cervical and lumbar  . Dementia    early onset  . Dyspnea on exertion   . GERD (gastroesophageal reflux disease)   . History of CVA (cerebrovascular accident)    03-31-2012--  right cerebellar infart without hemorrhgia  . History of gastric ulcer    2011  w/ bleed  . History of kidney stones   . History of squamous cell carcinoma excision   . Hyperlipidemia   . Hypertension   . Hypothyroidism   . Obstructive sleep apnea    has not been using -- but states has appointment soon to get refitted   . Polymyalgia rheumatica ([place])   . Stroke ([place])   . Type 2 diabetes mellitus ([place])   . Urge urinary incontinence    refactory    Past Surgical History:  Procedure Laterality Date  . APPENDECTOMY  as child  . HERNIA REPAIR  1956  . INTERSTIM IMPLANT PLACEMENT N/A 11/19/2014   Procedure: Barrie Lyme IMPLANT FIRST STAGE AND ;  Surgeon: Bjorn Loser, MD;  Location[facility];  Service: Urology;  Laterality: N/A;  . INTERSTIM IMPLANT PLACEMENT N/A  11/19/2014   Procedure: Barrie Lyme IMPLANT SECOND STAGE;  Surgeon: Bjorn Loser, MD;  Location: [facility];  Service: Urology;  Laterality: N/A;  . ORIF RIGHT WRIST FX  03-04-2004  . SHOULDER ARTHROSCOPY / DEBRIDEMENT/  SAD/  DCR/  MINI OPEN ROTATOR CUFF REPAIR Right 01-09-2003  . TEE WITHOUT CARDIOVERSION N/A 09/12/2012   Procedure: TRANSESOPHAGEAL ECHOCARDIOGRAM (TEE);  Surgeon: Lelon Perla, MD;  Location: [facility] ENDOSCOPY;  Service: Cardiovascular;  Laterality: N/A;  proximal septal thickening and chordal SAM creatin a narrow LVOT; turbulence noted  . TONSILLECTOMY  as child  . TOTAL KNEE ARTHROPLASTY Left 11-12-2004  . TRANSTHORACIC ECHOCARDIOGRAM  09-10-2012   mild LVH, mild  focal basal hypertrophy of the septum,  ef 40-34%, grade I diastolic dysfunction/  trival AR /  MV with moderate systolic anterior motion of the chordal structures/  mild LAE    There were no vitals filed for this visit.      Subjective Assessment - 09/10/16 1612    Subjective No falls, pain or changes reported.  She said she hasn't done any of the exercises given at last treatment because she has been too busy packing for the beach.   Pertinent History CVA 3 yrs ago:  several mini strokes per son's report; Type II DM:  polymyalgia rheumatica: spinal arthritis:  early  onset dementia:  Dyspnea on exertion   Patient Stated Goals Improve mobility and balance   Currently in Pain? No/denies           Balance Exercises - 09/10/16 1552      OTAGO PROGRAM   Head Movements Sitting;5 reps   Neck Movements Sitting;5 reps   Back Extension Standing;5 reps   Trunk Movements Standing;5 reps   Hip ABductor 10 reps   Ankle Plantorflexors 20 reps, support   Ankle Dorsiflexors 20 reps, support   Knee Bends 10 reps, support   Walking and Turning Around No assistive device   Sideways Walking Assistive device   Tandem Walk Support   Heel Walking Support   Sit to Stand 10 reps, bilateral support            PT Education - 09/10/16 1615    Education provided Yes   Education Details Discussed and added 2 more OTAGO exercises for pt.'s HEP, lateral side walks with support and sit to stand with bilateral UE support. Other new ones performed this session required at least min guard assist and are not safe for her to do on her own at this time. Will reassess them for home at a later date during this plan of care.           PT Short Term Goals - 09/01/16 2026      PT SHORT TERM GOAL #1   Title Pt will be independent with HEP to address balance and strength.                   10-01-16 TARGET DATE   Time 4   Period Weeks   Status New     PT SHORT TERM GOAL #2   Title Pt will increase gait velocity from 1.37 ft/sec to 1.7 ft/sec with no device (as this is how assessed at eval per pt's request to perform amb. tests without cane).   10-01-16 TARGET DATE   Baseline 1.37 ft/sec with no device   Time 4   Period Weeks   Status New     PT SHORT TERM GOAL #3   Title The patient will improve Berg from 34/56 up to 39/56 to demo decreasing risk for falls.    TARGET DATE 10-01-16   Time 4   Period Weeks   Status New     PT SHORT TERM GOAL #4   Title The patient will decrease TUG score from 26.53 seconds to < or equal to 22 seconds with no device to demo decreasing risk for falls.     10-01-16   Time 4   Period Weeks   Status New     PT SHORT TERM GOAL #5   Title Amb. 3" nonstop with RW to demo improved endurance/activity tolerance.   10-01-16   Time 4   Period Weeks   Status New           PT Long Term Goals - 09/01/16 2030      PT LONG TERM GOAL #1   Title The patient will improve gait speed from 1.37 ft/sec to >/= 2.0  ft/sec with no device to demo improving mobility.   Target date 10-30-16   Baseline 1.37 ft/sec with no device (pt requested to not use cane )   Time 8   Period Weeks   Status New     PT LONG TERM GOAL #2   Title The patient will improve Berg from 34/56 up to  42/56 to demo decreasing risk for falls.  10-30-16 TARGET DATE   Baseline 34/56 on 08-31-16   Time 8   Period Weeks   Status New     PT LONG TERM GOAL #3   Title The patient will negotiate 4 steps with one handrail and reciprocal pattern with SBA to demo improved lower extremity strength.  10-30-16   Time 8   Period Weeks   Status New     PT LONG TERM GOAL #4   Title Pt will amb. 400' with rollator on flat even and uneven pavement for incr. community accessibility with SBA.  10-30-16   Time 8   Period Weeks   Status New     PT LONG TERM GOAL #5   Title The patient will decrease TUG score from 26.53 seconds to < or equal to 19 seconds with no device to demo decreasing risk for falls. Target date 10-30-16   Time 8   Period Weeks   Status New               Plan - 09/10/16 1631    Clinical Impression Statement Today's session focused on adding additional OTAGO exercises to HEP. Pt. demonstrated ability to perform lateral side walks and sit to stands with bilateral UE support with accuracy given min VC's.  Toe walking, heel walking, and walking and "turning around" were held for home program until balance and LE strength improves.  Patient will benefit from continued skilled PT to progress towards unmet goals.   Rehab Potential Good   Clinical Impairments Affecting Rehab Potential length of time since initial CVA (4 years);  early onset dementia; co-morbidities and lack of caregiver support during the day resulting in sedentary lifestyle   PT Frequency 2x / week   PT Duration 8 weeks   PT Treatment/Interventions ADLs/Self Care Home Management;DME Instruction;Gait training;Stair training;Functional mobility training;Therapeutic activities;Therapeutic exercise;Balance training;Neuromuscular re-education;Patient/family education   PT Next Visit Plan gait with RW/pt's rollator with emphasis on brake use/safety with gait, standing and dynamic balance    Consulted and Agree with Plan of Care  Patient      Patient will benefit from skilled therapeutic intervention in order to improve the following deficits and impairments:  Abnormal gait, Decreased endurance, Decreased activity tolerance, Decreased balance, Decreased cognition, Decreased strength, Pain, Postural dysfunction  Visit Diagnosis: Muscle weakness (generalized)  Unsteadiness on feet     Problem List Patient Active Problem List   Diagnosis Date Noted  . TIA (transient ischemic attack) 09/08/2012  . CVA (cerebral infarction) 04/04/2012  . Cerebellar stroke, acute ([place]) 04/01/2012  . 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 Nausea and vomiting 04/01/2012  . Polymyalgia rheumatica ([place]) 04/01/2012  . Bifascicular block   . Hypertension   . Hyperlipidemia   . Obstructive sleep apnea   . Diabetes mellitus ([place])     Rulon Eisenmenger, SPTA 09/10/2016, 4:32 PM  [facility] [address] [place], [place], [address] Phone: [phone]   Fax:  [phone]  Name: ERIKKA FOLLMER MRN: [id number] Date of Birth: 1934-10-03  This note has been reviewed and edited by supervising CI.  Willow Ora, PTA, [facility] [address] [place], [place] [address] [phone] 09/11/16, 6:08 PM

## 2016-09-22 DIAGNOSIS — N3944 Nocturnal enuresis: Secondary | ICD-10-CM | POA: Diagnosis not present

## 2016-09-22 DIAGNOSIS — N3946 Mixed incontinence: Secondary | ICD-10-CM | POA: Diagnosis not present

## 2016-09-29 DIAGNOSIS — H2513 Age-related nuclear cataract, bilateral: Secondary | ICD-10-CM | POA: Diagnosis not present

## 2016-10-02 IMAGING — CT CT HEAD W/O CM
1 of 2 series · 16 of 30 positions shown, 20 images · non-contrast
Comparison: 09/08/2012

CLINICAL DATA: Sudden onset posterior head pain after falling 2 hr
ago.

EXAM:
CT HEAD WITHOUT CONTRAST
TECHNIQUE: Contiguous axial images were obtained from the base of the skull
through the vertex without intravenous contrast.

[Series 2: head 4.8 h37s · axial · 0.49mm/px · z∈[+1300,+1440]mm · 16 of 32 slices shown, 20 images]
[im 2/32  brain]
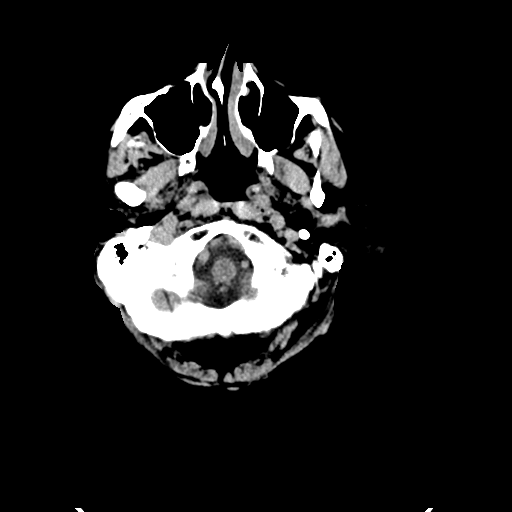
[im 2/32  bone]
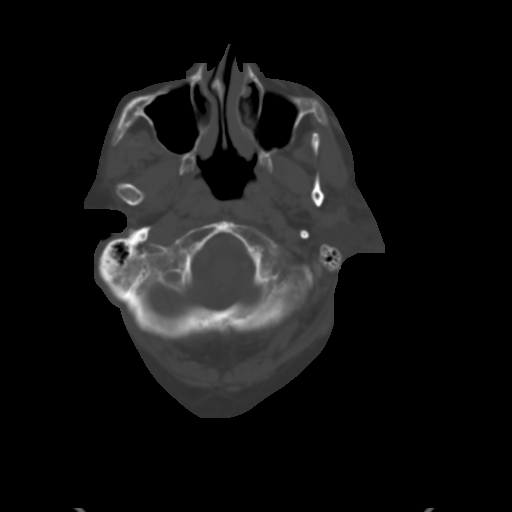
[im 4/32  brain]
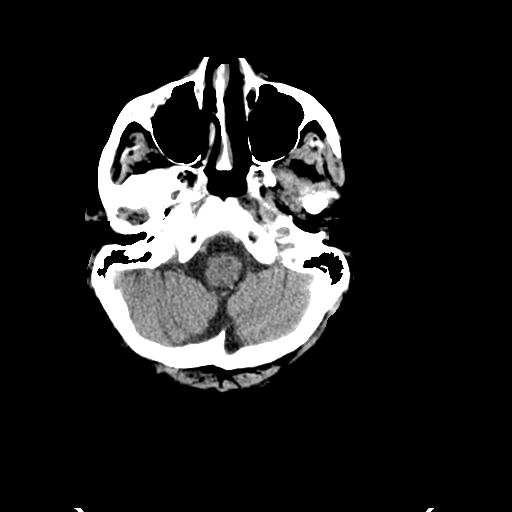
[im 5/32  brain]
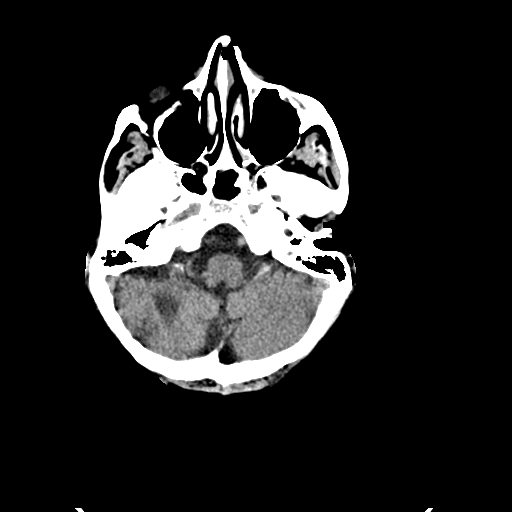
[im 7/32  brain]
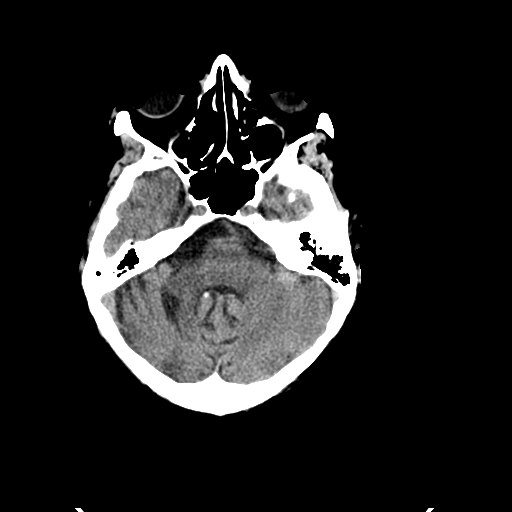
[im 10/32  brain]
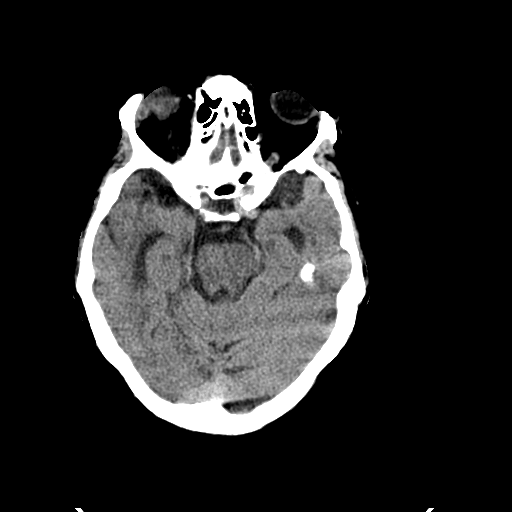
[im 10/32  bone]
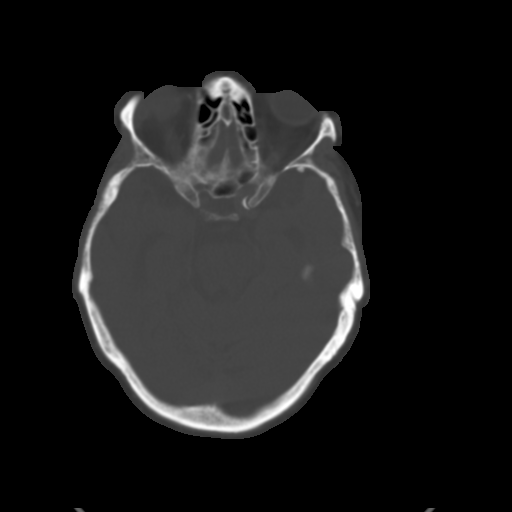
[im 12/32  brain]
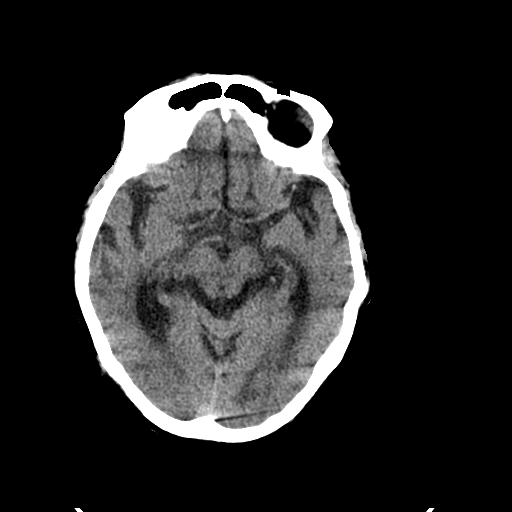
[im 14/32  brain]
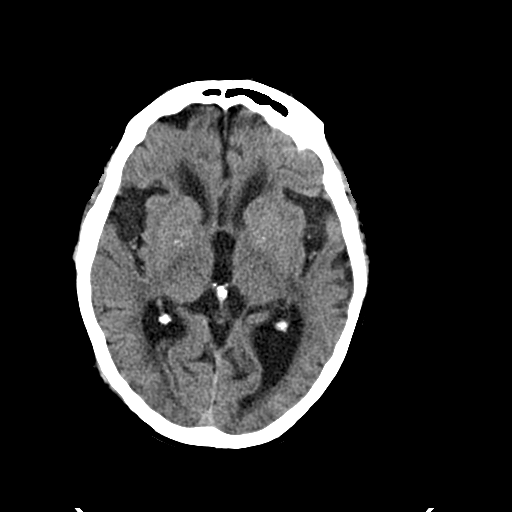
[im 15/32  brain]
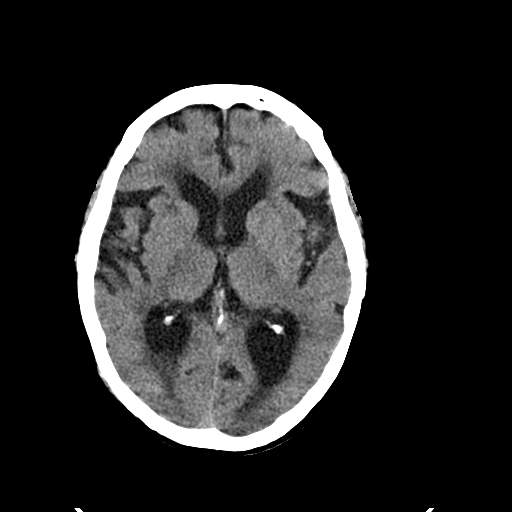
[im 17/32  brain]
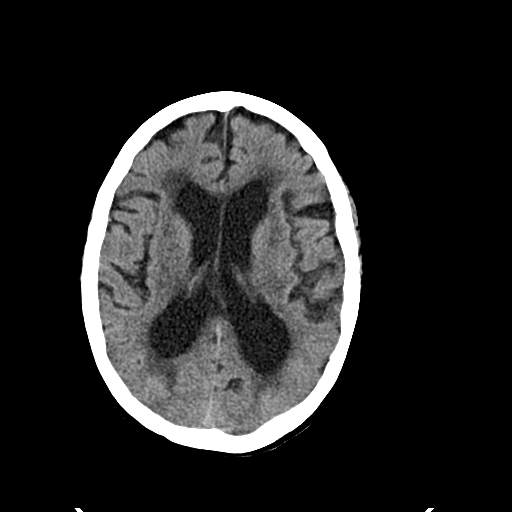
[im 17/32  bone]
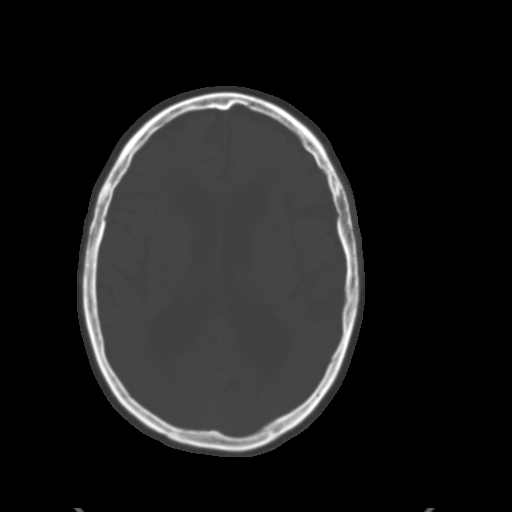
[im 18/32  brain]
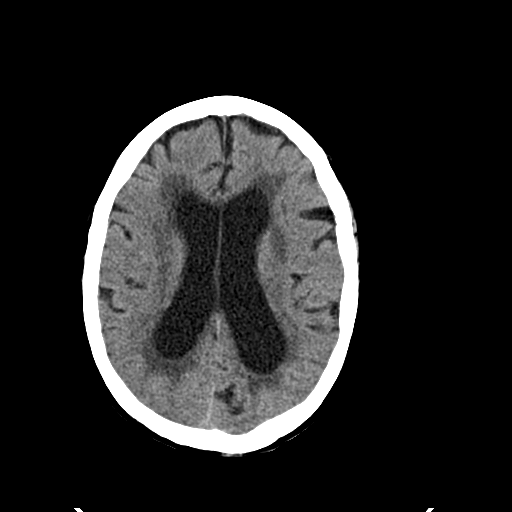
[im 20/32  brain]
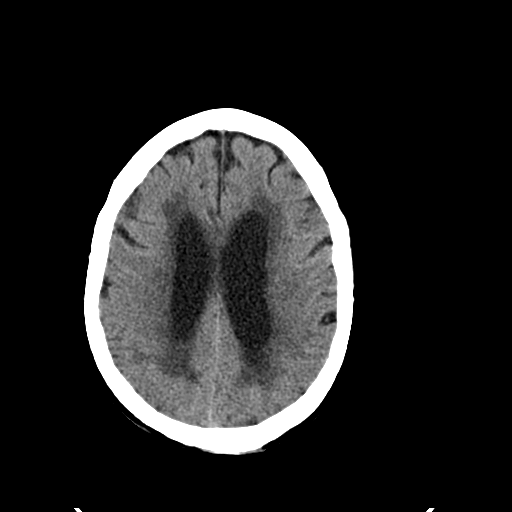
[im 22/32  brain]
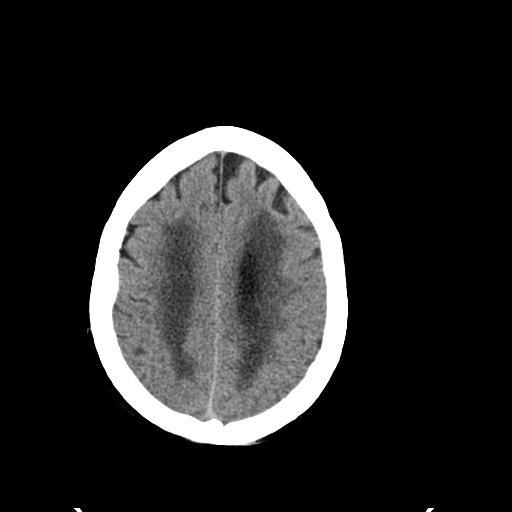
[im 25/32  brain]
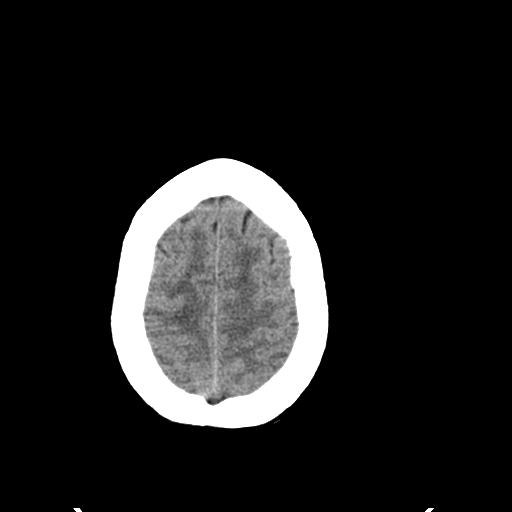
[im 25/32  bone]
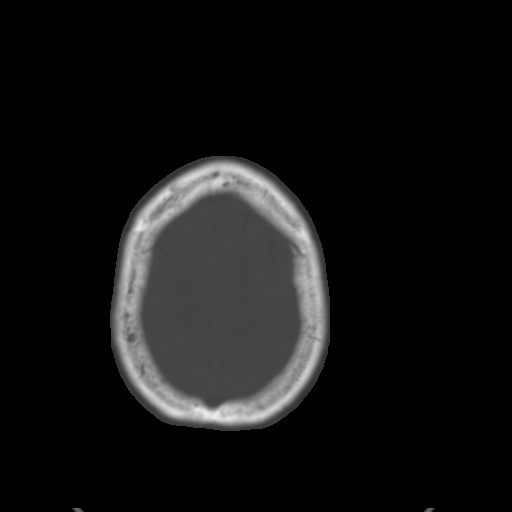
[im 27/32  brain]
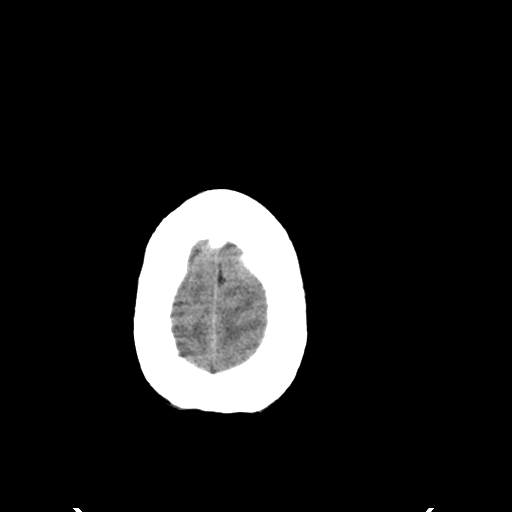
[im 28/32  brain]
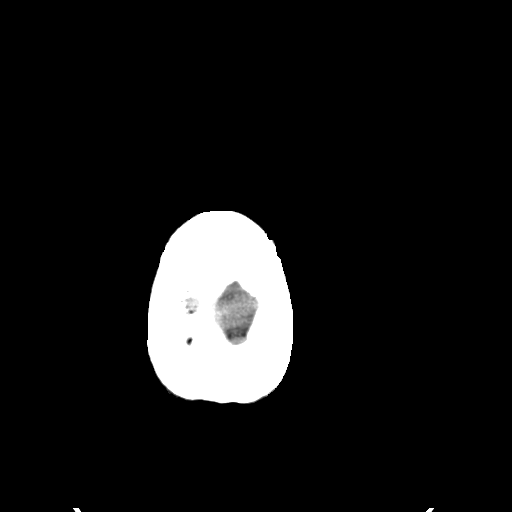
[im 30/32  brain]
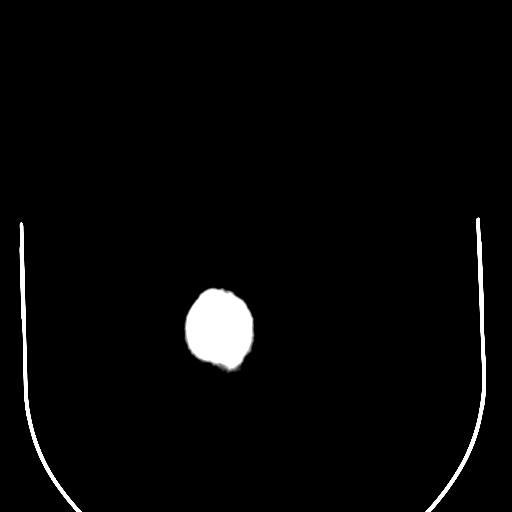

[16 of 30 positions shown; findings below may reference images not displayed]

FINDINGS: There is no intracranial hemorrhage, mass or evidence of acute
infarction. There is no extra-axial fluid collection. There is
severe generalized atrophy. There is severe white matter hypodensity
consistent with chronic small vessel disease. There is remote right
cerebellar infarction no interval change is evident from 09/08/2012.

Visible paranasal sinuses are clear. No bone abnormality is evident.
IMPRESSION: Severe atrophy, chronic right cerebellar infarction and chronic
small vessel ischemic disease. No acute findings.

## 2016-10-11 DIAGNOSIS — I129 Hypertensive chronic kidney disease with stage 1 through stage 4 chronic kidney disease, or unspecified chronic kidney disease: Secondary | ICD-10-CM | POA: Diagnosis not present

## 2016-10-11 DIAGNOSIS — E1140 Type 2 diabetes mellitus with diabetic neuropathy, unspecified: Secondary | ICD-10-CM | POA: Diagnosis not present

## 2016-10-11 DIAGNOSIS — R2689 Other abnormalities of gait and mobility: Secondary | ICD-10-CM | POA: Diagnosis not present

## 2016-10-11 DIAGNOSIS — E038 Other specified hypothyroidism: Secondary | ICD-10-CM | POA: Diagnosis not present

## 2016-10-11 DIAGNOSIS — Z6835 Body mass index (BMI) 35.0-35.9, adult: Secondary | ICD-10-CM | POA: Diagnosis not present

## 2016-10-11 DIAGNOSIS — J449 Chronic obstructive pulmonary disease, unspecified: Secondary | ICD-10-CM | POA: Diagnosis not present

## 2016-10-11 DIAGNOSIS — R627 Adult failure to thrive: Secondary | ICD-10-CM | POA: Diagnosis not present

## 2016-10-11 DIAGNOSIS — N183 Chronic kidney disease, stage 3 (moderate): Secondary | ICD-10-CM | POA: Diagnosis not present

## 2016-10-11 DIAGNOSIS — F3341 Major depressive disorder, recurrent, in partial remission: Secondary | ICD-10-CM | POA: Diagnosis not present

## 2016-10-11 DIAGNOSIS — M353 Polymyalgia rheumatica: Secondary | ICD-10-CM | POA: Diagnosis not present

## 2016-10-11 DIAGNOSIS — D692 Other nonthrombocytopenic purpura: Secondary | ICD-10-CM | POA: Diagnosis not present

## 2016-10-13 ENCOUNTER — Ambulatory Visit: Payer: Medicare Other | Attending: Internal Medicine | Admitting: Physical Therapy

## 2016-10-13 ENCOUNTER — Encounter: Payer: Self-pay | Admitting: Physical Therapy

## 2016-10-13 DIAGNOSIS — M6281 Muscle weakness (generalized): Secondary | ICD-10-CM

## 2016-10-13 DIAGNOSIS — R2689 Other abnormalities of gait and mobility: Secondary | ICD-10-CM

## 2016-10-13 DIAGNOSIS — R2681 Unsteadiness on feet: Secondary | ICD-10-CM | POA: Diagnosis not present

## 2016-10-14 NOTE — Therapy (Signed)
[facility] [address] [place], [place], [address] Phone: [phone]   Fax:  [phone]  Physical Therapy Treatment  Patient Details  Name: [name] MRN: [id number] Date of Birth: 1934-08-16 Referring Provider: Dr. Shon Baton  Encounter Date: 10/13/2016      PT End of Session - 10/13/16 1024    Visit Number 4   Number of Visits 17   Date for PT Re-Evaluation 10/30/16   Authorization Type Medicare    Authorization Time Period 08-31-16 - 10-30-16   PT Start Time 1020  pt late for apt today   PT Stop Time 1100   PT Time Calculation (min) 40 min   Equipment Utilized During Treatment Gait belt   Activity Tolerance Patient tolerated treatment well   Behavior During Therapy [facility] for tasks assessed/performed      Past Medical History:  Diagnosis Date  . Abnormality of gait   . Arthritis   . Bifascicular bundle branch block    chronic  . Degenerative spinal arthritis    cervical and lumbar  . Dementia    early onset  . Dyspnea on exertion   . GERD (gastroesophageal reflux disease)   . History of CVA (cerebrovascular accident)    03-31-2012--  right cerebellar infart without hemorrhgia  . History of gastric ulcer    2011  w/ bleed  . History of kidney stones   . History of squamous cell carcinoma excision   . Hyperlipidemia   . Hypertension   . Hypothyroidism   . Obstructive sleep apnea    has not been using -- but states has appointment soon to get refitted   . Polymyalgia rheumatica ([place])   . Stroke ([place])   . Type 2 diabetes mellitus ([place])   . Urge urinary incontinence    refactory    Past Surgical History:  Procedure Laterality Date  . APPENDECTOMY  as child  . HERNIA REPAIR  1956  . INTERSTIM IMPLANT PLACEMENT N/A 11/19/2014   Procedure: Barrie Lyme IMPLANT FIRST STAGE AND ;  Surgeon: Bjorn Loser, MD;  Location: [facility];  Service: Urology;  Laterality: N/A;  .  INTERSTIM IMPLANT PLACEMENT N/A 11/19/2014   Procedure: Barrie Lyme IMPLANT SECOND STAGE;  Surgeon: Bjorn Loser, MD;  Location: [facility];  Service: Urology;  Laterality: N/A;  . ORIF RIGHT WRIST FX  03-04-2004  . SHOULDER ARTHROSCOPY / DEBRIDEMENT/  SAD/  DCR/  MINI OPEN ROTATOR CUFF REPAIR Right 01-09-2003  . TEE WITHOUT CARDIOVERSION N/A 09/12/2012   Procedure: TRANSESOPHAGEAL ECHOCARDIOGRAM (TEE);  Surgeon: Lelon Perla, MD;  Location: [facility] ENDOSCOPY;  Service: Cardiovascular;  Laterality: N/A;  proximal septal thickening and chordal SAM creatin a narrow LVOT; turbulence noted  . TONSILLECTOMY  as child  . TOTAL KNEE ARTHROPLASTY Left 11-12-2004  . TRANSTHORACIC ECHOCARDIOGRAM  09-10-2012   mild LVH, mild  focal basal hypertrophy of the septum,  ef 47-65%, grade I diastolic dysfunction/  trival AR /  MV with moderate systolic anterior motion of the chordal structures/  mild LAE    There were no vitals filed for this visit.      Subjective Assessment - 10/13/16 1023    Subjective No new complaints. No falls or pain to report. Does report feeling stiff today. Had a good beach trip, however did not go out onto the sand/beach. Reports only doing the sitting ex's as there was not enough counter space to do the balance ones.    Pertinent  History CVA 3 yrs ago:  several mini strokes per son's report; Type II DM:  polymyalgia rheumatica: spinal arthritis:  early onset dementia:  Dyspnea on exertion   Patient Stated Goals Improve mobility and balance   Currently in Pain? No/denies            [facility] PT Assessment - 10/13/16 1027      Transfers   Transfers Sit to Stand;Stand to Sit   Sit to Stand 5: Supervision;With upper extremity assist;From chair/3-in-1;From bed   Stand to Sit 5: Supervision;With upper extremity assist;To bed;To chair/3-in-1     Ambulation/Gait   Ambulation/Gait Yes   Ambulation/Gait Assistance 5: Supervision;4: Min guard   Ambulation Distance  (Feet) 100 Feet  x2 cane,    Assistive device Straight cane   Gait Pattern Decreased hip/knee flexion - right;Decreased stride length;Decreased step length - left;Decreased step length - right;Shuffle   Ambulation Surface Level;Indoor   Gait velocity 21.0 with rollator: 1.56 ft/sec; 32.09 no AD: 1.02 ft/sec     Standardized Balance Assessment   Standardized Balance Assessment Berg Balance Test;Timed Up and Go Test     Berg Balance Test   Sit to Stand Able to stand without using hands and stabilize independently   Standing Unsupported Able to stand safely 2 minutes   Sitting with Back Unsupported but Feet Supported on Floor or Stool Able to sit safely and securely 2 minutes   Stand to Sit Sits safely with minimal use of hands   Transfers Able to transfer safely, definite need of hands   Standing Unsupported with Eyes Closed Able to stand 10 seconds with supervision   Standing Ubsupported with Feet Together Able to place feet together independently and stand for 1 minute with supervision   From Standing, Reach Forward with Outstretched Arm Can reach forward >5 cm safely (2")  3 inches   From Standing Position, Pick up Object from Floor Able to pick up shoe, needs supervision   From Standing Position, Turn to Look Behind Over each Shoulder Looks behind one side only/other side shows less weight shift  right > left   Turn 360 Degrees Able to turn 360 degrees safely but slowly  > 6 sec's both ways   Standing Unsupported, Alternately Place Feet on Step/Stool Able to complete 4 steps without aid or supervision   Standing Unsupported, One Foot in Front Able to take small step independently and hold 30 seconds   Standing on One Leg Tries to lift leg/unable to hold 3 seconds but remains standing independently   Total Score 40     Timed Up and Go Test   Normal TUG (seconds) 24.47  no AD, 23.37 SPC, 24.03 rollator   TUG Comments trialed multiple devices for pt to see times and assist needed with  each one. pt needed min guard to min assist with no AD/SPC. supervision with rollator.             PT Short Term Goals - 10/13/16 1026      PT SHORT TERM GOAL #1   Title Pt will be independent with HEP to address balance and strength.                   10-01-16 TARGET DATE   Baseline 10/13/16: intermittently doing some of them.    Status Partially Met     PT SHORT TERM GOAL #2   Title Pt will increase gait velocity from 1.37 ft/sec to 1.7 ft/sec with no device (as this  is how assessed at eval per pt's request to perform amb. tests without cane).   10-01-16 TARGET DATE   Baseline 10/13/16: 1.56 ft/sec with rollator, 1.02 ft/sec with no AD. pt has increased gait speed from eval with use of rollator, however without an device her gait speed has declined since eval.    Time --   Period --   Status Partially Met     PT SHORT TERM GOAL #3   Title The patient will improve Berg from 34/56 up to 39/56 to demo decreasing risk for falls.    TARGET DATE 10-01-16   Baseline 04/15/16: pt scored 40/56 today   Time --   Period --   Status Achieved     PT SHORT TERM GOAL #4   Title The patient will decrease TUG score from 26.53 seconds to < or equal to 22 seconds with no device to demo decreasing risk for falls.     10-01-16   Baseline 10/13/16: 24.47 sec's no AD, 23.37 sec's with straight cane (SPC), 24.03 sec's with rollator. pt has decreased TUG with all devices, just not to goal with use of any of these devices.    Time --   Period --   Status Partially Met     PT SHORT TERM GOAL #5   Title Amb. 3" nonstop with RW to demo improved endurance/activity tolerance.   10-01-16   Time 4   Period Weeks   Status On-going              PT Long Term Goals - 09/01/16 2030      PT LONG TERM GOAL #1   Title The patient will improve gait speed from 1.37 ft/sec to >/= 2.0  ft/sec with no device to demo improving mobility.   Target date 10-30-16   Baseline 1.37 ft/sec with no device (pt requested to not  use cane )   Time 8   Period Weeks   Status New     PT LONG TERM GOAL #2   Title The patient will improve Berg from 34/56 up to 42/56 to demo decreasing risk for falls.  10-30-16 TARGET DATE   Baseline 34/56 on 08-31-16   Time 8   Period Weeks   Status New     PT LONG TERM GOAL #3   Title The patient will negotiate 4 steps with one handrail and reciprocal pattern with SBA to demo improved lower extremity strength.  10-30-16   Time 8   Period Weeks   Status New     PT LONG TERM GOAL #4   Title Pt will amb. 400' with rollator on flat even and uneven pavement for incr. community accessibility with SBA.  10-30-16   Time 8   Period Weeks   Status New     PT LONG TERM GOAL #5   Title The patient will decrease TUG score from 26.53 seconds to < or equal to 19 seconds with no device to demo decreasing risk for falls. Target date 10-30-16   Time 8   Period Weeks   Status New        10/13/16 1025  Plan  Clinical Impression Statement today's skilled session focused on checking STGs with 1/5 met, 3/5 partially met and 1/5 remaining to check. Pt appears to be more stable when using a rollator vs cane/no AD. Showed pt results of today's standardized tests that demo'd she has increased gait speed and requires less assistance with use of rollator. Also advised  that use of rollator will decrease her energy expendeture as pt continues to get short of breath easily and needs frequent rest breaks due to this. Pt should benefit from continued PT to progress toward unmet goals.               Pt will benefit from skilled therapeutic intervention in order to improve on the following deficits Abnormal gait;Decreased endurance;Decreased activity tolerance;Decreased balance;Decreased cognition;Decreased strength;Pain;Postural dysfunction  Rehab Potential Good  Clinical Impairments Affecting Rehab Potential length of time since initial CVA (4 years);  early onset dementia; co-morbidities and lack of caregiver support  during the day resulting in sedentary lifestyle  PT Frequency 2x / week  PT Duration 8 weeks  PT Treatment/Interventions ADLs/Self Care Home Management;DME Instruction;Gait training;Stair training;Functional mobility training;Therapeutic activities;Therapeutic exercise;Balance training;Neuromuscular re-education;Patient/family education  PT Next Visit Plan check remaining STG; gait with RW/pt's rollator with emphasis on brake use/safety with gait, standing and dynamic balance   Consulted and Agree with Plan of Care Patient        Patient will benefit from skilled therapeutic intervention in order to improve the following deficits and impairments:  Abnormal gait, Decreased endurance, Decreased activity tolerance, Decreased balance, Decreased cognition, Decreased strength, Pain, Postural dysfunction  Visit Diagnosis: Muscle weakness (generalized)  Unsteadiness on feet  Other abnormalities of gait and mobility     Problem List Patient Active Problem List   Diagnosis Date Noted  . TIA (transient ischemic attack) 09/08/2012  . CVA (cerebral infarction) 04/04/2012  . Cerebellar stroke, acute ([place]) 04/01/2012  . 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 Nausea and vomiting 04/01/2012  . Polymyalgia rheumatica ([place]) 04/01/2012  . Bifascicular block   . Hypertension   . Hyperlipidemia   . Obstructive sleep apnea   . Diabetes mellitus ([place])     Willow Ora, PTA, [facility] [address], [place], [place] [address] [phone] 10/14/16, 6:40 PM   Name: [name] MRN: [id number] Date of Birth: 07-10-1934

## 2016-10-18 ENCOUNTER — Ambulatory Visit: Payer: Medicare Other | Admitting: Physical Therapy

## 2016-10-21 DIAGNOSIS — H349 Unspecified retinal vascular occlusion: Secondary | ICD-10-CM | POA: Diagnosis not present

## 2016-10-21 DIAGNOSIS — H2513 Age-related nuclear cataract, bilateral: Secondary | ICD-10-CM | POA: Diagnosis not present

## 2016-10-26 DIAGNOSIS — E119 Type 2 diabetes mellitus without complications: Secondary | ICD-10-CM | POA: Diagnosis not present

## 2016-10-26 DIAGNOSIS — H43813 Vitreous degeneration, bilateral: Secondary | ICD-10-CM | POA: Diagnosis not present

## 2016-10-26 DIAGNOSIS — H34812 Central retinal vein occlusion, left eye, with macular edema: Secondary | ICD-10-CM | POA: Diagnosis not present

## 2016-10-28 ENCOUNTER — Ambulatory Visit: Payer: Medicare Other | Attending: Internal Medicine | Admitting: Physical Therapy

## 2016-10-28 ENCOUNTER — Encounter: Payer: Self-pay | Admitting: Physical Therapy

## 2016-10-28 DIAGNOSIS — R2681 Unsteadiness on feet: Secondary | ICD-10-CM | POA: Diagnosis not present

## 2016-10-28 DIAGNOSIS — M6281 Muscle weakness (generalized): Secondary | ICD-10-CM

## 2016-10-28 DIAGNOSIS — R2689 Other abnormalities of gait and mobility: Secondary | ICD-10-CM | POA: Insufficient documentation

## 2016-10-31 NOTE — Therapy (Signed)
[facility] [address] [place], [place], [address] Phone: [phone]   Fax:  [phone]  Physical Therapy Treatment  Patient Details  Name: [name] MRN: [id number] Date of Birth: Dec 14, 1934 Referring Provider: Dr. Shon Baton  Encounter Date: 10/28/2016   10/28/16 1622  PT Visits / Re-Eval  Visit Number 5  Number of Visits 17  Date for PT Re-Evaluation 10/30/16  Authorization  Authorization Type Medicare   Authorization Time Period 08-31-16 - 10-30-16  PT Time Calculation  PT Start Time 1617  PT Stop Time 1700  PT Time Calculation (min) 43 min  PT - End of Session  Equipment Utilized During Treatment Gait belt  Activity Tolerance Patient tolerated treatment well  Behavior During Therapy [facility] for tasks assessed/performed     Past Medical History:  Diagnosis Date  . Abnormality of gait   . Arthritis   . Bifascicular bundle branch block    chronic  . Degenerative spinal arthritis    cervical and lumbar  . Dementia    early onset  . Dyspnea on exertion   . GERD (gastroesophageal reflux disease)   . History of CVA (cerebrovascular accident)    03-31-2012--  right cerebellar infart without hemorrhgia  . History of gastric ulcer    2011  w/ bleed  . History of kidney stones   . History of squamous cell carcinoma excision   . Hyperlipidemia   . Hypertension   . Hypothyroidism   . Obstructive sleep apnea    has not been using -- but states has appointment soon to get refitted   . Polymyalgia rheumatica ([place])   . Stroke ([place])   . Type 2 diabetes mellitus ([place])   . Urge urinary incontinence    refactory    Past Surgical History:  Procedure Laterality Date  . APPENDECTOMY  as child  . HERNIA REPAIR  1956  . INTERSTIM IMPLANT PLACEMENT N/A 11/19/2014   Procedure: Barrie Lyme IMPLANT FIRST STAGE AND ;  Surgeon: Bjorn Loser, MD;  [facility];  Service: Urology;   Laterality: N/A;  . INTERSTIM IMPLANT PLACEMENT N/A 11/19/2014   Procedure: Barrie Lyme IMPLANT SECOND STAGE;  Surgeon: Bjorn Loser, MD;  Location[facility];  Service: Urology;  Laterality: N/A;  . ORIF RIGHT WRIST FX  03-04-2004  . SHOULDER ARTHROSCOPY / DEBRIDEMENT/  SAD/  DCR/  MINI OPEN ROTATOR CUFF REPAIR Right 01-09-2003  . TEE WITHOUT CARDIOVERSION N/A 09/12/2012   Procedure: TRANSESOPHAGEAL ECHOCARDIOGRAM (TEE);  Surgeon: Lelon Perla, MD;  Location: [facility] ENDOSCOPY;  Service: Cardiovascular;  Laterality: N/A;  proximal septal thickening and chordal SAM creatin a narrow LVOT; turbulence noted  . TONSILLECTOMY  as child  . TOTAL KNEE ARTHROPLASTY Left 11-12-2004  . TRANSTHORACIC ECHOCARDIOGRAM  09-10-2012   mild LVH, mild  focal basal hypertrophy of the septum,  ef 28-76%, grade I diastolic dysfunction/  trival AR /  MV with moderate systolic anterior motion of the chordal structures/  mild LAE    There were no vitals filed for this visit.     10/28/16 1621  Symptoms/Limitations  Subjective No new complaints. No falls per pt report. To clinic with rollator today. Does report not doing HEP at home. Reports she "forgot" last week.   Patient is accompained by: Family member (son in lobby)  Pertinent History CVA 3 yrs ago:  several mini strokes per son's report; Type II DM:  polymyalgia rheumatica: spinal arthritis:  early onset dementia:  Dyspnea on exertion  Patient Stated Goals Improve mobility and balance  Pain Assessment  Currently in Pain? Yes  Pain Score 3  Pain Location Knee  Pain Orientation Right  Pain Descriptors / Indicators Throbbing  Pain Type Chronic pain  Pain Onset More than a month ago  Pain Frequency Constant  Aggravating Factors  walking on it  Pain Relieving Factors asprin,      10/28/16 1628  Ambulation/Gait  Ambulation/Gait Yes  Ambulation/Gait Assistance 5: Supervision;4: Min guard  Ambulation/Gait Assistance Details cues for  increased step length and upright posture with gait  Ambulation Distance (Feet) 210 Feet (w/cane; 325 w/rollator (> 3 mintues))  Assistive device Straight cane;Rollator  Gait Pattern Step-through pattern;Decreased stride length;Decreased step length - right;Decreased step length - left;Shuffle;Narrow base of support  Ambulation Surface Level;Indoor  Gait velocity 21.25 sec with cane (pt mostly carries it vs using it)= 1.53 ft/sed  Stairs Yes  Stairs Assistance 4: Min guard  Stairs Assistance Details (indicate cue type and reason) increase time needed. cues to adavance hands on rails to assist with weight shifitng  up and down stairs  Stair Management Technique Two rails;Step to pattern;Forwards  Number of Stairs 4  Height of Stairs 6  Berg Balance Test  Sit to Stand 4  Standing Unsupported 4  Sitting with Back Unsupported but Feet Supported on Floor or Stool 4  Stand to Sit 4  Transfers 4  Standing Unsupported with Eyes Closed 4  Standing Ubsupported with Feet Together 3  From Standing, Reach Forward with Outstretched Arm 3 (8 inches)  From Standing Position, Pick up Object from Floor 4  From Standing Position, Turn to Look Behind Over each Shoulder 3 (right > left)  Turn 360 Degrees 2 (~4.31 sec's each way)  Standing Unsupported, Alternately Place Feet on Step/Stool 2  Standing Unsupported, One Foot in Front 3  Standing on One Leg 1  Total Score 45  Timed Up and Go Test  Normal TUG (seconds) 22.78 (with cane; 20.34 no AD)           PT Short Term Goals - 10/28/16 1625      PT SHORT TERM GOAL #1   Title Pt will be independent with HEP to address balance and strength.                   10-01-16 TARGET DATE   Baseline 10/13/16: intermittently doing some of them.    Status Partially Met     PT SHORT TERM GOAL #2   Title Pt will increase gait velocity from 1.37 ft/sec to 1.7 ft/sec with no device (as this is how assessed at eval per pt's request to perform amb. tests  without cane).   10-01-16 TARGET DATE   Baseline 10/13/16: 1.56 ft/sec with rollator, 1.02 ft/sec with no AD. pt has increased gait speed from eval with use of rollator, however without an device her gait speed has declined since eval.    Status Partially Met     PT SHORT TERM GOAL #3   Title The patient will improve Berg from 34/56 up to 39/56 to demo decreasing risk for falls.    TARGET DATE 10-01-16   Baseline 04/15/16: pt scored 40/56 today   Status Achieved     PT SHORT TERM GOAL #4   Title The patient will decrease TUG score from 26.53 seconds to < or equal to 22 seconds with no device to demo decreasing risk for falls.     10-01-16  Baseline 10/13/16: 24.47 sec's no AD, 23.37 sec's with straight cane (SPC), 24.03 sec's with rollator. pt has decreased TUG with all devices, just not to goal with use of any of these devices.    Status Partially Met     PT SHORT TERM GOAL #5   Title Amb. 3" nonstop with RW to demo improved endurance/activity tolerance.   10-01-16   Baseline 10/28/16: met today   Time --   Period --   Status Achieved           PT Long Term Goals - 10/28/16 1626      PT LONG TERM GOAL #1   Title The patient will improve gait speed from 1.37 ft/sec to >/= 2.0  ft/sec with no device to demo improving mobility.   Target date 10-30-16   Baseline 10/28/16: 1.53 ft/sec with pt carring cane vs using it, improved just not to goal   Time --   Period --   Status Partially Met     PT LONG TERM GOAL #2   Title The patient will improve Berg from 34/56 up to 42/56 to demo decreasing risk for falls.  10-30-16 TARGET DATE   Baseline 10/28/16: 45/56 today   Time --   Period --   Status Achieved     PT LONG TERM GOAL #3   Title The patient will negotiate 4 steps with one handrail and reciprocal pattern with SBA to demo improved lower extremity strength.  10-30-16   Baseline 10/28/16: continues to need 2 rails, step to pattern   Time --   Period --   Status Not Met     PT LONG TERM GOAL  #4   Title Pt will amb. 400' with rollator on flat even and uneven pavement for incr. community accessibility with SBA.  10-30-16   Baseline 10/28/16: pt able to go 325 feet with rollator, improved just not to goal   Time --   Period --   Status Partially Met     PT LONG TERM GOAL #5   Title The patient will decrease TUG score from 26.53 seconds to < or equal to 19 seconds with no device to demo decreasing risk for falls. Target date 10-30-16   Baseline 10/28/16: 22.78 sec's with cane; 20.34 sec's no AD, both improved just not to goals   Time --   Period --   Status Partially Met        10/28/16 1623  Plan  Clinical Impression Statement Today's skilled session focused on cheking progress toward goals for PT to do renewal at next visit. Pt did meet some goals and demo'd progress toward other goals not met. Pt should benefit from continued PT to progress toward unmet goals/goals of renewal.   Pt will benefit from skilled therapeutic intervention in order to improve on the following deficits Abnormal gait;Decreased endurance;Decreased activity tolerance;Decreased balance;Decreased cognition;Decreased strength;Pain;Postural dysfunction  Rehab Potential Good  Clinical Impairments Affecting Rehab Potential length of time since initial CVA (4 years);  early onset dementia; co-morbidities and lack of caregiver support during the day resulting in sedentary lifestyle  PT Frequency 2x / week  PT Duration 8 weeks  PT Treatment/Interventions ADLs/Self Care Home Management;DME Instruction;Gait training;Stair training;Functional mobility training;Therapeutic activities;Therapeutic exercise;Balance training;Neuromuscular re-education;Patient/family education  PT Next Visit Plan PT to renew. continue to work on balance and LE strengthening, gait with cane vs no AD  Consulted and Agree with Plan of Care Patient       Patient will benefit  from skilled therapeutic intervention in order to improve the following  deficits and impairments:  Abnormal gait, Decreased endurance, Decreased activity tolerance, Decreased balance, Decreased cognition, Decreased strength, Pain, Postural dysfunction  Visit Diagnosis: Muscle weakness (generalized)  Unsteadiness on feet  Other abnormalities of gait and mobility     Problem List Patient Active Problem List   Diagnosis Date Noted  . TIA (transient ischemic attack) 09/08/2012  . CVA (cerebral infarction) 04/04/2012  . Cerebellar stroke, acute ([place]) 04/01/2012  . 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 Nausea and vomiting 04/01/2012  . Polymyalgia rheumatica ([place]) 04/01/2012  . Bifascicular block   . Hypertension   . Hyperlipidemia   . Obstructive sleep apnea   . Diabetes mellitus ([place])     Willow Ora, PTA, [facility] [address] [place], [place] [address] [phone] 10/31/16, 11:13 PM   Name: MANDIE CRABBE MRN: [id number] Date of Birth: 10-02-1934

## 2016-11-01 ENCOUNTER — Ambulatory Visit: Payer: Medicare Other | Admitting: Physical Therapy

## 2016-11-04 ENCOUNTER — Ambulatory Visit: Payer: Medicare Other | Admitting: Physical Therapy

## 2016-11-08 ENCOUNTER — Ambulatory Visit: Payer: Medicare Other | Admitting: Physical Therapy

## 2016-11-10 DIAGNOSIS — N3946 Mixed incontinence: Secondary | ICD-10-CM | POA: Diagnosis not present

## 2016-11-10 DIAGNOSIS — R351 Nocturia: Secondary | ICD-10-CM | POA: Diagnosis not present

## 2016-11-12 ENCOUNTER — Ambulatory Visit: Payer: Medicare Other | Admitting: Physical Therapy

## 2016-11-15 ENCOUNTER — Ambulatory Visit: Payer: Medicare Other | Admitting: Physical Therapy

## 2016-11-15 ENCOUNTER — Encounter: Payer: Self-pay | Admitting: Physical Therapy

## 2016-11-15 DIAGNOSIS — R2681 Unsteadiness on feet: Secondary | ICD-10-CM | POA: Diagnosis not present

## 2016-11-15 DIAGNOSIS — R2689 Other abnormalities of gait and mobility: Secondary | ICD-10-CM | POA: Diagnosis not present

## 2016-11-15 DIAGNOSIS — H0012 Chalazion right lower eyelid: Secondary | ICD-10-CM | POA: Diagnosis not present

## 2016-11-15 DIAGNOSIS — M6281 Muscle weakness (generalized): Secondary | ICD-10-CM | POA: Diagnosis not present

## 2016-11-16 ENCOUNTER — Ambulatory Visit: Payer: Medicare Other | Admitting: Physical Therapy

## 2016-11-16 ENCOUNTER — Encounter: Payer: Self-pay | Admitting: Physical Therapy

## 2016-11-16 DIAGNOSIS — R2689 Other abnormalities of gait and mobility: Secondary | ICD-10-CM | POA: Diagnosis not present

## 2016-11-16 DIAGNOSIS — M6281 Muscle weakness (generalized): Secondary | ICD-10-CM | POA: Diagnosis not present

## 2016-11-16 DIAGNOSIS — R2681 Unsteadiness on feet: Secondary | ICD-10-CM

## 2016-11-16 NOTE — Therapy (Signed)
[facility] [address] [place], [place], [address] Phone: [phone]   Fax:  [phone]  Physical Therapy Treatment  Patient Details  Name: [name] MRN: [id number] Date of Birth: 27-Dec-1934 Referring Provider: Dr. Shon Baton  Encounter Date: 11/15/2016      PT End of Session - 11/16/16 0917    Visit Number 6   Number of Visits 25  updated 9/24   Date for PT Re-Evaluation 12/16/16   Authorization Type Medicare    Authorization Time Period 9-38-10 - 02-28-49; recertified 0/25/85 to 01/15/17   PT Start Time 1325   PT Stop Time 1357   PT Time Calculation (min) 32 min   Equipment Utilized During Treatment Gait belt   Activity Tolerance Patient tolerated treatment well   Behavior During Therapy [facility] for tasks assessed/performed      Past Medical History:  Diagnosis Date  . Abnormality of gait   . Arthritis   . Bifascicular bundle branch block    chronic  . Degenerative spinal arthritis    cervical and lumbar  . Dementia    early onset  . Dyspnea on exertion   . GERD (gastroesophageal reflux disease)   . History of CVA (cerebrovascular accident)    03-31-2012--  right cerebellar infart without hemorrhgia  . History of gastric ulcer    2011  w/ bleed  . History of kidney stones   . History of squamous cell carcinoma excision   . Hyperlipidemia   . Hypertension   . Hypothyroidism   . Obstructive sleep apnea    has not been using -- but states has appointment soon to get refitted   . Polymyalgia rheumatica ([place])   . Stroke ([place])   . Type 2 diabetes mellitus ([place])   . Urge urinary incontinence    refactory    Past Surgical History:  Procedure Laterality Date  . APPENDECTOMY  as child  . HERNIA REPAIR  1956  . INTERSTIM IMPLANT PLACEMENT N/A 11/19/2014   Procedure: Barrie Lyme IMPLANT FIRST STAGE AND ;  Surgeon: Bjorn Loser, MD;  Location[facility];  Service: Urology;   Laterality: N/A;  . INTERSTIM IMPLANT PLACEMENT N/A 11/19/2014   Procedure: Barrie Lyme IMPLANT SECOND STAGE;  Surgeon: Bjorn Loser, MD;  Location[facility];  Service: Urology;  Laterality: N/A;  . ORIF RIGHT WRIST FX  03-04-2004  . SHOULDER ARTHROSCOPY / DEBRIDEMENT/  SAD/  DCR/  MINI OPEN ROTATOR CUFF REPAIR Right 01-09-2003  . TEE WITHOUT CARDIOVERSION N/A 09/12/2012   Procedure: TRANSESOPHAGEAL ECHOCARDIOGRAM (TEE);  Surgeon: Lelon Perla, MD;  Location: [facility] ENDOSCOPY;  Service: Cardiovascular;  Laterality: N/A;  proximal septal thickening and chordal SAM creatin a narrow LVOT; turbulence noted  . TONSILLECTOMY  as child  . TOTAL KNEE ARTHROPLASTY Left 11-12-2004  . TRANSTHORACIC ECHOCARDIOGRAM  09-10-2012   mild LVH, mild  focal basal hypertrophy of the septum,  ef 27-78%, grade I diastolic dysfunction/  trival AR /  MV with moderate systolic anterior motion of the chordal structures/  mild LAE    There were no vitals filed for this visit.      Subjective Assessment - 11/15/16 1326    Subjective Patient reports her son got upset with her always being late to get ready to come to therapy and called last week and cancelled all her appointments. She then called and rescheduled them.    Patient is accompained by: Family member  son in lobby  Pertinent History CVA 3 yrs ago:  several mini strokes per son's report; Type II DM:  polymyalgia rheumatica: spinal arthritis:  early onset dementia:  Dyspnea on exertion   Limitations Lifting   Patient Stated Goals Improve mobility and balance   Currently in Pain? Yes   Pain Score 4    Pain Location Knee   Pain Orientation Right   Pain Descriptors / Indicators Aching   Pain Type Chronic pain   Pain Onset More than a month ago   Pain Frequency Constant                         [facility] Adult PT Treatment/Exercise - 11/16/16 0001      Transfers   Transfers Sit to Stand;Stand to Sit   Sit to Stand 5:  Supervision;With upper extremity assist;4: Min assist;Without upper extremity assist;With armrests;From chair/3-in-1   Sit to Stand Details (indicate cue type and reason) no UEs required min assist and vc for technique; with UEs required supervision for safety due to imbalnce   Stand to Sit 4: Min guard   Number of Reps --  5     Ambulation/Gait   Ambulation/Gait Yes   Ambulation/Gait Assistance 5: Supervision;4: Min guard   Ambulation/Gait Assistance Details cues for upright posture, correct use of DME, incr step length   Ambulation Distance (Feet) 120 Feet  x2   Assistive device Straight cane;Rollator   Gait Pattern Step-through pattern;Decreased step length - right;Decreased step length - left;Shuffle;Trunk flexed   Ambulation Surface Level             Balance Exercises - 11/15/16 1342      OTAGO PROGRAM   Head Movements Standing;5 reps   Neck Movements Sitting;5 reps   Trunk Movements Standing;5 reps   Ankle Movements Sitting;10 reps   Knee Extensor 10 reps   Knee Flexor 5 reps   Hip ABductor 10 reps   Ankle Plantorflexors 20 reps, support   Ankle Dorsiflexors 20 reps, support   Backwards Walking Support   Sideways Walking Assistive device   Sit to Stand 5 reps, bilateral support           PT Education - 11/16/16 0916    Education provided Yes   Education Details Continue PT POC x 4 weeks due to missed appointments with pt having made some progress towards goals.    Person(s) Educated Patient   Methods Explanation   Comprehension Verbalized understanding          PT Short Term Goals - 11/16/16 1644      PT SHORT TERM GOAL #1   Title see note 10/01/16           PT Long Term Goals - 11/16/16 1625      PT LONG TERM GOAL #1   Title The patient will improve gait speed from 1.37 ft/sec to >/= 1.65  ft/sec with rollator to demo improving mobility and lesser fall risk.   Target date 10-30-16 (NEW Target date for all LTGs 12-16-16)   Baseline 10/28/16: 1.53  ft/sec with pt carring cane vs using it, improved just not to goal; 9/24 update 1.56 ft/sec with rollator   Time 4   Period Weeks   Status Revised   Target Date 12/16/16     PT LONG TERM GOAL #2   Title achieved 9/8     PT LONG TERM GOAL #3   Title The patient will negotiate 4 steps with one handrail  and step-to pattern with SBA to demo improved lower extremity strength.  10-30-16   Baseline 10/28/16: continues to need 2 rails, step to pattern   Status Revised     PT LONG TERM GOAL #4   Title Pt will amb. 400' with rollator on flat even and uneven pavement for incr. community accessibility with SBA.  10-30-16   Baseline 10/28/16: pt able to go 325 feet with rollator, improved just not to goal   Status On-going     PT LONG TERM GOAL #5   Title The patient will decrease TUG score from 26.53 seconds to < or equal to 19 seconds with no device to demo decreasing risk for falls. Target date 10-30-16   Baseline 10/28/16: 22.78 sec's with cane; 20.34 sec's no AD, both improved just not to goals   Status On-going               Plan - 12/05/2016 7939    Clinical Impression Statement Shortened session due to pateint arriving late for session. Discussed importance of attendance for pt to get most benefit from course of PT. Patient has met some STGs and made progress towards others and wishes to continue PT with committment to attend sessions on time. See updated POC.    Rehab Potential Good   Clinical Impairments Affecting Rehab Potential length of time since initial CVA (4 years);  early onset dementia; co-morbidities and lack of caregiver support during the day resulting in sedentary lifestyle   PT Frequency 2x / week   PT Duration 4 weeks  updated December 05, 2016   PT Treatment/Interventions ADLs/Self Care Home Management;DME Instruction;Gait training;Stair training;Functional mobility training;Therapeutic activities;Therapeutic exercise;Balance training;Neuromuscular re-education;Patient/family education    PT Next Visit Plan continue to work on balance and LE strengthening, gait with rollator   Consulted and Agree with Plan of Care Patient      Patient will benefit from skilled therapeutic intervention in order to improve the following deficits and impairments:  Abnormal gait, Decreased endurance, Decreased activity tolerance, Decreased balance, Decreased cognition, Decreased strength, Pain, Postural dysfunction, Decreased safety awareness  Visit Diagnosis: Muscle weakness (generalized)  Unsteadiness on feet  Other abnormalities of gait and mobility       G-Codes - 12/05/16 1635    Functional Assessment Tool Used (Outpatient Only) Berg score 45/56:  TUg score 24.03 secs with rollator: Gait velocity 1.56 ft/sec with rollator   Functional Limitation Mobility: Walking and moving around   Mobility: Walking and Moving Around Current Status [phone]) At least 60 percent but less than 80 percent impaired, limited or restricted   Mobility: Walking and Moving Around Goal Status [phone]) At least 40 percent but less than 60 percent impaired, limited or restricted      Problem List Patient Active Problem List   Diagnosis Date Noted  . TIA (transient ischemic attack) 09/08/2012  . CVA (cerebral infarction) 04/04/2012  . Cerebellar stroke, acute ([place]) 04/01/2012  . 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 Nausea and vomiting 04/01/2012  . Polymyalgia rheumatica ([place]) 04/01/2012  . Bifascicular block   . Hypertension   . Hyperlipidemia   . Obstructive sleep apnea   . Diabetes mellitus [facility])    Physical Therapy Progress Note  Dates of Reporting Period: 08/31/16 to 11/15/16  Objective Reports of Subjective Statement: Patient feels her balance is improving despite multiple missed appointments.  Objective Measurements: see in goals above  Goal Update: see above   Plan: complete 2x/week for next 4 weeks  Reason Skilled Services are  Required: to improve balance and decrease  fall risk    Rexanne Mano, PT 11/16/2016, 4:48 PM  [facility] [address] [place], [place], [address] Phone: [phone]   Fax:  [phone]  Name: [name] MRN: [id number] Date of Birth: 1934-03-19

## 2016-11-18 ENCOUNTER — Ambulatory Visit: Payer: Medicare Other | Admitting: Physical Therapy

## 2016-11-18 NOTE — Therapy (Signed)
[facility] [address] [place], [place], [address] Phone: [phone]   Fax:  [phone]  Physical Therapy Treatment  Patient Details  Name: [name] MRN: [id number] Date of Birth: 1934/11/17 Referring Provider: Dr. Shon Baton  Encounter Date: 11/16/2016   11/16/16 1651  PT Visits / Re-Eval  Visit Number 7  Number of Visits 25 (updated 9/24)  Date for PT Re-Evaluation 12/16/16  Authorization  Authorization Type Medicare   Authorization Time Period 6-96-29 - 06-24-82; recertified 1/32/44 to 01/15/17  PT Time Calculation  PT Start Time 1618  PT Stop Time 1658  PT Time Calculation (min) 40 min  PT - End of Session  Equipment Utilized During Treatment Gait belt  Activity Tolerance Patient tolerated treatment well  Behavior During Therapy [facility] for tasks assessed/performed     Past Medical History:  Diagnosis Date  . Abnormality of gait   . Arthritis   . Bifascicular bundle branch block    chronic  . Degenerative spinal arthritis    cervical and lumbar  . Dementia    early onset  . Dyspnea on exertion   . GERD (gastroesophageal reflux disease)   . History of CVA (cerebrovascular accident)    03-31-2012--  right cerebellar infart without hemorrhgia  . History of gastric ulcer    2011  w/ bleed  . History of kidney stones   . History of squamous cell carcinoma excision   . Hyperlipidemia   . Hypertension   . Hypothyroidism   . Obstructive sleep apnea    has not been using -- but states has appointment soon to get refitted   . Polymyalgia rheumatica ([place])   . Stroke ([place])   . Type 2 diabetes mellitus ([place])   . Urge urinary incontinence    refactory    Past Surgical History:  Procedure Laterality Date  . APPENDECTOMY  as child  . HERNIA REPAIR  1956  . INTERSTIM IMPLANT PLACEMENT N/A 11/19/2014   Procedure: Barrie Lyme IMPLANT FIRST STAGE AND ;  Surgeon: Bjorn Loser, MD;  Location: [facility];  Service: Urology;  Laterality: N/A;  . INTERSTIM IMPLANT PLACEMENT N/A 11/19/2014   Procedure: Barrie Lyme IMPLANT SECOND STAGE;  Surgeon: Bjorn Loser, MD;  [facility];  Service: Urology;  Laterality: N/A;  . ORIF RIGHT WRIST FX  03-04-2004  . SHOULDER ARTHROSCOPY / DEBRIDEMENT/  SAD/  DCR/  MINI OPEN ROTATOR CUFF REPAIR Right 01-09-2003  . TEE WITHOUT CARDIOVERSION N/A 09/12/2012   Procedure: TRANSESOPHAGEAL ECHOCARDIOGRAM (TEE);  Surgeon: Lelon Perla, MD;  Location: [facility] ENDOSCOPY;  Service: Cardiovascular;  Laterality: N/A;  proximal septal thickening and chordal SAM creatin a narrow LVOT; turbulence noted  . TONSILLECTOMY  as child  . TOTAL KNEE ARTHROPLASTY Left 11-12-2004  . TRANSTHORACIC ECHOCARDIOGRAM  09-10-2012   mild LVH, mild  focal basal hypertrophy of the septum,  ef 01-02%, grade I diastolic dysfunction/  trival AR /  MV with moderate systolic anterior motion of the chordal structures/  mild LAE    There were no vitals filed for this visit.     11/16/16 1649  Symptoms/Limitations  Subjective No new complaints except for a stye on her left eye. Got drops for it this am. No fall to report or changes since yesterday.   Patient is accompained by: Family member (son in lobby)  Pertinent History CVA 3 yrs ago:  several mini strokes per son's report; Type II DM:  polymyalgia rheumatica: spinal  arthritis:  early onset dementia:  Dyspnea on exertion  Limitations Lifting  Patient Stated Goals Improve mobility and balance  Pain Assessment  Currently in Pain? Yes  Pain Score 3  Pain Location Knee  Pain Orientation Right  Pain Descriptors / Indicators Aching  Pain Type Chronic pain  Pain Onset More than a month ago  Pain Frequency Constant  Aggravating Factors  increaesed activity, walking on it  Pain Relieving Factors nothing except for shots which she has not had in a while      11/16/16 1654  Transfers  Transfers Sit to  Stand;Stand to Sit  Sit to Stand 5: Supervision;With upper extremity assist;4: Min assist;Without upper extremity assist;With armrests;From chair/3-in-1  Stand to Sit 5: Supervision;With upper extremity assist;To bed;To chair/3-in-1  Ambulation/Gait  Ambulation/Gait Yes  Ambulation/Gait Assistance 5: Supervision;4: Min guard  Ambulation/Gait Assistance Details cues for hand position on brakes and for proximity to rollator with gait.   Ambulation Distance (Feet) 350 Feet (x1 indoors, 200 x2 outdoors)  Assistive device Rollator  Gait Pattern Step-through pattern;Decreased step length - right;Decreased step length - left;Shuffle;Trunk flexed  Ambulation Surface Level;Indoor;Unlevel;Outdoor;Paved  Knee/Hip Exercises: Standing  Heel Raises Both;1 set;10 reps;Limitations  Heel Raises Limitations UE support at counter for balance, cues to slow down for more controlled motions  Hip Abduction AROM;Stengthening;Both;1 set;10 reps;Knee straight;Limitations  Abduction Limitations UE support at counter top, alternating legs with cues on correct ex form/technique.,  Forward Step Up Both;1 set;10 reps;Hand Hold: 2;Step Height: 6";Limitations  Forward Step Up Limitations cues on form and technique, cues to use arms notes           PT Short Term Goals - 11/16/16 1644      PT SHORT TERM GOAL #1   Title see note 10/01/16           PT Long Term Goals - 11/16/16 1625      PT LONG TERM GOAL #1   Title The patient will improve gait speed from 1.37 ft/sec to >/= 1.65  ft/sec with rollator to demo improving mobility and lesser fall risk.   Target date 10-30-16 (NEW Target date for all LTGs 12-16-16)   Baseline 10/28/16: 1.53 ft/sec with pt carring cane vs using it, improved just not to goal; 9/24 update 1.56 ft/sec with rollator   Time 4   Period Weeks   Status Revised   Target Date 12/16/16     PT LONG TERM GOAL #2   Title achieved 9/8     PT LONG TERM GOAL #3   Title The patient will negotiate  4 steps with one handrail and step-to pattern with SBA to demo improved lower extremity strength.  10-30-16   Baseline 10/28/16: continues to need 2 rails, step to pattern   Status Revised     PT LONG TERM GOAL #4   Title Pt will amb. 400' with rollator on flat even and uneven pavement for incr. community accessibility with SBA.  10-30-16   Baseline 10/28/16: pt able to go 325 feet with rollator, improved just not to goal   Status On-going     PT LONG TERM GOAL #5   Title The patient will decrease TUG score from 26.53 seconds to < or equal to 19 seconds with no device to demo decreasing risk for falls. Target date 10-30-16   Baseline 10/28/16: 22.78 sec's with cane; 20.34 sec's no AD, both improved just not to goals   Status On-going  11/16/16 1652  Plan  Clinical Impression Statement Today's skilled session continued to address safety/activity tolerance with rollator and LE strengthening. Pt is progressing toward goals and should benefit from continued PT to progress toward unmet goals.   Pt will benefit from skilled therapeutic intervention in order to improve on the following deficits Abnormal gait;Decreased endurance;Decreased activity tolerance;Decreased balance;Decreased cognition;Decreased strength;Pain;Postural dysfunction;Decreased safety awareness  Rehab Potential Good  Clinical Impairments Affecting Rehab Potential length of time since initial CVA (4 years);  early onset dementia; co-morbidities and lack of caregiver support during the day resulting in sedentary lifestyle  PT Frequency 2x / week  PT Duration 4 weeks (updated 11/16/16)  PT Treatment/Interventions ADLs/Self Care Home Management;DME Instruction;Gait training;Stair training;Functional mobility training;Therapeutic activities;Therapeutic exercise;Balance training;Neuromuscular re-education;Patient/family education  PT Next Visit Plan continue to work on balance and LE strengthening, gait with rollator  Consulted and Agree  with Plan of Care Patient       Patient will benefit from skilled therapeutic intervention in order to improve the following deficits and impairments:  Abnormal gait, Decreased endurance, Decreased activity tolerance, Decreased balance, Decreased cognition, Decreased strength, Pain, Postural dysfunction, Decreased safety awareness  Visit Diagnosis: Muscle weakness (generalized)  Unsteadiness on feet  Other abnormalities of gait and mobility     Problem List Patient Active Problem List   Diagnosis Date Noted  . TIA (transient ischemic attack) 09/08/2012  . CVA (cerebral infarction) 04/04/2012  . Cerebellar stroke, acute ([place]) 04/01/2012  . 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 Nausea and vomiting 04/01/2012  . Polymyalgia rheumatica ([place]) 04/01/2012  . Bifascicular block   . Hypertension   . Hyperlipidemia   . Obstructive sleep apnea   . Diabetes mellitus ([place])     Willow Ora, PTA, [facility] [address] [place], [place] [address] [phone] 11/18/16, 1:54 PM   Name: [name] MRN: [id number] Date of Birth: 07/23/1934

## 2016-11-22 ENCOUNTER — Ambulatory Visit: Payer: Medicare Other | Attending: Internal Medicine | Admitting: Physical Therapy

## 2016-11-22 ENCOUNTER — Ambulatory Visit: Payer: Medicare Other | Admitting: Physical Therapy

## 2016-11-22 ENCOUNTER — Encounter: Payer: Self-pay | Admitting: Physical Therapy

## 2016-11-22 DIAGNOSIS — M6281 Muscle weakness (generalized): Secondary | ICD-10-CM

## 2016-11-22 DIAGNOSIS — R2689 Other abnormalities of gait and mobility: Secondary | ICD-10-CM | POA: Diagnosis not present

## 2016-11-22 DIAGNOSIS — R2681 Unsteadiness on feet: Secondary | ICD-10-CM | POA: Diagnosis not present

## 2016-11-23 DIAGNOSIS — H0011 Chalazion right upper eyelid: Secondary | ICD-10-CM | POA: Diagnosis not present

## 2016-11-23 NOTE — Therapy (Signed)
[facility] [address] [place], [place], [address] Phone: [phone]   Fax:  [phone]  Physical Therapy Treatment  Patient Details  Name: [name] MRN: [id number] Date of Birth: 01-17-1935 Referring Provider: Dr. Shon Baton  Encounter Date: 11/22/2016      PT End of Session - 11/22/16 1625    Visit Number 8   Number of Visits 25   Date for PT Re-Evaluation 12/16/16   Authorization Type Medicare    Authorization Time Period 0-62-37 - 07-24-81; recertified 1/51/76 to 01/15/17   PT Start Time 1619   PT Stop Time 1700   PT Time Calculation (min) 41 min   Equipment Utilized During Treatment Gait belt   Activity Tolerance Patient tolerated treatment well   Behavior During Therapy [facility] for tasks assessed/performed      Past Medical History:  Diagnosis Date  . Abnormality of gait   . Arthritis   . Bifascicular bundle branch block    chronic  . Degenerative spinal arthritis    cervical and lumbar  . Dementia    early onset  . Dyspnea on exertion   . GERD (gastroesophageal reflux disease)   . History of CVA (cerebrovascular accident)    03-31-2012--  right cerebellar infart without hemorrhgia  . History of gastric ulcer    2011  w/ bleed  . History of kidney stones   . History of squamous cell carcinoma excision   . Hyperlipidemia   . Hypertension   . Hypothyroidism   . Obstructive sleep apnea    has not been using -- but states has appointment soon to get refitted   . Polymyalgia rheumatica ([place])   . Stroke ([place])   . Type 2 diabetes mellitus ([place])   . Urge urinary incontinence    refactory    Past Surgical History:  Procedure Laterality Date  . APPENDECTOMY  as child  . HERNIA REPAIR  1956  . INTERSTIM IMPLANT PLACEMENT N/A 11/19/2014   Procedure: Barrie Lyme IMPLANT FIRST STAGE AND ;  Surgeon: Bjorn Loser, MD;  Location: [facility];  Service: Urology;  Laterality: N/A;  .  INTERSTIM IMPLANT PLACEMENT N/A 11/19/2014   Procedure: Barrie Lyme IMPLANT SECOND STAGE;  Surgeon: Bjorn Loser, MD;  Location: [facility];  Service: Urology;  Laterality: N/A;  . ORIF RIGHT WRIST FX  03-04-2004  . SHOULDER ARTHROSCOPY / DEBRIDEMENT/  SAD/  DCR/  MINI OPEN ROTATOR CUFF REPAIR Right 01-09-2003  . TEE WITHOUT CARDIOVERSION N/A 09/12/2012   Procedure: TRANSESOPHAGEAL ECHOCARDIOGRAM (TEE);  Surgeon: Lelon Perla, MD;  Location: [facility] ENDOSCOPY;  Service: Cardiovascular;  Laterality: N/A;  proximal septal thickening and chordal SAM creatin a narrow LVOT; turbulence noted  . TONSILLECTOMY  as child  . TOTAL KNEE ARTHROPLASTY Left 11-12-2004  . TRANSTHORACIC ECHOCARDIOGRAM  09-10-2012   mild LVH, mild  focal basal hypertrophy of the septum,  ef 16-07%, grade I diastolic dysfunction/  trival AR /  MV with moderate systolic anterior motion of the chordal structures/  mild LAE    There were no vitals filed for this visit.      Subjective Assessment - 11/22/16 1623    Subjective Still with stye on her eye that she feels is getting worse, using the drops as she should. No falls.    Patient is accompained by: Family member  son in lobby   Pertinent History CVA 3 yrs ago:  several mini strokes per son's report; Type II DM:  polymyalgia rheumatica: spinal arthritis:  early onset dementia:  Dyspnea on exertion   Limitations Lifting   Patient Stated Goals Improve mobility and balance   Currently in Pain? Yes   Pain Score 4    Pain Location Knee   Pain Orientation Right   Pain Descriptors / Indicators Aching;Sore;Discomfort   Pain Type Chronic pain   Pain Onset More than a month ago   Pain Frequency Constant   Aggravating Factors  increased activity, walking on it   Pain Relieving Factors nothing except for shots which she has not had in a while, took tylenol today with no help            [facility] Adult PT Treatment/Exercise - 11/22/16 1627      Transfers    Transfers Sit to Stand;Stand to Sit   Sit to Stand 5: Supervision;With upper extremity assist;4: Min assist;Without upper extremity assist;With armrests;From chair/3-in-1   Stand to Sit 5: Supervision;With upper extremity assist;To bed;To chair/3-in-1     Ambulation/Gait   Ambulation/Gait Yes   Ambulation/Gait Assistance 5: Supervision;4: Min guard   Ambulation/Gait Assistance Details mod cues for increased foot clearance with stepping, increased step length and for posture.    Ambulation Distance (Feet) 350 Feet  x1 in/outdoors, + around gym   Assistive device Rollator   Gait Pattern Step-through pattern;Decreased step length - right;Decreased step length - left;Shuffle;Trunk flexed   Ambulation Surface Level;Unlevel;Indoor;Outdoor;Paved   Curb 4: Min assist   Curb Details (indicate cue type and reason) x1 on outdoor curb with rollator, cues on sequencing and technique.                      Knee/Hip Exercises: Standing   Heel Raises Both;1 set;10 reps;5 seconds;Limitations   Heel Raises Limitations UE support on locked rollator with cues for slow, controlled movements   Lateral Step Up Both;1 set;10 reps;Hand Hold: 2;Step Height: 6";Limitations   Lateral Step Up Limitations cues on form/technique, heavy reliance on UE's on rail   Forward Step Up Both;1 set;10 reps;Step Height: 6";Limitations;Hand Hold: 2   Forward Step Up Limitations cues on form and technique, cues to decr UE use on rails    Other Standing Knee Exercises sit<>stands with simultaneous CP using 5# ball: 5 reps, 2 sets. min guard assist with cues for ant weight shiting and slow, controlled sitting down              PT Short Term Goals - 11/16/16 1644      PT SHORT TERM GOAL #1   Title see note 10/01/16           PT Long Term Goals - 11/16/16 1625      PT LONG TERM GOAL #1   Title The patient will improve gait speed from 1.37 ft/sec to >/= 1.65  ft/sec with rollator to demo improving mobility and lesser fall  risk.   Target date 10-30-16 (NEW Target date for all LTGs 12-16-16)   Baseline 10/28/16: 1.53 ft/sec with pt carring cane vs using it, improved just not to goal; 9/24 update 1.56 ft/sec with rollator   Time 4   Period Weeks   Status Revised   Target Date 12/16/16     PT LONG TERM GOAL #2   Title achieved 9/8     PT LONG TERM GOAL #3   Title The patient will negotiate 4 steps with one handrail and step-to pattern with SBA to demo improved lower extremity strength.  10-30-16  Baseline 10/28/16: continues to need 2 rails, step to pattern   Status Revised     PT LONG TERM GOAL #4   Title Pt will amb. 400' with rollator on flat even and uneven pavement for incr. community accessibility with SBA.  10-30-16   Baseline 10/28/16: pt able to go 325 feet with rollator, improved just not to goal   Status On-going     PT LONG TERM GOAL #5   Title The patient will decrease TUG score from 26.53 seconds to < or equal to 19 seconds with no device to demo decreasing risk for falls. Target date 10-30-16   Baseline 10/28/16: 22.78 sec's with cane; 20.34 sec's no AD, both improved just not to goals   Status On-going            Plan - 11/22/16 1626    Clinical Impression Statement Today's skilled session continued to address activity tolerance, gait with rollator and LE strengthening for improved mobility with only fatigue reported. No increase in knee pain reported. Pt is progressing toward goals and should benefit from continued PT to progress toward unmet goals.    Rehab Potential Good   Clinical Impairments Affecting Rehab Potential length of time since initial CVA (4 years);  early onset dementia; co-morbidities and lack of caregiver support during the day resulting in sedentary lifestyle   PT Frequency 2x / week   PT Duration 4 weeks  updated 11/16/16   PT Treatment/Interventions ADLs/Self Care Home Management;DME Instruction;Gait training;Stair training;Functional mobility training;Therapeutic  activities;Therapeutic exercise;Balance training;Neuromuscular re-education;Patient/family education   PT Next Visit Plan continue to work on balance and LE strengthening, gait with rollator   Consulted and Agree with Plan of Care Patient      Patient will benefit from skilled therapeutic intervention in order to improve the following deficits and impairments:  Abnormal gait, Decreased endurance, Decreased activity tolerance, Decreased balance, Decreased cognition, Decreased strength, Pain, Postural dysfunction, Decreased safety awareness  Visit Diagnosis: Muscle weakness (generalized)  Unsteadiness on feet  Other abnormalities of gait and mobility     Problem List Patient Active Problem List   Diagnosis Date Noted  . TIA (transient ischemic attack) 09/08/2012  . CVA (cerebral infarction) 04/04/2012  . Cerebellar stroke, acute ([place]) 04/01/2012  . 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 Nausea and vomiting 04/01/2012  . Polymyalgia rheumatica ([place]) 04/01/2012  . Bifascicular block   . Hypertension   . Hyperlipidemia   . Obstructive sleep apnea   . Diabetes mellitus ([place])     Willow Ora, PTA, [facility] [address] [place], [place] [address] [phone] 11/23/16, 12:15 PM   Name: [name] MRN: [id number] Date of Birth: 1934-08-09

## 2016-11-24 ENCOUNTER — Ambulatory Visit: Payer: Medicare Other | Admitting: Physical Therapy

## 2016-11-24 ENCOUNTER — Encounter: Payer: Self-pay | Admitting: Physical Therapy

## 2016-11-24 DIAGNOSIS — M6281 Muscle weakness (generalized): Secondary | ICD-10-CM

## 2016-11-24 DIAGNOSIS — R2689 Other abnormalities of gait and mobility: Secondary | ICD-10-CM

## 2016-11-24 DIAGNOSIS — R2681 Unsteadiness on feet: Secondary | ICD-10-CM | POA: Diagnosis not present

## 2016-11-24 NOTE — Addendum Note (Signed)
Addended by: Rexanne Mano on: 11/24/2016 06:12 PM   Modules accepted: Orders

## 2016-11-25 NOTE — Therapy (Signed)
[facility] [address] [place], [place], [address] Phone: [phone]   Fax:  [phone]  Physical Therapy Treatment  Patient Details  Name: [name] MRN: [id number] Date of Birth: August 07, 1934 Referring Provider: Dr. Shon Baton  Encounter Date: 11/24/2016      PT End of Session - 11/24/16 1543    Visit Number 9   Number of Visits 25   Date for PT Re-Evaluation 12/16/16   Authorization Type Medicare    Authorization Time Period 0-93-23 - 06-27-71; recertified 04/14/23 to 01/15/17   PT Start Time 1535   PT Stop Time 1615   PT Time Calculation (min) 40 min   Equipment Utilized During Treatment Gait belt   Activity Tolerance Patient tolerated treatment well   Behavior During Therapy [facility] for tasks assessed/performed      Past Medical History:  Diagnosis Date  . Abnormality of gait   . Arthritis   . Bifascicular bundle branch block    chronic  . Degenerative spinal arthritis    cervical and lumbar  . Dementia    early onset  . Dyspnea on exertion   . GERD (gastroesophageal reflux disease)   . History of CVA (cerebrovascular accident)    03-31-2012--  right cerebellar infart without hemorrhgia  . History of gastric ulcer    2011  w/ bleed  . History of kidney stones   . History of squamous cell carcinoma excision   . Hyperlipidemia   . Hypertension   . Hypothyroidism   . Obstructive sleep apnea    has not been using -- but states has appointment soon to get refitted   . Polymyalgia rheumatica ([place])   . Stroke ([place])   . Type 2 diabetes mellitus ([place])   . Urge urinary incontinence    refactory    Past Surgical History:  Procedure Laterality Date  . APPENDECTOMY  as child  . HERNIA REPAIR  1956  . INTERSTIM IMPLANT PLACEMENT N/A 11/19/2014   Procedure: Barrie Lyme IMPLANT FIRST STAGE AND ;  Surgeon: Bjorn Loser, MD;  [facility];  Service: Urology;  Laterality: N/A;  .  INTERSTIM IMPLANT PLACEMENT N/A 11/19/2014   Procedure: Barrie Lyme IMPLANT SECOND STAGE;  Surgeon: Bjorn Loser, MD;  Location: [facility];  Service: Urology;  Laterality: N/A;  . ORIF RIGHT WRIST FX  03-04-2004  . SHOULDER ARTHROSCOPY / DEBRIDEMENT/  SAD/  DCR/  MINI OPEN ROTATOR CUFF REPAIR Right 01-09-2003  . TEE WITHOUT CARDIOVERSION N/A 09/12/2012   Procedure: TRANSESOPHAGEAL ECHOCARDIOGRAM (TEE);  Surgeon: Lelon Perla, MD;  Location: [facility]COPY;  Service: Cardiovascular;  Laterality: N/A;  proximal septal thickening and chordal SAM creatin a narrow LVOT; turbulence noted  . TONSILLECTOMY  as child  . TOTAL KNEE ARTHROPLASTY Left 11-12-2004  . TRANSTHORACIC ECHOCARDIOGRAM  09-10-2012   mild LVH, mild  focal basal hypertrophy of the septum,  ef 42-70%, grade I diastolic dysfunction/  trival AR /  MV with moderate systolic anterior motion of the chordal structures/  mild LAE    There were no vitals filed for this visit.      Subjective Assessment - 11/24/16 1541    Subjective Had eye surgery yesterday to remove stye, looks better and still hurting. Still on eye drops and see's eye doctor again next Wed 12/01/16. No falls. Still with same knee pain.    Patient is accompained by: Family member  son in lobby   Pertinent History CVA 3 yrs  ago:  several mini strokes per son's report; Type II DM:  polymyalgia rheumatica: spinal arthritis:  early onset dementia:  Dyspnea on exertion   Limitations Lifting   Patient Stated Goals Improve mobility and balance   Currently in Pain? Yes   Pain Score 4    Pain Location Knee   Pain Orientation Right   Pain Descriptors / Indicators Aching;Discomfort;Sore   Pain Type Chronic pain   Pain Onset More than a month ago   Pain Frequency Constant   Aggravating Factors  increased activity, walking on it   Pain Relieving Factors cortisone shots (has not had one in a while), takes tylenol at times             [facility] Adult PT  Treatment/Exercise - 11/24/16 1545      Transfers   Transfers Sit to Stand;Stand to Sit   Sit to Stand 5: Supervision;With upper extremity assist;4: Min assist;Without upper extremity assist;With armrests;From chair/3-in-1   Stand to Sit 5: Supervision;With upper extremity assist;To bed;To chair/3-in-1     Ambulation/Gait   Ambulation/Gait Yes   Ambulation/Gait Assistance 5: Supervision;4: Min guard   Ambulation/Gait Assistance Details min cues on posture and rollator position with gait   Ambulation Distance (Feet) 500 Feet  with seated rest break about half way   Assistive device Rollator   Gait Pattern Step-through pattern;Decreased step length - right;Decreased step length - left;Shuffle;Trunk flexed   Ambulation Surface Level;Unlevel;Indoor;Outdoor;Paved   Curb Other (comment)  min guard assist   Curb Details (indicate cue type and reason) x1 rep with outdoor curb with rollator, pt able to demo correct technique with good use of brakes for safety              Balance Exercises - 11/24/16 1606      Balance Exercises: Standing   Rockerboard Anterior/posterior;Head turns;EO;EC;30 seconds;10 reps;20 seconds;Lateral     Balance Exercises: Standing   Rebounder Limitations performed both ways on balance board in parallel bars with on UE support (occasional touch for balance): EO rocking board, progressing to holding board steady with EC no head movements, progressing to EO head movements left<>right and up<>down. min to mod assist for balance with occasional touch to bars for balance.               PT Short Term Goals - 11/16/16 1644      PT SHORT TERM GOAL #1   Title see note 10/01/16           PT Long Term Goals - 11/16/16 1625      PT LONG TERM GOAL #1   Title The patient will improve gait speed from 1.37 ft/sec to >/= 1.65  ft/sec with rollator to demo improving mobility and lesser fall risk.   Target date 10-30-16 (NEW Target date for all LTGs 12-16-16)    Baseline 10/28/16: 1.53 ft/sec with pt carring cane vs using it, improved just not to goal; 9/24 update 1.56 ft/sec with rollator   Time 4   Period Weeks   Status Revised   Target Date 12/16/16     PT LONG TERM GOAL #2   Title achieved 9/8     PT LONG TERM GOAL #3   Title The patient will negotiate 4 steps with one handrail and step-to pattern with SBA to demo improved lower extremity strength.  10-30-16   Baseline 10/28/16: continues to need 2 rails, step to pattern   Status Revised     PT LONG TERM GOAL #4  Title Pt will amb. 400' with rollator on flat even and uneven pavement for incr. community accessibility with SBA.  10-30-16   Baseline 10/28/16: pt able to go 325 feet with rollator, improved just not to goal   Status On-going     PT LONG TERM GOAL #5   Title The patient will decrease TUG score from 26.53 seconds to < or equal to 19 seconds with no device to demo decreasing risk for falls. Target date 10-30-16   Baseline 10/28/16: 22.78 sec's with cane; 20.34 sec's no AD, both improved just not to goals   Status On-going           Plan - 11/24/16 1543    Clinical Impression Statement Today's skilled session addressed increased activity tolearance via increased gait distance before resting and balance with no significant issues reported. Pt is progressing toward goals and should benefit from continued PT to progress toward unmet goals.   Rehab Potential Good   Clinical Impairments Affecting Rehab Potential length of time since initial CVA (4 years);  early onset dementia; co-morbidities and lack of caregiver support during the day resulting in sedentary lifestyle   PT Frequency 2x / week   PT Duration 4 weeks  updated 11/16/16   PT Treatment/Interventions ADLs/Self Care Home Management;DME Instruction;Gait training;Stair training;Functional mobility training;Therapeutic activities;Therapeutic exercise;Balance training;Neuromuscular re-education;Patient/family education   PT Next Visit  Plan G-code next visit; continue to work on balance and LE strengthening, gait with rollator   Consulted and Agree with Plan of Care Patient      Patient will benefit from skilled therapeutic intervention in order to improve the following deficits and impairments:  Abnormal gait, Decreased endurance, Decreased activity tolerance, Decreased balance, Decreased cognition, Decreased strength, Pain, Postural dysfunction, Decreased safety awareness  Visit Diagnosis: Muscle weakness (generalized)  Unsteadiness on feet  Other abnormalities of gait and mobility     Problem List Patient Active Problem List   Diagnosis Date Noted  . TIA (transient ischemic attack) 09/08/2012  . CVA (cerebral infarction) 04/04/2012  . Cerebellar stroke, acute ([place]) 04/01/2012  . 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 Nausea and vomiting 04/01/2012  . Polymyalgia rheumatica ([place]) 04/01/2012  . Bifascicular block   . Hypertension   . Hyperlipidemia   . Obstructive sleep apnea   . Diabetes mellitus ([place])     Willow Ora, PTA, [facility] [address], [place], [place] [address] [phone] 11/25/16, 4:27 PM   Name: [name] MRN: [id number] Date of Birth: 18-Nov-1934

## 2016-11-29 ENCOUNTER — Ambulatory Visit: Payer: Medicare Other | Admitting: Physical Therapy

## 2016-11-29 ENCOUNTER — Encounter: Payer: Self-pay | Admitting: Physical Therapy

## 2016-11-29 DIAGNOSIS — R2689 Other abnormalities of gait and mobility: Secondary | ICD-10-CM

## 2016-11-29 DIAGNOSIS — M6281 Muscle weakness (generalized): Secondary | ICD-10-CM

## 2016-11-29 DIAGNOSIS — R2681 Unsteadiness on feet: Secondary | ICD-10-CM

## 2016-11-29 NOTE — Therapy (Signed)
[facility] [address] [place], [place], [address] Phone: [phone]   Fax:  [phone]  Physical Therapy Treatment  Patient Details  Name: [name] MRN: [id number] Date of Birth: 02-03-1935 Referring Provider: Dr. Shon Baton  Encounter Date: 11/29/2016      PT End of Session - 11/29/16 1327    Visit Number 10  g-code due visit #16   Number of Visits 25   Date for PT Re-Evaluation 12/16/16   Authorization Type Medicare    Authorization Time Period 09-16-34 - 07-27-38; recertified 3/47/42 to 01/15/17   PT Start Time 1320   PT Stop Time 1400   PT Time Calculation (min) 40 min   Equipment Utilized During Treatment Gait belt   Activity Tolerance Patient tolerated treatment well   Behavior During Therapy [facility] for tasks assessed/performed      Past Medical History:  Diagnosis Date  . Abnormality of gait   . Arthritis   . Bifascicular bundle branch block    chronic  . Degenerative spinal arthritis    cervical and lumbar  . Dementia    early onset  . Dyspnea on exertion   . GERD (gastroesophageal reflux disease)   . History of CVA (cerebrovascular accident)    03-31-2012--  right cerebellar infart without hemorrhgia  . History of gastric ulcer    2011  w/ bleed  . History of kidney stones   . History of squamous cell carcinoma excision   . Hyperlipidemia   . Hypertension   . Hypothyroidism   . Obstructive sleep apnea    has not been using -- but states has appointment soon to get refitted   . Polymyalgia rheumatica ([place])   . Stroke ([place])   . Type 2 diabetes mellitus ([place])   . Urge urinary incontinence    refactory    Past Surgical History:  Procedure Laterality Date  . APPENDECTOMY  as child  . HERNIA REPAIR  1956  . INTERSTIM IMPLANT PLACEMENT N/A 11/19/2014   Procedure: Barrie Lyme IMPLANT FIRST STAGE AND ;  Surgeon: Bjorn Loser, MD;  Location: [facility];  Service:  Urology;  Laterality: N/A;  . INTERSTIM IMPLANT PLACEMENT N/A 11/19/2014   Procedure: Barrie Lyme IMPLANT SECOND STAGE;  Surgeon: Bjorn Loser, MD;  Location: [facility];  Service: Urology;  Laterality: N/A;  . ORIF RIGHT WRIST FX  03-04-2004  . SHOULDER ARTHROSCOPY / DEBRIDEMENT/  SAD/  DCR/  MINI OPEN ROTATOR CUFF REPAIR Right 01-09-2003  . TEE WITHOUT CARDIOVERSION N/A 09/12/2012   Procedure: TRANSESOPHAGEAL ECHOCARDIOGRAM (TEE);  Surgeon: Lelon Perla, MD;  Location[facility] ENDOSCOPY;  Service: Cardiovascular;  Laterality: N/A;  proximal septal thickening and chordal SAM creatin a narrow LVOT; turbulence noted  . TONSILLECTOMY  as child  . TOTAL KNEE ARTHROPLASTY Left 11-12-2004  . TRANSTHORACIC ECHOCARDIOGRAM  09-10-2012   mild LVH, mild  focal basal hypertrophy of the septum,  ef 59-56%, grade I diastolic dysfunction/  trival AR /  MV with moderate systolic anterior motion of the chordal structures/  mild LAE    There were no vitals filed for this visit.      Subjective Assessment - 11/29/16 1325    Subjective No new complaints. No falls to report.     Patient is accompained by: Family member  son in lobby   Pertinent History CVA 3 yrs ago:  several mini strokes per son's report; Type II DM:  polymyalgia rheumatica: spinal arthritis:  early  onset dementia:  Dyspnea on exertion   Limitations Lifting   Patient Stated Goals Improve mobility and balance   Currently in Pain? Yes   Pain Score 3    Pain Location Knee   Pain Orientation Right   Pain Descriptors / Indicators Aching;Discomfort;Sore   Pain Type Chronic pain   Pain Onset More than a month ago   Pain Frequency Constant   Aggravating Factors  increased activity, walking on it   Pain Relieving Factors cortisone shots, tylenol at times             [facility] Adult PT Treatment/Exercise - 11/29/16 1328      Transfers   Transfers Sit to Stand;Stand to Sit   Sit to Stand 5: Supervision;With upper extremity  assist;4: Min assist;Without upper extremity assist;With armrests;From chair/3-in-1   Sit to Stand Details Verbal cues for safe use of DME/AE   Stand to Sit 5: Supervision;With upper extremity assist;To bed;To chair/3-in-1   Stand to Sit Details (indicate cue type and reason) Verbal cues for safe use of DME/AE     Ambulation/Gait   Ambulation/Gait Yes   Ambulation/Gait Assistance 5: Supervision;4: Min guard   Ambulation/Gait Assistance Details seated rest break about half way around for 1-2 minutes. cues on brake use with gait, cues to stay closer to rollator and for increased step length with gait.     Ambulation Distance (Feet) 500 Feet  x1   Assistive device Rollator   Gait Pattern Step-through pattern;Decreased step length - right;Decreased step length - left;Shuffle;Trunk flexed   Ambulation Surface Level;Unlevel;Indoor;Outdoor;Paved             Balance Exercises - 11/29/16 1357      Balance Exercises: Standing   Standing Eyes Closed Narrow base of support (BOS);Wide (BOA);Head turns;Foam/compliant surface;Other reps (comment);30 secs;Limitations     Balance Exercises: Standing   Standing Eyes Closed Limitations standing on airex with no UE support (in parallel bars for safety): narrow base of support EC no head movements, progressing to wide base of support with EC for head movements left<>right, up<>down and diagonals both ways. min assist with cues on posture and weight shifitng to work              PT Short Term Goals - 11/16/16 1644      PT SHORT TERM GOAL #1   Title see note 10/01/16           PT Long Term Goals - 11/16/16 1625      PT LONG TERM GOAL #1   Title The patient will improve gait speed from 1.37 ft/sec to >/= 1.65  ft/sec with rollator to demo improving mobility and lesser fall risk.   Target date 10-30-16 (NEW Target date for all LTGs 12-16-16)   Baseline 10/28/16: 1.53 ft/sec with pt carring cane vs using it, improved just not to goal; 9/24 update  1.56 ft/sec with rollator   Time 4   Period Weeks   Status Revised   Target Date 12/16/16     PT LONG TERM GOAL #2   Title achieved 9/8     PT LONG TERM GOAL #3   Title The patient will negotiate 4 steps with one handrail and step-to pattern with SBA to demo improved lower extremity strength.  10-30-16   Baseline 10/28/16: continues to need 2 rails, step to pattern   Status Revised     PT LONG TERM GOAL #4   Title Pt will amb. 400' with rollator on flat even  and uneven pavement for incr. community accessibility with SBA.  10-30-16   Baseline 10/28/16: pt able to go 325 feet with rollator, improved just not to goal   Status On-going     PT LONG TERM GOAL #5   Title The patient will decrease TUG score from 26.53 seconds to < or equal to 19 seconds with no device to demo decreasing risk for falls. Target date 10-30-16   Baseline 10/28/16: 22.78 sec's with cane; 20.34 sec's no AD, both improved just not to goals   Status On-going               Plan - 11/29/16 1327    Clinical Impression Statement Today's skilled session continued to address gait with rollator on various surfaces and balance activities with only fatigue reported. Pt is progressing toward goals and should benefit from continued PT to progress toward unmet goals   Rehab Potential Good   Clinical Impairments Affecting Rehab Potential length of time since initial CVA (4 years);  early onset dementia; co-morbidities and lack of caregiver support during the day resulting in sedentary lifestyle   PT Frequency 2x / week   PT Duration 4 weeks  updated 11/16/16   PT Treatment/Interventions ADLs/Self Care Home Management;DME Instruction;Gait training;Stair training;Functional mobility training;Therapeutic activities;Therapeutic exercise;Balance training;Neuromuscular re-education;Patient/family education   PT Next Visit Plan  continue to work on balance and LE strengthening, gait with rollator   Consulted and Agree with Plan of Care  Patient      Patient will benefit from skilled therapeutic intervention in order to improve the following deficits and impairments:  Abnormal gait, Decreased endurance, Decreased activity tolerance, Decreased balance, Decreased cognition, Decreased strength, Pain, Postural dysfunction, Decreased safety awareness  Visit Diagnosis: Muscle weakness (generalized)  Unsteadiness on feet  Other abnormalities of gait and mobility     Problem List Patient Active Problem List   Diagnosis Date Noted  . TIA (transient ischemic attack) 09/08/2012  . CVA (cerebral infarction) 04/04/2012  . Cerebellar stroke, acute ([place]) 04/01/2012  . 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 Nausea and vomiting 04/01/2012  . Polymyalgia rheumatica ([place]) 04/01/2012  . Bifascicular block   . Hypertension   . Hyperlipidemia   . Obstructive sleep apnea   . Diabetes mellitus ([place])     Willow Ora, PTA, [facility] [address] [place], [place] [address] [phone] 11/29/16, 9:58 PM   Name: [name] MRN: [id number] Date of Birth: 07/25/1934

## 2016-12-01 ENCOUNTER — Ambulatory Visit: Payer: Medicare Other | Admitting: *Deleted

## 2016-12-01 DIAGNOSIS — H34812 Central retinal vein occlusion, left eye, with macular edema: Secondary | ICD-10-CM | POA: Diagnosis not present

## 2016-12-03 ENCOUNTER — Ambulatory Visit: Payer: Medicare Other | Admitting: Physical Therapy

## 2016-12-06 ENCOUNTER — Ambulatory Visit: Payer: Medicare Other | Admitting: Physical Therapy

## 2016-12-08 ENCOUNTER — Ambulatory Visit: Payer: Medicare Other | Admitting: Physical Therapy

## 2016-12-08 ENCOUNTER — Encounter: Payer: Self-pay | Admitting: Physical Therapy

## 2016-12-08 DIAGNOSIS — R2681 Unsteadiness on feet: Secondary | ICD-10-CM

## 2016-12-08 DIAGNOSIS — M6281 Muscle weakness (generalized): Secondary | ICD-10-CM

## 2016-12-08 DIAGNOSIS — R2689 Other abnormalities of gait and mobility: Secondary | ICD-10-CM | POA: Diagnosis not present

## 2016-12-08 NOTE — Therapy (Signed)
[facility] [address] [place], [place], [address] Phone: [phone]   Fax:  [phone]  Physical Therapy Treatment  Patient Details  Name: [name] MRN: [id number] Date of Birth: 1934-09-04 Referring Provider: Dr. Shon Baton  Encounter Date: 12/08/2016      PT End of Session - 12/08/16 1455    Visit Number 11  g-code due visit #16   Number of Visits 25   Date for PT Re-Evaluation 12/16/16   Authorization Type Medicare    Authorization Time Period 0-17-79 - 04-30-01; recertified 0/09/23 to 01/15/17   PT Start Time 1354   PT Stop Time 1431   PT Time Calculation (min) 37 min   Equipment Utilized During Treatment Gait belt   Activity Tolerance Patient tolerated treatment well   Behavior During Therapy [facility] for tasks assessed/performed      Past Medical History:  Diagnosis Date  . Abnormality of gait   . Arthritis   . Bifascicular bundle branch block    chronic  . Degenerative spinal arthritis    cervical and lumbar  . Dementia    early onset  . Dyspnea on exertion   . GERD (gastroesophageal reflux disease)   . History of CVA (cerebrovascular accident)    03-31-2012--  right cerebellar infart without hemorrhgia  . History of gastric ulcer    2011  w/ bleed  . History of kidney stones   . History of squamous cell carcinoma excision   . Hyperlipidemia   . Hypertension   . Hypothyroidism   . Obstructive sleep apnea    has not been using -- but states has appointment soon to get refitted   . Polymyalgia rheumatica ([place])   . Stroke ([place])   . Type 2 diabetes mellitus ([place])   . Urge urinary incontinence    refactory    Past Surgical History:  Procedure Laterality Date  . APPENDECTOMY  as child  . HERNIA REPAIR  1956  . INTERSTIM IMPLANT PLACEMENT N/A 11/19/2014   Procedure: Barrie Lyme IMPLANT FIRST STAGE AND ;  Surgeon: Bjorn Loser, MD;  Location: [facility];  Service:  Urology;  Laterality: N/A;  . INTERSTIM IMPLANT PLACEMENT N/A 11/19/2014   Procedure: Barrie Lyme IMPLANT SECOND STAGE;  Surgeon: Bjorn Loser, MD;  Location: [facility];  Service: Urology;  Laterality: N/A;  . ORIF RIGHT WRIST FX  03-04-2004  . SHOULDER ARTHROSCOPY / DEBRIDEMENT/  SAD/  DCR/  MINI OPEN ROTATOR CUFF REPAIR Right 01-09-2003  . TEE WITHOUT CARDIOVERSION N/A 09/12/2012   Procedure: TRANSESOPHAGEAL ECHOCARDIOGRAM (TEE);  Surgeon: Lelon Perla, MD;  Location: [facility] ENDOSCOPY;  Service: Cardiovascular;  Laterality: N/A;  proximal septal thickening and chordal SAM creatin a narrow LVOT; turbulence noted  . TONSILLECTOMY  as child  . TOTAL KNEE ARTHROPLASTY Left 11-12-2004  . TRANSTHORACIC ECHOCARDIOGRAM  09-10-2012   mild LVH, mild  focal basal hypertrophy of the septum,  ef 30-07%, grade I diastolic dysfunction/  trival AR /  MV with moderate systolic anterior motion of the chordal structures/  mild LAE    There were no vitals filed for this visit.      Subjective Assessment - 12/08/16 1519    Subjective No new complaints, no falls to report, pain in right knee per usual.    Patient is accompained by: Family member   Pertinent History CVA 3 yrs ago:  several mini strokes per son's report; Type II DM:  polymyalgia rheumatica: spinal arthritis:  early onset dementia:  Dyspnea on exertion   Limitations Lifting   Patient Stated Goals Improve mobility and balance   Currently in Pain? Yes   Pain Score 3    Pain Location Knee   Pain Orientation Right   Pain Descriptors / Indicators Aching;Discomfort;Sore   Pain Type Chronic pain   Aggravating Factors  activity   Pain Relieving Factors getting off of it, resting                 [facility] Adult PT Treatment/Exercise - 12/08/16 0001      Ambulation/Gait   Ambulation/Gait Yes   Ambulation/Gait Assistance 5: Supervision   Ambulation/Gait Assistance Details cues for posture, picking up feet, and staying closer  to walker   Ambulation Distance (Feet) 232 Feet   Assistive device Rollator   Gait Pattern Step-through pattern;Decreased hip/knee flexion - right;Decreased hip/knee flexion - left;Trunk flexed   Ambulation Surface Level;Indoor   Stairs Yes   Stairs Assistance 4: Min guard   Stairs Assistance Details (indicate cue type and reason) Step to pattern, increased time, used both rails, cues to go up with left foot/down with right foot    Stair Management Technique Two rails;Forwards;Step to pattern   Number of Stairs 8   Height of Stairs 6     Neuro Re-ed    Neuro Re-ed Details  Seated exercises on Green ball x10 reps each: SLR BLE's, UE raise BUE's. Pelvic tilts, pelvic rotation, Side to side, Pt very unsteady during activities on ball, trouble with lifting leg, became quickly fatigued with ball exercises.      Exercises   Exercises Lumbar     Knee/Hip Exercises: Standing   Hip ADduction AROM;Both;10 reps;Limitations   Hip ADduction Limitations Chair in front for BUE support, Pt complained of pain in right leg when lifting, cues to slow down,   Hip Extension AROM;Both;10 reps;Knee straight;Limitations   Extension Limitations Chair in front for BUE support, cues to slow down and straighten leg   Other Standing Knee Exercises Marching x10 BLE with chair in front for BUE support, cues to slow down and bring knee up higher             PT Education - 12/08/16 1526    Education provided Yes   Education Details Stair training, going up with left leg/down with right leg, the importance of her HEP and doing them frequently.    Person(s) Educated Patient   Methods Explanation;Demonstration   Comprehension Verbalized understanding;Returned demonstration          PT Short Term Goals - 12/08/16 1527      PT SHORT TERM GOAL #1   Title see note 10/01/16   Baseline 10/13/16: intermittently doing some of them.    Period Weeks   Status Partially Met     PT SHORT TERM GOAL #2   Title Pt  will increase gait velocity from 1.37 ft/sec to 1.7 ft/sec with no device (as this is how assessed at eval per pt's request to perform amb. tests without cane).   10-01-16 TARGET DATE   Baseline 10/13/16: 1.56 ft/sec with rollator, 1.02 ft/sec with no AD. pt has increased gait speed from eval with use of rollator, however without an device her gait speed has declined since eval.    Time 4   Period Weeks   Status Partially Met     PT SHORT TERM GOAL #3   Title The patient will improve Berg from 34/56 up to 39/56 to demo  decreasing risk for falls.    TARGET DATE 10-01-16   Baseline 04/15/16: pt scored 40/56 today   Period Weeks   Status Achieved     PT SHORT TERM GOAL #4   Title The patient will decrease TUG score from 26.53 seconds to < or equal to 22 seconds with no device to demo decreasing risk for falls.     10-01-16   Baseline 10/13/16: 24.47 sec's no AD, 23.37 sec's with straight cane (SPC), 24.03 sec's with rollator. pt has decreased TUG with all devices, just not to goal with use of any of these devices.    Time 4   Period Weeks   Status Partially Met     PT SHORT TERM GOAL #5   Title Amb. 3" nonstop with RW to demo improved endurance/activity tolerance.   10-01-16   Baseline 10/28/16: met today   Time 4   Period Weeks   Status Achieved           PT Long Term Goals - 12/08/16 1531      PT LONG TERM GOAL #1   Title The patient will improve gait speed from 1.37 ft/sec to >/= 1.65  ft/sec with rollator to demo improving mobility and lesser fall risk.   Target date 10-30-16 (NEW Target date for all LTGs 12-16-16)   Baseline 10/28/16: 1.53 ft/sec with pt carring cane vs using it, improved just not to goal; 9/24 update 1.56 ft/sec with rollator   Time 4   Period Weeks   Status Revised     PT LONG TERM GOAL #2   Title achieved 9/8   Status Achieved     PT LONG TERM GOAL #3   Title The patient will negotiate 4 steps with one handrail and step-to pattern with SBA to demo improved lower  extremity strength.  10-30-16   Baseline 10/28/16: continues to need 2 rails, step to pattern   Status Revised     PT LONG TERM GOAL #4   Title Pt will amb. 400' with rollator on flat even and uneven pavement for incr. community accessibility with SBA.  10-30-16   Baseline 10/28/16: pt able to go 325 feet with rollator, improved just not to goal   Status On-going     PT LONG TERM GOAL #5   Title The patient will decrease TUG score from 26.53 seconds to < or equal to 19 seconds with no device to demo decreasing risk for falls. Target date 10-30-16   Baseline 10/28/16: 22.78 sec's with cane; 20.34 sec's no AD, both improved just not to goals   Status On-going               Plan - 12/08/16 1538    Clinical Impression Statement Pt limited by fatigue and weakness during todays session. Todays session focused on core/LE strengthening and gait.  Pt slowly progressing towards goals and should benefit from continued PT to progress toward unmet goals.    Rehab Potential Good   Clinical Impairments Affecting Rehab Potential length of time since initial CVA (4 years);  early onset dementia; co-morbidities and lack of caregiver support during the day resulting in sedentary lifestyle   PT Frequency 2x / week   PT Duration 4 weeks  updated 11/16/16   PT Treatment/Interventions ADLs/Self Care Home Management;DME Instruction;Gait training;Stair training;Functional mobility training;Therapeutic activities;Therapeutic exercise;Balance training;Neuromuscular re-education;Patient/family education   PT Next Visit Plan  continue to work on balance and LE strengthening, gait with rollator, and review HEP exercises  Consulted and Agree with Plan of Care Patient      Patient will benefit from skilled therapeutic intervention in order to improve the following deficits and impairments:  Abnormal gait, Decreased endurance, Decreased activity tolerance, Decreased balance, Decreased cognition, Decreased strength, Pain,  Postural dysfunction, Decreased safety awareness  Visit Diagnosis: Muscle weakness (generalized)  Unsteadiness on feet  Other abnormalities of gait and mobility     Problem List Patient Active Problem List   Diagnosis Date Noted  . TIA (transient ischemic attack) 09/08/2012  . CVA (cerebral infarction) 04/04/2012  . Cerebellar stroke, acute ([place]) 04/01/2012  . 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 Nausea and vomiting 04/01/2012  . Polymyalgia rheumatica ([place]) 04/01/2012  . Bifascicular block   . Hypertension   . Hyperlipidemia   . Obstructive sleep apnea   . Diabetes mellitus ([place])    Claud Gowan, SPTA  Aaryn Sermon 12/08/2016, 4:09 PM  [facility] [address] [place], [place], [address] Phone: [phone]   Fax:  [phone]  Name: [name] MRN: [id number] Date of Birth: 09-Nov-1934

## 2016-12-09 ENCOUNTER — Ambulatory Visit: Payer: Self-pay

## 2016-12-13 ENCOUNTER — Ambulatory Visit: Payer: Medicare Other | Admitting: Physical Therapy

## 2016-12-13 ENCOUNTER — Encounter: Payer: Self-pay | Admitting: Physical Therapy

## 2016-12-13 DIAGNOSIS — R2681 Unsteadiness on feet: Secondary | ICD-10-CM

## 2016-12-13 DIAGNOSIS — M6281 Muscle weakness (generalized): Secondary | ICD-10-CM | POA: Diagnosis not present

## 2016-12-13 DIAGNOSIS — R2689 Other abnormalities of gait and mobility: Secondary | ICD-10-CM

## 2016-12-13 NOTE — Therapy (Signed)
[facility] [address] [place], [place], [address] Phone: [phone]   Fax:  [phone]  Physical Therapy Treatment  Patient Details  Name: [name] MRN: [id number] Date of Birth: 09/04/1934 Referring Provider: Dr. Shon Baton  Encounter Date: 12/13/2016      PT End of Session - 12/13/16 1642    Visit Number 12  g-code due visit #16   Number of Visits 25   Date for PT Re-Evaluation 12/16/16   Authorization Type Medicare    Authorization Time Period 4-76-54 - 07-27-01; recertified 5/46/56 to 01/15/17   PT Start Time 1535   PT Stop Time 1615   PT Time Calculation (min) 40 min   Equipment Utilized During Treatment Gait belt   Activity Tolerance Patient tolerated treatment well   Behavior During Therapy [facility] for tasks assessed/performed      Past Medical History:  Diagnosis Date  . Abnormality of gait   . Arthritis   . Bifascicular bundle branch block    chronic  . Degenerative spinal arthritis    cervical and lumbar  . Dementia    early onset  . Dyspnea on exertion   . GERD (gastroesophageal reflux disease)   . History of CVA (cerebrovascular accident)    03-31-2012--  right cerebellar infart without hemorrhgia  . History of gastric ulcer    2011  w/ bleed  . History of kidney stones   . History of squamous cell carcinoma excision   . Hyperlipidemia   . Hypertension   . Hypothyroidism   . Obstructive sleep apnea    has not been using -- but states has appointment soon to get refitted   . Polymyalgia rheumatica ([place])   . Stroke ([place])   . Type 2 diabetes mellitus ([place])   . Urge urinary incontinence    refactory    Past Surgical History:  Procedure Laterality Date  . APPENDECTOMY  as child  . HERNIA REPAIR  1956  . INTERSTIM IMPLANT PLACEMENT N/A 11/19/2014   Procedure: Barrie Lyme IMPLANT FIRST STAGE AND ;  Surgeon: Bjorn Loser, MD;  Location: [facility];  Service:  Urology;  Laterality: N/A;  . INTERSTIM IMPLANT PLACEMENT N/A 11/19/2014   Procedure: Barrie Lyme IMPLANT SECOND STAGE;  Surgeon: Bjorn Loser, MD;  Location: [facility]  Service: Urology;  Laterality: N/A;  . ORIF RIGHT WRIST FX  03-04-2004  . SHOULDER ARTHROSCOPY / DEBRIDEMENT/  SAD/  DCR/  MINI OPEN ROTATOR CUFF REPAIR Right 01-09-2003  . TEE WITHOUT CARDIOVERSION N/A 09/12/2012   Procedure: TRANSESOPHAGEAL ECHOCARDIOGRAM (TEE);  Surgeon: Lelon Perla, MD;  Location: [facility] ENDOSCOPY;  Service: Cardiovascular;  Laterality: N/A;  proximal septal thickening and chordal SAM creatin a narrow LVOT; turbulence noted  . TONSILLECTOMY  as child  . TOTAL KNEE ARTHROPLASTY Left 11-12-2004  . TRANSTHORACIC ECHOCARDIOGRAM  09-10-2012   mild LVH, mild  focal basal hypertrophy of the septum,  ef 81-27%, grade I diastolic dysfunction/  trival AR /  MV with moderate systolic anterior motion of the chordal structures/  mild LAE    There were no vitals filed for this visit.      Subjective Assessment - 12/13/16 1542    Subjective No new complaints, No falls, some knee pain today    Patient is accompained by: Family member   Pertinent History CVA 3 yrs ago:  several mini strokes per son's report; Type II DM:  polymyalgia rheumatica: spinal arthritis:  early onset dementia:  Dyspnea on exertion   Limitations Lifting   Currently in Pain? Yes   Pain Score 3    Pain Location Knee   Pain Orientation Right   Pain Descriptors / Indicators Aching;Sore;Discomfort   Pain Type Chronic pain   Pain Onset More than a month ago   Pain Frequency Constant   Aggravating Factors  activity   Pain Relieving Factors resting it             [facility] PT Assessment - 12/13/16 0001      Timed Up and Go Test   TUG Normal TUG   Normal TUG (seconds) 26.25  with AD (rollator), 20.94 without AD   TUG Comments Pt required min assist with no AD and supervision with AD.               [facility] Adult PT  Treatment/Exercise - 12/13/16 0001      Ambulation/Gait   Ambulation/Gait Yes   Ambulation/Gait Assistance 5: Supervision   Ambulation/Gait Assistance Details cues to pick up feet, to lock walker when sitting or standing.    Ambulation Distance (Feet) 274 Feet   Assistive device Rollator   Gait Pattern Step-through pattern;Decreased stride length;Decreased hip/knee flexion - right;Decreased hip/knee flexion - left   Ambulation Surface Level;Indoor   Gait velocity 19.31 secs with rollator= 1.70 ft/sec   Stairs Yes   Stairs Assistance 5: Supervision   Stairs Assistance Details (indicate cue type and reason) Step to pattern, only 1 rail used, ascended/descended 4 steps, supervision for safety, pt remembered 'Up with good, down with bad".    Stair Management Technique One rail Right;Step to pattern;Forwards   Number of Stairs 4   Height of Stairs 6     Exercises   Exercises Knee/Hip     Knee/Hip Exercises: Seated   Other Seated Knee/Hip Exercises SLR seated on mat, 2# ankle weight, x10 reps, Pt shows more weakness on RLE, cues to slow down.  Resisted Plantar Flexion with RTB, 2# ankle weight, x10 reps, cues to slow down, cues for technique    Marching AROM;Both;10 reps;Weights;Limitations   Marching Limitations Pt seated on mat with 2# ankle weights, x10 reps, cues required to slow down movements and elevate knees more                  PT Short Term Goals - 12/13/16 1638      PT SHORT TERM GOAL #1   Title see note 10/01/16   Baseline 10/13/16: intermittently doing some of them.    Period Weeks   Status Partially Met     PT SHORT TERM GOAL #2   Title Pt will increase gait velocity from 1.37 ft/sec to 1.7 ft/sec with no device (as this is how assessed at eval per pt's request to perform amb. tests without cane).   10-01-16 TARGET DATE   Baseline 10/13/16: 1.56 ft/sec with rollator, 1.02 ft/sec with no AD. pt has increased gait speed from eval with use of rollator, however  without an device her gait speed has declined since eval.    Time 4   Period Weeks   Status Partially Met     PT SHORT TERM GOAL #3   Title The patient will improve Berg from 34/56 up to 39/56 to demo decreasing risk for falls.    TARGET DATE 10-01-16   Baseline 04/15/16: pt scored 40/56 today   Period Weeks   Status Achieved     PT SHORT TERM GOAL #4   Title  The patient will decrease TUG score from 26.53 seconds to < or equal to 22 seconds with no device to demo decreasing risk for falls.     10-01-16   Baseline 10/13/16: 24.47 sec's no AD, 23.37 sec's with straight cane (SPC), 24.03 sec's with rollator. pt has decreased TUG with all devices, just not to goal with use of any of these devices.    Time 4   Period Weeks   Status Partially Met     PT SHORT TERM GOAL #5   Title Amb. 3" nonstop with RW to demo improved endurance/activity tolerance.   10-01-16   Baseline 10/28/16: met today   Time 4   Period Weeks   Status Achieved           PT Long Term Goals - 12/13/16 1638      PT LONG TERM GOAL #1   Title The patient will improve gait speed from 1.37 ft/sec to >/= 1.65  ft/sec with rollator to demo improving mobility and lesser fall risk.   Target date 10-30-16 (NEW Target date for all LTGs 12-16-16)   Baseline 12/13/16: Pt increased gait speed to 1.70 ft/sec with rollator    Time 4   Period Weeks   Status Achieved     PT LONG TERM GOAL #2   Title achieved 9/8   Status Achieved     PT LONG TERM GOAL #3   Title The patient will negotiate 4 steps with one handrail and step-to pattern with SBA to demo improved lower extremity strength.  10-30-16   Baseline 12/13/16: Pt negotiated 4 steps with use of only 1 rail, step to pattern with supervision for safety   Status Achieved     PT LONG TERM GOAL #4   Title Pt will amb. 400' with rollator on flat even and uneven pavement for incr. community accessibility with SBA.  10-30-16   Baseline 10/28/16: pt able to go 325 feet with rollator,  improved just not to goal   Status On-going     PT LONG TERM GOAL #5   Title The patient will decrease TUG score from 26.53 seconds to < or equal to 19 seconds with no device to demo decreasing risk for falls. Target date 10-30-16   Baseline 10/28/16: 22.78 sec's with cane; 20.34 sec's no AD, both improved just not to goals   Status On-going               Plan - 12/13/16 1644    Clinical Impression Statement Pt tolerated treatment well with some fatigue with ambulation. Todays session focused on completing long term goals. Pt has met 3 out of 5 long term goals. Pt may benefit from continued PT to progress toward unmet goals.    Rehab Potential Good   Clinical Impairments Affecting Rehab Potential length of time since initial CVA (4 years);  early onset dementia; co-morbidities and lack of caregiver support during the day resulting in sedentary lifestyle   PT Frequency 2x / week   PT Duration 4 weeks  updated 11/16/16   PT Treatment/Interventions ADLs/Self Care Home Management;DME Instruction;Gait training;Stair training;Functional mobility training;Therapeutic activities;Therapeutic exercise;Balance training;Neuromuscular re-education;Patient/family education   PT Next Visit Plan  Focus on unmet longterm goals and review HEP exercises   Consulted and Agree with Plan of Care Patient      Patient will benefit from skilled therapeutic intervention in order to improve the following deficits and impairments:  Abnormal gait, Decreased endurance, Decreased activity tolerance, Decreased balance, Decreased  cognition, Decreased strength, Pain, Postural dysfunction, Decreased safety awareness  Visit Diagnosis: Muscle weakness (generalized)  Unsteadiness on feet  Other abnormalities of gait and mobility     Problem List Patient Active Problem List   Diagnosis Date Noted  . TIA (transient ischemic attack) 09/08/2012  . CVA (cerebral infarction) 04/04/2012  . Cerebellar stroke, acute  ([place]) 04/01/2012  . 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 Nausea and vomiting 04/01/2012  . Polymyalgia rheumatica ([place]) 04/01/2012  . Bifascicular block   . Hypertension   . Hyperlipidemia   . Obstructive sleep apnea   . Diabetes mellitus ([place])    [name], SPTA  Clydette Privitera 12/13/2016, 4:48 PM  [facility] [address] [place], [place], [address] Phone: [phone]   Fax:  [phone]  Name: [name] MRN: [id number] Date of Birth: [birth year]

## 2016-12-15 ENCOUNTER — Ambulatory Visit: Payer: Medicare Other | Admitting: Physical Therapy

## 2016-12-21 ENCOUNTER — Encounter: Payer: Self-pay | Admitting: Physical Therapy

## 2016-12-21 ENCOUNTER — Ambulatory Visit: Payer: Medicare Other | Admitting: Physical Therapy

## 2016-12-21 DIAGNOSIS — R2689 Other abnormalities of gait and mobility: Secondary | ICD-10-CM

## 2016-12-21 DIAGNOSIS — R2681 Unsteadiness on feet: Secondary | ICD-10-CM

## 2016-12-21 DIAGNOSIS — M6281 Muscle weakness (generalized): Secondary | ICD-10-CM

## 2016-12-21 NOTE — Therapy (Addendum)
[facility] [address] [place], [place], [address] Phone: [phone]   Fax:  [phone]  Physical Therapy Treatment  Patient Details  Name: [name] MRN: [id number] Date of Birth: 03-Mar-1934 Referring Provider: Dr. Shon Baton  Encounter Date: 12/21/2016     PT End of Session - 12/21/16 1705    Visit Number 13  g-code due visit #16   Number of Visits 25   Date for PT Re-Evaluation 12/16/16   Authorization Type Medicare    Authorization Time Period 1-69-67 - 09-30-36; recertified 02/22/73 to 01/15/17   PT Start Time 1630  pt arrived 15 min late for appt today   PT Stop Time 1700   PT Time Calculation (min) 30 min   Equipment Utilized During Treatment Gait belt   Activity Tolerance Patient tolerated treatment well   Behavior During Therapy [facility] for tasks assessed/performed       Past Medical History:  Diagnosis Date  . Abnormality of gait   . Arthritis   . Bifascicular bundle branch block    chronic  . Degenerative spinal arthritis    cervical and lumbar  . Dementia    early onset  . Dyspnea on exertion   . GERD (gastroesophageal reflux disease)   . History of CVA (cerebrovascular accident)    03-31-2012--  right cerebellar infart without hemorrhgia  . History of gastric ulcer    2011  w/ bleed  . History of kidney stones   . History of squamous cell carcinoma excision   . Hyperlipidemia   . Hypertension   . Hypothyroidism   . Obstructive sleep apnea    has not been using -- but states has appointment soon to get refitted   . Polymyalgia rheumatica ([place])   . Stroke ([place])   . Type 2 diabetes mellitus ([place])   . Urge urinary incontinence    refactory    Past Surgical History:  Procedure Laterality Date  . APPENDECTOMY  as child  . HERNIA REPAIR  1956  . INTERSTIM IMPLANT PLACEMENT N/A 11/19/2014   Procedure: Barrie Lyme IMPLANT FIRST STAGE AND ;  Surgeon: Bjorn Loser, MD;  Location:  [facility];  Service: Urology;  Laterality: N/A;  . INTERSTIM IMPLANT PLACEMENT N/A 11/19/2014   Procedure: Barrie Lyme IMPLANT SECOND STAGE;  Surgeon: Bjorn Loser, MD;  Location: [facility];  Service: Urology;  Laterality: N/A;  . ORIF RIGHT WRIST FX  03-04-2004  . SHOULDER ARTHROSCOPY / DEBRIDEMENT/  SAD/  DCR/  MINI OPEN ROTATOR CUFF REPAIR Right 01-09-2003  . TEE WITHOUT CARDIOVERSION N/A 09/12/2012   Procedure: TRANSESOPHAGEAL ECHOCARDIOGRAM (TEE);  Surgeon: Lelon Perla, MD;  Location: [facility] ENDOSCOPY;  Service: Cardiovascular;  Laterality: N/A;  proximal septal thickening and chordal SAM creatin a narrow LVOT; turbulence noted  . TONSILLECTOMY  as child  . TOTAL KNEE ARTHROPLASTY Left 11-12-2004  . TRANSTHORACIC ECHOCARDIOGRAM  09-10-2012   mild LVH, mild  focal basal hypertrophy of the septum,  ef 10-25%, grade I diastolic dysfunction/  trival AR /  MV with moderate systolic anterior motion of the chordal structures/  mild LAE    There were no vitals filed for this visit.      Subjective Assessment - 12/21/16 1632    Subjective no new complaints, no falls to report, r knee pain today    Patient is accompained by: Family member   Pertinent History CVA 3 yrs ago:  several mini strokes per son's report; Type II  DM:  polymyalgia rheumatica: spinal arthritis:  early onset dementia:  Dyspnea on exertion   Limitations Lifting   Patient Stated Goals Improve mobility and balance   Currently in Pain? Yes   Pain Score 3    Pain Location Knee   Pain Orientation Right   Pain Descriptors / Indicators Aching;Sore;Discomfort   Pain Type Chronic pain   Pain Onset More than a month ago   Pain Frequency Constant   Aggravating Factors  activity   Pain Relieving Factors rest             [facility] PT Assessment - 12/21/16 0001      Timed Up and Go Test   TUG Normal TUG   Normal TUG (seconds) 20.78  with AD, 15.66 no AD   TUG Comments Pt supervision with AD  for safety and cue to lock breaks prior to sitting and standing. Pt requred supervision with no AD for safety.               [facility] Adult PT Treatment/Exercise - 12/21/16 0001      Ambulation/Gait   Ambulation/Gait Yes   Ambulation/Gait Assistance 5: Supervision   Ambulation/Gait Assistance Details Supervision for safety, cues to lock/unlock breaks with transfers from sit to stand/stand to sit.    Ambulation Distance (Feet) 234 Feet  1 seated rest break   Assistive device Rollator   Gait Pattern Step-through pattern;Decreased stride length;Narrow base of support   Ambulation Surface Level;Indoor     Self-Care   Self-Care Other Self-Care Comments   Other Self-Care Comments  Reviewed HEP and assited with completion of FOTO.                 PT Education - 12/21/16 1658    Education provided Yes   Education Details Reviewed HEP with pt and had pt complete FOTO survey   Person(s) Educated Patient   Methods Explanation;Demonstration   Comprehension Verbalized understanding;Returned demonstration          PT Short Term Goals - 12/21/16 1701      PT SHORT TERM GOAL #1   Title see note 10/01/16   Baseline 10/13/16: intermittently doing some of them.    Period Weeks   Status Partially Met     PT SHORT TERM GOAL #2   Title Pt will increase gait velocity from 1.37 ft/sec to 1.7 ft/sec with no device (as this is how assessed at eval per pt's request to perform amb. tests without cane).   10-01-16 TARGET DATE   Baseline 10/13/16: 1.56 ft/sec with rollator, 1.02 ft/sec with no AD. pt has increased gait speed from eval with use of rollator, however without an device her gait speed has declined since eval.    Time 4   Period Weeks   Status Partially Met     PT SHORT TERM GOAL #3   Title The patient will improve Berg from 34/56 up to 39/56 to demo decreasing risk for falls.    TARGET DATE 10-01-16   Baseline 04/15/16: pt scored 40/56 today   Period Weeks   Status Achieved      PT SHORT TERM GOAL #4   Title The patient will decrease TUG score from 26.53 seconds to < or equal to 22 seconds with no device to demo decreasing risk for falls.     10-01-16   Baseline 10/13/16: 24.47 sec's no AD, 23.37 sec's with straight cane (SPC), 24.03 sec's with rollator. pt has decreased TUG with all devices, just  not to goal with use of any of these devices.    Time 4   Period Weeks   Status Partially Met     PT SHORT TERM GOAL #5   Title Amb. 3" nonstop with RW to demo improved endurance/activity tolerance.   10-01-16   Baseline 10/28/16: met today   Time 4   Period Weeks   Status Achieved           PT Long Term Goals - January 04, 2017 1702      PT LONG TERM GOAL #1   Title The patient will improve gait speed from 1.37 ft/sec to >/= 1.65  ft/sec with rollator to demo improving mobility and lesser fall risk.   Target date 10-30-16 (NEW Target date for all LTGs 12-16-16)   Baseline 12/13/16: Pt increased gait speed to 1.70 ft/sec with rollator    Time 4   Period Weeks   Status Achieved     PT LONG TERM GOAL #2   Title achieved 9/8   Status Achieved     PT LONG TERM GOAL #3   Title The patient will negotiate 4 steps with one handrail and step-to pattern with SBA to demo improved lower extremity strength.  10-30-16   Baseline 12/13/16: Pt negotiated 4 steps with use of only 1 rail, step to pattern with supervision for safety   Status Achieved     PT LONG TERM GOAL #4   Title Pt will amb. 400' with rollator on flat even and uneven pavement for incr. community accessibility with SBA.  10-30-16   Baseline 10/28/16: pt max distance 325 feet with rollator, improved just not to goal. 2017-01-04: Pt ambulated 117' before requiring seated rest break.    Status Not Met     PT LONG TERM GOAL #5   Title The patient will decrease TUG score from 26.53 seconds to < or equal to 19 seconds with no device to demo decreasing risk for falls. Target date 10-30-16   Baseline 01-04-17: Pt scored 15.66 secs  with no AD, 20.78 with AD   Status Achieved               Plan - 01/04/2017 1706    Clinical Impression Statement Pt tolerated treatment well with no limitations due to pain, some limitation due to fatigue. Todays session focused on completing remaining long term goals, reviewing HEP and completing [place] for discharge.    Rehab Potential Good   Clinical Impairments Affecting Rehab Potential length of time since initial CVA (4 years);  early onset dementia; co-morbidities and lack of caregiver support during the day resulting in sedentary lifestyle   PT Frequency 2x / week   PT Duration 4 weeks  updated 11/16/16   PT Treatment/Interventions ADLs/Self Care Home Management;DME Instruction;Gait training;Stair training;Functional mobility training;Therapeutic activities;Therapeutic exercise;Balance training;Neuromuscular re-education;Patient/family education   PT Next Visit Plan Pt discharged during this session.    Consulted and Agree with Plan of Care Patient      Patient will benefit from skilled therapeutic intervention in order to improve the following deficits and impairments:  Abnormal gait, Decreased endurance, Decreased activity tolerance, Decreased balance, Decreased cognition, Decreased strength, Pain, Postural dysfunction, Decreased safety awareness  Visit Diagnosis: Muscle weakness (generalized)  Unsteadiness on feet  Other abnormalities of gait and mobility         G-Codes - 01-04-2017 1700    Functional Assessment Tool Used (Outpatient Only) Berg score 45/56:  TUG score 20.78 secs with rollator, 15.66 no AD:  Gait velocity 1.70 ft/sec with rollator   Functional Limitation Mobility: Walking and moving around      Problem List Patient Active Problem List   Diagnosis Date Noted  . TIA (transient ischemic attack) 09/08/2012  . CVA (cerebral infarction) 04/04/2012  . Cerebellar stroke, acute ([place]) 04/01/2012  . Ataxia following cerebral infarction 04/01/2012   . Nausea and vomiting 04/01/2012  . Polymyalgia rheumatica ([place]) 04/01/2012  . Bifascicular block   . Hypertension   . Hyperlipidemia   . Obstructive sleep apnea   . Diabetes mellitus ([place])    Alezandra Egli, SPTA  Lorretta [name] 12/21/2016, 5:10 PM  [facility] [address] [place], [place], [address] Phone: [phone]   Fax:  [phone]  Name: [name] MRN: [id number] Date of Birth: [birth year]  This note has been reviewed and edited by supervising CI. Added in g-code and notation on time in due to pt arriving late for appt today.  Willow Ora, PTA, [facility] [address], [place], [place] [address] [phone] 12/22/16, 9:13 AM    PHYSICAL THERAPY DISCHARGE SUMMARY  Visits from Start of Care: 13  Current functional level related to goals / functional outcomes: See above for progress towards goals   Remaining deficits: Pt continues to have decreased standing balance and dependency with gait with pt requiring use of rollator for assistance with amb.   Education / Equipment: Pt has been instructed in HEP for balance and LE strengthening exercises Plan: Patient agrees to discharge.  Patient goals were partially met. Patient is being discharged due to meeting the stated rehab goals.  ?????        Pt is discharged due to pt plateauing in maximizing functional progress at this time - end of this certification period.  Pt informed that she may be re-evaluated in the future to  Assess need for PT at that time.  Guido Sander, [facility] [address] [place], [place] [address] [phone] 12/22/16, 2:13 PM

## 2016-12-29 DIAGNOSIS — H34812 Central retinal vein occlusion, left eye, with macular edema: Secondary | ICD-10-CM | POA: Diagnosis not present

## 2017-01-26 DIAGNOSIS — H34812 Central retinal vein occlusion, left eye, with macular edema: Secondary | ICD-10-CM | POA: Diagnosis not present

## 2017-02-04 DIAGNOSIS — E1140 Type 2 diabetes mellitus with diabetic neuropathy, unspecified: Secondary | ICD-10-CM | POA: Diagnosis not present

## 2017-02-04 DIAGNOSIS — Z6835 Body mass index (BMI) 35.0-35.9, adult: Secondary | ICD-10-CM | POA: Diagnosis not present

## 2017-02-04 DIAGNOSIS — Z23 Encounter for immunization: Secondary | ICD-10-CM | POA: Diagnosis not present

## 2017-02-04 DIAGNOSIS — E7849 Other hyperlipidemia: Secondary | ICD-10-CM | POA: Diagnosis not present

## 2017-02-04 DIAGNOSIS — I10 Essential (primary) hypertension: Secondary | ICD-10-CM | POA: Diagnosis not present

## 2017-02-04 DIAGNOSIS — L03116 Cellulitis of left lower limb: Secondary | ICD-10-CM | POA: Diagnosis not present

## 2017-02-04 DIAGNOSIS — S80922A Unspecified superficial injury of left lower leg, initial encounter: Secondary | ICD-10-CM | POA: Diagnosis not present

## 2017-02-04 DIAGNOSIS — J449 Chronic obstructive pulmonary disease, unspecified: Secondary | ICD-10-CM | POA: Diagnosis not present

## 2017-02-10 DIAGNOSIS — Z6836 Body mass index (BMI) 36.0-36.9, adult: Secondary | ICD-10-CM | POA: Diagnosis not present

## 2017-02-10 DIAGNOSIS — H547 Unspecified visual loss: Secondary | ICD-10-CM | POA: Diagnosis not present

## 2017-02-10 DIAGNOSIS — N183 Chronic kidney disease, stage 3 (moderate): Secondary | ICD-10-CM | POA: Diagnosis not present

## 2017-02-10 DIAGNOSIS — L03116 Cellulitis of left lower limb: Secondary | ICD-10-CM | POA: Diagnosis not present

## 2017-02-10 DIAGNOSIS — R627 Adult failure to thrive: Secondary | ICD-10-CM | POA: Diagnosis not present

## 2017-02-10 DIAGNOSIS — Z0000 Encounter for general adult medical examination without abnormal findings: Secondary | ICD-10-CM | POA: Diagnosis not present

## 2017-02-10 DIAGNOSIS — J449 Chronic obstructive pulmonary disease, unspecified: Secondary | ICD-10-CM | POA: Diagnosis not present

## 2017-02-10 DIAGNOSIS — R454 Irritability and anger: Secondary | ICD-10-CM | POA: Diagnosis not present

## 2017-02-10 DIAGNOSIS — Z1389 Encounter for screening for other disorder: Secondary | ICD-10-CM | POA: Diagnosis not present

## 2017-02-10 DIAGNOSIS — I129 Hypertensive chronic kidney disease with stage 1 through stage 4 chronic kidney disease, or unspecified chronic kidney disease: Secondary | ICD-10-CM | POA: Diagnosis not present

## 2017-02-10 DIAGNOSIS — R82998 Other abnormal findings in urine: Secondary | ICD-10-CM | POA: Diagnosis not present

## 2017-02-10 DIAGNOSIS — R0609 Other forms of dyspnea: Secondary | ICD-10-CM | POA: Diagnosis not present

## 2017-02-10 DIAGNOSIS — E1140 Type 2 diabetes mellitus with diabetic neuropathy, unspecified: Secondary | ICD-10-CM | POA: Diagnosis not present

## 2017-02-24 DIAGNOSIS — H34812 Central retinal vein occlusion, left eye, with macular edema: Secondary | ICD-10-CM | POA: Diagnosis not present

## 2017-03-24 DIAGNOSIS — H43813 Vitreous degeneration, bilateral: Secondary | ICD-10-CM | POA: Diagnosis not present

## 2017-03-24 DIAGNOSIS — E119 Type 2 diabetes mellitus without complications: Secondary | ICD-10-CM | POA: Diagnosis not present

## 2017-03-24 DIAGNOSIS — H34812 Central retinal vein occlusion, left eye, with macular edema: Secondary | ICD-10-CM | POA: Diagnosis not present

## 2017-04-21 DIAGNOSIS — H34812 Central retinal vein occlusion, left eye, with macular edema: Secondary | ICD-10-CM | POA: Diagnosis not present

## 2017-04-27 DIAGNOSIS — M1711 Unilateral primary osteoarthritis, right knee: Secondary | ICD-10-CM | POA: Diagnosis not present

## 2017-04-27 DIAGNOSIS — Z96652 Presence of left artificial knee joint: Secondary | ICD-10-CM | POA: Diagnosis not present

## 2017-05-19 DIAGNOSIS — H34812 Central retinal vein occlusion, left eye, with macular edema: Secondary | ICD-10-CM | POA: Diagnosis not present

## 2017-06-07 ENCOUNTER — Other Ambulatory Visit: Payer: Self-pay | Admitting: Student

## 2017-06-22 DIAGNOSIS — H34812 Central retinal vein occlusion, left eye, with macular edema: Secondary | ICD-10-CM | POA: Diagnosis not present

## 2017-07-03 ENCOUNTER — Other Ambulatory Visit: Payer: Self-pay | Admitting: Student

## 2017-07-04 NOTE — Telephone Encounter (Signed)
Rx has been sent to the pharmacy electronically. ° °

## 2017-08-02 ENCOUNTER — Other Ambulatory Visit: Payer: Self-pay | Admitting: Cardiology

## 2017-08-03 DIAGNOSIS — H43813 Vitreous degeneration, bilateral: Secondary | ICD-10-CM | POA: Diagnosis not present

## 2017-08-03 DIAGNOSIS — H34812 Central retinal vein occlusion, left eye, with macular edema: Secondary | ICD-10-CM | POA: Diagnosis not present

## 2017-08-10 ENCOUNTER — Encounter (INDEPENDENT_AMBULATORY_CARE_PROVIDER_SITE_OTHER): Payer: Self-pay | Admitting: Ophthalmology

## 2017-08-15 ENCOUNTER — Encounter (INDEPENDENT_AMBULATORY_CARE_PROVIDER_SITE_OTHER): Payer: Self-pay | Admitting: Ophthalmology

## 2017-08-17 ENCOUNTER — Other Ambulatory Visit: Payer: Self-pay | Admitting: Cardiology

## 2017-08-24 ENCOUNTER — Encounter (INDEPENDENT_AMBULATORY_CARE_PROVIDER_SITE_OTHER): Payer: Medicare Other | Admitting: Ophthalmology

## 2017-08-24 DIAGNOSIS — H43813 Vitreous degeneration, bilateral: Secondary | ICD-10-CM

## 2017-08-24 DIAGNOSIS — H2513 Age-related nuclear cataract, bilateral: Secondary | ICD-10-CM

## 2017-08-24 DIAGNOSIS — H35033 Hypertensive retinopathy, bilateral: Secondary | ICD-10-CM

## 2017-08-24 DIAGNOSIS — H34812 Central retinal vein occlusion, left eye, with macular edema: Secondary | ICD-10-CM | POA: Diagnosis not present

## 2017-08-24 DIAGNOSIS — I10 Essential (primary) hypertension: Secondary | ICD-10-CM

## 2017-09-02 ENCOUNTER — Other Ambulatory Visit: Payer: Self-pay | Admitting: Cardiology

## 2017-09-06 ENCOUNTER — Ambulatory Visit (INDEPENDENT_AMBULATORY_CARE_PROVIDER_SITE_OTHER): Payer: Medicare Other | Admitting: Physician Assistant

## 2017-09-06 ENCOUNTER — Encounter: Payer: Self-pay | Admitting: Physician Assistant

## 2017-09-06 VITALS — BP 98/56 | HR 91 | Ht 64.0 in | Wt 204.0 lb

## 2017-09-06 DIAGNOSIS — E119 Type 2 diabetes mellitus without complications: Secondary | ICD-10-CM | POA: Diagnosis not present

## 2017-09-06 DIAGNOSIS — Z8673 Personal history of transient ischemic attack (TIA), and cerebral infarction without residual deficits: Secondary | ICD-10-CM

## 2017-09-06 DIAGNOSIS — H34812 Central retinal vein occlusion, left eye, with macular edema: Secondary | ICD-10-CM | POA: Diagnosis not present

## 2017-09-06 DIAGNOSIS — R0609 Other forms of dyspnea: Secondary | ICD-10-CM | POA: Diagnosis not present

## 2017-09-06 DIAGNOSIS — I10 Essential (primary) hypertension: Secondary | ICD-10-CM

## 2017-09-06 DIAGNOSIS — R0600 Dyspnea, unspecified: Secondary | ICD-10-CM

## 2017-09-06 DIAGNOSIS — E785 Hyperlipidemia, unspecified: Secondary | ICD-10-CM

## 2017-09-06 MED ORDER — IRBESARTAN 75 MG PO TABS: 75.0000 mg | ORAL_TABLET | Freq: Every day | ORAL | 6 refills | Status: DC

## 2017-09-06 NOTE — Progress Notes (Signed)
Cardiology Office Note    Date:  09/06/2017   ID:  [name] 1934-08-22, MRN [id number]  PCP:  Shon Baton, MD  Cardiologist:  Dr. Martinique  Chief Complaint  Patient presents with  . Follow-up    pt c/o SOB on exertion; no other Sx.    History of Present Illness:  [name] is a 83 y.o. female with PMH of HTN, HLD, DM II and prior CVA in 2014.  Otherwise, patient does not seem to have significant cardiac history.  She does have chronic bifascicular bundle branch block.  She was previously seen by Dr. Martinique in 2015 at which time she was doing well.  Her blood pressure was very well controlled.  She was last seen by Bernerd Pho, PA-C on 04/23/2016, at which time she was complaining of some worsening dyspnea on exertion.  EKG at that time did not show any ischemic changes.  Her O2 saturation remained stable as well.  Echocardiogram was recommended for outpatient evaluation, however this was not done.  Patient presents today to cardiology office visit along with her son.  Her blood pressure is borderline low today at 98/56 however patient denies any dizziness, blurred vision or feeling of passing out.  For some reason both benazepril and irbesartan listed on her medication list.  According to her son, he believes the patient is taking the irbesartan but not taking the benazepril.  He will confirm once he go back home to check her medication list.  I will remove benazepril from her medication list.  She will need to monitor her daily blood pressure, if systolic blood pressure consistently in the 90s, I would recommend come off irbesartan as well.  Otherwise she continue to have some worsening dyspnea on exertion.  She denies any chest pain but she says that she would become short of breath walking only a short distance.  This has been progressively worsening for the past 6 months.  She also mentions, due to her bad knee, she has not been able to ambulate very frequently.  I did not  see any lower extremity edema or ipsilateral edema on exam.  I recommended repeat echocardiogram.  However if patient symptom worsens, I would have very low threshold to order CT angiogram of the chest to rule out PE.  However this would be fairly unusual presentation for PE as her symptom has been gradually worsening without obvious sudden changes.   Past Medical History:  Diagnosis Date  . Abnormality of gait   . Arthritis   . Bifascicular bundle branch block    chronic  . Degenerative spinal arthritis    cervical and lumbar  . Dementia    early onset  . Dyspnea on exertion   . GERD (gastroesophageal reflux disease)   . History of CVA (cerebrovascular accident)    03-31-2012--  right cerebellar infart without hemorrhgia  . History of gastric ulcer    2011  w/ bleed  . History of kidney stones   . History of squamous cell carcinoma excision   . Hyperlipidemia   . Hypertension   . Hypothyroidism   . Obstructive sleep apnea    has not been using -- but states has appointment soon to get refitted   . Polymyalgia rheumatica ([place])   . Stroke ([place])   . Type 2 diabetes mellitus ([place])   . Urge urinary incontinence    refactory    Past Surgical History:  Procedure Laterality Date  . APPENDECTOMY  as child  . HERNIA REPAIR  1956  . INTERSTIM IMPLANT PLACEMENT N/A 11/19/2014   Procedure: Barrie Lyme IMPLANT FIRST STAGE AND ;  Surgeon: Bjorn Loser, MD;  [facility]: [facility];  Service: Urology;  Laterality: N/A;  . INTERSTIM IMPLANT PLACEMENT N/A 11/19/2014   Procedure: Barrie Lyme IMPLANT SECOND STAGE;  Surgeon: Bjorn Loser, MD;  Location: [facility];  Service: Urology;  Laterality: N/A;  . ORIF RIGHT WRIST FX  03-04-2004  . SHOULDER ARTHROSCOPY / DEBRIDEMENT/  SAD/  DCR/  MINI OPEN ROTATOR CUFF REPAIR Right 01-09-2003  . TEE WITHOUT CARDIOVERSION N/A 09/12/2012   Procedure: TRANSESOPHAGEAL ECHOCARDIOGRAM (TEE);  Surgeon: Lelon Perla, MD;   Location: [facility] ENDOSCOPY;  Service: Cardiovascular;  Laterality: N/A;  proximal septal thickening and chordal SAM creatin a narrow LVOT; turbulence noted  . TONSILLECTOMY  as child  . TOTAL KNEE ARTHROPLASTY Left 11-12-2004  . TRANSTHORACIC ECHOCARDIOGRAM  09-10-2012   mild LVH, mild  focal basal hypertrophy of the septum,  ef 61-60%, grade I diastolic dysfunction/  trival AR /  MV with moderate systolic anterior motion of the chordal structures/  mild LAE    Current Medications: Outpatient Medications Prior to Visit  Medication Sig Dispense Refill  . acetaminophen (TYLENOL) 325 MG tablet Take 650 mg by mouth every 6 (six) hours as needed.    Marland Kitchen atorvastatin (LIPITOR) 40 MG tablet Take 1 tablet (40 mg total) by mouth daily at 6 PM. Keep upcoming visit for further refills, thanks. 15 tablet 0  . Cholecalciferol 5000 UNITS capsule Take 1 capsule (5,000 Units total) by mouth daily. (Patient taking differently: Take 5,000 Units by mouth every morning. ) 30 capsule 1  . clopidogrel (PLAVIX) 75 MG tablet Take 1 tablet (75 mg total) by mouth daily with breakfast. 30 tablet 0  . donepezil (ARICEPT) 5 MG tablet Take 1 tablet (5 mg total) by mouth at bedtime. 30 tablet 1  . DULoxetine (CYMBALTA) 60 MG capsule Take 60 mg by mouth every morning.    . Fluticasone Furoate-Vilanterol (BREO ELLIPTA) 100-25 MCG/INH AEPB Inhale into the lungs every evening.     . folic acid (FOLVITE) 1 MG tablet Take 1 tablet (1 mg total) by mouth 2 (two) times daily. 60 tablet 1  . glimepiride (AMARYL) 1 MG tablet Take 1 mg by mouth daily with breakfast.    . lamoTRIgine (LAMICTAL) 150 MG tablet Take 150 mg by mouth every morning.    Marland Kitchen levothyroxine (SYNTHROID, LEVOTHROID) 100 MCG tablet Take 1 tablet (100 mcg total) by mouth daily. 30 tablet 1  . Lurasidone HCl (LATUDA) 20 MG TABS Take 1 tablet by mouth every evening.    . vitamin B-12 (CYANOCOBALAMIN) 1000 MCG tablet Take 1 tablet (1,000 mcg total) by mouth daily. (Patient  taking differently: Take 1,000 mcg by mouth every morning. ) 30 tablet 1  . benazepril (LOTENSIN) 40 MG tablet Take 40 mg by mouth daily.    . irbesartan (AVAPRO) 75 MG tablet Take 1 tablet by mouth daily.     No facility-administered medications prior to visit.      Allergies:   Adhesive [tape]   Social History   Socioeconomic History  . Marital status: Widowed    Spouse name: Not on file  . Number of children: Not on file  . Years of education: Not on file  . Highest education level: Not on file  Occupational History  . Not on file  Social Needs  . Financial resource strain: Not  on file  . Food insecurity:    Worry: Not on file    Inability: Not on file  . Transportation needs:    Medical: Not on file    Non-medical: Not on file  Tobacco Use  . Smoking status: Former Smoker    Last attempt to quit: 11/02/2004    Years since quitting: 12.8  . Smokeless tobacco: Never Used  Substance and Sexual Activity  . Alcohol use: No  . Drug use: No  . Sexual activity: Not on file  Lifestyle  . Physical activity:    Days per week: Not on file    Minutes per session: Not on file  . Stress: Not on file  Relationships  . Social connections:    Talks on phone: Not on file    Gets together: Not on file    Attends religious service: Not on file    Active member of club or organization: Not on file    Attends meetings of clubs or organizations: Not on file    Relationship status: Not on file  Other Topics Concern  . Not on file  Social History Narrative  . Not on file     Family History:  The patient's family history includes Heart failure in her mother.   ROS:   Please see the history of present illness.    ROS All other systems reviewed and are negative.   PHYSICAL EXAM:   VS:  BP (!) 98/56   Pulse 91   Ht 5\' 4"  (1.626 m)   Wt 204 lb (92.5 kg)   BMI 35.02 kg/m    GEN: Well nourished, well developed, in no acute distress  HEENT: normal  Neck: no JVD, carotid  bruits, or masses Cardiac: RRR; no murmurs, rubs, or gallops,no edema  Respiratory:  clear to auscultation bilaterally, normal work of breathing GI: soft, nontender, nondistended, + BS MS: no deformity or atrophy  Skin: warm and dry, no rash Neuro:  Alert and Oriented x 3, Strength and sensation are intact Psych: euthymic mood, full affect  Wt Readings from Last 3 Encounters:  09/06/17 204 lb (92.5 kg)  04/23/16 199 lb 9.6 oz (90.5 kg)  11/29/14 203 lb (92.1 kg)      Studies/Labs Reviewed:   EKG:  EKG is ordered today.  The ekg ordered today demonstrates sinus rhythm, first-degree AV block, bifascicular block.  Unchanged when compared to previous EKG.  Recent Labs: No results found for requested labs within last 8760 hours.   Lipid Panel    Component Value Date/Time   CHOL 122 09/09/2012 0711   TRIG 112 09/09/2012 0711   HDL 47 09/09/2012 0711   CHOLHDL 2.6 09/09/2012 0711   VLDL 22 09/09/2012 0711   LDLCALC 53 09/09/2012 0711    Additional studies/ records that were reviewed today include:   Echo 09/12/2012 LV EF: 60% -  65% Study Conclusions  - Left ventricle: There was moderate focal basal hypertrophy of the septum. Systolic function was normal. The estimated ejection fraction was in the range of 60% to 65%. Wall motion was normal; there were no regional wall motion abnormalities. - Aortic valve: Trivial regurgitation. - Mitral valve: There was moderate systolic anterior motion of the chordal structures. - Left atrium: The atrium was mildly dilated. No evidence of thrombus in the atrial cavity or appendage. - Atrial septum: No defect or patent foramen ovale was identified. Echo contrast study showed no right-to-left atrial level shunt, following an increase in RA pressure  induced by provocative maneuvers. - Tricuspid valve: No evidence of vegetation. Impressions:  - There is proximal septal thickening and chordal SAM creating a narrow  LVOT; turbulence in LVOT noted; suggest transthoracic echo to better assess gradient if clinically indicated.   ASSESSMENT:    1. Dyspnea on exertion   2. Essential hypertension   3. Hyperlipidemia, unspecified hyperlipidemia type   4. Controlled type 2 diabetes mellitus without complication, without long-term current use of insulin ([place])   5. H/O: CVA (cerebrovascular accident)      PLAN:  In order of problems listed above:  1. Dyspnea on exertion: This is the same issue she was seen over a year ago, echocardiogram was recommended at the time but not done.  Her son also noticed gradually worsening dyspnea on exertion for the past 6 months as well.  I would recommend repeat echocardiogram.  Her dyspnea on exertion seems to be more gradual than sudden, not seeing any lower extremity edema, however if echocardiogram shows significantly dilated RV, will need to rule out PE.  2. Hypertension: Blood pressure borderline low, however patient denies any dizziness, blurred vision or feeling of passing out.  She is on both benazepril and irbesartan, however her son believes she is only taking irbesartan.  He will confirm once he gets home to make sure she is not on benazepril.  She will need to a daily BP monitoring.   3. Hyperlipidemia: Continue Lipitor 40 mg daily.  Annual lipid panel followed by primary care provider  4. DM2: Managed by primary care provider.  5. History of CVA: No recurrence since 2014.   Medication Adjustments/Labs and Tests Ordered: Current medicines are reviewed at length with the patient today.  Concerns regarding medicines are outlined above.  Medication changes, Labs and Tests ordered today are listed in the Patient Instructions below. Patient Instructions  Medication Instructions:  DISCONTINUE Benazpril Please confirm meds that patient is not taking  Labwork: None   Testing/Procedures: Your physician has requested that you have an echocardiogram.  Echocardiography is a painless test that uses sound waves to create images of your heart. It provides your doctor with information about the size and shape of your heart and how well your heart's chambers and valves are working. This procedure takes approximately one hour. There are no restrictions for this procedure. [place]  Follow-Up: Your physician wants you to follow-up in: 6 months with Dr Martinique. You will receive a reminder letter in the mail two months in advance. If you don't receive a letter, please call our office to schedule the follow-up appointment.  Any Other Special Instructions Will Be Listed Below (If Applicable). 1. Monitor your blood pressure at home   If you need a refill on your cardiac medications before your next appointment, please call your pharmacy.     [name], [place]  09/06/2017 1:26 PM    [facility] [place], [place], [place]  [address] Phone: [phone]; Fax: [phone]

## 2017-09-06 NOTE — Patient Instructions (Addendum)
Medication Instructions:  DISCONTINUE Benazpril Please confirm meds that patient is not taking  Labwork: None   Testing/Procedures: Your physician has requested that you have an echocardiogram. Echocardiography is a painless test that uses sound waves to create images of your heart. It provides your doctor with information about the size and shape of your heart and how well your heart's chambers and valves are working. This procedure takes approximately one hour. There are no restrictions for this procedure. [place]  Follow-Up: Your physician wants you to follow-up in: 6 months with Dr Martinique. You will receive a reminder letter in the mail two months in advance. If you don't receive a letter, please call our office to schedule the follow-up appointment.  Any Other Special Instructions Will Be Listed Below (If Applicable). 1. Monitor your blood pressure at home   If you need a refill on your cardiac medications before your next appointment, please call your pharmacy.

## 2017-09-07 DIAGNOSIS — M1711 Unilateral primary osteoarthritis, right knee: Secondary | ICD-10-CM | POA: Diagnosis not present

## 2017-09-07 DIAGNOSIS — M25561 Pain in right knee: Secondary | ICD-10-CM | POA: Diagnosis not present

## 2017-09-26 DIAGNOSIS — H34812 Central retinal vein occlusion, left eye, with macular edema: Secondary | ICD-10-CM | POA: Diagnosis not present

## 2017-09-26 DIAGNOSIS — H524 Presbyopia: Secondary | ICD-10-CM | POA: Diagnosis not present

## 2017-09-26 DIAGNOSIS — E119 Type 2 diabetes mellitus without complications: Secondary | ICD-10-CM | POA: Diagnosis not present

## 2017-09-29 ENCOUNTER — Ambulatory Visit (HOSPITAL_COMMUNITY): Payer: Medicare Other | Attending: Cardiovascular Disease

## 2017-09-29 ENCOUNTER — Other Ambulatory Visit: Payer: Self-pay

## 2017-09-29 DIAGNOSIS — E669 Obesity, unspecified: Secondary | ICD-10-CM | POA: Insufficient documentation

## 2017-09-29 DIAGNOSIS — I119 Hypertensive heart disease without heart failure: Secondary | ICD-10-CM | POA: Diagnosis not present

## 2017-09-29 DIAGNOSIS — R0609 Other forms of dyspnea: Secondary | ICD-10-CM | POA: Diagnosis not present

## 2017-09-29 DIAGNOSIS — I340 Nonrheumatic mitral (valve) insufficiency: Secondary | ICD-10-CM | POA: Insufficient documentation

## 2017-09-29 DIAGNOSIS — G4733 Obstructive sleep apnea (adult) (pediatric): Secondary | ICD-10-CM | POA: Diagnosis not present

## 2017-09-29 DIAGNOSIS — R0600 Dyspnea, unspecified: Secondary | ICD-10-CM

## 2017-09-29 DIAGNOSIS — E119 Type 2 diabetes mellitus without complications: Secondary | ICD-10-CM | POA: Diagnosis not present

## 2017-09-29 DIAGNOSIS — Z8673 Personal history of transient ischemic attack (TIA), and cerebral infarction without residual deficits: Secondary | ICD-10-CM | POA: Insufficient documentation

## 2017-09-29 DIAGNOSIS — Z6835 Body mass index (BMI) 35.0-35.9, adult: Secondary | ICD-10-CM | POA: Diagnosis not present

## 2017-09-29 DIAGNOSIS — E785 Hyperlipidemia, unspecified: Secondary | ICD-10-CM | POA: Insufficient documentation

## 2017-09-29 DIAGNOSIS — E039 Hypothyroidism, unspecified: Secondary | ICD-10-CM | POA: Diagnosis not present

## 2017-10-10 DIAGNOSIS — J449 Chronic obstructive pulmonary disease, unspecified: Secondary | ICD-10-CM | POA: Diagnosis not present

## 2017-10-10 DIAGNOSIS — R627 Adult failure to thrive: Secondary | ICD-10-CM | POA: Diagnosis not present

## 2017-10-10 DIAGNOSIS — E7849 Other hyperlipidemia: Secondary | ICD-10-CM | POA: Diagnosis not present

## 2017-10-10 DIAGNOSIS — E038 Other specified hypothyroidism: Secondary | ICD-10-CM | POA: Diagnosis not present

## 2017-10-10 DIAGNOSIS — I517 Cardiomegaly: Secondary | ICD-10-CM | POA: Diagnosis not present

## 2017-10-10 DIAGNOSIS — E1140 Type 2 diabetes mellitus with diabetic neuropathy, unspecified: Secondary | ICD-10-CM | POA: Diagnosis not present

## 2017-10-10 DIAGNOSIS — D692 Other nonthrombocytopenic purpura: Secondary | ICD-10-CM | POA: Diagnosis not present

## 2017-10-10 DIAGNOSIS — R0609 Other forms of dyspnea: Secondary | ICD-10-CM | POA: Diagnosis not present

## 2017-10-10 DIAGNOSIS — Z6835 Body mass index (BMI) 35.0-35.9, adult: Secondary | ICD-10-CM | POA: Diagnosis not present

## 2017-10-10 DIAGNOSIS — I129 Hypertensive chronic kidney disease with stage 1 through stage 4 chronic kidney disease, or unspecified chronic kidney disease: Secondary | ICD-10-CM | POA: Diagnosis not present

## 2017-10-10 DIAGNOSIS — N183 Chronic kidney disease, stage 3 (moderate): Secondary | ICD-10-CM | POA: Diagnosis not present

## 2017-10-11 DIAGNOSIS — H34812 Central retinal vein occlusion, left eye, with macular edema: Secondary | ICD-10-CM | POA: Diagnosis not present

## 2017-10-25 DIAGNOSIS — H2512 Age-related nuclear cataract, left eye: Secondary | ICD-10-CM | POA: Diagnosis not present

## 2017-10-25 DIAGNOSIS — H25812 Combined forms of age-related cataract, left eye: Secondary | ICD-10-CM | POA: Diagnosis not present

## 2017-11-22 DIAGNOSIS — H34812 Central retinal vein occlusion, left eye, with macular edema: Secondary | ICD-10-CM | POA: Diagnosis not present

## 2017-11-29 DIAGNOSIS — H2511 Age-related nuclear cataract, right eye: Secondary | ICD-10-CM | POA: Diagnosis not present

## 2017-11-29 DIAGNOSIS — H25811 Combined forms of age-related cataract, right eye: Secondary | ICD-10-CM | POA: Diagnosis not present

## 2017-12-20 DIAGNOSIS — H34812 Central retinal vein occlusion, left eye, with macular edema: Secondary | ICD-10-CM | POA: Diagnosis not present

## 2018-01-24 DIAGNOSIS — E1140 Type 2 diabetes mellitus with diabetic neuropathy, unspecified: Secondary | ICD-10-CM | POA: Diagnosis not present

## 2018-01-24 DIAGNOSIS — J449 Chronic obstructive pulmonary disease, unspecified: Secondary | ICD-10-CM | POA: Diagnosis not present

## 2018-01-24 DIAGNOSIS — G4733 Obstructive sleep apnea (adult) (pediatric): Secondary | ICD-10-CM | POA: Diagnosis not present

## 2018-01-24 DIAGNOSIS — R0609 Other forms of dyspnea: Secondary | ICD-10-CM | POA: Diagnosis not present

## 2018-01-24 DIAGNOSIS — N183 Chronic kidney disease, stage 3 (moderate): Secondary | ICD-10-CM | POA: Diagnosis not present

## 2018-01-24 DIAGNOSIS — I10 Essential (primary) hypertension: Secondary | ICD-10-CM | POA: Diagnosis not present

## 2018-01-24 DIAGNOSIS — J189 Pneumonia, unspecified organism: Secondary | ICD-10-CM | POA: Diagnosis not present

## 2018-01-30 DIAGNOSIS — J449 Chronic obstructive pulmonary disease, unspecified: Secondary | ICD-10-CM | POA: Diagnosis not present

## 2018-01-30 DIAGNOSIS — Z6836 Body mass index (BMI) 36.0-36.9, adult: Secondary | ICD-10-CM | POA: Diagnosis not present

## 2018-01-30 DIAGNOSIS — J189 Pneumonia, unspecified organism: Secondary | ICD-10-CM | POA: Diagnosis not present

## 2018-01-30 DIAGNOSIS — R0609 Other forms of dyspnea: Secondary | ICD-10-CM | POA: Diagnosis not present

## 2018-02-06 DIAGNOSIS — E538 Deficiency of other specified B group vitamins: Secondary | ICD-10-CM | POA: Diagnosis not present

## 2018-02-06 DIAGNOSIS — R0600 Dyspnea, unspecified: Secondary | ICD-10-CM | POA: Diagnosis not present

## 2018-02-06 DIAGNOSIS — E1140 Type 2 diabetes mellitus with diabetic neuropathy, unspecified: Secondary | ICD-10-CM | POA: Diagnosis not present

## 2018-02-06 DIAGNOSIS — M810 Age-related osteoporosis without current pathological fracture: Secondary | ICD-10-CM | POA: Diagnosis not present

## 2018-02-06 DIAGNOSIS — E038 Other specified hypothyroidism: Secondary | ICD-10-CM | POA: Diagnosis not present

## 2018-02-06 DIAGNOSIS — Z0000 Encounter for general adult medical examination without abnormal findings: Secondary | ICD-10-CM | POA: Diagnosis not present

## 2018-02-06 DIAGNOSIS — I517 Cardiomegaly: Secondary | ICD-10-CM | POA: Diagnosis not present

## 2018-02-06 DIAGNOSIS — R82998 Other abnormal findings in urine: Secondary | ICD-10-CM | POA: Diagnosis not present

## 2018-02-13 DIAGNOSIS — I129 Hypertensive chronic kidney disease with stage 1 through stage 4 chronic kidney disease, or unspecified chronic kidney disease: Secondary | ICD-10-CM | POA: Diagnosis not present

## 2018-02-13 DIAGNOSIS — Z23 Encounter for immunization: Secondary | ICD-10-CM | POA: Diagnosis not present

## 2018-02-13 DIAGNOSIS — R32 Unspecified urinary incontinence: Secondary | ICD-10-CM | POA: Diagnosis not present

## 2018-02-13 DIAGNOSIS — N183 Chronic kidney disease, stage 3 (moderate): Secondary | ICD-10-CM | POA: Diagnosis not present

## 2018-02-13 DIAGNOSIS — J449 Chronic obstructive pulmonary disease, unspecified: Secondary | ICD-10-CM | POA: Diagnosis not present

## 2018-02-13 DIAGNOSIS — Z6835 Body mass index (BMI) 35.0-35.9, adult: Secondary | ICD-10-CM | POA: Diagnosis not present

## 2018-02-13 DIAGNOSIS — Z1389 Encounter for screening for other disorder: Secondary | ICD-10-CM | POA: Diagnosis not present

## 2018-02-13 DIAGNOSIS — H269 Unspecified cataract: Secondary | ICD-10-CM | POA: Diagnosis not present

## 2018-02-13 DIAGNOSIS — E1140 Type 2 diabetes mellitus with diabetic neuropathy, unspecified: Secondary | ICD-10-CM | POA: Diagnosis not present

## 2018-02-13 DIAGNOSIS — I517 Cardiomegaly: Secondary | ICD-10-CM | POA: Diagnosis not present

## 2018-02-13 DIAGNOSIS — D692 Other nonthrombocytopenic purpura: Secondary | ICD-10-CM | POA: Diagnosis not present

## 2018-02-13 DIAGNOSIS — R627 Adult failure to thrive: Secondary | ICD-10-CM | POA: Diagnosis not present

## 2018-02-13 DIAGNOSIS — Z0000 Encounter for general adult medical examination without abnormal findings: Secondary | ICD-10-CM | POA: Diagnosis not present

## 2018-03-01 ENCOUNTER — Encounter (INDEPENDENT_AMBULATORY_CARE_PROVIDER_SITE_OTHER): Payer: Medicare Other | Admitting: Ophthalmology

## 2018-03-01 DIAGNOSIS — H43813 Vitreous degeneration, bilateral: Secondary | ICD-10-CM | POA: Diagnosis not present

## 2018-03-01 DIAGNOSIS — I10 Essential (primary) hypertension: Secondary | ICD-10-CM

## 2018-03-01 DIAGNOSIS — H34812 Central retinal vein occlusion, left eye, with macular edema: Secondary | ICD-10-CM | POA: Diagnosis not present

## 2018-03-01 DIAGNOSIS — H35033 Hypertensive retinopathy, bilateral: Secondary | ICD-10-CM

## 2018-03-29 ENCOUNTER — Encounter (INDEPENDENT_AMBULATORY_CARE_PROVIDER_SITE_OTHER): Payer: Medicare Other | Admitting: Ophthalmology

## 2018-03-29 DIAGNOSIS — H43813 Vitreous degeneration, bilateral: Secondary | ICD-10-CM | POA: Diagnosis not present

## 2018-03-29 DIAGNOSIS — E11319 Type 2 diabetes mellitus with unspecified diabetic retinopathy without macular edema: Secondary | ICD-10-CM

## 2018-03-29 DIAGNOSIS — I10 Essential (primary) hypertension: Secondary | ICD-10-CM | POA: Diagnosis not present

## 2018-03-29 DIAGNOSIS — H35033 Hypertensive retinopathy, bilateral: Secondary | ICD-10-CM

## 2018-03-29 DIAGNOSIS — H34812 Central retinal vein occlusion, left eye, with macular edema: Secondary | ICD-10-CM | POA: Diagnosis not present

## 2018-03-29 DIAGNOSIS — E113291 Type 2 diabetes mellitus with mild nonproliferative diabetic retinopathy without macular edema, right eye: Secondary | ICD-10-CM | POA: Diagnosis not present

## 2018-04-12 DIAGNOSIS — M1711 Unilateral primary osteoarthritis, right knee: Secondary | ICD-10-CM | POA: Diagnosis not present

## 2018-04-12 DIAGNOSIS — M25561 Pain in right knee: Secondary | ICD-10-CM | POA: Diagnosis not present

## 2018-04-21 DIAGNOSIS — I129 Hypertensive chronic kidney disease with stage 1 through stage 4 chronic kidney disease, or unspecified chronic kidney disease: Secondary | ICD-10-CM | POA: Diagnosis not present

## 2018-04-21 DIAGNOSIS — Z6835 Body mass index (BMI) 35.0-35.9, adult: Secondary | ICD-10-CM | POA: Diagnosis not present

## 2018-04-21 DIAGNOSIS — N183 Chronic kidney disease, stage 3 (moderate): Secondary | ICD-10-CM | POA: Diagnosis not present

## 2018-04-21 DIAGNOSIS — B349 Viral infection, unspecified: Secondary | ICD-10-CM | POA: Diagnosis not present

## 2018-04-21 DIAGNOSIS — J029 Acute pharyngitis, unspecified: Secondary | ICD-10-CM | POA: Diagnosis not present

## 2018-04-25 ENCOUNTER — Encounter (INDEPENDENT_AMBULATORY_CARE_PROVIDER_SITE_OTHER): Payer: Medicare Other | Admitting: Ophthalmology

## 2018-05-03 ENCOUNTER — Encounter (INDEPENDENT_AMBULATORY_CARE_PROVIDER_SITE_OTHER): Payer: Medicare Other | Admitting: Ophthalmology

## 2018-05-04 ENCOUNTER — Encounter (INDEPENDENT_AMBULATORY_CARE_PROVIDER_SITE_OTHER): Payer: Medicare Other | Admitting: Ophthalmology

## 2018-05-04 ENCOUNTER — Other Ambulatory Visit: Payer: Self-pay

## 2018-05-04 DIAGNOSIS — I10 Essential (primary) hypertension: Secondary | ICD-10-CM

## 2018-05-04 DIAGNOSIS — E11319 Type 2 diabetes mellitus with unspecified diabetic retinopathy without macular edema: Secondary | ICD-10-CM | POA: Diagnosis not present

## 2018-05-04 DIAGNOSIS — H35033 Hypertensive retinopathy, bilateral: Secondary | ICD-10-CM | POA: Diagnosis not present

## 2018-05-04 DIAGNOSIS — E113291 Type 2 diabetes mellitus with mild nonproliferative diabetic retinopathy without macular edema, right eye: Secondary | ICD-10-CM

## 2018-05-04 DIAGNOSIS — H34812 Central retinal vein occlusion, left eye, with macular edema: Secondary | ICD-10-CM

## 2018-05-04 DIAGNOSIS — H43813 Vitreous degeneration, bilateral: Secondary | ICD-10-CM

## 2018-06-09 ENCOUNTER — Encounter (INDEPENDENT_AMBULATORY_CARE_PROVIDER_SITE_OTHER): Payer: Medicare Other | Admitting: Ophthalmology

## 2018-06-16 ENCOUNTER — Encounter (INDEPENDENT_AMBULATORY_CARE_PROVIDER_SITE_OTHER): Payer: Medicare Other | Admitting: Ophthalmology

## 2018-06-22 DIAGNOSIS — H547 Unspecified visual loss: Secondary | ICD-10-CM | POA: Diagnosis not present

## 2018-06-22 DIAGNOSIS — R1310 Dysphagia, unspecified: Secondary | ICD-10-CM | POA: Diagnosis not present

## 2018-06-22 DIAGNOSIS — E1140 Type 2 diabetes mellitus with diabetic neuropathy, unspecified: Secondary | ICD-10-CM | POA: Diagnosis not present

## 2018-06-22 DIAGNOSIS — N183 Chronic kidney disease, stage 3 (moderate): Secondary | ICD-10-CM | POA: Diagnosis not present

## 2018-06-22 DIAGNOSIS — R413 Other amnesia: Secondary | ICD-10-CM | POA: Diagnosis not present

## 2018-06-22 DIAGNOSIS — F3341 Major depressive disorder, recurrent, in partial remission: Secondary | ICD-10-CM | POA: Diagnosis not present

## 2018-06-22 DIAGNOSIS — I129 Hypertensive chronic kidney disease with stage 1 through stage 4 chronic kidney disease, or unspecified chronic kidney disease: Secondary | ICD-10-CM | POA: Diagnosis not present

## 2018-06-22 DIAGNOSIS — J449 Chronic obstructive pulmonary disease, unspecified: Secondary | ICD-10-CM | POA: Diagnosis not present

## 2018-06-22 DIAGNOSIS — Z1331 Encounter for screening for depression: Secondary | ICD-10-CM | POA: Diagnosis not present

## 2018-06-22 DIAGNOSIS — R269 Unspecified abnormalities of gait and mobility: Secondary | ICD-10-CM | POA: Diagnosis not present

## 2018-06-28 ENCOUNTER — Encounter (INDEPENDENT_AMBULATORY_CARE_PROVIDER_SITE_OTHER): Payer: Medicare Other | Admitting: Ophthalmology

## 2018-06-28 ENCOUNTER — Other Ambulatory Visit: Payer: Self-pay

## 2018-06-28 DIAGNOSIS — E113311 Type 2 diabetes mellitus with moderate nonproliferative diabetic retinopathy with macular edema, right eye: Secondary | ICD-10-CM

## 2018-06-28 DIAGNOSIS — H43813 Vitreous degeneration, bilateral: Secondary | ICD-10-CM

## 2018-06-28 DIAGNOSIS — H34812 Central retinal vein occlusion, left eye, with macular edema: Secondary | ICD-10-CM

## 2018-06-28 DIAGNOSIS — I10 Essential (primary) hypertension: Secondary | ICD-10-CM | POA: Diagnosis not present

## 2018-06-28 DIAGNOSIS — H35033 Hypertensive retinopathy, bilateral: Secondary | ICD-10-CM

## 2018-06-28 DIAGNOSIS — E11311 Type 2 diabetes mellitus with unspecified diabetic retinopathy with macular edema: Secondary | ICD-10-CM | POA: Diagnosis not present

## 2018-07-27 ENCOUNTER — Encounter (INDEPENDENT_AMBULATORY_CARE_PROVIDER_SITE_OTHER): Payer: Medicare Other | Admitting: Ophthalmology

## 2018-07-27 ENCOUNTER — Other Ambulatory Visit: Payer: Self-pay

## 2018-07-27 DIAGNOSIS — H34812 Central retinal vein occlusion, left eye, with macular edema: Secondary | ICD-10-CM | POA: Diagnosis not present

## 2018-07-27 DIAGNOSIS — H35033 Hypertensive retinopathy, bilateral: Secondary | ICD-10-CM | POA: Diagnosis not present

## 2018-07-27 DIAGNOSIS — E113311 Type 2 diabetes mellitus with moderate nonproliferative diabetic retinopathy with macular edema, right eye: Secondary | ICD-10-CM | POA: Diagnosis not present

## 2018-07-27 DIAGNOSIS — H43813 Vitreous degeneration, bilateral: Secondary | ICD-10-CM

## 2018-07-27 DIAGNOSIS — I10 Essential (primary) hypertension: Secondary | ICD-10-CM | POA: Diagnosis not present

## 2018-07-27 DIAGNOSIS — E11311 Type 2 diabetes mellitus with unspecified diabetic retinopathy with macular edema: Secondary | ICD-10-CM

## 2018-08-24 ENCOUNTER — Encounter (INDEPENDENT_AMBULATORY_CARE_PROVIDER_SITE_OTHER): Payer: Medicare Other | Admitting: Ophthalmology

## 2018-08-24 ENCOUNTER — Other Ambulatory Visit: Payer: Self-pay

## 2018-08-24 DIAGNOSIS — H43813 Vitreous degeneration, bilateral: Secondary | ICD-10-CM | POA: Diagnosis not present

## 2018-08-24 DIAGNOSIS — E11311 Type 2 diabetes mellitus with unspecified diabetic retinopathy with macular edema: Secondary | ICD-10-CM | POA: Diagnosis not present

## 2018-08-24 DIAGNOSIS — H35033 Hypertensive retinopathy, bilateral: Secondary | ICD-10-CM

## 2018-08-24 DIAGNOSIS — E113311 Type 2 diabetes mellitus with moderate nonproliferative diabetic retinopathy with macular edema, right eye: Secondary | ICD-10-CM

## 2018-08-24 DIAGNOSIS — I10 Essential (primary) hypertension: Secondary | ICD-10-CM | POA: Diagnosis not present

## 2018-08-24 DIAGNOSIS — H34812 Central retinal vein occlusion, left eye, with macular edema: Secondary | ICD-10-CM

## 2018-09-28 ENCOUNTER — Encounter (INDEPENDENT_AMBULATORY_CARE_PROVIDER_SITE_OTHER): Payer: Medicare Other | Admitting: Ophthalmology

## 2018-09-28 ENCOUNTER — Other Ambulatory Visit: Payer: Self-pay

## 2018-09-28 DIAGNOSIS — H35033 Hypertensive retinopathy, bilateral: Secondary | ICD-10-CM

## 2018-09-28 DIAGNOSIS — H34812 Central retinal vein occlusion, left eye, with macular edema: Secondary | ICD-10-CM | POA: Diagnosis not present

## 2018-09-28 DIAGNOSIS — I10 Essential (primary) hypertension: Secondary | ICD-10-CM

## 2018-09-28 DIAGNOSIS — E11311 Type 2 diabetes mellitus with unspecified diabetic retinopathy with macular edema: Secondary | ICD-10-CM

## 2018-09-28 DIAGNOSIS — E113311 Type 2 diabetes mellitus with moderate nonproliferative diabetic retinopathy with macular edema, right eye: Secondary | ICD-10-CM | POA: Diagnosis not present

## 2018-09-28 DIAGNOSIS — H43813 Vitreous degeneration, bilateral: Secondary | ICD-10-CM | POA: Diagnosis not present

## 2018-10-25 ENCOUNTER — Other Ambulatory Visit: Payer: Self-pay

## 2018-10-25 ENCOUNTER — Encounter (INDEPENDENT_AMBULATORY_CARE_PROVIDER_SITE_OTHER): Payer: Medicare Other | Admitting: Ophthalmology

## 2018-10-25 DIAGNOSIS — H35033 Hypertensive retinopathy, bilateral: Secondary | ICD-10-CM

## 2018-10-25 DIAGNOSIS — H43813 Vitreous degeneration, bilateral: Secondary | ICD-10-CM | POA: Diagnosis not present

## 2018-10-25 DIAGNOSIS — E11311 Type 2 diabetes mellitus with unspecified diabetic retinopathy with macular edema: Secondary | ICD-10-CM

## 2018-10-25 DIAGNOSIS — H34812 Central retinal vein occlusion, left eye, with macular edema: Secondary | ICD-10-CM

## 2018-10-25 DIAGNOSIS — I10 Essential (primary) hypertension: Secondary | ICD-10-CM

## 2018-10-25 DIAGNOSIS — E113311 Type 2 diabetes mellitus with moderate nonproliferative diabetic retinopathy with macular edema, right eye: Secondary | ICD-10-CM | POA: Diagnosis not present

## 2018-10-27 DIAGNOSIS — R627 Adult failure to thrive: Secondary | ICD-10-CM | POA: Diagnosis not present

## 2018-10-27 DIAGNOSIS — E039 Hypothyroidism, unspecified: Secondary | ICD-10-CM | POA: Diagnosis not present

## 2018-10-27 DIAGNOSIS — M1990 Unspecified osteoarthritis, unspecified site: Secondary | ICD-10-CM | POA: Diagnosis not present

## 2018-10-27 DIAGNOSIS — N3941 Urge incontinence: Secondary | ICD-10-CM | POA: Diagnosis not present

## 2018-10-27 DIAGNOSIS — F3341 Major depressive disorder, recurrent, in partial remission: Secondary | ICD-10-CM | POA: Diagnosis not present

## 2018-10-27 DIAGNOSIS — I129 Hypertensive chronic kidney disease with stage 1 through stage 4 chronic kidney disease, or unspecified chronic kidney disease: Secondary | ICD-10-CM | POA: Diagnosis not present

## 2018-10-27 DIAGNOSIS — E1140 Type 2 diabetes mellitus with diabetic neuropathy, unspecified: Secondary | ICD-10-CM | POA: Diagnosis not present

## 2018-10-27 DIAGNOSIS — N183 Chronic kidney disease, stage 3 (moderate): Secondary | ICD-10-CM | POA: Diagnosis not present

## 2018-10-27 DIAGNOSIS — J449 Chronic obstructive pulmonary disease, unspecified: Secondary | ICD-10-CM | POA: Diagnosis not present

## 2018-11-17 DIAGNOSIS — H0014 Chalazion left upper eyelid: Secondary | ICD-10-CM | POA: Diagnosis not present

## 2018-11-22 ENCOUNTER — Encounter (INDEPENDENT_AMBULATORY_CARE_PROVIDER_SITE_OTHER): Payer: Medicare Other | Admitting: Ophthalmology

## 2018-12-12 DIAGNOSIS — H0014 Chalazion left upper eyelid: Secondary | ICD-10-CM | POA: Diagnosis not present

## 2019-01-11 DIAGNOSIS — E103312 Type 1 diabetes mellitus with moderate nonproliferative diabetic retinopathy with macular edema, left eye: Secondary | ICD-10-CM | POA: Diagnosis not present

## 2019-01-11 DIAGNOSIS — H34812 Central retinal vein occlusion, left eye, with macular edema: Secondary | ICD-10-CM | POA: Diagnosis not present

## 2019-01-11 DIAGNOSIS — H5202 Hypermetropia, left eye: Secondary | ICD-10-CM | POA: Diagnosis not present

## 2019-02-06 DIAGNOSIS — E113292 Type 2 diabetes mellitus with mild nonproliferative diabetic retinopathy without macular edema, left eye: Secondary | ICD-10-CM | POA: Diagnosis not present

## 2019-02-06 DIAGNOSIS — E113211 Type 2 diabetes mellitus with mild nonproliferative diabetic retinopathy with macular edema, right eye: Secondary | ICD-10-CM | POA: Diagnosis not present

## 2019-02-06 DIAGNOSIS — H34832 Tributary (branch) retinal vein occlusion, left eye, with macular edema: Secondary | ICD-10-CM | POA: Diagnosis not present

## 2019-02-06 DIAGNOSIS — H3563 Retinal hemorrhage, bilateral: Secondary | ICD-10-CM | POA: Diagnosis not present

## 2019-02-27 DIAGNOSIS — E039 Hypothyroidism, unspecified: Secondary | ICD-10-CM | POA: Diagnosis not present

## 2019-03-06 DIAGNOSIS — N1831 Chronic kidney disease, stage 3a: Secondary | ICD-10-CM | POA: Diagnosis not present

## 2019-03-06 DIAGNOSIS — E1140 Type 2 diabetes mellitus with diabetic neuropathy, unspecified: Secondary | ICD-10-CM | POA: Diagnosis not present

## 2019-03-06 DIAGNOSIS — R627 Adult failure to thrive: Secondary | ICD-10-CM | POA: Diagnosis not present

## 2019-03-06 DIAGNOSIS — H269 Unspecified cataract: Secondary | ICD-10-CM | POA: Diagnosis not present

## 2019-03-06 DIAGNOSIS — R1310 Dysphagia, unspecified: Secondary | ICD-10-CM | POA: Diagnosis not present

## 2019-03-06 DIAGNOSIS — D692 Other nonthrombocytopenic purpura: Secondary | ICD-10-CM | POA: Diagnosis not present

## 2019-03-06 DIAGNOSIS — R454 Irritability and anger: Secondary | ICD-10-CM | POA: Diagnosis not present

## 2019-03-06 DIAGNOSIS — I129 Hypertensive chronic kidney disease with stage 1 through stage 4 chronic kidney disease, or unspecified chronic kidney disease: Secondary | ICD-10-CM | POA: Diagnosis not present

## 2019-03-06 DIAGNOSIS — J449 Chronic obstructive pulmonary disease, unspecified: Secondary | ICD-10-CM | POA: Diagnosis not present

## 2019-03-06 DIAGNOSIS — Z0000 Encounter for general adult medical examination without abnormal findings: Secondary | ICD-10-CM | POA: Diagnosis not present

## 2019-03-06 DIAGNOSIS — Z1331 Encounter for screening for depression: Secondary | ICD-10-CM | POA: Diagnosis not present

## 2019-03-06 DIAGNOSIS — F3341 Major depressive disorder, recurrent, in partial remission: Secondary | ICD-10-CM | POA: Diagnosis not present

## 2019-03-13 DIAGNOSIS — H34832 Tributary (branch) retinal vein occlusion, left eye, with macular edema: Secondary | ICD-10-CM | POA: Diagnosis not present

## 2019-03-13 DIAGNOSIS — E113292 Type 2 diabetes mellitus with mild nonproliferative diabetic retinopathy without macular edema, left eye: Secondary | ICD-10-CM | POA: Diagnosis not present

## 2019-03-13 DIAGNOSIS — E113211 Type 2 diabetes mellitus with mild nonproliferative diabetic retinopathy with macular edema, right eye: Secondary | ICD-10-CM | POA: Diagnosis not present

## 2019-03-13 DIAGNOSIS — H3563 Retinal hemorrhage, bilateral: Secondary | ICD-10-CM | POA: Diagnosis not present

## 2019-03-22 ENCOUNTER — Ambulatory Visit: Payer: Medicare Other

## 2019-03-29 ENCOUNTER — Ambulatory Visit: Payer: Medicare Other | Attending: Internal Medicine

## 2019-03-29 DIAGNOSIS — Z23 Encounter for immunization: Secondary | ICD-10-CM | POA: Insufficient documentation

## 2019-03-29 NOTE — Progress Notes (Signed)
   Covid-19 Vaccination Clinic  Name:  SIGNE JAVORSKY    MRN: [id number] DOB: [birth year]  03/29/2019  Ms. Barreiro was observed post Covid-19 immunization for 15 minutes without incidence. She was provided with Vaccine Information Sheet and instruction to access the V-Safe system.   Ms. Glaspell was instructed to call 911 with any severe reactions post vaccine: Marland Kitchen Difficulty breathing  . Swelling of your face and throat  . A fast heartbeat  . A bad rash all over your body  . Dizziness and weakness    Immunizations Administered    Name Date Dose VIS Date Route   Pfizer COVID-19 Vaccine 03/29/2019  2:16 PM 0.3 mL 02/02/2019 Intramuscular   Manufacturer: [place]   Lot: [id number]   [place]: [id number]

## 2019-04-02 ENCOUNTER — Ambulatory Visit: Payer: Medicare Other

## 2019-04-10 DIAGNOSIS — E113292 Type 2 diabetes mellitus with mild nonproliferative diabetic retinopathy without macular edema, left eye: Secondary | ICD-10-CM | POA: Diagnosis not present

## 2019-04-10 DIAGNOSIS — H34832 Tributary (branch) retinal vein occlusion, left eye, with macular edema: Secondary | ICD-10-CM | POA: Diagnosis not present

## 2019-04-10 DIAGNOSIS — E113211 Type 2 diabetes mellitus with mild nonproliferative diabetic retinopathy with macular edema, right eye: Secondary | ICD-10-CM | POA: Diagnosis not present

## 2019-04-23 ENCOUNTER — Ambulatory Visit: Payer: Medicare Other | Attending: Internal Medicine

## 2019-04-23 DIAGNOSIS — Z23 Encounter for immunization: Secondary | ICD-10-CM

## 2019-04-23 NOTE — Progress Notes (Signed)
   Covid-19 Vaccination Clinic  Name:  [name]    MRN: [id number] DOB: October 02, 1934  04/23/2019  Ms. [name] was observed post Covid-19 immunization for 15 minutes without incidence. She was provided with Vaccine Information Sheet and instruction to access the V-Safe system.   Ms. [name] was instructed to call 911 with any severe reactions post vaccine: Marland Kitchen Difficulty breathing  . Swelling of your face and throat  . A fast heartbeat  . A bad rash all over your body  . Dizziness and weakness    Immunizations Administered    Name Date Dose VIS Date Route   Pfizer COVID-19 Vaccine 04/23/2019  4:54 PM 0.3 mL 02/02/2019 Intramuscular   Manufacturer: [place]   Lot: [id number]   [place]: [id number]

## 2019-05-15 DIAGNOSIS — E113211 Type 2 diabetes mellitus with mild nonproliferative diabetic retinopathy with macular edema, right eye: Secondary | ICD-10-CM | POA: Diagnosis not present

## 2019-05-15 DIAGNOSIS — H34832 Tributary (branch) retinal vein occlusion, left eye, with macular edema: Secondary | ICD-10-CM | POA: Diagnosis not present

## 2019-05-23 DIAGNOSIS — E113211 Type 2 diabetes mellitus with mild nonproliferative diabetic retinopathy with macular edema, right eye: Secondary | ICD-10-CM | POA: Diagnosis not present

## 2019-05-23 DIAGNOSIS — H211X1 Other vascular disorders of iris and ciliary body, right eye: Secondary | ICD-10-CM | POA: Diagnosis not present

## 2019-05-23 DIAGNOSIS — E113292 Type 2 diabetes mellitus with mild nonproliferative diabetic retinopathy without macular edema, left eye: Secondary | ICD-10-CM | POA: Diagnosis not present

## 2019-05-23 DIAGNOSIS — H35041 Retinal micro-aneurysms, unspecified, right eye: Secondary | ICD-10-CM | POA: Diagnosis not present

## 2019-05-23 DIAGNOSIS — E113511 Type 2 diabetes mellitus with proliferative diabetic retinopathy with macular edema, right eye: Secondary | ICD-10-CM | POA: Diagnosis not present

## 2019-05-23 DIAGNOSIS — H34832 Tributary (branch) retinal vein occlusion, left eye, with macular edema: Secondary | ICD-10-CM | POA: Diagnosis not present

## 2019-05-23 DIAGNOSIS — H3561 Retinal hemorrhage, right eye: Secondary | ICD-10-CM | POA: Diagnosis not present

## 2019-06-22 DIAGNOSIS — H34832 Tributary (branch) retinal vein occlusion, left eye, with macular edema: Secondary | ICD-10-CM | POA: Diagnosis not present

## 2019-06-22 DIAGNOSIS — E113292 Type 2 diabetes mellitus with mild nonproliferative diabetic retinopathy without macular edema, left eye: Secondary | ICD-10-CM | POA: Diagnosis not present

## 2019-06-22 DIAGNOSIS — E113211 Type 2 diabetes mellitus with mild nonproliferative diabetic retinopathy with macular edema, right eye: Secondary | ICD-10-CM | POA: Diagnosis not present

## 2019-07-05 DIAGNOSIS — H35371 Puckering of macula, right eye: Secondary | ICD-10-CM | POA: Diagnosis not present

## 2019-07-05 DIAGNOSIS — I728 Aneurysm of other specified arteries: Secondary | ICD-10-CM | POA: Diagnosis not present

## 2019-07-05 DIAGNOSIS — H3561 Retinal hemorrhage, right eye: Secondary | ICD-10-CM | POA: Diagnosis not present

## 2019-07-05 DIAGNOSIS — H3589 Other specified retinal disorders: Secondary | ICD-10-CM | POA: Diagnosis not present

## 2019-07-05 DIAGNOSIS — H3581 Retinal edema: Secondary | ICD-10-CM | POA: Diagnosis not present

## 2019-07-06 ENCOUNTER — Emergency Department (HOSPITAL_COMMUNITY): Payer: Medicare Other

## 2019-07-06 ENCOUNTER — Inpatient Hospital Stay (HOSPITAL_COMMUNITY)
Admission: EM | Admit: 2019-07-06 | Discharge: 2019-07-12 | DRG: 684 | Disposition: A | Discharge status: Skilled Nursing Facility | Payer: Medicare Other | Attending: Internal Medicine | Admitting: Internal Medicine

## 2019-07-06 ENCOUNTER — Other Ambulatory Visit: Payer: Self-pay

## 2019-07-06 DIAGNOSIS — Z7951 Long term (current) use of inhaled steroids: Secondary | ICD-10-CM

## 2019-07-06 DIAGNOSIS — F0390 Unspecified dementia without behavioral disturbance: Secondary | ICD-10-CM | POA: Diagnosis present

## 2019-07-06 DIAGNOSIS — E119 Type 2 diabetes mellitus without complications: Secondary | ICD-10-CM

## 2019-07-06 DIAGNOSIS — R609 Edema, unspecified: Secondary | ICD-10-CM | POA: Diagnosis not present

## 2019-07-06 DIAGNOSIS — Z79899 Other long term (current) drug therapy: Secondary | ICD-10-CM

## 2019-07-06 DIAGNOSIS — I959 Hypotension, unspecified: Secondary | ICD-10-CM | POA: Diagnosis not present

## 2019-07-06 DIAGNOSIS — Z20822 Contact with and (suspected) exposure to covid-19: Secondary | ICD-10-CM | POA: Diagnosis present

## 2019-07-06 DIAGNOSIS — K219 Gastro-esophageal reflux disease without esophagitis: Secondary | ICD-10-CM | POA: Diagnosis present

## 2019-07-06 DIAGNOSIS — E039 Hypothyroidism, unspecified: Secondary | ICD-10-CM | POA: Diagnosis present

## 2019-07-06 DIAGNOSIS — R0902 Hypoxemia: Secondary | ICD-10-CM | POA: Diagnosis not present

## 2019-07-06 DIAGNOSIS — E860 Dehydration: Secondary | ICD-10-CM | POA: Diagnosis not present

## 2019-07-06 DIAGNOSIS — E785 Hyperlipidemia, unspecified: Secondary | ICD-10-CM | POA: Diagnosis present

## 2019-07-06 DIAGNOSIS — E1122 Type 2 diabetes mellitus with diabetic chronic kidney disease: Secondary | ICD-10-CM | POA: Diagnosis not present

## 2019-07-06 DIAGNOSIS — Z8673 Personal history of transient ischemic attack (TIA), and cerebral infarction without residual deficits: Secondary | ICD-10-CM

## 2019-07-06 DIAGNOSIS — R52 Pain, unspecified: Secondary | ICD-10-CM | POA: Diagnosis not present

## 2019-07-06 DIAGNOSIS — S79912A Unspecified injury of left hip, initial encounter: Secondary | ICD-10-CM | POA: Diagnosis not present

## 2019-07-06 DIAGNOSIS — I639 Cerebral infarction, unspecified: Secondary | ICD-10-CM | POA: Diagnosis not present

## 2019-07-06 DIAGNOSIS — Z79890 Hormone replacement therapy (postmenopausal): Secondary | ICD-10-CM

## 2019-07-06 DIAGNOSIS — W050XXA Fall from non-moving wheelchair, initial encounter: Secondary | ICD-10-CM | POA: Diagnosis present

## 2019-07-06 DIAGNOSIS — W19XXXA Unspecified fall, initial encounter: Secondary | ICD-10-CM | POA: Diagnosis not present

## 2019-07-06 DIAGNOSIS — M25552 Pain in left hip: Secondary | ICD-10-CM | POA: Diagnosis not present

## 2019-07-06 DIAGNOSIS — Z9109 Other allergy status, other than to drugs and biological substances: Secondary | ICD-10-CM

## 2019-07-06 DIAGNOSIS — M25562 Pain in left knee: Secondary | ICD-10-CM | POA: Diagnosis not present

## 2019-07-06 DIAGNOSIS — M1990 Unspecified osteoarthritis, unspecified site: Secondary | ICD-10-CM | POA: Diagnosis present

## 2019-07-06 DIAGNOSIS — S8992XA Unspecified injury of left lower leg, initial encounter: Secondary | ICD-10-CM | POA: Diagnosis not present

## 2019-07-06 DIAGNOSIS — E1165 Type 2 diabetes mellitus with hyperglycemia: Secondary | ICD-10-CM | POA: Diagnosis present

## 2019-07-06 DIAGNOSIS — G4733 Obstructive sleep apnea (adult) (pediatric): Secondary | ICD-10-CM | POA: Diagnosis present

## 2019-07-06 DIAGNOSIS — N1831 Chronic kidney disease, stage 3a: Secondary | ICD-10-CM | POA: Diagnosis present

## 2019-07-06 DIAGNOSIS — R29898 Other symptoms and signs involving the musculoskeletal system: Secondary | ICD-10-CM

## 2019-07-06 DIAGNOSIS — R531 Weakness: Secondary | ICD-10-CM | POA: Diagnosis not present

## 2019-07-06 DIAGNOSIS — Z9889 Other specified postprocedural states: Secondary | ICD-10-CM | POA: Diagnosis not present

## 2019-07-06 DIAGNOSIS — Z87442 Personal history of urinary calculi: Secondary | ICD-10-CM

## 2019-07-06 DIAGNOSIS — I129 Hypertensive chronic kidney disease with stage 1 through stage 4 chronic kidney disease, or unspecified chronic kidney disease: Secondary | ICD-10-CM | POA: Diagnosis present

## 2019-07-06 DIAGNOSIS — Z8249 Family history of ischemic heart disease and other diseases of the circulatory system: Secondary | ICD-10-CM

## 2019-07-06 DIAGNOSIS — Z87891 Personal history of nicotine dependence: Secondary | ICD-10-CM

## 2019-07-06 DIAGNOSIS — I454 Nonspecific intraventricular block: Secondary | ICD-10-CM | POA: Diagnosis present

## 2019-07-06 DIAGNOSIS — Z7902 Long term (current) use of antithrombotics/antiplatelets: Secondary | ICD-10-CM

## 2019-07-06 DIAGNOSIS — J449 Chronic obstructive pulmonary disease, unspecified: Secondary | ICD-10-CM | POA: Diagnosis present

## 2019-07-06 DIAGNOSIS — Z8711 Personal history of peptic ulcer disease: Secondary | ICD-10-CM

## 2019-07-06 DIAGNOSIS — N179 Acute kidney failure, unspecified: Secondary | ICD-10-CM | POA: Diagnosis not present

## 2019-07-06 DIAGNOSIS — I10 Essential (primary) hypertension: Secondary | ICD-10-CM | POA: Diagnosis present

## 2019-07-06 DIAGNOSIS — M353 Polymyalgia rheumatica: Secondary | ICD-10-CM | POA: Diagnosis present

## 2019-07-06 DIAGNOSIS — S3993XA Unspecified injury of pelvis, initial encounter: Secondary | ICD-10-CM | POA: Diagnosis not present

## 2019-07-06 DIAGNOSIS — Z7984 Long term (current) use of oral hypoglycemic drugs: Secondary | ICD-10-CM

## 2019-07-06 DIAGNOSIS — N3941 Urge incontinence: Secondary | ICD-10-CM | POA: Diagnosis present

## 2019-07-06 LAB — SARS CORONAVIRUS 2 BY RT PCR (HOSPITAL ORDER, PERFORMED IN ~~LOC~~ HOSPITAL LAB): SARS Coronavirus 2: NEGATIVE

## 2019-07-06 LAB — CBC WITH DIFFERENTIAL/PLATELET
Abs Immature Granulocytes: 0.05 10*3/uL (ref 0.00–0.07)
Basophils Absolute: 0 10*3/uL (ref 0.0–0.1)
Basophils Relative: 0 %
Eosinophils Absolute: 0.1 10*3/uL (ref 0.0–0.5)
Eosinophils Relative: 1 %
HCT: 41.4 % (ref 36.0–46.0)
Hemoglobin: 13 g/dL (ref 12.0–15.0)
Immature Granulocytes: 1 %
Lymphocytes Relative: 20 %
Lymphs Abs: 2.2 10*3/uL (ref 0.7–4.0)
MCH: 31.4 pg (ref 26.0–34.0)
MCHC: 31.4 g/dL (ref 30.0–36.0)
MCV: 100 fL (ref 80.0–100.0)
Monocytes Absolute: 1 10*3/uL (ref 0.1–1.0)
Monocytes Relative: 9 %
Neutro Abs: 7.4 10*3/uL (ref 1.7–7.7)
Neutrophils Relative %: 69 %
Platelets: 246 10*3/uL (ref 150–400)
RBC: 4.14 MIL/uL (ref 3.87–5.11)
RDW: 12.9 % (ref 11.5–15.5)
WBC: 10.8 10*3/uL — ABNORMAL HIGH (ref 4.0–10.5)
nRBC: 0 % (ref 0.0–0.2)

## 2019-07-06 LAB — BASIC METABOLIC PANEL
Anion gap: 11 (ref 5–15)
BUN: 28 mg/dL — ABNORMAL HIGH (ref 8–23)
CO2: 24 mmol/L (ref 22–32)
Calcium: 9.4 mg/dL (ref 8.9–10.3)
Chloride: 102 mmol/L (ref 98–111)
Creatinine, Ser: 1.46 mg/dL — ABNORMAL HIGH (ref 0.44–1.00)
GFR calc Af Amer: 38 mL/min — ABNORMAL LOW (ref >60)
GFR calc non Af Amer: 33 mL/min — ABNORMAL LOW (ref >60)
Glucose, Bld: 134 mg/dL — ABNORMAL HIGH (ref 70–99)
Potassium: 4.5 mmol/L (ref 3.5–5.1)
Sodium: 137 mmol/L (ref 135–145)

## 2019-07-06 LAB — GLUCOSE, CAPILLARY
Glucose-Capillary: 110 mg/dL — ABNORMAL HIGH (ref 70–99)
Glucose-Capillary: 102 mg/dL — ABNORMAL HIGH (ref 70–99)

## 2019-07-06 LAB — HEMOGLOBIN A1C
Hgb A1c MFr Bld: 5.7 % — ABNORMAL HIGH (ref 4.8–5.6)
Mean Plasma Glucose: 116.89 mg/dL

## 2019-07-06 LAB — CBG MONITORING, ED
Glucose-Capillary: 97 mg/dL (ref 70–99)
Glucose-Capillary: 92 mg/dL (ref 70–99)

## 2019-07-06 MED ORDER — IRBESARTAN 75 MG PO TABS: 75.0000 mg | ORAL_TABLET | Freq: Every day | ORAL | Status: DC

## 2019-07-06 MED ORDER — DULOXETINE HCL 30 MG PO CPEP
60.0000 mg | ORAL_CAPSULE | Freq: Every morning | ORAL | Status: DC
  Administered 2019-07-07 – 2019-07-12 (×6): 60 mg via ORAL
  Filled 2019-07-06 (×6): qty 2

## 2019-07-06 MED ORDER — SODIUM CHLORIDE 0.9 % IV SOLN: INTRAVENOUS | Status: DC

## 2019-07-06 MED ORDER — LURASIDONE HCL 40 MG PO TABS
40.0000 mg | ORAL_TABLET | Freq: Every evening | ORAL | Status: DC
  Administered 2019-07-06 – 2019-07-11 (×6): 40 mg via ORAL
  Filled 2019-07-06 (×7): qty 1

## 2019-07-06 MED ORDER — FOLIC ACID 1 MG PO TABS
1.0000 mg | ORAL_TABLET | Freq: Two times a day (BID) | ORAL | Status: DC
  Administered 2019-07-06 – 2019-07-12 (×12): 1 mg via ORAL
  Filled 2019-07-06 (×12): qty 1

## 2019-07-06 MED ORDER — HYDRALAZINE HCL 20 MG/ML IJ SOLN
10.0000 mg | Freq: Four times a day (QID) | INTRAMUSCULAR | Status: DC | PRN
  Administered 2019-07-07 – 2019-07-08 (×2): 10 mg via INTRAVENOUS
  Filled 2019-07-06 (×2): qty 1

## 2019-07-06 MED ORDER — DONEPEZIL HCL 5 MG PO TABS
5.0000 mg | ORAL_TABLET | Freq: Every day | ORAL | Status: DC
  Administered 2019-07-06 – 2019-07-11 (×6): 5 mg via ORAL
  Filled 2019-07-06 (×6): qty 1

## 2019-07-06 MED ORDER — GATIFLOXACIN 0.5 % OP SOLN
1.0000 [drp] | Freq: Four times a day (QID) | OPHTHALMIC | Status: DC
  Administered 2019-07-06 – 2019-07-12 (×23): 1 [drp] via OPHTHALMIC
  Filled 2019-07-06 (×5): qty 2.5

## 2019-07-06 MED ORDER — VITAMIN D 25 MCG (1000 UNIT) PO TABS
1000.0000 [IU] | ORAL_TABLET | Freq: Every day | ORAL | Status: DC
  Administered 2019-07-06 – 2019-07-12 (×7): 1000 [IU] via ORAL
  Filled 2019-07-06 (×7): qty 1

## 2019-07-06 MED ORDER — ATORVASTATIN CALCIUM 40 MG PO TABS: 40.0000 mg | ORAL_TABLET | Freq: Every day | ORAL | Status: DC

## 2019-07-06 MED ORDER — INSULIN ASPART 100 UNIT/ML ~~LOC~~ SOLN
0.0000 [IU] | Freq: Every day | SUBCUTANEOUS | Status: DC
  Administered 2019-07-09: 2 [IU] via SUBCUTANEOUS
  Filled 2019-07-06: qty 0.05

## 2019-07-06 MED ORDER — ACETAMINOPHEN 325 MG PO TABS
650.0000 mg | ORAL_TABLET | Freq: Four times a day (QID) | ORAL | Status: DC | PRN
  Administered 2019-07-06 – 2019-07-08 (×2): 650 mg via ORAL
  Filled 2019-07-06 (×2): qty 2

## 2019-07-06 MED ORDER — VITAMIN B-12 1000 MCG PO TABS
1000.0000 ug | ORAL_TABLET | Freq: Every day | ORAL | Status: DC
  Administered 2019-07-06 – 2019-07-12 (×7): 1000 ug via ORAL
  Filled 2019-07-06 (×7): qty 1

## 2019-07-06 MED ORDER — PREDNISOLONE ACETATE 1 % OP SUSP
1.0000 [drp] | Freq: Four times a day (QID) | OPHTHALMIC | Status: DC
  Administered 2019-07-06 – 2019-07-12 (×23): 1 [drp] via OPHTHALMIC
  Filled 2019-07-06 (×3): qty 5

## 2019-07-06 MED ORDER — ALBUTEROL SULFATE (2.5 MG/3ML) 0.083% IN NEBU: 3.0000 mL | INHALATION_SOLUTION | RESPIRATORY_TRACT | Status: DC | PRN

## 2019-07-06 MED ORDER — ACETAMINOPHEN 650 MG RE SUPP: 650.0000 mg | Freq: Four times a day (QID) | RECTAL | Status: DC | PRN

## 2019-07-06 MED ORDER — CLOPIDOGREL BISULFATE 75 MG PO TABS
75.0000 mg | ORAL_TABLET | Freq: Every day | ORAL | Status: DC
  Administered 2019-07-07 – 2019-07-12 (×6): 75 mg via ORAL
  Filled 2019-07-06 (×6): qty 1

## 2019-07-06 MED ORDER — INSULIN ASPART 100 UNIT/ML ~~LOC~~ SOLN
0.0000 [IU] | Freq: Three times a day (TID) | SUBCUTANEOUS | Status: DC
  Administered 2019-07-07: 1 [IU] via SUBCUTANEOUS
  Administered 2019-07-08: 2 [IU] via SUBCUTANEOUS
  Administered 2019-07-09 – 2019-07-11 (×6): 1 [IU] via SUBCUTANEOUS
  Administered 2019-07-11 – 2019-07-12 (×2): 2 [IU] via SUBCUTANEOUS
  Administered 2019-07-12: 1 [IU] via SUBCUTANEOUS
  Filled 2019-07-06: qty 0.09

## 2019-07-06 MED ORDER — HEPARIN SODIUM (PORCINE) 5000 UNIT/ML IJ SOLN
5000.0000 [IU] | Freq: Three times a day (TID) | INTRAMUSCULAR | Status: DC
  Administered 2019-07-06 – 2019-07-12 (×19): 5000 [IU] via SUBCUTANEOUS
  Filled 2019-07-06 (×19): qty 1

## 2019-07-06 MED ORDER — LEVOTHYROXINE SODIUM 100 MCG PO TABS
100.0000 ug | ORAL_TABLET | Freq: Every day | ORAL | Status: DC
  Administered 2019-07-07 – 2019-07-12 (×6): 100 ug via ORAL
  Filled 2019-07-06 (×6): qty 1

## 2019-07-06 MED ORDER — LAMOTRIGINE 25 MG PO TABS
150.0000 mg | ORAL_TABLET | Freq: Every morning | ORAL | Status: DC
  Administered 2019-07-07 – 2019-07-12 (×6): 150 mg via ORAL
  Filled 2019-07-06 (×6): qty 1

## 2019-07-06 MED ORDER — FLUTICASONE FUROATE-VILANTEROL 200-25 MCG/INH IN AEPB
1.0000 | INHALATION_SPRAY | Freq: Every day | RESPIRATORY_TRACT | Status: DC
  Administered 2019-07-07 – 2019-07-11 (×5): 1 via RESPIRATORY_TRACT
  Filled 2019-07-06: qty 28

## 2019-07-06 NOTE — ED Triage Notes (Signed)
Per EMS, family states that pt had eye surgery today and has been sleepy after all day. Pt fell on her knees today while tying to walk. Per medic, Family denies pt hitting head or LOC.

## 2019-07-06 NOTE — Progress Notes (Signed)
Subjective: Patient admitted this morning, see detailed H&P by Dr Josephine Cables  86 year old female with medical history of dementia, diabetes mellitus type 2, hypertension, hypothyroidism, hyperlipidemia, COPD presented to ED with complaints of generalized weakness and fall.  Patient had right eye surgery after that when she went home.  She was not able to get up from wheelchair and landed on her knees with subsequent left knee pain and difficulty bearing weight on left leg. Imaging studies in the ED was unremarkable.  Patient still has significant swelling and pain in the left knee.  X-ray of the knee was unremarkable.  Vitals:   07/06/19 0650 07/06/19 0854  BP:  100/73  Pulse: 70 76  Resp:  17  Temp:    SpO2: 95% 100%      A/P Left knee pain and swelling Generalized weakness Hypertension Hyperlipidemia Diabetes mellitus type 2  PT OT evaluation Cold compress to left knee. Avoid NSAIDs as she has CKD stage III, also she is taking Plavix. Will follow.    [place] [facility] [phone]

## 2019-07-06 NOTE — ED Notes (Signed)
Pt sitting up in bed. Family at bedside. Pt denies any needs. Will continue to monitor.

## 2019-07-06 NOTE — H&P (Signed)
History and Physical  [name] [id number] DOB: 11-Jun-1934 DOA: 07/06/2019  Referring physician: Rada Hay MD PCP: Shon Baton, MD  Patient coming from: Home  Chief Complaint: Knee pain and generalized weakness  HPI: [name] is a 85 y.o. female with medical history significant for dementia, diabetes mellitus, hypertension, hypothyroidism, hyperlipidemia and COPD presents to the emergency department accompanied by son due to generalized weakness which started after Right eye surgery.  Patient had an outpatient  right eye surgery yesterday, after recovery from the surgery, she was discharged home, but she was noted to be very weak and somnolent.  She had a fall while being transferred from a wheelchair to her chair and landed on her knees with subsequent left knee pain and with difficulty to bear weight on the left leg.  It was decided for her to go to the ED for further evaluation.  Patient ambulates with a walker at baseline  ED Course:  In the emergency department, she was hemodynamically stable.  Work-up in the ED showed BUN 20/1.46 (baseline creatinine 1.20-1.35) and hyperglycemia.  Imaging studies done including CT pelvis without contrast, unilateral hip and left knee x-rays showed no fracture.  Hospitalist was asked to admit.  For further evaluation and management.  Review of Systems: Constitutional: Generalized weakness.  Negative for chills and fever.  HEENT: Negative for ear pain and sore throat.   Eyes: Negative for pain and visual disturbance.  Respiratory: Negative for cough, chest tightness and shortness of breath.   Cardiovascular: Negative for chest pain and palpitations.  Gastrointestinal: Negative for abdominal pain and vomiting.  Endocrine: Negative for polyphagia and polyuria.  Genitourinary: Negative for decreased urine volume, dysuria, enuresis, hematuria, vaginal discharge and vaginal pain.  Musculoskeletal: Positive for left knee pain.  Negative for  back pain.  Skin: Negative for color change and rash.  Allergic/Immunologic: Negative for immunocompromised state.  Neurological: Negative for tremors, syncope, speech difficulty, weakness, light-headedness and headaches.  Hematological: Does not bruise/bleed easily.  All other systems reviewed and are negative    Past Medical History:  Diagnosis Date  . Abnormality of gait   . Arthritis   . Bifascicular bundle branch block    chronic  . Degenerative spinal arthritis    cervical and lumbar  . Dementia    early onset  . Dyspnea on exertion   . GERD (gastroesophageal reflux disease)   . History of CVA (cerebrovascular accident)    03-31-2012--  right cerebellar infart without hemorrhgia  . History of gastric ulcer    2011  w/ bleed  . History of kidney stones   . History of squamous cell carcinoma excision   . Hyperlipidemia   . Hypertension   . Hypothyroidism   . Obstructive sleep apnea    has not been using -- but states has appointment soon to get refitted   . Polymyalgia rheumatica ([place])   . Stroke ([place])   . Type 2 diabetes mellitus ([place])   . Urge urinary incontinence    refactory   Past Surgical History:  Procedure Laterality Date  . APPENDECTOMY  as child  . HERNIA REPAIR  1956  . INTERSTIM IMPLANT PLACEMENT N/A 11/19/2014   Procedure: Barrie Lyme IMPLANT FIRST STAGE AND ;  Surgeon: Bjorn Loser, MD;  Location: [facility];  Service: Urology;  Laterality: N/A;  . INTERSTIM IMPLANT PLACEMENT N/A 11/19/2014   Procedure: Barrie Lyme IMPLANT SECOND STAGE;  Surgeon: Bjorn Loser, MD;  Location: [place];  Service:  Urology;  Laterality: N/A;  . ORIF RIGHT WRIST FX  03-04-2004  . SHOULDER ARTHROSCOPY / DEBRIDEMENT/  SAD/  DCR/  MINI OPEN ROTATOR CUFF REPAIR Right 01-09-2003  . TEE WITHOUT CARDIOVERSION N/A 09/12/2012   Procedure: TRANSESOPHAGEAL ECHOCARDIOGRAM (TEE);  Surgeon: Lelon Perla, MD;  Location: [facility] ENDOSCOPY;  Service:  Cardiovascular;  Laterality: N/A;  proximal septal thickening and chordal SAM creatin a narrow LVOT; turbulence noted  . TONSILLECTOMY  as child  . TOTAL KNEE ARTHROPLASTY Left 11-12-2004  . TRANSTHORACIC ECHOCARDIOGRAM  09-10-2012   mild LVH, mild  focal basal hypertrophy of the septum,  ef [id number], grade I diastolic dysfunction/  trival AR /  MV with moderate systolic anterior motion of the chordal structures/  mild LAE    Social History:  reports that she quit smoking about 14 years ago. She has never used smokeless tobacco. She reports that she does not drink alcohol or use drugs.   Allergies  Allergen Reactions  . Adhesive [Tape] Other (See Comments)    Irritation and pulls off skin    Family History  Problem Relation Age of Onset  . Heart failure Mother       Prior to Admission medications   Medication Sig Start Date End Date Taking? Authorizing Provider  acetaminophen (TYLENOL) 500 MG tablet Take 1,000 mg by mouth See admin instructions. 1000mg  in the am and 1500 mg in the pm   Yes [provider]  albuterol (VENTOLIN HFA) 108 (90 Base) MCG/ACT inhaler Inhale 2 puffs into the lungs every 4 (four) hours as needed for wheezing or shortness of breath.    Yes [provider]  cholecalciferol (VITAMIN D3) 25 MCG (1000 UNIT) tablet Take 1,000 Units by mouth daily.   Yes [provider]  clopidogrel (PLAVIX) 75 MG tablet Take 1 tablet (75 mg total) by mouth daily with breakfast. 09/12/12  Yes Dhungel, Nishant, MD  donepezil (ARICEPT) 10 MG tablet Take 10 mg by mouth at bedtime.   Yes [provider]  DULoxetine (CYMBALTA) 60 MG capsule Take 60 mg by mouth every morning.   Yes [provider]  fluticasone furoate-vilanterol (BREO ELLIPTA) 200-25 MCG/INH AEPB Inhale 1 puff into the lungs daily.   Yes [provider]  folic acid (FOLVITE) 1 MG tablet Take 1 tablet (1 mg total) by mouth 2 (two) times daily. 04/10/12  Yes Angiulli, Lavon Paganini, PA-C  glimepiride (AMARYL) 2 MG tablet Take 2 mg by mouth daily with breakfast.   Yes [provider]  lamoTRIgine (LAMICTAL) 150 MG tablet Take 150 mg by mouth every morning.   Yes [provider]  levothyroxine (SYNTHROID, LEVOTHROID) 100 MCG tablet Take 1 tablet (100 mcg total) by mouth daily. 04/10/12  Yes Angiulli, Lavon Paganini, PA-C  losartan (COZAAR) 50 MG tablet Take 50 mg by mouth daily.   Yes [provider]  vitamin B-12 (CYANOCOBALAMIN) 1000 MCG tablet Take 1 tablet (1,000 mcg total) by mouth daily. Patient taking differently: Take 5,000 mcg by mouth every morning.  04/10/12  Yes Angiulli, Lavon Paganini, PA-C  atorvastatin (LIPITOR) 40 MG tablet Take 1 tablet (40 mg total) by mouth daily at 6 PM. Keep upcoming visit for further refills, thanks. 09/02/17   Martinique, Peter M, MD  Cholecalciferol 5000 UNITS capsule Take 1 capsule (5,000 Units total) by mouth daily. Patient not taking: Reported on 07/06/2019 04/10/12   Angiulli, Lavon Paganini, PA-C  donepezil (ARICEPT) 5 MG tablet Take 1 tablet (5 mg total) by mouth  at bedtime. Patient not taking: Reported on 07/06/2019 04/10/12   Angiulli, Lavon Paganini, PA-C  irbesartan (AVAPRO) 75 MG tablet Take 1 tablet (75 mg total) by mouth daily. 09/06/17   Almyra Deforest, PA  Lurasidone HCl (LATUDA) 20 MG TABS Take 1 tablet by mouth every evening.    [provider]    Physical Exam: BP (!) 120/57   Pulse 77   Temp 98.1 F (36.7 C)   Resp 16   SpO2 95%   . General: 85 y.o. year-old female well developed well nourished in no acute distress.  Alert and oriented x3. Marland Kitchen HEENT: Normocephalic, right eye with occlusive bandage.  Dry mucous membrane. . Cardiovascular: Regular rate and rhythm with no rubs or gallops.  No thyromegaly or JVD noted.  No lower extremity edema. 2/4 pulses in all 4 extremities. Marland Kitchen Respiratory: Clear to auscultation with no wheezes or rales. Good inspiratory effort. . Abdomen: Soft nontender nondistended with normal  bowel sounds x4 quadrants. . Extremities: Pain in left knee and on passive movement of left hip . Muskuloskeletal: No cyanosis, clubbing or edema noted bilaterally . Neuro: CN II-XII intact, strength, sensation, reflexes . Skin: No ulcerative lesions noted or rashes . Psychiatry: Judgement and insight appear normal. Mood is appropriate for condition and setting          Labs on Admission:  Basic Metabolic Panel: Recent Labs  Lab 07/06/19 0440  NA 137  K 4.5  CL 102  CO2 24  GLUCOSE 134*  BUN 28*  CREATININE 1.46*  CALCIUM 9.4   Liver Function Tests: No results for input(s): AST, ALT, ALKPHOS, BILITOT, PROT, ALBUMIN in the last 168 hours. No results for input(s): LIPASE, AMYLASE in the last 168 hours. No results for input(s): AMMONIA in the last 168 hours. CBC: Recent Labs  Lab 07/06/19 0440  WBC 10.8*  NEUTROABS 7.4  HGB 13.0  HCT 41.4  MCV 100.0  PLT 246   Cardiac Enzymes: No results for input(s): CKTOTAL, CKMB, CKMBINDEX, TROPONINI in the last 168 hours.  BNP (last 3 results) No results for input(s): BNP in the last 8760 hours.  ProBNP (last 3 results) No results for input(s): PROBNP in the last 8760 hours.  CBG: No results for input(s): GLUCAP in the last 168 hours.  Radiological Exams on Admission: CT PELVIS WO CONTRAST  Result Date: 07/06/2019 CLINICAL DATA:  Left hip pain after fall. EXAM: CT PELVIS WITHOUT CONTRAST TECHNIQUE: Multidetector CT imaging of the pelvis was performed following the standard protocol without intravenous contrast. COMPARISON:  None. FINDINGS: Urinary Tract:  Collapsed urinary bladder. Bowel:  No evidence of inflammation or obstruction. Vascular/Lymphatic: Diffuse atherosclerotic plaque. Reproductive:  Small calcified fibroid. Other:  Negative for ascites. Musculoskeletal: No visible fracture.  No dislocation. Lower lumbar disc narrowing and facet arthropathy. Sacral stimulator on the right. IMPRESSION: No malalignment or visible  fracture. Electronically Signed   By: Monte Fantasia M.D.   On: 07/06/2019 04:21   DG Knee Complete 4 Views Left  Result Date: 07/06/2019 CLINICAL DATA:  Fall pain to the knee EXAM: LEFT KNEE - COMPLETE 4+ VIEW COMPARISON:  None. FINDINGS: The patient is status post left total knee arthroplasty. No periprosthetic lucency or fracture is identified. There is a small knee joint effusion. There is diffuse osteopenia. Scattered vascular calcifications are noted. IMPRESSION: Status post left total knee arthroplasty without complication. Electronically Signed   By: Prudencio Pair M.D.   On: 07/06/2019 01:49   DG Hip Unilat W or Wo  Pelvis 2-3 Views Left  Result Date: 07/06/2019 CLINICAL DATA:  Fall and pain EXAM: DG HIP (WITH OR WITHOUT PELVIS) 2-3V LEFT COMPARISON:  None. FINDINGS: Limited examination due to technique. No definite fracture seen. There is moderate bilateral hip osteoarthritis with superior joint space loss. Diffuse osteopenia is noted. IMPRESSION: No definite fracture. If there is high clinical suspicion for occult hip fracture or the patient refuses to weightbear, consider further evaluation with cross-sectional imaging. Electronically Signed   By: Prudencio Pair M.D.   On: 07/06/2019 01:50    EKG: I independently viewed the EKG done and my findings are as followed: EKG was not done in the ED  Assessment/Plan Present on Admission: . Hypertension . Hyperlipidemia . Cerebral infarction [facility])  Principal Problem:   Generalized weakness Active Problems:   Hypertension   Hyperlipidemia   Diabetes mellitus ([place])   Cerebral infarction ([place])   AKI (acute kidney injury) ([place])   Dehydration   Accidental fall   Dementia without behavioral disturbance (H[facility])  Accidental fall possibly due to to generalized weakness Patient will be admitted to MedSurg unit Continue fall precaution and neuro checks Continue PT/OT eval and treat  Dehydration Continue IV hydration  Acute kidney injury BUN  20/1.46 (baseline creatinine 1.20-1.35)  Renally adjust medications, avoid nephrotoxic agents/dehydration/hypotension  Hypertension Continue Avapro  Hyperlipidemia Continue statin  Type 2 diabetes mellitus Continue/sliding scale and hypoglycemia protocol  History of cerebral infarction Continue Plavix and statin  Dementia Continue Donepezil  DVT prophylaxis: Subcu heparin  Code Status: Full code  Family Communication: Son at bedside (all questions answered to satisfaction)  Disposition Plan: Home with home health vs SNF pending PT/OT eval recommendation  Consults called: None  Admission status: Observation    Bernadette Hoit MD [facility]  If 7PM-7AM, please contact night-coverage [email or web address]  07/06/2019, 6:37 AM

## 2019-07-06 NOTE — ED Notes (Signed)
Made Dr. Darrick Meigs aware of patient's soft pressures.  No new orders received.

## 2019-07-06 NOTE — Evaluation (Signed)
Physical Therapy Evaluation Patient Details Name: [name] MRN: [id number] DOB: 19-May-1934 Today's Date: 07/06/2019   History of Present Illness  Pt admitted s/p fall at home and with hx of DM, CVA, Dementia, polymyalgia Rheumatica, L TKR and Eye surgery 07/05/19.  Imaging reports no fxs  Clinical Impression  Pt admitted as above and presenting with functional mobility limitations 2* generalized weakness, min WB tolerance on L LE, balance deficits, and obesity.  Pt currently requiring significant assist of 2 for performance of basic mobility tasks and would benefit from follow up rehab at SNF level to maximize IND and safety.    Follow Up Recommendations SNF    Equipment Recommendations  None recommended by PT    Recommendations for Other Services OT consult     Precautions / Restrictions Precautions Precautions: Fall Precaution Comments: Pt must sit up 2* eye surgery Restrictions Weight Bearing Restrictions: No      Mobility  Bed Mobility Overal bed mobility: Needs Assistance Bed Mobility: Supine to Sit;Sit to Supine     Supine to sit: Mod assist;+2 for physical assistance;+2 for safety/equipment Sit to supine: Mod assist;Max assist;+2 for physical assistance;+2 for safety/equipment   General bed mobility comments: Increased time with cues for sequence  Transfers Overall transfer level: Needs assistance Equipment used: Charlaine Dalton) Transfers: Sit to/from Stand Sit to Stand: Mod assist;Max assist;+2 physical assistance;From elevated surface         General transfer comment: Pt min WB on L LE; Cues for use of UEs to self assist; Physial assist to bring wt up and fwd and to balance in standing  Ambulation/Gait             General Gait Details: pt stood only - nonambulatory at this time  Financial trader Rankin (Stroke Patients Only)       Balance Overall balance assessment: Needs assistance Sitting-balance  support: Bilateral upper extremity supported;Feet supported Sitting balance-Leahy Scale: Poor Sitting balance - Comments: posterior and Right drift   Standing balance support: Bilateral upper extremity supported Standing balance-Leahy Scale: Poor Standing balance comment: Posterior and R lean                             Pertinent Vitals/Pain Pain Assessment: 0-10 Pain Score: 8  Pain Location: L knee Pain Descriptors / Indicators: Aching;Sore;Guarding Pain Intervention(s): Limited activity within patient's tolerance;Monitored during session    Home Living Family/patient expects to be discharged to:: Private residence Living Arrangements: Children Available Help at Discharge: Family;Available PRN/intermittently Type of Home: House Home Access: Stairs to enter Entrance Stairs-Rails: Right;Left;Can reach both Entrance Stairs-Number of Steps: 5 Home Layout: One level Home Equipment: Walker - 4 wheels;Walker - 2 wheels;Cane - single point Additional Comments: Son works days and stays with pt nights    Prior Function Level of Independence: Independent with assistive device(s)         Comments: Used rollator      Hand Dominance   Dominant Hand: Right    Extremity/Trunk Assessment   Upper Extremity Assessment Upper Extremity Assessment: Generalized weakness    Lower Extremity Assessment Lower Extremity Assessment: Generalized weakness;LLE deficits/detail LLE: Unable to fully assess due to pain    Cervical / Trunk Assessment Cervical / Trunk Assessment: Kyphotic  Communication   Communication: No difficulties  Cognition Arousal/Alertness: Awake/alert Behavior During Therapy: WFL for tasks assessed/performed;Flat affect Overall Cognitive  Status: History of cognitive impairments - at baseline                                 General Comments: Pt very cooperative and following all commands      General Comments      Exercises      Assessment/Plan    PT Assessment Patient needs continued PT services  PT Problem List Decreased strength;Decreased range of motion;Decreased activity tolerance;Decreased balance;Decreased mobility;Decreased knowledge of use of DME;Obesity;Pain       PT Treatment Interventions DME instruction;Gait training;Stair training;Functional mobility training;Therapeutic activities;Therapeutic exercise;Balance training;Patient/family education    PT Goals (Current goals can be found in the Care Plan section)  Acute Rehab PT Goals Patient Stated Goal: Get better PT Goal Formulation: With patient/family Time For Goal Achievement: 07/20/19 Potential to Achieve Goals: Fair    Frequency Min 3X/week   Barriers to discharge Decreased caregiver support Does not have 24/7    Co-evaluation               AM-PAC PT "6 Clicks" Mobility  Outcome Measure Help needed turning from your back to your side while in a flat bed without using bedrails?: A Lot Help needed moving from lying on your back to sitting on the side of a flat bed without using bedrails?: A Lot Help needed moving to and from a bed to a chair (including a wheelchair)?: A Lot Help needed standing up from a chair using your arms (e.g., wheelchair or bedside chair)?: A Lot Help needed to walk in hospital room?: Total Help needed climbing 3-5 steps with a railing? : Total 6 Click Score: 10    End of Session Equipment Utilized During Treatment: Gait belt;Other (comment);Oxygen([place]) Activity Tolerance: Patient limited by fatigue;Patient limited by pain Patient left: in bed;with call bell/phone within reach;with family/visitor present Nurse Communication: Mobility status;Need for lift equipment PT Visit Diagnosis: Muscle weakness (generalized) (M62.81);History of falling (Z91.81);Difficulty in walking, not elsewhere classified (R26.2);Pain Pain - Right/Left: Left Pain - part of body: Knee    Time: [id number] PT Time Calculation  (min) (ACUTE ONLY): 20 min   Charges:   PT Evaluation $PT Eval Low Complexity: 1 Low          [place] Pager [phone] Office [phone]   Ajmal Kathan 07/06/2019, 10:28 AM

## 2019-07-06 NOTE — ED Notes (Signed)
Pt sitting up in bed awake. NAD noted. Family at bedside. Will continue to monitor.

## 2019-07-06 NOTE — ED Notes (Signed)
Transport called.

## 2019-07-06 NOTE — ED Provider Notes (Signed)
[facility] DEPT Provider Note: Tamara Spurling, MD, FACEP  CSN: [id number] MRN: [id number] ARRIVAL: 07/06/19 at [place]: [facility]   CHIEF COMPLAINT  Knee Pain   HISTORY OF PRESENT ILLNESS  07/06/19 1:25 AM [name] is a 85 y.o. female who had right eye surgery yesterday and was somnolent postoperatively.  She fell to her knees yesterday while being assisted by a family member.  She subsequently has developed pain in her left knee which she states is severe enough she is not able to bear weight on her left leg.  She denies other injury.  Pain in her left knee is also worse with movement of the left hip.   Past Medical History:  Diagnosis Date  . Abnormality of gait   . Arthritis   . Bifascicular bundle branch block    chronic  . Degenerative spinal arthritis    cervical and lumbar  . Dementia    early onset  . Dyspnea on exertion   . GERD (gastroesophageal reflux disease)   . History of CVA (cerebrovascular accident)    03-31-2012--  right cerebellar infart without hemorrhgia  . History of gastric ulcer    2011  w/ bleed  . History of kidney stones   . History of squamous cell carcinoma excision   . Hyperlipidemia   . Hypertension   . Hypothyroidism   . Obstructive sleep apnea    has not been using -- but states has appointment soon to get refitted   . Polymyalgia rheumatica ([place])   . Stroke ([place])   . Type 2 diabetes mellitus ([place])   . Urge urinary incontinence    refactory    Past Surgical History:  Procedure Laterality Date  . APPENDECTOMY  as child  . HERNIA REPAIR  1956  . INTERSTIM IMPLANT PLACEMENT N/A 11/19/2014   Procedure: Barrie Lyme IMPLANT FIRST STAGE AND ;  Surgeon: Bjorn Loser, MD;  Location: [facility];  Service: Urology;  Laterality: N/A;  . INTERSTIM IMPLANT PLACEMENT N/A 11/19/2014   Procedure: Barrie Lyme IMPLANT SECOND STAGE;  Surgeon: Bjorn Loser, MD;  Location: [facility];  Service: Urology;   Laterality: N/A;  . ORIF RIGHT WRIST FX  03-04-2004  . SHOULDER ARTHROSCOPY / DEBRIDEMENT/  SAD/  DCR/  MINI OPEN ROTATOR CUFF REPAIR Right 01-09-2003  . TEE WITHOUT CARDIOVERSION N/A 09/12/2012   Procedure: TRANSESOPHAGEAL ECHOCARDIOGRAM (TEE);  Surgeon: Lelon Perla, MD;  Location: [facility] ENDOSCOPY;  Service: Cardiovascular;  Laterality: N/A;  proximal septal thickening and chordal SAM creatin a narrow LVOT; turbulence noted  . TONSILLECTOMY  as child  . TOTAL KNEE ARTHROPLASTY Left 11-12-2004  . TRANSTHORACIC ECHOCARDIOGRAM  09-10-2012   mild LVH, mild  focal basal hypertrophy of the septum,  ef [id number], grade I diastolic dysfunction/  trival AR /  MV with moderate systolic anterior motion of the chordal structures/  mild LAE    Family History  Problem Relation Age of Onset  . Heart failure Mother     Social History   Tobacco Use  . Smoking status: Former Smoker    Quit date: 11/02/2004    Years since quitting: 14.6  . Smokeless tobacco: Never Used  Substance Use Topics  . Alcohol use: No  . Drug use: No    Prior to Admission medications   Medication Sig Start Date End Date Taking? Authorizing Provider  acetaminophen (TYLENOL) 325 MG tablet Take 650 mg by mouth every 6 (six) hours as needed.    [provider]  atorvastatin (LIPITOR) 40 MG tablet Take 1 tablet (40 mg total) by mouth daily at 6 PM. Keep upcoming visit for further refills, thanks. 09/02/17   [place], Peter M, MD  Cholecalciferol 5000 UNITS capsule Take 1 capsule (5,000 Units total) by mouth daily. Patient taking differently: Take 5,000 Units by mouth every morning.  04/10/12   Angiulli, Lavon Paganini, PA-C  clopidogrel (PLAVIX) 75 MG tablet Take 1 tablet (75 mg total) by mouth daily with breakfast. 09/12/12   Dhungel, Nishant, MD  donepezil (ARICEPT) 5 MG tablet Take 1 tablet (5 mg total) by mouth at bedtime. 04/10/12   Angiulli, Lavon Paganini, PA-C  DULoxetine (CYMBALTA) 60 MG capsule Take 60 mg by mouth every morning.     [provider]  Fluticasone Furoate-Vilanterol (BREO ELLIPTA) 100-25 MCG/INH AEPB Inhale into the lungs every evening.     [provider]  folic acid (FOLVITE) 1 MG tablet Take 1 tablet (1 mg total) by mouth 2 (two) times daily. 04/10/12   Angiulli, Lavon Paganini, PA-C  glimepiride (AMARYL) 1 MG tablet Take 1 mg by mouth daily with breakfast.    [provider]  irbesartan (AVAPRO) 75 MG tablet Take 1 tablet (75 mg total) by mouth daily. 09/06/17   Almyra Deforest, PA  lamoTRIgine (LAMICTAL) 150 MG tablet Take 150 mg by mouth every morning.    [provider]  levothyroxine (SYNTHROID, LEVOTHROID) 100 MCG tablet Take 1 tablet (100 mcg total) by mouth daily. 04/10/12   Angiulli, Lavon Paganini, PA-C  Lurasidone HCl (LATUDA) 20 MG TABS Take 1 tablet by mouth every evening.    [provider]  vitamin B-12 (CYANOCOBALAMIN) 1000 MCG tablet Take 1 tablet (1,000 mcg total) by mouth daily. Patient taking differently: Take 1,000 mcg by mouth every morning.  04/10/12   Angiulli, Lavon Paganini, PA-C    Allergies Adhesive [tape]   REVIEW OF SYSTEMS  Negative except as noted here or in the History of Present Illness.   PHYSICAL EXAMINATION  Initial Vital Signs Blood pressure 136/80, pulse 83, temperature 98.1 F (36.7 C), resp. rate 16, SpO2 94 %.  Examination General: Well-developed, well-nourished female in no acute distress; appearance consistent with age of record HENT: normocephalic; atraumatic Eyes: Left pupil round and reactive to light; left eye extraocular muscles intact; right eye patched Neck: supple Heart: regular rate and rhythm Lungs: clear to auscultation bilaterally Abdomen: soft; nondistended; nontender; bowel sounds present Extremities: Left lower extremity externally rotated without significant shortening; pain in left knee on passive movement of left hip; pulses normal Neurologic: Awake, alert, somewhat confused about recent events; motor function  intact in all extremities and symmetric; no facial droop Skin: Warm and dry Psychiatric: Normal mood and affect   RESULTS  Summary of this visit's results, reviewed and interpreted by myself:   EKG Interpretation  Date/Time:    Ventricular Rate:    PR Interval:    QRS Duration:   QT Interval:    QTC Calculation:   R Axis:     Text Interpretation:        Laboratory Studies: Results for orders placed or performed during the hospital encounter of 07/06/19 (from the past 24 hour(s))  CBC with Differential/Platelet     Status: Abnormal   Collection Time: 07/06/19  4:40 AM  Result Value Ref Range   WBC 10.8 (H) 4.0 - 10.5 K/uL   RBC 4.14 3.87 - 5.11 MIL/uL   Hemoglobin 13.0 12.0 - 15.0 g/dL   HCT 41.4  36.0 - 46.0 %   MCV 100.0 80.0 - 100.0 fL   MCH 31.4 26.0 - 34.0 pg   MCHC 31.4 30.0 - 36.0 g/dL   RDW 12.9 11.5 - 15.5 %   Platelets 246 150 - 400 K/uL   nRBC 0.0 0.0 - 0.2 %   Neutrophils Relative % 69 %   Neutro Abs 7.4 1.7 - 7.7 K/uL   Lymphocytes Relative 20 %   Lymphs Abs 2.2 0.7 - 4.0 K/uL   Monocytes Relative 9 %   Monocytes Absolute 1.0 0.1 - 1.0 K/uL   Eosinophils Relative 1 %   Eosinophils Absolute 0.1 0.0 - 0.5 K/uL   Basophils Relative 0 %   Basophils Absolute 0.0 0.0 - 0.1 K/uL   Immature Granulocytes 1 %   Abs Immature Granulocytes 0.05 0.00 - 0.07 K/uL  Basic metabolic panel     Status: Abnormal   Collection Time: 07/06/19  4:40 AM  Result Value Ref Range   Sodium 137 135 - 145 mmol/L   Potassium 4.5 3.5 - 5.1 mmol/L   Chloride 102 98 - 111 mmol/L   CO2 24 22 - 32 mmol/L   Glucose, Bld 134 (H) 70 - 99 mg/dL   BUN 28 (H) 8 - 23 mg/dL   Creatinine, Ser 1.46 (H) 0.44 - 1.00 mg/dL   Calcium 9.4 8.9 - 10.3 mg/dL   GFR calc non Af Amer 33 (L) >60 mL/min   GFR calc Af Amer 38 (L) >60 mL/min   Anion gap 11 5 - 15   Imaging Studies: CT PELVIS WO CONTRAST  Result Date: 07/06/2019 CLINICAL DATA:  Left hip pain after fall. EXAM: CT PELVIS WITHOUT CONTRAST  TECHNIQUE: Multidetector CT imaging of the pelvis was performed following the standard protocol without intravenous contrast. COMPARISON:  None. FINDINGS: Urinary Tract:  Collapsed urinary bladder. Bowel:  No evidence of inflammation or obstruction. Vascular/Lymphatic: Diffuse atherosclerotic plaque. Reproductive:  Small calcified fibroid. Other:  Negative for ascites. Musculoskeletal: No visible fracture.  No dislocation. Lower lumbar disc narrowing and facet arthropathy. Sacral stimulator on the right. IMPRESSION: No malalignment or visible fracture. Electronically Signed   By: Monte Fantasia M.D.   On: 07/06/2019 04:21   DG Knee Complete 4 Views Left  Result Date: 07/06/2019 CLINICAL DATA:  Fall pain to the knee EXAM: LEFT KNEE - COMPLETE 4+ VIEW COMPARISON:  None. FINDINGS: The patient is status post left total knee arthroplasty. No periprosthetic lucency or fracture is identified. There is a small knee joint effusion. There is diffuse osteopenia. Scattered vascular calcifications are noted. IMPRESSION: Status post left total knee arthroplasty without complication. Electronically Signed   By: Prudencio Pair M.D.   On: 07/06/2019 01:49   DG Hip Unilat W or Wo Pelvis 2-3 Views Left  Result Date: 07/06/2019 CLINICAL DATA:  Fall and pain EXAM: DG HIP (WITH OR WITHOUT PELVIS) 2-3V LEFT COMPARISON:  None. FINDINGS: Limited examination due to technique. No definite fracture seen. There is moderate bilateral hip osteoarthritis with superior joint space loss. Diffuse osteopenia is noted. IMPRESSION: No definite fracture. If there is high clinical suspicion for occult hip fracture or the patient refuses to weightbear, consider further evaluation with cross-sectional imaging. Electronically Signed   By: Prudencio Pair M.D.   On: 07/06/2019 01:50    ED COURSE and MDM  Nursing notes, initial and subsequent vitals signs, including pulse oximetry, reviewed and interpreted by myself.  Vitals:   07/06/19 0115  07/06/19 0238 07/06/19 0509  BP: 136/80  126/73 (!) 120/57  Pulse: 83 77 77  Resp: 16 16 16   Temp: 98.1 F (36.7 C)    SpO2: 94% 95% 95%   Medications - No data to display  4:27 AM The son advises that his mother did not so much fall and injure herself as she was too weak to stand and in the process of attempting to help her ambulate developed pain in her left knee.  As noted on the radiographs and physical examination there is no obvious sign of fracture or significant injury.  The patient's son states he is not capable of caring for her in her current state and would like to have her admitted for weakness.  PROCEDURES  Procedures   ED DIAGNOSES     ICD-10-CM   1. Acute pain of left knee  M25.562   2. Weakness of both lower extremities  R29.898   3. Status post eye surgery  Z98.890        Shanon Rosser, MD 07/06/19 [id number]

## 2019-07-06 NOTE — Evaluation (Signed)
Occupational Therapy Evaluation Patient Details Name: [name] MRN: [id number] DOB: Aug 24, 1934 Today's Date: 07/06/2019    History of Present Illness Pt admitted s/p fall at home and with hx of DM, CVA, Dementia, polymyalgia Rheumatica, L TKR and Eye surgery 07/05/19.  Imaging reports no fxs   Clinical Impression   Patient with functional deficits listed below impacting safety and independence with self care. Per patient and PT eval patient was home alone during the day as son worked and was modified independent with self care and functional transfers. Currently patient requires mod to max A x1 for repositioning/bed mobility with report 8/10 pain in back and L knee limiting mobility. Will continue to follow.    Follow Up Recommendations  SNF    Equipment Recommendations  Other (comment)(TBD)       Precautions / Restrictions Precautions Precautions: Fall Precaution Comments: Pt must sit up 2* eye surgery Restrictions Weight Bearing Restrictions: No      Mobility Bed Mobility Overal bed mobility: Needs Assistance Bed Mobility: (scooting up in bed and laterally)           General bed mobility comments: patient requires 1 step commands with increased time and tactile cues for carry over with mod A to scoot up to head of bed and max A to scoot L laterally  Transfers                 General transfer comment: deferred due to current 8/10 pain level and for safety as pt required extensive assist x2 with PT        ADL either performed or assessed with clinical judgement   ADL Overall ADL's : Needs assistance/impaired Eating/Feeding: Set up;Bed level   Grooming: Set up;Bed level   Upper Body Bathing: Maximal assistance   Lower Body Bathing: Total assistance   Upper Body Dressing : Maximal assistance   Lower Body Dressing: Total assistance   Toilet Transfer: +2 for safety/equipment;+2 for physical assistance Toilet Transfer Details (indicate cue type and  reason): anticiapte x2 assist as patient requires mod to max A for bed mobility/repositioning. Toileting- Clothing Manipulation and Hygiene: Total assistance         General ADL Comments: Patient requires increased assistance with self care due to pain, decreased activity tolerance, safety awareness,     Vision   Additional Comments: Recent R eye surgery with patch over R eye            Pertinent Vitals/Pain Pain Assessment: 0-10 Pain Score: 8  Pain Location: back, L knee Pain Descriptors / Indicators: Aching;Sore;Guarding Pain Intervention(s): Limited activity within patient's tolerance     Hand Dominance Right   Extremity/Trunk Assessment Upper Extremity Assessment Upper Extremity Assessment: Generalized weakness   Lower Extremity Assessment Lower Extremity Assessment: Defer to PT evaluation       Communication Communication Communication: No difficulties   Cognition Arousal/Alertness: Awake/alert Behavior During Therapy: WFL for tasks assessed/performed Overall Cognitive Status: No family/caregiver present to determine baseline cognitive functioning                                 General Comments: patient oriented to self only, stated at [facility] and that it's October 2004. difficulty providing details of fall and about her R eye.              Home Living Family/patient expects to be discharged to:: Private residence Living Arrangements: (son) Available Help  at Discharge: Family;Available PRN/intermittently Type of Home: House Home Access: Stairs to enter [employer] of Steps: 5 Entrance Stairs-Rails: Right;Left;Can reach both Home Layout: One level     Bathroom Shower/Tub: Occupational psychologist: Standard     Home Equipment: Environmental consultant - 4 wheels;Walker - 2 wheels;Cane - single point   Additional Comments: Son works days and stays with pt nights. History taken from PT eval.      Prior  Functioning/Environment Level of Independence: Independent with assistive device(s)        Comments: Used rollator         OT Problem List: Decreased strength;Decreased activity tolerance;Impaired balance (sitting and/or standing);Decreased safety awareness;Decreased knowledge of use of DME or AE;Obesity;Pain      OT Treatment/Interventions: Self-care/ADL training;Therapeutic exercise;Energy conservation;DME and/or AE instruction;Therapeutic activities;Patient/family education;Balance training    OT Goals(Current goals can be found in the care plan section) Acute Rehab OT Goals Patient Stated Goal: Get better OT Goal Formulation: With patient Time For Goal Achievement: 07/20/19 Potential to Achieve Goals: Good  OT Frequency: Min 2X/week    AM-PAC OT "6 Clicks" Daily Activity     Outcome Measure Help from another person eating meals?: A Little Help from another person taking care of personal grooming?: A Little Help from another person toileting, which includes using toliet, bedpan, or urinal?: Total Help from another person bathing (including washing, rinsing, drying)?: A Lot Help from another person to put on and taking off regular upper body clothing?: A Lot Help from another person to put on and taking off regular lower body clothing?: Total 6 Click Score: 12   End of Session Nurse Communication: Mobility status  Activity Tolerance: Patient tolerated treatment well Patient left: in bed;with call bell/phone within reach  OT Visit Diagnosis: Other abnormalities of gait and mobility (R26.89);Muscle weakness (generalized) (M62.81);History of falling (Z91.81);Pain Pain - Right/Left: Left Pain - part of body: Knee(back)                Time: [id number] OT Time Calculation (min): 20 min Charges:  OT General Charges $OT Visit: 1 Visit OT Evaluation $OT Eval Moderate Complexity: [place] OT Pager: [place] 07/06/2019, 2:58 PM

## 2019-07-06 NOTE — ED Notes (Signed)
Pt sitting up in bed. MD at bedside. Pt updated on plan of care. Will continue to monitor.

## 2019-07-07 ENCOUNTER — Observation Stay (HOSPITAL_COMMUNITY): Payer: Medicare Other

## 2019-07-07 DIAGNOSIS — I10 Essential (primary) hypertension: Secondary | ICD-10-CM | POA: Diagnosis not present

## 2019-07-07 DIAGNOSIS — J449 Chronic obstructive pulmonary disease, unspecified: Secondary | ICD-10-CM | POA: Diagnosis present

## 2019-07-07 DIAGNOSIS — Z87442 Personal history of urinary calculi: Secondary | ICD-10-CM | POA: Diagnosis not present

## 2019-07-07 DIAGNOSIS — Z7401 Bed confinement status: Secondary | ICD-10-CM | POA: Diagnosis not present

## 2019-07-07 DIAGNOSIS — I454 Nonspecific intraventricular block: Secondary | ICD-10-CM | POA: Diagnosis present

## 2019-07-07 DIAGNOSIS — R52 Pain, unspecified: Secondary | ICD-10-CM | POA: Diagnosis not present

## 2019-07-07 DIAGNOSIS — I639 Cerebral infarction, unspecified: Secondary | ICD-10-CM | POA: Diagnosis not present

## 2019-07-07 DIAGNOSIS — R0602 Shortness of breath: Secondary | ICD-10-CM | POA: Diagnosis not present

## 2019-07-07 DIAGNOSIS — M25562 Pain in left knee: Secondary | ICD-10-CM | POA: Diagnosis present

## 2019-07-07 DIAGNOSIS — Z20822 Contact with and (suspected) exposure to covid-19: Secondary | ICD-10-CM | POA: Diagnosis present

## 2019-07-07 DIAGNOSIS — E860 Dehydration: Secondary | ICD-10-CM | POA: Diagnosis present

## 2019-07-07 DIAGNOSIS — F0390 Unspecified dementia without behavioral disturbance: Secondary | ICD-10-CM | POA: Diagnosis present

## 2019-07-07 DIAGNOSIS — M2550 Pain in unspecified joint: Secondary | ICD-10-CM | POA: Diagnosis not present

## 2019-07-07 DIAGNOSIS — R410 Disorientation, unspecified: Secondary | ICD-10-CM | POA: Diagnosis not present

## 2019-07-07 DIAGNOSIS — Z8673 Personal history of transient ischemic attack (TIA), and cerebral infarction without residual deficits: Secondary | ICD-10-CM | POA: Diagnosis not present

## 2019-07-07 DIAGNOSIS — N179 Acute kidney failure, unspecified: Secondary | ICD-10-CM | POA: Diagnosis present

## 2019-07-07 DIAGNOSIS — Z8711 Personal history of peptic ulcer disease: Secondary | ICD-10-CM | POA: Diagnosis not present

## 2019-07-07 DIAGNOSIS — E1165 Type 2 diabetes mellitus with hyperglycemia: Secondary | ICD-10-CM | POA: Diagnosis present

## 2019-07-07 DIAGNOSIS — I959 Hypotension, unspecified: Secondary | ICD-10-CM | POA: Diagnosis not present

## 2019-07-07 DIAGNOSIS — Z87891 Personal history of nicotine dependence: Secondary | ICD-10-CM | POA: Diagnosis not present

## 2019-07-07 DIAGNOSIS — I129 Hypertensive chronic kidney disease with stage 1 through stage 4 chronic kidney disease, or unspecified chronic kidney disease: Secondary | ICD-10-CM | POA: Diagnosis present

## 2019-07-07 DIAGNOSIS — E039 Hypothyroidism, unspecified: Secondary | ICD-10-CM | POA: Diagnosis present

## 2019-07-07 DIAGNOSIS — Z9889 Other specified postprocedural states: Secondary | ICD-10-CM | POA: Diagnosis not present

## 2019-07-07 DIAGNOSIS — K219 Gastro-esophageal reflux disease without esophagitis: Secondary | ICD-10-CM | POA: Diagnosis present

## 2019-07-07 DIAGNOSIS — W19XXXD Unspecified fall, subsequent encounter: Secondary | ICD-10-CM | POA: Diagnosis not present

## 2019-07-07 DIAGNOSIS — R531 Weakness: Secondary | ICD-10-CM | POA: Diagnosis not present

## 2019-07-07 DIAGNOSIS — N1831 Chronic kidney disease, stage 3a: Secondary | ICD-10-CM | POA: Diagnosis present

## 2019-07-07 DIAGNOSIS — R0902 Hypoxemia: Secondary | ICD-10-CM | POA: Diagnosis not present

## 2019-07-07 DIAGNOSIS — E119 Type 2 diabetes mellitus without complications: Secondary | ICD-10-CM | POA: Diagnosis not present

## 2019-07-07 DIAGNOSIS — M353 Polymyalgia rheumatica: Secondary | ICD-10-CM | POA: Diagnosis present

## 2019-07-07 DIAGNOSIS — R4182 Altered mental status, unspecified: Secondary | ICD-10-CM | POA: Diagnosis not present

## 2019-07-07 DIAGNOSIS — E1122 Type 2 diabetes mellitus with diabetic chronic kidney disease: Secondary | ICD-10-CM | POA: Diagnosis present

## 2019-07-07 DIAGNOSIS — M1990 Unspecified osteoarthritis, unspecified site: Secondary | ICD-10-CM | POA: Diagnosis present

## 2019-07-07 DIAGNOSIS — E785 Hyperlipidemia, unspecified: Secondary | ICD-10-CM | POA: Diagnosis present

## 2019-07-07 DIAGNOSIS — N3941 Urge incontinence: Secondary | ICD-10-CM | POA: Diagnosis present

## 2019-07-07 DIAGNOSIS — G4733 Obstructive sleep apnea (adult) (pediatric): Secondary | ICD-10-CM | POA: Diagnosis present

## 2019-07-07 DIAGNOSIS — W050XXA Fall from non-moving wheelchair, initial encounter: Secondary | ICD-10-CM | POA: Diagnosis present

## 2019-07-07 DIAGNOSIS — W19XXXA Unspecified fall, initial encounter: Secondary | ICD-10-CM | POA: Diagnosis not present

## 2019-07-07 LAB — CBC
HCT: 41.1 % (ref 36.0–46.0)
Hemoglobin: 12.9 g/dL (ref 12.0–15.0)
MCH: 31.1 pg (ref 26.0–34.0)
MCHC: 31.4 g/dL (ref 30.0–36.0)
MCV: 99 fL (ref 80.0–100.0)
Platelets: 234 10*3/uL (ref 150–400)
RBC: 4.15 MIL/uL (ref 3.87–5.11)
RDW: 12.8 % (ref 11.5–15.5)
WBC: 8.9 10*3/uL (ref 4.0–10.5)
nRBC: 0 % (ref 0.0–0.2)

## 2019-07-07 LAB — PROTIME-INR
INR: 1.1 (ref 0.8–1.2)
Prothrombin Time: 13.5 seconds (ref 11.4–15.2)

## 2019-07-07 LAB — COMPREHENSIVE METABOLIC PANEL
ALT: 14 U/L (ref 0–44)
AST: 16 U/L (ref 15–41)
Albumin: 4.3 g/dL (ref 3.5–5.0)
Alkaline Phosphatase: 47 U/L (ref 38–126)
Anion gap: 11 (ref 5–15)
BUN: 19 mg/dL (ref 8–23)
CO2: 23 mmol/L (ref 22–32)
Calcium: 9.5 mg/dL (ref 8.9–10.3)
Chloride: 103 mmol/L (ref 98–111)
Creatinine, Ser: 0.98 mg/dL (ref 0.44–1.00)
GFR calc Af Amer: >60 mL/min (ref >60)
GFR calc non Af Amer: 54 mL/min — ABNORMAL LOW (ref >60)
Glucose, Bld: 127 mg/dL — ABNORMAL HIGH (ref 70–99)
Potassium: 4 mmol/L (ref 3.5–5.1)
Sodium: 137 mmol/L (ref 135–145)
Total Bilirubin: 1 mg/dL (ref 0.3–1.2)
Total Protein: 7.9 g/dL (ref 6.5–8.1)

## 2019-07-07 LAB — MAGNESIUM: Magnesium: 1.8 mg/dL (ref 1.7–2.4)

## 2019-07-07 LAB — GLUCOSE, CAPILLARY
Glucose-Capillary: 108 mg/dL — ABNORMAL HIGH (ref 70–99)
Glucose-Capillary: 130 mg/dL — ABNORMAL HIGH (ref 70–99)
Glucose-Capillary: 107 mg/dL — ABNORMAL HIGH (ref 70–99)
Glucose-Capillary: 163 mg/dL — ABNORMAL HIGH (ref 70–99)

## 2019-07-07 LAB — PHOSPHORUS: Phosphorus: 2.5 mg/dL (ref 2.5–4.6)

## 2019-07-07 LAB — APTT: aPTT: 39 seconds — ABNORMAL HIGH (ref 24–36)

## 2019-07-07 NOTE — Progress Notes (Signed)
[facility]  PROGRESS NOTE  [name] [id number] DOB: 08/27/1934 DOA: 07/06/2019 PCP: Shon Baton, MD   Brief HPI:    85 year old female with medical history of dementia, diabetes mellitus type 2, hypertension, hypothyroidism, hyperlipidemia, COPD presented to ED with complaints of generalized weakness and fall.  Patient had right eye surgery after that when she went home.  She was not able to get up from wheelchair and landed on her knees with subsequent left knee pain and difficulty bearing weight on left leg. Imaging studies in the ED was unremarkable.  Patient still has significant swelling and pain in the left knee.  X-ray of the knee was unremarkable.    Subjective   Patient seen and examined, denies any complaints.  O2 sats were found to be 87% this morning.  She has been getting IV normal saline at 100/h for hypotension she developed yesterday.  Hypotension has now resolved.   Assessment/Plan:     1. Generalized weakness-patient had accidental fall after the eye surgery.  She landed on her left knee and felt pain.  PT OT was consulted.  Plan to go to skilled nursing facility for rehab.  CT head was negative for acute abnormality. 2. Left knee pain-x-ray of left knee shows no acute abnormality.  Improved, continue cold compresses.  Avoid NSAIDs due to history of CKD stage III.  Continue Tylenol as needed. 3. Hypoxia-patient had 1 episode of O2 sats 87% on room air.  Chest x-ray obtained this morning showed no active disease.  Patient is currently not requiring oxygen.  Will monitor off IV fluids. 4. Hypertension-continue Avapro 5. Hyperlipidemia-continue statin 6. Diabetes mellitus type 2-continue sliding scale insulin with NovoLog. 7. History of CVA-continue Plavix and statin. 8. Acute kidney injury-patient presented with creatinine of 1.46, now improved to 0.98 with IV hydration.  Will discontinue IV fluids.    SpO2: 94 % O2 Flow Rate (L/min): 2 L/min    COVID-19 Labs  No results for input(s): DDIMER, FERRITIN, LDH, CRP in the last 72 hours.  Lab Results  Component Value Date   [place] NEGATIVE 07/06/2019     CBG: Recent Labs  Lab 07/06/19 1154 07/06/19 1638 07/06/19 2147 07/07/19 0736 07/07/19 1136  GLUCAP 92 110* 102* 108* 130*    CBC: Recent Labs  Lab 07/06/19 0440 07/07/19 0607  WBC 10.8* 8.9  NEUTROABS 7.4  --   HGB 13.0 12.9  HCT 41.4 41.1  MCV 100.0 99.0  PLT 246 [id number]    Basic Metabolic Panel: Recent Labs  Lab 07/06/19 0440 07/07/19 0607  NA 137 137  K 4.5 4.0  CL 102 103  CO2 24 23  GLUCOSE 134* 127*  BUN 28* 19  CREATININE 1.46* 0.98  CALCIUM 9.4 9.5  MG  --  1.8  PHOS  --  2.5     Liver Function Tests: Recent Labs  Lab 07/07/19 0607  AST 16  ALT 14  ALKPHOS 47  BILITOT 1.0  PROT 7.9  ALBUMIN 4.3        DVT prophylaxis: Heparin  Code Status: Full code  Family Communication: No family at bedside  Disposition Plan:   Status is: Inpatient  Dispo: The patient is from: Home              Anticipated d/c is to: Skilled nursing facility              Anticipated d/c date is: 07/09/2019  Patient currently admitted for generalized weakness, left knee pain after a fall.  Barrier to discharge-patient will need skilled nursing facility        Scheduled medications:  . cholecalciferol  1,000 Units Oral Daily  . clopidogrel  75 mg Oral Q breakfast  . donepezil  5 mg Oral QHS  . DULoxetine  60 mg Oral q morning - 10a  . fluticasone furoate-vilanterol  1 puff Inhalation Daily  . folic acid  1 mg Oral BID  . gatifloxacin  1 drop Right Eye QID  . heparin  5,000 Units Subcutaneous Q8H  . insulin aspart  0-5 Units Subcutaneous QHS  . insulin aspart  0-9 Units Subcutaneous TID WC  . lamoTRIgine  150 mg Oral q morning - 10a  . levothyroxine  100 mcg Oral Q0600  . lurasidone  40 mg Oral QPM  . prednisoLONE acetate  1 drop Right Eye QID  . vitamin B-12  1,000 mcg  Oral Daily    Consultants:    Procedures:    Antibiotics:   Anti-infectives (From admission, onward)   None       Objective   Vitals:   07/07/19 0732 07/07/19 0823 07/07/19 0906 07/07/19 1133  BP: 140/90   115/74  Pulse: 91 86  87  Resp: 20   15  Temp: 97.9 F (36.6 C)   98.2 F (36.8 C)  TempSrc: Oral   Oral  SpO2: (!) 87% 99% 95% 94%    Intake/Output Summary (Last 24 hours) at 07/07/2019 1207 Last data filed at 07/07/2019 0900 Gross per 24 hour  Intake 1036 ml  Output 502 ml  Net 534 ml    05/13 1901 - 05/15 0700 In: 800 [I.V.:800] Out: 502 [Urine:502]  There were no vitals filed for this visit.  Physical Examination:   General-appears in no acute distress Heart-S1-S2, regular, no murmur auscultated Lungs-clear to auscultation bilaterally, no wheezing or crackles auscultated Abdomen-soft, nontender, no organomegaly Extremities-no edema in the lower extremities Neuro-alert, oriented x3, no focal deficit noted Musculoskeletal-left knee mildly swollen, nontender.   Data Reviewed:   Recent Results (from the past 240 hour(s))  SARS Coronavirus 2 by RT PCR (hospital order, performed in [facility] hospital lab) Nasopharyngeal Nasopharyngeal Swab     Status: None   Collection Time: 07/06/19  6:35 AM   Specimen: Nasopharyngeal Swab  Result Value Ref Range Status   SARS Coronavirus 2 NEGATIVE NEGATIVE Final    Comment: (NOTE) SARS-CoV-2 target nucleic acids are NOT DETECTED. The SARS-CoV-2 RNA is generally detectable in upper and lower respiratory specimens during the acute phase of infection. The lowest concentration of SARS-CoV-2 viral copies this assay can detect is 250 copies / mL. A negative result does not preclude SARS-CoV-2 infection and should not be used as the sole basis for treatment or other patient management decisions.  A negative result may occur with improper specimen collection / handling, submission of specimen other than  nasopharyngeal swab, presence of viral mutation(s) within the areas targeted by this assay, and inadequate number of viral copies (<250 copies / mL). A negative result must be combined with clinical observations, patient history, and epidemiological information. Fact Sheet for Patients:   StrictlyIdeas.no Fact Sheet for Healthcare Providers: BankingDealers.co.za This test is not yet approved or cleared  by the [place] FDA and has been authorized for detection and/or diagnosis of SARS-CoV-2 by FDA under an Emergency Use Authorization (EUA).  This EUA will remain in effect (meaning this test can be used)  for the duration of the COVID-19 declaration under Section 564(b)(1) of the Act, 21 U.S.C. section 360bbb-3(b)(1), unless the authorization is terminated or revoked sooner. Performed at [facility], [place] [address], [place], [place] [address]      Studies:  CT PELVIS WO CONTRAST  Result Date: 07/06/2019 CLINICAL DATA:  Left hip pain after fall. EXAM: CT PELVIS WITHOUT CONTRAST TECHNIQUE: Multidetector CT imaging of the pelvis was performed following the standard protocol without intravenous contrast. COMPARISON:  None. FINDINGS: Urinary Tract:  Collapsed urinary bladder. Bowel:  No evidence of inflammation or obstruction. Vascular/Lymphatic: Diffuse atherosclerotic plaque. Reproductive:  Small calcified fibroid. Other:  Negative for ascites. Musculoskeletal: No visible fracture.  No dislocation. Lower lumbar disc narrowing and facet arthropathy. Sacral stimulator on the right. IMPRESSION: No malalignment or visible fracture. Electronically Signed   By: Monte Fantasia M.D.   On: 07/06/2019 04:21   DG Chest Port 1V same Day  Result Date: 07/07/2019 CLINICAL DATA:  Hypoxia, dementia, diabetes. EXAM: PORTABLE CHEST 1 VIEW COMPARISON:  Chest x-ray dated 04/28/2010. FINDINGS: Heart size and mediastinal contours are within  normal limits. Lungs are clear. No pleural effusion or pneumothorax is seen. Osseous structures about the chest are unremarkable. IMPRESSION: No active disease. No evidence of pneumonia or pulmonary edema. Electronically Signed   By: Franki Cabot M.D.   On: 07/07/2019 09:19   DG Knee Complete 4 Views Left  Result Date: 07/06/2019 CLINICAL DATA:  Fall pain to the knee EXAM: LEFT KNEE - COMPLETE 4+ VIEW COMPARISON:  None. FINDINGS: The patient is status post left total knee arthroplasty. No periprosthetic lucency or fracture is identified. There is a small knee joint effusion. There is diffuse osteopenia. Scattered vascular calcifications are noted. IMPRESSION: Status post left total knee arthroplasty without complication. Electronically Signed   By: Prudencio Pair M.D.   On: 07/06/2019 01:49   DG Hip Unilat W or Wo Pelvis 2-3 Views Left  Result Date: 07/06/2019 CLINICAL DATA:  Fall and pain EXAM: DG HIP (WITH OR WITHOUT PELVIS) 2-3V LEFT COMPARISON:  None. FINDINGS: Limited examination due to technique. No definite fracture seen. There is moderate bilateral hip osteoarthritis with superior joint space loss. Diffuse osteopenia is noted. IMPRESSION: No definite fracture. If there is high clinical suspicion for occult hip fracture or the patient refuses to weightbear, consider further evaluation with cross-sectional imaging. Electronically Signed   By: Prudencio Pair M.D.   On: 07/06/2019 01:50       [place]   [facility] If 7PM-7AM, please contact night-coverage at [email or web address], Office  [phone]   07/07/2019, 12:07 PM  LOS: 0 days

## 2019-07-08 DIAGNOSIS — M25562 Pain in left knee: Secondary | ICD-10-CM

## 2019-07-08 DIAGNOSIS — E785 Hyperlipidemia, unspecified: Secondary | ICD-10-CM

## 2019-07-08 LAB — GLUCOSE, CAPILLARY
Glucose-Capillary: 111 mg/dL — ABNORMAL HIGH (ref 70–99)
Glucose-Capillary: 171 mg/dL — ABNORMAL HIGH (ref 70–99)
Glucose-Capillary: 94 mg/dL (ref 70–99)
Glucose-Capillary: 128 mg/dL — ABNORMAL HIGH (ref 70–99)

## 2019-07-08 NOTE — Progress Notes (Signed)
[facility]  PROGRESS NOTE  [name] [id number] DOB: 1934-03-22 DOA: 07/06/2019 PCP: Shon Baton, MD   Brief HPI:    85 year old female with medical history of dementia, diabetes mellitus type 2, hypertension, hypothyroidism, hyperlipidemia, COPD presented to ED with complaints of generalized weakness and fall.  Patient had right eye surgery after that when she went home.  She was not able to get up from wheelchair and landed on her knees with subsequent left knee pain and difficulty bearing weight on left leg. Imaging studies in the ED was unremarkable.  Patient still has significant swelling and pain in the left knee.  X-ray of the knee was unremarkable.    Subjective   Patient seen and examined, denies any complaints.  PT recommends her to go to skilled nursing facility for rehab.   Assessment/Plan:     1. Generalized weakness-patient had accidental fall after the eye surgery.  She landed on her left knee and felt pain.  PT OT was consulted.  Plan to go to skilled nursing facility for rehab.  CT head was negative for acute abnormality. 2. Left knee pain-x-ray of left knee shows no acute abnormality.  Improved, continue cold compresses.  Avoid NSAIDs due to history of CKD stage III.  Continue Tylenol as needed. 3. Hypoxia-patient had 1 episode of O2 sats 87% on room air.  Chest x-ray obtained this morning showed no active disease.  Patient is currently not requiring oxygen.  Will monitor off IV fluids. 4. Hypertension-continue Avapro 5. Hyperlipidemia-continue statin 6. Diabetes mellitus type 2-continue sliding scale insulin with NovoLog. 7. History of CVA-continue Plavix and statin. 8. Acute kidney injury-patient presented with creatinine of 1.46, now improved to 0.98 with IV hydration.  Will discontinue IV fluids.       Lab Results  Component Value Date   [place] NEGATIVE 07/06/2019     CBG: Recent Labs  Lab 07/07/19 1136 07/07/19 1543  07/07/19 2128 07/08/19 0740 07/08/19 1131  GLUCAP 130* 107* 163* 111* 171*    CBC: Recent Labs  Lab 07/06/19 0440 07/07/19 0607  WBC 10.8* 8.9  NEUTROABS 7.4  --   HGB 13.0 12.9  HCT 41.4 41.1  MCV 100.0 99.0  PLT 246 Q000111Q    Basic Metabolic Panel: Recent Labs  Lab 07/06/19 0440 07/07/19 0607  NA 137 137  K 4.5 4.0  CL 102 103  CO2 24 23  GLUCOSE 134* 127*  BUN 28* 19  CREATININE 1.46* 0.98  CALCIUM 9.4 9.5  MG  --  1.8  PHOS  --  2.5     Liver Function Tests: Recent Labs  Lab 07/07/19 0607  AST 16  ALT 14  ALKPHOS 47  BILITOT 1.0  PROT 7.9  ALBUMIN 4.3        DVT prophylaxis: Heparin  Code Status: Full code  Family Communication: No family at bedside  Disposition Plan:   Status is: Inpatient  Dispo: The patient is from: Home              Anticipated d/c is to: Skilled nursing facility              Anticipated d/c date is: 07/09/2019              Patient currently admitted for generalized weakness, left knee pain after a fall.  Barrier to discharge-patient will need skilled nursing facility        Scheduled medications:  . cholecalciferol  1,000 Units Oral Daily  . clopidogrel  75 mg Oral Q breakfast  . donepezil  5 mg Oral QHS  . DULoxetine  60 mg Oral q morning - 10a  . fluticasone furoate-vilanterol  1 puff Inhalation Daily  . folic acid  1 mg Oral BID  . gatifloxacin  1 drop Right Eye QID  . heparin  5,000 Units Subcutaneous Q8H  . insulin aspart  0-5 Units Subcutaneous QHS  . insulin aspart  0-9 Units Subcutaneous TID WC  . lamoTRIgine  150 mg Oral q morning - 10a  . levothyroxine  100 mcg Oral Q0600  . lurasidone  40 mg Oral QPM  . prednisoLONE acetate  1 drop Right Eye QID  . vitamin B-12  1,000 mcg Oral Daily    Consultants:    Procedures:    Antibiotics:   Anti-infectives (From admission, onward)   None       Objective   Vitals:   07/07/19 2126 07/08/19 0559 07/08/19 0842 07/08/19 1031  BP: (!)  148/73 (!) 174/68  (!) 144/78  Pulse: 88 78  91  Resp: 20 19    Temp: 98.4 F (36.9 C) 97.7 F (36.5 C)  98 F (36.7 C)  TempSrc:  Oral  Oral  SpO2: 93% 94% 91% 90%    Intake/Output Summary (Last 24 hours) at 07/08/2019 1151 Last data filed at 07/08/2019 0900 Gross per 24 hour  Intake 228 ml  Output --  Net 228 ml    05/14 1901 - 05/16 0700 In: [id number] [P.O.:364; I.V.:800] Out: 502 [Urine:502]  There were no vitals filed for this visit.  Physical Examination:   General-appears in no acute distress Heart-S1-S2, regular, no murmur auscultated Lungs-clear to auscultation bilaterally, no wheezing or crackles auscultated Abdomen-soft, nontender, no organomegaly Extremities-no edema in the lower extremities Neuro-alert, oriented x3, no focal deficit noted   Data Reviewed:   Recent Results (from the past 240 hour(s))  SARS Coronavirus 2 by RT PCR (hospital order, performed in [facility] hospital lab) Nasopharyngeal Nasopharyngeal Swab     Status: None   Collection Time: 07/06/19  6:35 AM   Specimen: Nasopharyngeal Swab  Result Value Ref Range Status   SARS Coronavirus 2 NEGATIVE NEGATIVE Final    Comment: (NOTE) SARS-CoV-2 target nucleic acids are NOT DETECTED. The SARS-CoV-2 RNA is generally detectable in upper and lower respiratory specimens during the acute phase of infection. The lowest concentration of SARS-CoV-2 viral copies this assay can detect is 250 copies / mL. A negative result does not preclude SARS-CoV-2 infection and should not be used as the sole basis for treatment or other patient management decisions.  A negative result may occur with improper specimen collection / handling, submission of specimen other than nasopharyngeal swab, presence of viral mutation(s) within the areas targeted by this assay, and inadequate number of viral copies (<250 copies / mL). A negative result must be combined with clinical observations, patient history, and epidemiological  information. Fact Sheet for Patients:   StrictlyIdeas.no Fact Sheet for Healthcare Providers: BankingDealers.co.za This test is not yet approved or cleared  by the [place] FDA and has been authorized for detection and/or diagnosis of SARS-CoV-2 by FDA under an Emergency Use Authorization (EUA).  This EUA will remain in effect (meaning this test can be used) for the duration of the COVID-19 declaration under Section 564(b)(1) of the Act, 21 U.S.C. section 360bbb-3(b)(1), unless the authorization is terminated or revoked sooner. Performed at [facility], [place] [address], [place], [place] [address]  Studies:  DG Chest Port 1V same Day  Result Date: 07/07/2019 CLINICAL DATA:  Hypoxia, dementia, diabetes. EXAM: PORTABLE CHEST 1 VIEW COMPARISON:  Chest x-ray dated 04/28/2010. FINDINGS: Heart size and mediastinal contours are within normal limits. Lungs are clear. No pleural effusion or pneumothorax is seen. Osseous structures about the chest are unremarkable. IMPRESSION: No active disease. No evidence of pneumonia or pulmonary edema. Electronically Signed   By: Franki Cabot M.D.   On: 07/07/2019 09:19       [place]   [facility] If 7PM-7AM, please contact night-coverage at [email or web address], Office  [phone]   07/08/2019, 11:51 AM  LOS: 1 day

## 2019-07-09 LAB — GLUCOSE, CAPILLARY
Glucose-Capillary: 129 mg/dL — ABNORMAL HIGH (ref 70–99)
Glucose-Capillary: 110 mg/dL — ABNORMAL HIGH (ref 70–99)
Glucose-Capillary: 127 mg/dL — ABNORMAL HIGH (ref 70–99)
Glucose-Capillary: 202 mg/dL — ABNORMAL HIGH (ref 70–99)

## 2019-07-09 MED ORDER — POLYETHYLENE GLYCOL 3350 17 G PO PACK
17.0000 g | PACK | Freq: Every day | ORAL | Status: DC
  Administered 2019-07-09 – 2019-07-12 (×4): 17 g via ORAL
  Filled 2019-07-09 (×4): qty 1

## 2019-07-09 NOTE — [place] FL2 (Signed)
[place] MEDICAID FL2 LEVEL OF CARE SCREENING TOOL     IDENTIFICATION  Patient Name: [name] Birthdate: 12-18-1934 Sex: female Admission Date (Current Location): 07/06/2019  [facility] and [place] Number:  Herbalist and Address:  [facility],  [place] [address], [place]      Provider Number: [id number]  Attending Physician Name and Address:  Oswald Hillock, MD  Relative Name and Phone Number:  Azita Mckenny, son, [place]    Current Level of Care: Hospital Recommended Level of Care: [place] Prior Approval Number:    Date Approved/Denied:   PASRR Number:   [id number]  Discharge Plan: SNF    Current Diagnoses: Patient Active Problem List   Diagnosis Date Noted  . Generalized weakness 07/06/2019  . AKI (acute kidney injury) ([place]) 07/06/2019  . Dehydration 07/06/2019  . Accidental fall 07/06/2019  . Dementia without behavioral disturbance ([place]) 07/06/2019  . TIA (transient ischemic attack) 09/08/2012  . Cerebral infarction ([place]) 04/04/2012  . Cerebellar stroke, acute ([place]) 04/01/2012  . 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 Nausea and vomiting 04/01/2012  . Polymyalgia rheumatica ([place]) 04/01/2012  . Bifascicular block   . Hypertension   . Hyperlipidemia   . Obstructive sleep apnea   . Diabetes mellitus ([place])     Orientation RESPIRATION BLADDER Height & Weight     Self, Situation, Place  Normal External catheter Weight:   Height:     BEHAVIORAL SYMPTOMS/MOOD NEUROLOGICAL BOWEL NUTRITION STATUS  (none) (none) Continent Diet(see d/c summary)  AMBULATORY STATUS COMMUNICATION OF NEEDS Skin   Extensive Assist Verbally Normal                       Personal Care Assistance Level of Assistance  Bathing, Feeding, Dressing Bathing Assistance: Maximum assistance Feeding assistance: Independent Dressing Assistance: Limited assistance     Functional Limitations Info  Sight, Hearing,  Speech Sight Info: Adequate Hearing Info: Adequate Speech Info: Adequate    SPECIAL CARE FACTORS FREQUENCY  PT (By licensed PT), OT (By licensed OT)     PT Frequency: 5X/W OT Frequency: 5X/W            Contractures Contractures Info: Not present    Additional Factors Info  Code Status, Allergies Code Status Info: full Allergies Info: Adhesive-Tape           Current Medications (07/09/2019):  This is the current hospital active medication list Current Facility-Administered Medications  Medication Dose Route Frequency Provider Last Rate Last Admin  . acetaminophen (TYLENOL) tablet 650 mg  650 mg Oral Q6H PRN Adefeso, Oladapo, DO   650 mg at 07/08/19 1044   Or  . acetaminophen (TYLENOL) suppository 650 mg  650 mg Rectal Q6H PRN Adefeso, Oladapo, DO      . albuterol (PROVENTIL) (2.5 MG/3ML) 0.083% nebulizer solution 3 mL  3 mL Inhalation Q4H PRN Adefeso, Oladapo, DO      . cholecalciferol (VITAMIN D3) tablet 1,000 Units  1,000 Units Oral Daily Oswald Hillock, MD   1,000 Units at 07/09/19 0915  . clopidogrel (PLAVIX) tablet 75 mg  75 mg Oral Q breakfast Adefeso, Oladapo, DO   75 mg at 07/09/19 0915  . donepezil (ARICEPT) tablet 5 mg  5 mg Oral QHS Adefeso, Oladapo, DO   5 mg at 07/08/19 2236  . DULoxetine (CYMBALTA) DR capsule 60 mg  60 mg Oral q morning - 10a Darrick Meigs, Marge Duncans, MD   [phone]  mg at 07/09/19 0915  . fluticasone furoate-vilanterol (BREO ELLIPTA) 200-25 MCG/INH 1 puff  1 puff Inhalation Daily Adefeso, Oladapo, DO   1 puff at 07/09/19 0805  . folic acid (FOLVITE) tablet 1 mg  1 mg Oral BID Adefeso, Oladapo, DO   1 mg at 07/09/19 0916  . gatifloxacin (ZYMAXID) 0.5 % ophthalmic drops 1 drop  1 drop Right Eye QID Oswald Hillock, MD   1 drop at 07/09/19 0916  . heparin injection 5,000 Units  5,000 Units Subcutaneous Q8H Adefeso, Oladapo, DO   5,000 Units at 07/09/19 0643  . hydrALAZINE (APRESOLINE) injection 10 mg  10 mg Intravenous Q6H PRN Merlene Laughter F, NP   10 mg at 07/08/19  0704  . insulin aspart (novoLOG) injection 0-5 Units  0-5 Units Subcutaneous QHS Adefeso, Oladapo, DO      . insulin aspart (novoLOG) injection 0-9 Units  0-9 Units Subcutaneous TID WC Adefeso, Oladapo, DO   1 Units at 07/09/19 [id number]  . lamoTRIgine (LAMICTAL) tablet 150 mg  150 mg Oral q morning - 10a Oswald Hillock, MD   150 mg at 07/09/19 0916  . levothyroxine (SYNTHROID) tablet 100 mcg  100 mcg Oral Q0600 Adefeso, Oladapo, DO   100 mcg at 07/09/19 [id number]  . lurasidone (LATUDA) tablet 40 mg  40 mg Oral QPM Oswald Hillock, MD   40 mg at 07/08/19 1820  . prednisoLONE acetate (PRED FORTE) 1 % ophthalmic suspension 1 drop  1 drop Right Eye QID Oswald Hillock, MD   1 drop at 07/09/19 0917  . vitamin B-12 (CYANOCOBALAMIN) tablet 1,000 mcg  1,000 mcg Oral Daily Adefeso, Oladapo, DO   1,000 mcg at 07/09/19 0915     Discharge Medications: Please see discharge summary for a list of discharge medications.  Relevant Imaging Results:  Relevant Lab Results:   Additional Information [place], [place]

## 2019-07-09 NOTE — Progress Notes (Signed)
[facility]  PROGRESS NOTE  [name] [id number] DOB: 10-19-1934 DOA: 07/06/2019 PCP: Shon Baton, MD   Brief HPI:    85 year old female with medical history of dementia, diabetes mellitus type 2, hypertension, hypothyroidism, hyperlipidemia, COPD presented to ED with complaints of generalized weakness and fall.  Patient had right eye surgery after that when she went home.  She was not able to get up from wheelchair and landed on her knees with subsequent left knee pain and difficulty bearing weight on left leg. Imaging studies in the ED was unremarkable.  Patient still has significant swelling and pain in the left knee.  X-ray of the knee was unremarkable.    Subjective   Patient seen and examined, denies left knee pain.  PT has seen the patient and recommend rehab at skilled facility.   Assessment/Plan:     1. Generalized weakness-patient had accidental fall after the eye surgery.  She landed on her left knee and felt pain.  PT/ OT was consulted.  Plan to go to skilled nursing facility for rehab.  CT head was negative for acute abnormality. 2. Left knee pain/swelling x-ray of left knee shows no acute abnormality.  Improved, continue cold compresses.  Avoid NSAIDs due to history of CKD stage III.  Continue Tylenol as needed. 3. Hypoxia-patient had 1 episode of O2 sats 87% on room air.  Chest x-ray obtained this morning showed no active disease.  Patient is currently not requiring oxygen.  Will monitor off IV fluids. 4. Hypertension-continue Avapro 5. Hyperlipidemia-continue statin 6. Diabetes mellitus type 2-continue sliding scale insulin with NovoLog. 7. History of CVA-continue Plavix and statin. 8. Acute kidney injury-patient presented with creatinine of 1.46, now improved to 0.98 with IV hydration.  IV fluids have been discontinued.         Lab Results  Component Value Date   [place] NEGATIVE 07/06/2019     CBG: Recent Labs  Lab 07/08/19 0740  07/08/19 1131 07/08/19 1751 07/08/19 2136 07/09/19 0751  GLUCAP 111* 171* 94 128* 129*    CBC: Recent Labs  Lab 07/06/19 0440 07/07/19 0607  WBC 10.8* 8.9  NEUTROABS 7.4  --   HGB 13.0 12.9  HCT 41.4 41.1  MCV 100.0 99.0  PLT 246 [id number]    Basic Metabolic Panel: Recent Labs  Lab 07/06/19 0440 07/07/19 0607  NA 137 137  K 4.5 4.0  CL 102 103  CO2 24 23  GLUCOSE 134* 127*  BUN 28* 19  CREATININE 1.46* 0.98  CALCIUM 9.4 9.5  MG  --  1.8  PHOS  --  2.5     Liver Function Tests: Recent Labs  Lab 07/07/19 0607  AST 16  ALT 14  ALKPHOS 47  BILITOT 1.0  PROT 7.9  ALBUMIN 4.3        DVT prophylaxis: Heparin  Code Status: Full code  Family Communication: No family at bedside  Disposition Plan:   Status is: Inpatient  Dispo: The patient is from: Home              Anticipated d/c is to: Skilled nursing facility              Anticipated d/c date is: 07/10/2019              Patient currently admitted for generalized weakness, left knee pain after a fall.  Barrier to discharge-patient will need skilled nursing facility for rehab.        Scheduled medications:  . cholecalciferol  1,000 Units Oral Daily  . clopidogrel  75 mg Oral Q breakfast  . donepezil  5 mg Oral QHS  . DULoxetine  60 mg Oral q morning - 10a  . fluticasone furoate-vilanterol  1 puff Inhalation Daily  . folic acid  1 mg Oral BID  . gatifloxacin  1 drop Right Eye QID  . heparin  5,000 Units Subcutaneous Q8H  . insulin aspart  0-5 Units Subcutaneous QHS  . insulin aspart  0-9 Units Subcutaneous TID WC  . lamoTRIgine  150 mg Oral q morning - 10a  . levothyroxine  100 mcg Oral Q0600  . lurasidone  40 mg Oral QPM  . prednisoLONE acetate  1 drop Right Eye QID  . vitamin B-12  1,000 mcg Oral Daily    Consultants:    Procedures:    Antibiotics:   Anti-infectives (From admission, onward)   None       Objective   Vitals:   07/08/19 1458 07/08/19 2134 07/09/19 0611  07/09/19 0806  BP: 140/62 95/76 112/78   Pulse: 79 84 78   Resp: 12 20 20    Temp: 97.7 F (36.5 C) 98.6 F (37 C) 98 F (36.7 C)   TempSrc:  Oral Oral   SpO2: 92% 91% 92% 93%    Intake/Output Summary (Last 24 hours) at 07/09/2019 0952 Last data filed at 07/09/2019 [id number] Gross per 24 hour  Intake 340 ml  Output 600 ml  Net -260 ml    05/15 1901 - 05/17 0700 In: 440 [P.O.:440] Out: 600 [Urine:600]  There were no vitals filed for this visit.  Physical Examination:     General: Appears in no acute distress  Cardiovascular: S1-S2, regular, no murmur auscultated  Respiratory: Clear to auscultation bilaterally  Abdomen: Soft, nontender, no organomegaly  Extremities: No edema in the lower extremities, left knee is nontender to palpation no swelling noted  Neurologic: Alert, oriented x3, moving all extremities, no focal deficit, intact insight and judgment    Data Reviewed:   Recent Results (from the past 240 hour(s))  SARS Coronavirus 2 by RT PCR (hospital order, performed in [place] hospital lab) Nasopharyngeal Nasopharyngeal Swab     Status: None   Collection Time: 07/06/19  6:35 AM   Specimen: Nasopharyngeal Swab  Result Value Ref Range Status   SARS Coronavirus 2 NEGATIVE NEGATIVE Final    Comment: (NOTE) SARS-CoV-2 target nucleic acids are NOT DETECTED. The SARS-CoV-2 RNA is generally detectable in upper and lower respiratory specimens during the acute phase of infection. The lowest concentration of SARS-CoV-2 viral copies this assay can detect is 250 copies / mL. A negative result does not preclude SARS-CoV-2 infection and should not be used as the sole basis for treatment or other patient management decisions.  A negative result may occur with improper specimen collection / handling, submission of specimen other than nasopharyngeal swab, presence of viral mutation(s) within the areas targeted by this assay, and inadequate number of viral copies (<250  copies / mL). A negative result must be combined with clinical observations, patient history, and epidemiological information. Fact Sheet for Patients:   StrictlyIdeas.no Fact Sheet for Healthcare Providers: BankingDealers.co.za This test is not yet approved or cleared  by the [place] FDA and has been authorized for detection and/or diagnosis of SARS-CoV-2 by FDA under an Emergency Use Authorization (EUA).  This EUA will remain in effect (meaning this test can be used) for the duration of the COVID-19 declaration under Section 564(b)(1) of the Act,  21 U.S.C. section 360bbb-3(b)(1), unless the authorization is terminated or revoked sooner. Performed at [facility], [place] [address], [place], [place] [address]      Studies:  No results found.     Oswald Hillock   [facility] If 7PM-7AM, please contact night-coverage at [email or web address], Office  [phone]   07/09/2019, 9:52 AM  LOS: 2 days

## 2019-07-09 NOTE — TOC Initial Note (Addendum)
Transition of Care [facility]) - Initial/Assessment Note    Patient Details  Name: [name] MRN: [id number] Date of Birth: 25-May-1934  Transition of [facility]) CM/SW Contact:    Trish Mage, LCSW Phone Number: 07/09/2019, 9:21 AM  Clinical Narrative:   Met with patient in follow up to PT recommendation of SNF.   Son at bedside; given permission by patient to speak with him or his sister Anderson [place].  She lives with her son here in [place], has DME cane, rollator, walker, wheelchair. Ms Hoheisel has had her COVID vaccination.  Son states he and sister are currently unable to care for patient, and goal is for her to be able to do transfers after a stay in rehab.  Ms Hinkley agrees to this recommendation.  Explained the bed search process to them.  Bed search initiated.  TOC will continue to follow during the course of hospitalization.  AddendumMartin Majestic over list of bed offers with son, who wants to share it with sister.  States he will be able to give me an answer in the AM.                Expected Discharge Plan: Skilled Nursing Facility Barriers to Discharge: SNF Pending bed offer   Patient Goals and CMS Choice Patient states their goals for this hospitalization and ongoing recovery are:: "I think going to rehab is the best idea."   Choice offered to / list presented to : Patient, Adult Children  Expected Discharge Plan and Services Expected Discharge Plan: [place]   Discharge Planning Services: CM Consult Post Acute Care Choice: [place] Living arrangements for the past 2 months: Single Family Home                                      Prior Living Arrangements/Services Living arrangements for the past 2 months: Single Family Home Lives with:: Adult Children Patient language and need for interpreter reviewed:: Yes        Need for Family Participation in Patient Care: Yes (Comment) Care giver support system in place?: Yes  (comment) Current home services: DME Criminal Activity/Legal Involvement Pertinent to Current Situation/Hospitalization: No - Comment as needed  Activities of Daily Living Home Assistive Devices/Equipment: CBG Meter, Cane (specify quad or straight)(single point cane) ADL Screening (condition at time of admission) Patient's cognitive ability adequate to safely complete daily activities?: No Is the patient deaf or have difficulty hearing?: No Does the patient have difficulty seeing, even when wearing glasses/contacts?: No Does the patient have difficulty concentrating, remembering, or making decisions?: Yes Patient able to express need for assistance with ADLs?: Yes Does the patient have difficulty dressing or bathing?: Yes Independently performs ADLs?: No Communication: Independent Dressing (OT): Needs assistance Is this a change from baseline?: Pre-admission baseline Grooming: Independent Feeding: Independent Bathing: Needs assistance Is this a change from baseline?: Pre-admission baseline Toileting: Needs assistance Is this a change from baseline?: Change from baseline, expected to last >3days In/Out Bed: Needs assistance Is this a change from baseline?: Change from baseline, expected to last >3 days Walks in Home: Needs assistance Is this a change from baseline?: Change from baseline, expected to last >3 days Does the patient have difficulty walking or climbing stairs?: Yes(secondary to weakness) Weakness of Legs: Both Weakness of Arms/Hands: None  Permission Sought/Granted Permission sought to share information with : Family Supports Permission granted to  share information with : Yes, Verbal Permission Granted  Share Information with NAME: Andreal Vultaggio     Permission granted to share info w Relationship: son  Permission granted to share info w Contact Information: [id number] [phone]  Emotional Assessment Appearance:: Appears stated age Attitude/Demeanor/Rapport: Engaged Affect  (typically observed): Appropriate Orientation: : Oriented to Self Alcohol / Substance Use: Not Applicable Psych Involvement: No (comment)  Admission diagnosis:  Status post eye surgery [Z98.890] Generalized weakness [R53.1] Acute pain of left knee [M25.562] Weakness of both lower extremities [R29.898] Patient Active Problem List   Diagnosis Date Noted  . Generalized weakness 07/06/2019  . AKI (acute kidney injury) ([place]) 07/06/2019  . Dehydration 07/06/2019  . Accidental fall 07/06/2019  . Dementia without behavioral disturbance ([place]) 07/06/2019  . TIA (transient ischemic attack) 09/08/2012  . Cerebral infarction ([place]) 04/04/2012  . Cerebellar stroke, acute ([place]) 04/01/2012  . 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 Nausea and vomiting 04/01/2012  . Polymyalgia rheumatica ([place]) 04/01/2012  . Bifascicular block   . Hypertension   . Hyperlipidemia   . Obstructive sleep apnea   . Diabetes mellitus ([place])    PCP:  Shon Baton, MD Pharmacy:   [place], [place] - [address] [place] [place] [address] Phone: [phone] Fax: [phone]     Social Determinants of Health (SDOH) Interventions    Readmission Risk Interventions No flowsheet data found.

## 2019-07-10 DIAGNOSIS — Z9889 Other specified postprocedural states: Secondary | ICD-10-CM

## 2019-07-10 DIAGNOSIS — I10 Essential (primary) hypertension: Secondary | ICD-10-CM

## 2019-07-10 LAB — GLUCOSE, CAPILLARY
Glucose-Capillary: 129 mg/dL — ABNORMAL HIGH (ref 70–99)
Glucose-Capillary: 117 mg/dL — ABNORMAL HIGH (ref 70–99)
Glucose-Capillary: 123 mg/dL — ABNORMAL HIGH (ref 70–99)
Glucose-Capillary: 131 mg/dL — ABNORMAL HIGH (ref 70–99)

## 2019-07-10 MED ORDER — ACETAMINOPHEN 325 MG PO TABS: 650.0000 mg | ORAL_TABLET | Freq: Four times a day (QID) | ORAL | Status: DC | PRN

## 2019-07-10 MED ORDER — GATIFLOXACIN 0.5 % OP SOLN: 1.0000 [drp] | Freq: Four times a day (QID) | OPHTHALMIC | 0 refills | Status: AC

## 2019-07-10 MED ORDER — PREDNISOLONE ACETATE 1 % OP SUSP: 1.0000 [drp] | Freq: Four times a day (QID) | OPHTHALMIC | 0 refills | Status: DC

## 2019-07-10 NOTE — Progress Notes (Signed)
Occupational Therapy Treatment Patient Details Name: [name] MRN: [id number] DOB: 10-Jan-1935 Today's Date: 07/10/2019    History of present illness Pt admitted s/p fall at home and with hx of DM, CVA, Dementia, polymyalgia Rheumatica, L TKR and Eye surgery 07/05/19.  Imaging reports no fxs   OT comments  Patient showing improvement in mobility, able to tolerate transfer to recliner with max A x1. Patient does require increased time and 1 step cues due to decreased cognition. Son present this session and states patient is below baseline for cognition, is usually more lucid and was taking care of her basic ADLs herself prior to last Thursday. Spoke with son regarding his concerns and importance of getting patient to rehab to increase patient's therapy. Will continue to follow.   Follow Up Recommendations  SNF    Equipment Recommendations  Other (comment)(TBD)       Precautions / Restrictions Precautions Precautions: Fall Precaution Comments: Pt must sit up 2* eye surgery Restrictions Weight Bearing Restrictions: No       Mobility Bed Mobility Overal bed mobility: Needs Assistance Bed Mobility: Supine to Sit     Supine to sit: HOB elevated;Max assist     General bed mobility comments: pt requires 1 step commands with increased time, assist sliding L LE towards edge of bed and mod A for trunk support to sitting  Transfers Overall transfer level: Needs assistance Equipment used: Rolling walker (2 wheeled) Transfers: Sit to/from [facility] Sit to Stand: Max assist Stand pivot transfers: Max assist       General transfer comment: cues for hand placement, posterior lean, difficulty weight shifting LEs    Balance Overall balance assessment: Needs assistance Sitting-balance support: No upper extremity supported;Feet supported Sitting balance-Leahy Scale: Fair   Postural control: Posterior lean Standing balance support: Bilateral upper extremity  supported;During functional activity Standing balance-Leahy Scale: Zero Standing balance comment: B UE support and max A with posterior lean                           ADL either performed or assessed with clinical judgement   ADL Overall ADL's : Needs assistance/impaired     Grooming: Wash/dry face;Set up;Bed level                   Toilet Transfer: Maximal assistance;Cueing for safety;Cueing for sequencing;Stand-pivot;BSC;RW Toilet Transfer Details (indicate cue type and reason): cues for hand placement, significant posterior lean upon standing able to correct minimally, difficulty weight shifting requiring max A x1 to transfer to recliner         Functional mobility during ADLs: Maximal assistance;Cueing for sequencing;Cueing for safety;Rolling walker                 Cognition Arousal/Alertness: Awake/alert Behavior During Therapy: WFL for tasks assessed/performed Overall Cognitive Status: Impaired/Different from baseline Area of Impairment: Orientation;Following commands;Safety/judgement;Problem solving                 Orientation Level: Disoriented to;Time     Following Commands: Follows one step commands with increased time Safety/Judgement: Decreased awareness of safety   Problem Solving: Slow processing;Decreased initiation;Requires verbal cues;Requires tactile cues General Comments: son present this session, reports patient is far below her baseline in cognition and that patient was much more functional prior to thursday                    Pertinent Vitals/ Pain  Pain Assessment: Faces Faces Pain Scale: Hurts little more Pain Location: L knee Pain Descriptors / Indicators: Sore Pain Intervention(s): Monitored during session         Frequency  Min 2X/week        Progress Toward Goals  OT Goals(current goals can now be found in the care plan section)  Progress towards OT goals: Progressing toward goals  Acute  Rehab OT Goals Patient Stated Goal: Get better OT Goal Formulation: With patient Time For Goal Achievement: 07/20/19 Potential to Achieve Goals: Good ADL Goals Pt Will Perform Upper Body Bathing: with min assist;sitting Pt Will Transfer to Toilet: with mod assist;stand pivot transfer;bedside commode Pt Will Perform Toileting - Clothing Manipulation and hygiene: with mod assist;sit to/from stand;sitting/lateral leans Additional ADL Goal #1: Patient will require mod A x1 for bed mobility in preparation for self care tasks.  Plan Discharge plan remains appropriate       AM-PAC OT "6 Clicks" Daily Activity     Outcome Measure   Help from another person eating meals?: A Little Help from another person taking care of personal grooming?: A Little Help from another person toileting, which includes using toliet, bedpan, or urinal?: Total Help from another person bathing (including washing, rinsing, drying)?: A Lot Help from another person to put on and taking off regular upper body clothing?: A Little Help from another person to put on and taking off regular lower body clothing?: Total 6 Click Score: 13    End of Session Equipment Utilized During Treatment: Gait belt;Rolling walker  OT Visit Diagnosis: Other abnormalities of gait and mobility (R26.89);Muscle weakness (generalized) (M62.81);History of falling (Z91.81);Pain Pain - Right/Left: Left Pain - part of body: Knee   Activity Tolerance Patient tolerated treatment well   Patient Left in chair;with call bell/phone within reach;with chair alarm set   Nurse Communication Mobility status;Need for lift equipment        Time: [phone] OT Time Calculation (min): 40 min  Charges: OT General Charges $OT Visit: 1 Visit OT Treatments $Self Care/Home Management : 38-52 mins  Delbert Phenix OT Pager: [place] 07/10/2019, 9:27 AM

## 2019-07-10 NOTE — Care Management Important Message (Signed)
Important Message  Patient Details IM Letter given to Roque Lias SW Case Manager to present to the Patient Name: [name] MRN: [id number] Date of Birth: [birth year]   Medicare Important Message Given:  Yes     Kerin Salen 07/10/2019, 9:54 AM

## 2019-07-10 NOTE — Discharge Summary (Signed)
Physician Discharge Summary  [name] [id number] DOB: 11-11-1934 DOA: 07/06/2019  PCP: Shon Baton, MD  Admit date: 07/06/2019 Discharge date: 07/10/2019  Time spent: 50 minutes  Recommendations for Outpatient Follow-up:  1. Continue gatifloxacin eyedrops still 07/17/2019 2. Continue prednisolone acetate ophthalmic suspension daily till you see eye surgeon as outpatient 3. Keep your head propped up as much as possible, also sleep with head propped up position   Discharge Diagnoses:  Principal Problem:   Generalized weakness Active Problems:   Hypertension   Hyperlipidemia   Diabetes mellitus ([place])   Cerebral infarction ([place])   AKI (acute kidney injury) ([place])   Dehydration   Accidental fall   Dementia without behavioral disturbance ([place])   Discharge Condition: Stable  Diet recommendation: Heart healthy diet  There were no vitals filed for this visit.  History of present illness:  85 year old female with medical history of dementia, diabetes mellitus type 2, hypertension, hypothyroidism, hyperlipidemia, COPD presented to ED with complaints of generalized weakness and fall. Patient had right eye surgery after that when she went home. She was not able to get up from wheelchair and landed on her knees with subsequent left knee pain and difficulty bearing weight on left leg. Imaging studies in the ED was unremarkable. Patient still has significant swelling and pain in the left knee. X-ray of the knee was unremarkable.   Hospital Course:  * 1. Generalized weakness-patient had accidental fall after the eye surgery.  She landed on her left knee and felt pain.  PT/ OT was consulted.  Plan to go to skilled nursing facility for rehab.  CT head was negative for acute abnormality. 2. Left knee pain/swelling x-ray of left knee shows no acute abnormality.  Improved, continue cold compresses.  Avoid NSAIDs due to history of CKD stage III.  Continue Tylenol as  needed. 3. Hypoxia-resolved, patient had 1 episode of O2 sats 87% on room air.  Chest x-ray obtained  showed no active disease.  Patient is currently not requiring oxygen.  4. Hypertension-continue Avapro 5. Hyperlipidemia-continue statin 6. Diabetes mellitus type 2-continue sliding scale insulin with NovoLog. 7. History of CVA-continue Plavix and statin. 8. Acute kidney injury-resolved, patient presented with creatinine of 1.46, now improved to 0.98 with IV hydration.  IV fluids have been discontinued. 9. Recent eye surgery-patient will continue take gatifloxacin ophthalmic eyedrops till 07/17/2019, continue prednisolone forte eyedrops till she sees ophthalmologist as outpatient.  This was discussed with Dr. Sherlynn Stalls patient's ophthalmologist.  Procedures:    Consultations:    Discharge Exam: Vitals:   07/10/19 0455 07/10/19 0815  BP: (!) 167/76   Pulse: 86   Resp: 18   Temp: 97.7 F (36.5 C)   SpO2: 91% 92%    General: Appears in no acute distress Cardiovascular: S1-S2, regular Respiratory: Clear to auscultation bilaterally  Discharge Instructions   Discharge Instructions    Diet - low sodium heart healthy   Complete by: As directed    Discharge instructions   Complete by: As directed    Keep head propped up while sleeping   Increase activity slowly   Complete by: As directed      Allergies as of 07/10/2019      Reactions   Adhesive [tape] Other (See Comments)   Irritation and pulls off skin      Medication List    STOP taking these medications   atorvastatin 40 MG tablet Commonly known as: LIPITOR   irbesartan 75 MG tablet Commonly known as: AVAPRO  TAKE these medications   acetaminophen 325 MG tablet Commonly known as: TYLENOL Take 2 tablets (650 mg total) by mouth every 6 (six) hours as needed for mild pain or moderate pain (or Fever >/= 101). What changed:   medication strength  how much to take  when to take this  reasons to take  this  additional instructions   Breo Ellipta 200-25 MCG/INH Aepb Generic drug: fluticasone furoate-vilanterol Inhale 1 puff into the lungs daily.   cholecalciferol 25 MCG (1000 UNIT) tablet Commonly known as: VITAMIN D3 Take 1,000 Units by mouth daily.   clopidogrel 75 MG tablet Commonly known as: PLAVIX Take 1 tablet (75 mg total) by mouth daily with breakfast.   donepezil 10 MG tablet Commonly known as: ARICEPT Take 10 mg by mouth at bedtime.   DULoxetine 60 MG capsule Commonly known as: CYMBALTA Take 60 mg by mouth every morning.   folic acid 1 MG tablet Commonly known as: FOLVITE Take 1 tablet (1 mg total) by mouth 2 (two) times daily.   gatifloxacin 0.5 % Soln Commonly known as: ZYMAXID Place 1 drop into the right eye 4 (four) times daily for 7 days.   glimepiride 2 MG tablet Commonly known as: AMARYL Take 2 mg by mouth daily with breakfast.   lamoTRIgine 150 MG tablet Commonly known as: LAMICTAL Take 150 mg by mouth every morning.   Latuda 40 MG Tabs tablet Generic drug: lurasidone Take 40 mg by mouth every evening.   levothyroxine 100 MCG tablet Commonly known as: SYNTHROID Take 1 tablet (100 mcg total) by mouth daily.   losartan 50 MG tablet Commonly known as: COZAAR Take 50 mg by mouth daily.   prednisoLONE acetate 1 % ophthalmic suspension Commonly known as: PRED FORTE Place 1 drop into the right eye 4 (four) times daily.   Ventolin HFA 108 (90 Base) MCG/ACT inhaler Generic drug: albuterol Inhale 2 puffs into the lungs every 4 (four) hours as needed for wheezing or shortness of breath.   vitamin B-12 1000 MCG tablet Commonly known as: CYANOCOBALAMIN Take 1 tablet (1,000 mcg total) by mouth daily. What changed:   how much to take  when to take this      Allergies  Allergen Reactions  . Adhesive [Tape] Other (See Comments)    Irritation and pulls off skin   Contact information for after-discharge care    Destination    [facility] Preferred SNF .   Service: Skilled Nursing Contact information: [place] [phone]               The results of significant diagnostics from this hospitalization (including imaging, microbiology, ancillary and laboratory) are listed below for reference.    Significant Diagnostic Studies: CT PELVIS WO CONTRAST  Result Date: 07/06/2019 CLINICAL DATA:  Left hip pain after fall. EXAM: CT PELVIS WITHOUT CONTRAST TECHNIQUE: Multidetector CT imaging of the pelvis was performed following the standard protocol without intravenous contrast. COMPARISON:  None. FINDINGS: Urinary Tract:  Collapsed urinary bladder. Bowel:  No evidence of inflammation or obstruction. Vascular/Lymphatic: Diffuse atherosclerotic plaque. Reproductive:  Small calcified fibroid. Other:  Negative for ascites. Musculoskeletal: No visible fracture.  No dislocation. Lower lumbar disc narrowing and facet arthropathy. Sacral stimulator on the right. IMPRESSION: No malalignment or visible fracture. Electronically Signed   By: Monte Fantasia M.D.   On: 07/06/2019 04:21   DG Chest Port 1V same Day  Result Date: 07/07/2019 CLINICAL DATA:  Hypoxia, dementia,  diabetes. EXAM: PORTABLE CHEST 1 VIEW COMPARISON:  Chest x-ray dated 04/28/2010. FINDINGS: Heart size and mediastinal contours are within normal limits. Lungs are clear. No pleural effusion or pneumothorax is seen. Osseous structures about the chest are unremarkable. IMPRESSION: No active disease. No evidence of pneumonia or pulmonary edema. Electronically Signed   By: Franki Cabot M.D.   On: 07/07/2019 09:19   DG Knee Complete 4 Views Left  Result Date: 07/06/2019 CLINICAL DATA:  Fall pain to the knee EXAM: LEFT KNEE - COMPLETE 4+ VIEW COMPARISON:  None. FINDINGS: The patient is status post left total knee arthroplasty. No periprosthetic lucency or fracture is identified. There is a small knee joint effusion.  There is diffuse osteopenia. Scattered vascular calcifications are noted. IMPRESSION: Status post left total knee arthroplasty without complication. Electronically Signed   By: Prudencio Pair M.D.   On: 07/06/2019 01:49   DG Hip Unilat W or Wo Pelvis 2-3 Views Left  Result Date: 07/06/2019 CLINICAL DATA:  Fall and pain EXAM: DG HIP (WITH OR WITHOUT PELVIS) 2-3V LEFT COMPARISON:  None. FINDINGS: Limited examination due to technique. No definite fracture seen. There is moderate bilateral hip osteoarthritis with superior joint space loss. Diffuse osteopenia is noted. IMPRESSION: No definite fracture. If there is high clinical suspicion for occult hip fracture or the patient refuses to weightbear, consider further evaluation with cross-sectional imaging. Electronically Signed   By: Prudencio Pair M.D.   On: 07/06/2019 01:50    Microbiology: Recent Results (from the past 240 hour(s))  SARS Coronavirus 2 by RT PCR (hospital order, performed in [facility] hospital lab) Nasopharyngeal Nasopharyngeal Swab     Status: None   Collection Time: 07/06/19  6:35 AM   Specimen: Nasopharyngeal Swab  Result Value Ref Range Status   SARS Coronavirus 2 NEGATIVE NEGATIVE Final    Comment: (NOTE) SARS-CoV-2 target nucleic acids are NOT DETECTED. The SARS-CoV-2 RNA is generally detectable in upper and lower respiratory specimens during the acute phase of infection. The lowest concentration of SARS-CoV-2 viral copies this assay can detect is 250 copies / mL. A negative result does not preclude SARS-CoV-2 infection and should not be used as the sole basis for treatment or other patient management decisions.  A negative result may occur with improper specimen collection / handling, submission of specimen other than nasopharyngeal swab, presence of viral mutation(s) within the areas targeted by this assay, and inadequate number of viral copies (<250 copies / mL). A negative result must be combined with  clinical observations, patient history, and epidemiological information. Fact Sheet for Patients:   StrictlyIdeas.no Fact Sheet for Healthcare Providers: BankingDealers.co.za This test is not yet approved or cleared  by the [place] FDA and has been authorized for detection and/or diagnosis of SARS-CoV-2 by FDA under an Emergency Use Authorization (EUA).  This EUA will remain in effect (meaning this test can be used) for the duration of the COVID-19 declaration under Section 564(b)(1) of the Act, 21 U.S.C. section 360bbb-3(b)(1), unless the authorization is terminated or revoked sooner. Performed at [facility], [place] [address], [place], [place] [address]      Labs: Basic Metabolic Panel: Recent Labs  Lab 07/06/19 0440 07/07/19 0607  NA 137 137  K 4.5 4.0  CL 102 103  CO2 24 23  GLUCOSE 134* 127*  BUN 28* 19  CREATININE 1.46* 0.98  CALCIUM 9.4 9.5  MG  --  1.8  PHOS  --  2.5   Liver Function Tests: Recent  Labs  Lab 07/07/19 0607  AST 16  ALT 14  ALKPHOS 47  BILITOT 1.0  PROT 7.9  ALBUMIN 4.3   No results for input(s): LIPASE, AMYLASE in the last 168 hours. No results for input(s): AMMONIA in the last 168 hours. CBC: Recent Labs  Lab 07/06/19 0440 07/07/19 0607  WBC 10.8* 8.9  NEUTROABS 7.4  --   HGB 13.0 12.9  HCT 41.4 41.1  MCV 100.0 99.0  PLT 246 234    CBG: Recent Labs  Lab 07/09/19 0751 07/09/19 1138 07/09/19 1623 07/09/19 2141 07/10/19 0829  GLUCAP 129* 110* 127* 202* 129*       Signed:  Oswald Hillock MD.  [facility] 07/10/2019, 10:21 AM

## 2019-07-11 DIAGNOSIS — E860 Dehydration: Secondary | ICD-10-CM

## 2019-07-11 DIAGNOSIS — I639 Cerebral infarction, unspecified: Secondary | ICD-10-CM

## 2019-07-11 LAB — GLUCOSE, CAPILLARY
Glucose-Capillary: 124 mg/dL — ABNORMAL HIGH (ref 70–99)
Glucose-Capillary: 151 mg/dL — ABNORMAL HIGH (ref 70–99)
Glucose-Capillary: 126 mg/dL — ABNORMAL HIGH (ref 70–99)
Glucose-Capillary: 166 mg/dL — ABNORMAL HIGH (ref 70–99)

## 2019-07-11 NOTE — TOC Progression Note (Signed)
Transition of Care [facility]) - Progression Note    Patient Details  Name: [name] MRN: [id number] Date of Birth: Dec 08, 1934  Transition of Care [facility]) CM/SW [place], [place] Phone Number: 07/11/2019, 10:26 AM  Clinical Narrative:   While son is happy with his mother's progress and feels like she is ready to transfer today, he is also unwilling to have her transferred until they find out the results of the appeal of d/c as he is confident the appeal will be upheld.  TOC will continue to follow during the course of hospitalization.     Expected Discharge Plan: [place] Barriers to Discharge: SNF Pending bed offer  Expected Discharge Plan and Services Expected Discharge Plan: [place]   Discharge Planning Services: CM Consult Post Acute Care Choice: [place] Living arrangements for the past 2 months: Single Family Home Expected Discharge Date: 07/10/19                                     Social Determinants of Health (SDOH) Interventions    Readmission Risk Interventions No flowsheet data found.

## 2019-07-11 NOTE — Discharge Summary (Addendum)
Physician Discharge Summary  [name] [id number] DOB: 10-10-1934 DOA: 07/06/2019  PCP: Shon Baton, MD  Admit date: 07/06/2019 Discharge date: 07/11/2019  Time spent: 50 minutes  Recommendations for Outpatient Follow-up:  1. Continue gatifloxacin eyedrops still 07/17/2019 2. Continue prednisolone acetate ophthalmic suspension daily till you see eye surgeon as outpatient 3. Keep your head propped up as much as possible, also sleep with head propped up position   Discharge Diagnoses:  Principal Problem:   Generalized weakness Active Problems:   Hypertension   Hyperlipidemia   Diabetes mellitus ([place])   Cerebral infarction ([place])   AKI (acute kidney injury) ([place])   Dehydration   Accidental fall   Dementia without behavioral disturbance ([place])   Discharge Condition: Stable  Diet recommendation: Heart healthy diet  There were no vitals filed for this visit.  History of present illness:  85 year old female with medical history of dementia, diabetes mellitus type 2, hypertension, hypothyroidism, hyperlipidemia, COPD presented to ED with complaints of generalized weakness and fall. Patient had right eye surgery after that when she went home. She was not able to get up from wheelchair and landed on her knees with subsequent left knee pain and difficulty bearing weight on left leg. Imaging studies in the ED was unremarkable. Patient still has significant swelling and pain in the left knee. X-ray of the knee was unremarkable.  **Interim History ADDENDUM 07/11/19: She was not discharged yesterday due to family appeal of D/C. She is stable to D/C and family agreeable to take her to SNF today. She is medically stable to D/C today with no acute changes overnight.   Hospital Course:  * 1. Generalized weakness-patient had accidental fall after the eye surgery.  She landed on her left knee and felt pain.  PT/ OT was consulted.  Plan to go to skilled nursing facility for rehab.  CT  head was negative for acute abnormality. 2. Left knee pain/swelling x-ray of left knee shows no acute abnormality.  Improved, continue cold compresses.  Avoid NSAIDs due to history of CKD stage III.  Continue Tylenol as needed. 3. Hypoxia-resolved, patient had 1 episode of O2 sats 87% on room air.  Chest x-ray obtained  showed no active disease.  Patient is currently not requiring oxygen.  4. Hypertension-continue Avapro 5. Hyperlipidemia-continue statin 6. Diabetes mellitus type 2-continue sliding scale insulin with NovoLog. 7. History of CVA-continue Plavix and statin. 8. Acute kidney injury-resolved, patient presented with creatinine of 1.46, now improved to 0.98 with IV hydration.  IV fluids have been discontinued. 9. Recent eye surgery-patient will continue take gatifloxacin ophthalmic eyedrops till 07/17/2019, continue prednisolone forte eyedrops till she sees ophthalmologist as outpatient.  This was discussed with Dr. Sherlynn Stalls patient's ophthalmologist.  Procedures:    Consultations:    Discharge Exam: Vitals:   07/11/19 0639 07/11/19 0739  BP: (!) 163/89   Pulse: 78   Resp: 18   Temp: 98.1 F (36.7 C)   SpO2: 90% 93%    General: Appears in no acute distress Cardiovascular: S1-S2, regular Respiratory: Clear to auscultation bilaterally  Discharge Instructions   Discharge Instructions    Diet - low sodium heart healthy   Complete by: As directed    Discharge instructions   Complete by: As directed    Keep head propped up while sleeping   Increase activity slowly   Complete by: As directed      Allergies as of 07/11/2019      Reactions   Adhesive [tape] Other (See Comments)  Irritation and pulls off skin      Medication List    STOP taking these medications   atorvastatin 40 MG tablet Commonly known as: LIPITOR   irbesartan 75 MG tablet Commonly known as: AVAPRO     TAKE these medications   acetaminophen 325 MG tablet Commonly known as:  TYLENOL Take 2 tablets (650 mg total) by mouth every 6 (six) hours as needed for mild pain or moderate pain (or Fever >/= 101). What changed:   medication strength  how much to take  when to take this  reasons to take this  additional instructions   Breo Ellipta 200-25 MCG/INH Aepb Generic drug: fluticasone furoate-vilanterol Inhale 1 puff into the lungs daily.   cholecalciferol 25 MCG (1000 UNIT) tablet Commonly known as: VITAMIN D3 Take 1,000 Units by mouth daily.   clopidogrel 75 MG tablet Commonly known as: PLAVIX Take 1 tablet (75 mg total) by mouth daily with breakfast.   donepezil 10 MG tablet Commonly known as: ARICEPT Take 10 mg by mouth at bedtime.   DULoxetine 60 MG capsule Commonly known as: CYMBALTA Take 60 mg by mouth every morning.   folic acid 1 MG tablet Commonly known as: FOLVITE Take 1 tablet (1 mg total) by mouth 2 (two) times daily.   gatifloxacin 0.5 % Soln Commonly known as: ZYMAXID Place 1 drop into the right eye 4 (four) times daily for 7 days.   glimepiride 2 MG tablet Commonly known as: AMARYL Take 2 mg by mouth daily with breakfast.   lamoTRIgine 150 MG tablet Commonly known as: LAMICTAL Take 150 mg by mouth every morning.   Latuda 40 MG Tabs tablet Generic drug: lurasidone Take 40 mg by mouth every evening.   levothyroxine 100 MCG tablet Commonly known as: SYNTHROID Take 1 tablet (100 mcg total) by mouth daily.   losartan 50 MG tablet Commonly known as: COZAAR Take 50 mg by mouth daily.   prednisoLONE acetate 1 % ophthalmic suspension Commonly known as: PRED FORTE Place 1 drop into the right eye 4 (four) times daily.   Ventolin HFA 108 (90 Base) MCG/ACT inhaler Generic drug: albuterol Inhale 2 puffs into the lungs every 4 (four) hours as needed for wheezing or shortness of breath.   vitamin B-12 1000 MCG tablet Commonly known as: CYANOCOBALAMIN Take 1 tablet (1,000 mcg total) by mouth daily. What changed:   how  much to take  when to take this      Allergies  Allergen Reactions  . Adhesive [Tape] Other (See Comments)    Irritation and pulls off skin   Contact information for after-discharge care    Destination    [facility] Preferred SNF .   Service: Skilled Nursing Contact information: [place] [phone]               The results of significant diagnostics from this hospitalization (including imaging, microbiology, ancillary and laboratory) are listed below for reference.    Significant Diagnostic Studies: CT PELVIS WO CONTRAST  Result Date: 07/06/2019 CLINICAL DATA:  Left hip pain after fall. EXAM: CT PELVIS WITHOUT CONTRAST TECHNIQUE: Multidetector CT imaging of the pelvis was performed following the standard protocol without intravenous contrast. COMPARISON:  None. FINDINGS: Urinary Tract:  Collapsed urinary bladder. Bowel:  No evidence of inflammation or obstruction. Vascular/Lymphatic: Diffuse atherosclerotic plaque. Reproductive:  Small calcified fibroid. Other:  Negative for ascites. Musculoskeletal: No visible fracture.  No dislocation. Lower lumbar disc narrowing and facet  arthropathy. Sacral stimulator on the right. IMPRESSION: No malalignment or visible fracture. Electronically Signed   By: Monte Fantasia M.D.   On: 07/06/2019 04:21   DG Chest Port 1V same Day  Result Date: 07/07/2019 CLINICAL DATA:  Hypoxia, dementia, diabetes. EXAM: PORTABLE CHEST 1 VIEW COMPARISON:  Chest x-ray dated 04/28/2010. FINDINGS: Heart size and mediastinal contours are within normal limits. Lungs are clear. No pleural effusion or pneumothorax is seen. Osseous structures about the chest are unremarkable. IMPRESSION: No active disease. No evidence of pneumonia or pulmonary edema. Electronically Signed   By: Franki Cabot M.D.   On: 07/07/2019 09:19   DG Knee Complete 4 Views Left  Result Date: 07/06/2019 CLINICAL DATA:  Fall  pain to the knee EXAM: LEFT KNEE - COMPLETE 4+ VIEW COMPARISON:  None. FINDINGS: The patient is status post left total knee arthroplasty. No periprosthetic lucency or fracture is identified. There is a small knee joint effusion. There is diffuse osteopenia. Scattered vascular calcifications are noted. IMPRESSION: Status post left total knee arthroplasty without complication. Electronically Signed   By: Prudencio Pair M.D.   On: 07/06/2019 01:49   DG Hip Unilat W or Wo Pelvis 2-3 Views Left  Result Date: 07/06/2019 CLINICAL DATA:  Fall and pain EXAM: DG HIP (WITH OR WITHOUT PELVIS) 2-3V LEFT COMPARISON:  None. FINDINGS: Limited examination due to technique. No definite fracture seen. There is moderate bilateral hip osteoarthritis with superior joint space loss. Diffuse osteopenia is noted. IMPRESSION: No definite fracture. If there is high clinical suspicion for occult hip fracture or the patient refuses to weightbear, consider further evaluation with cross-sectional imaging. Electronically Signed   By: Prudencio Pair M.D.   On: 07/06/2019 01:50    Microbiology: Recent Results (from the past 240 hour(s))  SARS Coronavirus 2 by RT PCR (hospital order, performed in [facility] hospital lab) Nasopharyngeal Nasopharyngeal Swab     Status: None   Collection Time: 07/06/19  6:35 AM   Specimen: Nasopharyngeal Swab  Result Value Ref Range Status   SARS Coronavirus 2 NEGATIVE NEGATIVE Final    Comment: (NOTE) SARS-CoV-2 target nucleic acids are NOT DETECTED. The SARS-CoV-2 RNA is generally detectable in upper and lower respiratory specimens during the acute phase of infection. The lowest concentration of SARS-CoV-2 viral copies this assay can detect is 250 copies / mL. A negative result does not preclude SARS-CoV-2 infection and should not be used as the sole basis for treatment or other patient management decisions.  A negative result may occur with improper specimen collection / handling, submission of  specimen other than nasopharyngeal swab, presence of viral mutation(s) within the areas targeted by this assay, and inadequate number of viral copies (<250 copies / mL). A negative result must be combined with clinical observations, patient history, and epidemiological information. Fact Sheet for Patients:   StrictlyIdeas.no Fact Sheet for Healthcare Providers: BankingDealers.co.za This test is not yet approved or cleared  by the [place] FDA and has been authorized for detection and/or diagnosis of SARS-CoV-2 by FDA under an Emergency Use Authorization (EUA).  This EUA will remain in effect (meaning this test can be used) for the duration of the COVID-19 declaration under Section 564(b)(1) of the Act, 21 U.S.C. section 360bbb-3(b)(1), unless the authorization is terminated or revoked sooner. Performed at [facility], [place] [address], [place], [place] [address]      Labs: Basic Metabolic Panel: Recent Labs  Lab 07/06/19 0440 07/07/19 0607  NA 137 137  K 4.5  4.0  CL 102 103  CO2 24 23  GLUCOSE 134* 127*  BUN 28* 19  CREATININE 1.46* 0.98  CALCIUM 9.4 9.5  MG  --  1.8  PHOS  --  2.5   Liver Function Tests: Recent Labs  Lab 07/07/19 0607  AST 16  ALT 14  ALKPHOS 47  BILITOT 1.0  PROT 7.9  ALBUMIN 4.3   No results for input(s): LIPASE, AMYLASE in the last 168 hours. No results for input(s): AMMONIA in the last 168 hours. CBC: Recent Labs  Lab 07/06/19 0440 07/07/19 0607  WBC 10.8* 8.9  NEUTROABS 7.4  --   HGB 13.0 12.9  HCT 41.4 41.1  MCV 100.0 99.0  PLT 246 234    CBG: Recent Labs  Lab 07/10/19 0829 07/10/19 1202 07/10/19 1652 07/10/19 2040 07/11/19 0739  GLUCAP 129* 117* 123* 131* 124*   Signed:  Raiford Noble, DO  [facility] 07/11/2019, 9:38 AM

## 2019-07-12 DIAGNOSIS — S8002XA Contusion of left knee, initial encounter: Secondary | ICD-10-CM | POA: Diagnosis not present

## 2019-07-12 DIAGNOSIS — H3561 Retinal hemorrhage, right eye: Secondary | ICD-10-CM | POA: Diagnosis not present

## 2019-07-12 DIAGNOSIS — R531 Weakness: Secondary | ICD-10-CM | POA: Diagnosis not present

## 2019-07-12 DIAGNOSIS — R0602 Shortness of breath: Secondary | ICD-10-CM | POA: Diagnosis not present

## 2019-07-12 DIAGNOSIS — W19XXXD Unspecified fall, subsequent encounter: Secondary | ICD-10-CM | POA: Diagnosis not present

## 2019-07-12 DIAGNOSIS — E039 Hypothyroidism, unspecified: Secondary | ICD-10-CM | POA: Diagnosis not present

## 2019-07-12 DIAGNOSIS — F0390 Unspecified dementia without behavioral disturbance: Secondary | ICD-10-CM | POA: Diagnosis not present

## 2019-07-12 DIAGNOSIS — E119 Type 2 diabetes mellitus without complications: Secondary | ICD-10-CM | POA: Diagnosis not present

## 2019-07-12 DIAGNOSIS — M25562 Pain in left knee: Secondary | ICD-10-CM | POA: Diagnosis not present

## 2019-07-12 DIAGNOSIS — Z8673 Personal history of transient ischemic attack (TIA), and cerebral infarction without residual deficits: Secondary | ICD-10-CM | POA: Diagnosis not present

## 2019-07-12 DIAGNOSIS — R52 Pain, unspecified: Secondary | ICD-10-CM | POA: Diagnosis not present

## 2019-07-12 DIAGNOSIS — I10 Essential (primary) hypertension: Secondary | ICD-10-CM | POA: Diagnosis not present

## 2019-07-12 DIAGNOSIS — J449 Chronic obstructive pulmonary disease, unspecified: Secondary | ICD-10-CM | POA: Diagnosis not present

## 2019-07-12 DIAGNOSIS — R4182 Altered mental status, unspecified: Secondary | ICD-10-CM | POA: Diagnosis not present

## 2019-07-12 DIAGNOSIS — Z7401 Bed confinement status: Secondary | ICD-10-CM | POA: Diagnosis not present

## 2019-07-12 DIAGNOSIS — H3509 Other intraretinal microvascular abnormalities: Secondary | ICD-10-CM | POA: Diagnosis not present

## 2019-07-12 DIAGNOSIS — R410 Disorientation, unspecified: Secondary | ICD-10-CM | POA: Diagnosis not present

## 2019-07-12 DIAGNOSIS — I639 Cerebral infarction, unspecified: Secondary | ICD-10-CM | POA: Diagnosis not present

## 2019-07-12 DIAGNOSIS — N179 Acute kidney failure, unspecified: Secondary | ICD-10-CM | POA: Diagnosis not present

## 2019-07-12 DIAGNOSIS — W19XXXA Unspecified fall, initial encounter: Secondary | ICD-10-CM | POA: Diagnosis not present

## 2019-07-12 DIAGNOSIS — E860 Dehydration: Secondary | ICD-10-CM | POA: Diagnosis not present

## 2019-07-12 DIAGNOSIS — M2550 Pain in unspecified joint: Secondary | ICD-10-CM | POA: Diagnosis not present

## 2019-07-12 DIAGNOSIS — E785 Hyperlipidemia, unspecified: Secondary | ICD-10-CM | POA: Diagnosis not present

## 2019-07-12 LAB — GLUCOSE, CAPILLARY
Glucose-Capillary: 130 mg/dL — ABNORMAL HIGH (ref 70–99)
Glucose-Capillary: 185 mg/dL — ABNORMAL HIGH (ref 70–99)
Glucose-Capillary: 96 mg/dL (ref 70–99)

## 2019-07-12 NOTE — TOC Progression Note (Signed)
Transition of Care [facility]) - Progression Note    Patient Details  Name: [name] MRN: [id number] Date of Birth: 1934/05/30  Transition of Care [facility]) CM/SW Contact  Servando Snare, [place] Phone Number: 07/12/2019, 10:14 AM  Clinical Narrative:   LCSW returned call from [facility] and updated facility choice. Awaiting appeal decision.     Expected Discharge Plan: [place] Barriers to Discharge: SNF Pending bed offer  Expected Discharge Plan and Services Expected Discharge Plan: [place]   Discharge Planning Services: CM Consult Post Acute Care Choice: [place] Living arrangements for the past 2 months: Single Family Home Expected Discharge Date: 07/10/19                                     Social Determinants of Health (SDOH) Interventions    Readmission Risk Interventions No flowsheet data found.

## 2019-07-12 NOTE — TOC Transition Note (Signed)
Transition of Care [facility]) - CM/SW Discharge Note   Patient Details  Name: [name] MRN: [id number] Date of Birth: [birth year]  Transition of Care [facility]) CM/SW Contact:  Servando Snare, LCSW Phone Number: 07/12/2019, 2:25 PM   Clinical Narrative:   Patient accept bed at [facility]. Patient will report to room# 65 RN report # [phone]    Final next level of care: Skilled Nursing Facility Barriers to Discharge: No Barriers Identified   Patient Goals and CMS Choice Patient states their goals for this hospitalization and ongoing recovery are:: "I think going to rehab is the best idea." CMS Medicare.gov Compare Post Acute Care list provided to:: Patient Choice offered to / list presented to : Adult Children  Discharge Placement              Patient chooses bed at: [facility] [place] Patient to be transferred to facility by: EMS Name of family member notified: Arvis Gervasi Patient and family notified of of transfer: 07/12/19  Discharge Plan and Services   Discharge Planning Services: CM Consult Post Acute Care Choice: [place]          DME Arranged: N/A DME Agency: NA       HH Arranged: NA HH Agency: NA        Social Determinants of Health (SDOH) Interventions     Readmission Risk Interventions No flowsheet data found.

## 2019-07-12 NOTE — Progress Notes (Signed)
Report called to [facility] at this time and given to [facility]

## 2019-07-12 NOTE — Discharge Summary (Signed)
Physician Discharge Summary  [name] [id number] DOB: Oct 20, 1934 DOA: 07/06/2019  PCP: Shon Baton, MD  Admit date: 07/06/2019 Discharge date: 07/12/2019  Time spent: 50 minutes  Recommendations for Outpatient Follow-up:  1. Continue gatifloxacin eyedrops still 07/17/2019 2. Continue prednisolone acetate ophthalmic suspension daily till you see eye surgeon as outpatient 3. Keep your head propped up as much as possible, also sleep with head propped up position   Discharge Diagnoses:  Principal Problem:   Generalized weakness Active Problems:   Hypertension   Hyperlipidemia   Diabetes mellitus ([place])   Cerebral infarction ([place])   AKI (acute kidney injury) ([place])   Dehydration   Accidental fall   Dementia without behavioral disturbance ([place])   Discharge Condition: Stable  Diet recommendation: Heart healthy diet  There were no vitals filed for this visit.  History of present illness:  85 year old female with medical history of dementia, diabetes mellitus type 2, hypertension, hypothyroidism, hyperlipidemia, COPD presented to ED with complaints of generalized weakness and fall. Patient had right eye surgery after that when she went home. She was not able to get up from wheelchair and landed on her knees with subsequent left knee pain and difficulty bearing weight on left leg. Imaging studies in the ED was unremarkable. Patient still has significant swelling and pain in the left knee. X-ray of the knee was unremarkable.  **Interim History ADDENDUM 07/11/19: She was not discharged yesterday due to family appeal of D/C. She is stable to D/C and family agreeable to take her to SNF today. She is medically stable to D/C today with no acute changes overnight.  ADDENDUM 07/12/19: Still awaiting the decision appeal but she is Medically Stable to be discharged still. No acute changes overnight. Once decision of the appeal is made she can be be discharged.   Hospital Course:   * 1. Generalized weakness-patient had accidental fall after the eye surgery.  She landed on her left knee and felt pain.  PT/ OT was consulted.  Plan to go to skilled nursing facility for rehab.  CT head was negative for acute abnormality. 2. Left knee pain/swelling x-ray of left knee shows no acute abnormality.  Improved, continue cold compresses.  Avoid NSAIDs due to history of CKD stage III.  Continue Tylenol as needed. 3. Hypoxia-resolved, patient had 1 episode of O2 sats 87% on room air.  Chest x-ray obtained  showed no active disease.  Patient is currently not requiring oxygen.  4. Hypertension-continue Avapro 5. Hyperlipidemia-continue statin 6. Diabetes mellitus type 2-continue sliding scale insulin with NovoLog. 7. History of CVA-continue Plavix and statin. 8. Acute kidney injury-resolved, patient presented with creatinine of 1.46, now improved to 0.98 with IV hydration.  IV fluids have been discontinued. 9. Recent eye surgery-patient will continue take gatifloxacin ophthalmic eyedrops till 07/17/2019, continue prednisolone forte eyedrops till she sees ophthalmologist as outpatient.  This was discussed with Dr. Sherlynn Stalls patient's ophthalmologist.  Procedures:    Consultations:    Discharge Exam: Vitals:   07/11/19 2259 07/12/19 0550  BP: (!) 154/76 (!) 123/49  Pulse: 73 75  Resp:  16  Temp: 98.5 F (36.9 C) 97.8 F (36.6 C)  SpO2: 93% 93%    General: Appears in no acute distress Cardiovascular: S1-S2, regular Respiratory: Clear to auscultation bilaterally  Discharge Instructions   Discharge Instructions    Diet - low sodium heart healthy   Complete by: As directed    Discharge instructions   Complete by: As directed    Keep head  propped up while sleeping   Increase activity slowly   Complete by: As directed      Allergies as of 07/12/2019      Reactions   Adhesive [tape] Other (See Comments)   Irritation and pulls off skin      Medication List     STOP taking these medications   atorvastatin 40 MG tablet Commonly known as: LIPITOR   irbesartan 75 MG tablet Commonly known as: AVAPRO     TAKE these medications   acetaminophen 325 MG tablet Commonly known as: TYLENOL Take 2 tablets (650 mg total) by mouth every 6 (six) hours as needed for mild pain or moderate pain (or Fever >/= 101). What changed:   medication strength  how much to take  when to take this  reasons to take this  additional instructions   Breo Ellipta 200-25 MCG/INH Aepb Generic drug: fluticasone furoate-vilanterol Inhale 1 puff into the lungs daily.   cholecalciferol 25 MCG (1000 UNIT) tablet Commonly known as: VITAMIN D3 Take 1,000 Units by mouth daily.   clopidogrel 75 MG tablet Commonly known as: PLAVIX Take 1 tablet (75 mg total) by mouth daily with breakfast.   donepezil 10 MG tablet Commonly known as: ARICEPT Take 10 mg by mouth at bedtime.   DULoxetine 60 MG capsule Commonly known as: CYMBALTA Take 60 mg by mouth every morning.   folic acid 1 MG tablet Commonly known as: FOLVITE Take 1 tablet (1 mg total) by mouth 2 (two) times daily.   gatifloxacin 0.5 % Soln Commonly known as: ZYMAXID Place 1 drop into the right eye 4 (four) times daily for 7 days.   glimepiride 2 MG tablet Commonly known as: AMARYL Take 2 mg by mouth daily with breakfast.   lamoTRIgine 150 MG tablet Commonly known as: LAMICTAL Take 150 mg by mouth every morning.   Latuda 40 MG Tabs tablet Generic drug: lurasidone Take 40 mg by mouth every evening.   levothyroxine 100 MCG tablet Commonly known as: SYNTHROID Take 1 tablet (100 mcg total) by mouth daily.   losartan 50 MG tablet Commonly known as: COZAAR Take 50 mg by mouth daily.   prednisoLONE acetate 1 % ophthalmic suspension Commonly known as: PRED FORTE Place 1 drop into the right eye 4 (four) times daily.   Ventolin HFA 108 (90 Base) MCG/ACT inhaler Generic drug: albuterol Inhale 2 puffs  into the lungs every 4 (four) hours as needed for wheezing or shortness of breath.   vitamin B-12 1000 MCG tablet Commonly known as: CYANOCOBALAMIN Take 1 tablet (1,000 mcg total) by mouth daily. What changed:   how much to take  when to take this      Allergies  Allergen Reactions  . Adhesive [Tape] Other (See Comments)    Irritation and pulls off skin   Contact information for after-discharge care    Destination    [facility] Preferred SNF .   Service: Skilled Nursing Contact information: [place] [phone]               The results of significant diagnostics from this hospitalization (including imaging, microbiology, ancillary and laboratory) are listed below for reference.    Significant Diagnostic Studies: CT PELVIS WO CONTRAST  Result Date: 07/06/2019 CLINICAL DATA:  Left hip pain after fall. EXAM: CT PELVIS WITHOUT CONTRAST TECHNIQUE: Multidetector CT imaging of the pelvis was performed following the standard protocol without intravenous contrast. COMPARISON:  None. FINDINGS: Urinary Tract:  Collapsed urinary bladder. Bowel:  No evidence of inflammation or obstruction. Vascular/Lymphatic: Diffuse atherosclerotic plaque. Reproductive:  Small calcified fibroid. Other:  Negative for ascites. Musculoskeletal: No visible fracture.  No dislocation. Lower lumbar disc narrowing and facet arthropathy. Sacral stimulator on the right. IMPRESSION: No malalignment or visible fracture. Electronically Signed   By: Monte Fantasia M.D.   On: 07/06/2019 04:21   DG Chest Port 1V same Day  Result Date: 07/07/2019 CLINICAL DATA:  Hypoxia, dementia, diabetes. EXAM: PORTABLE CHEST 1 VIEW COMPARISON:  Chest x-ray dated 04/28/2010. FINDINGS: Heart size and mediastinal contours are within normal limits. Lungs are clear. No pleural effusion or pneumothorax is seen. Osseous structures about the chest are unremarkable.  IMPRESSION: No active disease. No evidence of pneumonia or pulmonary edema. Electronically Signed   By: Franki Cabot M.D.   On: 07/07/2019 09:19   DG Knee Complete 4 Views Left  Result Date: 07/06/2019 CLINICAL DATA:  Fall pain to the knee EXAM: LEFT KNEE - COMPLETE 4+ VIEW COMPARISON:  None. FINDINGS: The patient is status post left total knee arthroplasty. No periprosthetic lucency or fracture is identified. There is a small knee joint effusion. There is diffuse osteopenia. Scattered vascular calcifications are noted. IMPRESSION: Status post left total knee arthroplasty without complication. Electronically Signed   By: Prudencio Pair M.D.   On: 07/06/2019 01:49   DG Hip Unilat W or Wo Pelvis 2-3 Views Left  Result Date: 07/06/2019 CLINICAL DATA:  Fall and pain EXAM: DG HIP (WITH OR WITHOUT PELVIS) 2-3V LEFT COMPARISON:  None. FINDINGS: Limited examination due to technique. No definite fracture seen. There is moderate bilateral hip osteoarthritis with superior joint space loss. Diffuse osteopenia is noted. IMPRESSION: No definite fracture. If there is high clinical suspicion for occult hip fracture or the patient refuses to weightbear, consider further evaluation with cross-sectional imaging. Electronically Signed   By: Prudencio Pair M.D.   On: 07/06/2019 01:50    Microbiology: Recent Results (from the past 240 hour(s))  SARS Coronavirus 2 by RT PCR (hospital order, performed in [facility] hospital lab) Nasopharyngeal Nasopharyngeal Swab     Status: None   Collection Time: 07/06/19  6:35 AM   Specimen: Nasopharyngeal Swab  Result Value Ref Range Status   SARS Coronavirus 2 NEGATIVE NEGATIVE Final    Comment: (NOTE) SARS-CoV-2 target nucleic acids are NOT DETECTED. The SARS-CoV-2 RNA is generally detectable in upper and lower respiratory specimens during the acute phase of infection. The lowest concentration of SARS-CoV-2 viral copies this assay can detect is 250 copies / mL. A negative result  does not preclude SARS-CoV-2 infection and should not be used as the sole basis for treatment or other patient management decisions.  A negative result may occur with improper specimen collection / handling, submission of specimen other than nasopharyngeal swab, presence of viral mutation(s) within the areas targeted by this assay, and inadequate number of viral copies (<250 copies / mL). A negative result must be combined with clinical observations, patient history, and epidemiological information. Fact Sheet for Patients:   StrictlyIdeas.no Fact Sheet for Healthcare Providers: BankingDealers.co.za This test is not yet approved or cleared  by the [place] FDA and has been authorized for detection and/or diagnosis of SARS-CoV-2 by FDA under an Emergency Use Authorization (EUA).  This EUA will remain in effect (meaning this test can be used) for the duration of the COVID-19 declaration under Section 564(b)(1) of the Act, 21 U.S.C. section 360bbb-3(b)(1), unless the authorization is terminated or revoked  sooner. Performed at [facility], [place] [address] [place],  [address]      Labs: Basic Metabolic Panel: Recent Labs  Lab 07/06/19 0440 07/07/19 0607  NA 137 137  K 4.5 4.0  CL 102 103  CO2 24 23  GLUCOSE 134* 127*  BUN 28* 19  CREATININE 1.46* 0.98  CALCIUM 9.4 9.5  MG  --  1.8  PHOS  --  2.5   Liver Function Tests: Recent Labs  Lab 07/07/19 0607  AST 16  ALT 14  ALKPHOS 47  BILITOT 1.0  PROT 7.9  ALBUMIN 4.3   No results for input(s): LIPASE, AMYLASE in the last 168 hours. No results for input(s): AMMONIA in the last 168 hours. CBC: Recent Labs  Lab 07/06/19 0440 07/07/19 0607  WBC 10.8* 8.9  NEUTROABS 7.4  --   HGB 13.0 12.9  HCT 41.4 41.1  MCV 100.0 99.0  PLT 246 234    CBG: Recent Labs  Lab 07/11/19 0739 07/11/19 1129 07/11/19 1644 07/11/19 2039 07/12/19 0725   GLUCAP 124* 151* 126* 166* 130*   Signed:  Raiford Noble, DO  [facility] 07/12/2019, 11:19 AM

## 2019-07-15 DIAGNOSIS — W19XXXA Unspecified fall, initial encounter: Secondary | ICD-10-CM | POA: Diagnosis not present

## 2019-07-15 DIAGNOSIS — I10 Essential (primary) hypertension: Secondary | ICD-10-CM | POA: Diagnosis not present

## 2019-07-15 DIAGNOSIS — S8002XA Contusion of left knee, initial encounter: Secondary | ICD-10-CM | POA: Diagnosis not present

## 2019-07-15 DIAGNOSIS — Z8673 Personal history of transient ischemic attack (TIA), and cerebral infarction without residual deficits: Secondary | ICD-10-CM | POA: Diagnosis not present

## 2019-07-18 DIAGNOSIS — H3561 Retinal hemorrhage, right eye: Secondary | ICD-10-CM | POA: Diagnosis not present

## 2019-07-18 DIAGNOSIS — H3509 Other intraretinal microvascular abnormalities: Secondary | ICD-10-CM | POA: Diagnosis not present

## 2019-07-20 ENCOUNTER — Other Ambulatory Visit: Payer: Self-pay | Admitting: *Deleted

## 2019-07-20 NOTE — Patient Outreach (Signed)
Late entry for 07/19/19  Screened for potential [facility] Care Management needs as a benefit of  [employer] Medicare.  Member is receiving skilled therapy at [facility].  Writer attended telephonic interdisciplinary team meeting to assess for disposition needs and transition plan for resident.   Facility reports member is from home with son. Son works. Member has confusion. Facility recommendiation is 24/7 caregiver assistance or PACE program. To discuss with son.  Will continue to follow for transition plans and potential [facility] needs while member resides in SNF.    Marthenia Rolling, MSN-Ed, RN,BSN [place] Acute Care Coordinator [phone] [facility]) [phone]  (Toll free office)

## 2019-07-26 ENCOUNTER — Other Ambulatory Visit: Payer: Self-pay | Admitting: *Deleted

## 2019-07-26 NOTE — Patient Outreach (Signed)
[facility] Post-Acute Care Coordinator follow up  [name] is currently receiving skilled therapy at [facility].   Update received from facility SW who states member will transition to home next week. [name] is from home with son. However, son has expressed concerns about being able to care for [name] at home. [facility] SW requests writer's assistance with transition needs.   Telephone call made to [name] (son) [phone]. Patient identifiers confirmed. [name] reports he is surprised by [name]'s upcoming discharge from SNF. States he feels like his mother can benefit from more days. However, [name] endorses that facility therapist has stated member has reached her potential.   [name] states they cannot afford private caregiver assistance. States he reached out to DSS and was informed that [name] will not qualify for Medicaid. She makes over the income threshold. [name] states he is a Games developer and usually works nights. However, he is concerned that he is not going to be able to work due to the amount of care [name] will require when she gets home. States he and [name] will be horrified if/when he has to clean her up. States member was able to wash her own self up and go use the bathroom independently prior to most recent admission.   Discussed that member will likely need a hospital bed and possibly a hoyer lift if she is not ambulating. [name] states he plans on discussing equipment needs with the facility during upcoming meeting.   Explained [facility] Care Management services and [facility] for home visits. [name] is agreeable. States he welcomes any assistance that can be provided. Explained services will not interfere or replace home health.  Also discussed that Probation officer will seek approval from [place] Management Assistant Director for [facility] Prov[facility]. Discussed that Home Care Prov[facility] will be temporary but can give him more time to develop a long  term plan.   [name] was very appreciative of the call and assistance.   Received approval from [place] Director for [place] [facility] caregiver assistance for 3 times a week for 3 weeks.   Will make referral to [facility] and [facility] and [place].   Will update facility and follow for transition date and further plans.   Tamara Rolling, MSN-Ed, RN,BSN [place] Acute Care Coordinator [phone] [facility]) [phone]  (Toll free office)

## 2019-07-30 ENCOUNTER — Other Ambulatory Visit: Payer: Self-pay | Admitting: *Deleted

## 2019-07-30 NOTE — Patient Outreach (Addendum)
[facility] Post- Acute Care Coordinator follow up  Telephone call made to Nyeisha Goodall (son) [phone]. Patient identifiers confirmed.   [name] reports member will come home on tomorrow 07/31/19. Made [name] aware that approval was received for [facility] for nursing assistance. Discussed that [place] [facility] will be for a limited time of 3 weeks for a limited amount of hours (2-3) for 2 to 3 times a week. Discussed that [place] [facility] will call him to discuss further details and to schedule visit.   Also discussed [facility] referral for home visits. Discussed that writer will alert [facility] of pending dc tomorrow. [facility] to contact him for scheduling visit.  [name] also reports he is need of assistance from social work for ongoing level of care concerns. States " I need all of the help I can get." Agreeable to [place] and [facility] LCSW referrals.   [name] is not certain of which home health agency his mother will have. Writer to follow up with facility SW.  Will make referral to [place] and [facility] LCSW. Will alert [facility] and [facility] of member's transition to home tomorrow 07/31/19.   Son Fabiana Dromgoole is primary contact [phone].   Mrs. Coudriet has medical history HTN, HLD, DM, AKI, dehydration, dementia.   Addendum: Update received from facility SW confirming member will have [facility]. Also states member's son is interested in hospice services. Not sure if hospice eligible. Writer spoke with [name] (son) who is agreeable to palliative care referral for goals of care discussions and the like. Referral made to [facility] palliative.    Marthenia Rolling, MSN-Ed, RN,BSN [place] Acute Care Coordinator [phone] [facility]) [phone]  (Toll free office)

## 2019-08-02 ENCOUNTER — Other Ambulatory Visit: Payer: Self-pay | Admitting: *Deleted

## 2019-08-02 DIAGNOSIS — N183 Chronic kidney disease, stage 3 unspecified: Secondary | ICD-10-CM | POA: Diagnosis not present

## 2019-08-02 DIAGNOSIS — I129 Hypertensive chronic kidney disease with stage 1 through stage 4 chronic kidney disease, or unspecified chronic kidney disease: Secondary | ICD-10-CM | POA: Diagnosis not present

## 2019-08-02 DIAGNOSIS — G40909 Epilepsy, unspecified, not intractable, without status epilepticus: Secondary | ICD-10-CM | POA: Diagnosis not present

## 2019-08-02 DIAGNOSIS — H538 Other visual disturbances: Secondary | ICD-10-CM | POA: Diagnosis not present

## 2019-08-02 DIAGNOSIS — R627 Adult failure to thrive: Secondary | ICD-10-CM | POA: Diagnosis not present

## 2019-08-02 DIAGNOSIS — M85852 Other specified disorders of bone density and structure, left thigh: Secondary | ICD-10-CM | POA: Diagnosis not present

## 2019-08-02 DIAGNOSIS — F0390 Unspecified dementia without behavioral disturbance: Secondary | ICD-10-CM | POA: Diagnosis not present

## 2019-08-02 DIAGNOSIS — E1122 Type 2 diabetes mellitus with diabetic chronic kidney disease: Secondary | ICD-10-CM | POA: Diagnosis not present

## 2019-08-02 DIAGNOSIS — N1831 Chronic kidney disease, stage 3a: Secondary | ICD-10-CM | POA: Diagnosis not present

## 2019-08-02 DIAGNOSIS — J449 Chronic obstructive pulmonary disease, unspecified: Secondary | ICD-10-CM | POA: Diagnosis not present

## 2019-08-02 DIAGNOSIS — Z96652 Presence of left artificial knee joint: Secondary | ICD-10-CM | POA: Diagnosis not present

## 2019-08-02 DIAGNOSIS — D692 Other nonthrombocytopenic purpura: Secondary | ICD-10-CM | POA: Diagnosis not present

## 2019-08-02 DIAGNOSIS — Z79899 Other long term (current) drug therapy: Secondary | ICD-10-CM | POA: Diagnosis not present

## 2019-08-02 DIAGNOSIS — R413 Other amnesia: Secondary | ICD-10-CM | POA: Diagnosis not present

## 2019-08-02 DIAGNOSIS — E860 Dehydration: Secondary | ICD-10-CM | POA: Diagnosis not present

## 2019-08-02 DIAGNOSIS — M160 Bilateral primary osteoarthritis of hip: Secondary | ICD-10-CM | POA: Diagnosis not present

## 2019-08-02 DIAGNOSIS — E039 Hypothyroidism, unspecified: Secondary | ICD-10-CM | POA: Diagnosis not present

## 2019-08-02 DIAGNOSIS — E785 Hyperlipidemia, unspecified: Secondary | ICD-10-CM | POA: Diagnosis not present

## 2019-08-02 DIAGNOSIS — Z7902 Long term (current) use of antithrombotics/antiplatelets: Secondary | ICD-10-CM | POA: Diagnosis not present

## 2019-08-02 DIAGNOSIS — Z7951 Long term (current) use of inhaled steroids: Secondary | ICD-10-CM | POA: Diagnosis not present

## 2019-08-02 DIAGNOSIS — E1140 Type 2 diabetes mellitus with diabetic neuropathy, unspecified: Secondary | ICD-10-CM | POA: Diagnosis not present

## 2019-08-02 DIAGNOSIS — M47816 Spondylosis without myelopathy or radiculopathy, lumbar region: Secondary | ICD-10-CM | POA: Diagnosis not present

## 2019-08-02 DIAGNOSIS — F3341 Major depressive disorder, recurrent, in partial remission: Secondary | ICD-10-CM | POA: Diagnosis not present

## 2019-08-02 DIAGNOSIS — M48061 Spinal stenosis, lumbar region without neurogenic claudication: Secondary | ICD-10-CM | POA: Diagnosis not present

## 2019-08-02 DIAGNOSIS — M85862 Other specified disorders of bone density and structure, left lower leg: Secondary | ICD-10-CM | POA: Diagnosis not present

## 2019-08-02 DIAGNOSIS — Z8673 Personal history of transient ischemic attack (TIA), and cerebral infarction without residual deficits: Secondary | ICD-10-CM | POA: Diagnosis not present

## 2019-08-02 DIAGNOSIS — I6990 Unspecified sequelae of unspecified cerebrovascular disease: Secondary | ICD-10-CM | POA: Diagnosis not present

## 2019-08-02 DIAGNOSIS — Z794 Long term (current) use of insulin: Secondary | ICD-10-CM | POA: Diagnosis not present

## 2019-08-02 DIAGNOSIS — R269 Unspecified abnormalities of gait and mobility: Secondary | ICD-10-CM | POA: Diagnosis not present

## 2019-08-02 DIAGNOSIS — Z9181 History of falling: Secondary | ICD-10-CM | POA: Diagnosis not present

## 2019-08-02 NOTE — Patient Outreach (Signed)
[facility]) Care Management  08/02/2019  [name] 1934-09-15 [id number]   Referral Received: 6/7 Discharged via SNF: 6/8 Initial Outreach 6/10  Telephone Assessment-Enrolled-COPD  RN spoke with the pt's son [name]) due to pt's cognitive status. Caregiver states several agencies are involved with [facility], [facility]. Pt states [facility] has visited and pt is doing well however remains weak. [facility] nurse will inquire on possible orders for PT services in the home not initial ordered. RN further explained [facility] services and the purpose for today's call for services (son receptive).  RN discussed pt's overall health condition with diabetes-son reports visit to the provider's office today and pt was taken off all insulin dose medication to prevent her blood sugars from dropping and pt's A1c was good. HTN remains stable and pt has COPD however caregiver has a lack of knowledge related to the COPD action plan and COPD definition concerning the risk. RN offered to enroll pt into the COPD program and educated pt's son on COPD and the importance of having an action plan in place to prevent acute events. Discuss the COPD action plan and verified pt remains in the GREEN with no acute problems at this time.   Based upon the above information RN discussed and generated a plan of care with goals and interventions. Offered to mail education information on EMMI for COPD and discuss ed the risk involved if this is not monitored (verbalized an understanding). Will offered weekly transition of care however son prefers monthly contacts for ongoing [facility] services. RN also informed caregiver concerning other team members with [facility] for social work and pharmacy needs. Caregiver aware RN will communicate with the pt's primary care provider quarterly concerning pt's ongoing progress with [facility] services.  Plan: Will follow up next month with COPD disease management services and continue to  educate accordingly with managing this condition.  [facility] CM Care Plan Problem One     Most Recent Value  Care Plan Problem One Deficinet Knowledge Related to COPD unfamiliarity with information /educatoin  Role Documenting the Problem One Care Management Telephonic Coordinator  Care Plan for Problem One Active  [facility] Long Term Goal  Pt will verbalize the plan of actionin the YELLOW zone within the next 90 days.  [facility] Long Term Goal Start Date 08/02/19  Interventions for Problem One Long Term Goal Will educate on the COPD action plan and the improtance of pt being in the GREEN zone to prevent acute problems from occurring. Will send educational printable information on COPD action plan and discuss what to do if acute symptoms should occur.  THN CM Short Term Goal #1  Adherence with all post op (SNF) medications within the next 30 days.  [facility] CM Short Term Goal #1 Start Date 08/02/19  Interventions for Short Term Goal #1 Will discuss the improtance of pt following the medication regimen with all medications. Will strongly encouraged pt to start all new medications and stop the medications the pt's provider have indicated to no longer take.  [facility] CM Short Term Goal #2  Adherence with all post op scheduled medical appointment within the next 30 days.  [facility] CM Short Term Goal #2 Start Date 08/02/19  Interventions for Short Term Goal #2 Will discuss all medical appointments and verify pt has sufficient transportation. Will also verified if pt has a follow up visit with her primary provided since arriving home (occurred today).      Raina Mina, RN Care Management Coordinator [place]  Network [employer] [phone]

## 2019-08-03 DIAGNOSIS — M160 Bilateral primary osteoarthritis of hip: Secondary | ICD-10-CM | POA: Diagnosis not present

## 2019-08-03 DIAGNOSIS — F0390 Unspecified dementia without behavioral disturbance: Secondary | ICD-10-CM | POA: Diagnosis not present

## 2019-08-03 DIAGNOSIS — J449 Chronic obstructive pulmonary disease, unspecified: Secondary | ICD-10-CM | POA: Diagnosis not present

## 2019-08-03 DIAGNOSIS — I129 Hypertensive chronic kidney disease with stage 1 through stage 4 chronic kidney disease, or unspecified chronic kidney disease: Secondary | ICD-10-CM | POA: Diagnosis not present

## 2019-08-03 DIAGNOSIS — N183 Chronic kidney disease, stage 3 unspecified: Secondary | ICD-10-CM | POA: Diagnosis not present

## 2019-08-03 DIAGNOSIS — E1122 Type 2 diabetes mellitus with diabetic chronic kidney disease: Secondary | ICD-10-CM | POA: Diagnosis not present

## 2019-08-07 DIAGNOSIS — N183 Chronic kidney disease, stage 3 unspecified: Secondary | ICD-10-CM | POA: Diagnosis not present

## 2019-08-07 DIAGNOSIS — F0390 Unspecified dementia without behavioral disturbance: Secondary | ICD-10-CM | POA: Diagnosis not present

## 2019-08-07 DIAGNOSIS — M160 Bilateral primary osteoarthritis of hip: Secondary | ICD-10-CM | POA: Diagnosis not present

## 2019-08-07 DIAGNOSIS — I129 Hypertensive chronic kidney disease with stage 1 through stage 4 chronic kidney disease, or unspecified chronic kidney disease: Secondary | ICD-10-CM | POA: Diagnosis not present

## 2019-08-07 DIAGNOSIS — E1122 Type 2 diabetes mellitus with diabetic chronic kidney disease: Secondary | ICD-10-CM | POA: Diagnosis not present

## 2019-08-07 DIAGNOSIS — J449 Chronic obstructive pulmonary disease, unspecified: Secondary | ICD-10-CM | POA: Diagnosis not present

## 2019-08-08 DIAGNOSIS — E1122 Type 2 diabetes mellitus with diabetic chronic kidney disease: Secondary | ICD-10-CM | POA: Diagnosis not present

## 2019-08-08 DIAGNOSIS — M160 Bilateral primary osteoarthritis of hip: Secondary | ICD-10-CM | POA: Diagnosis not present

## 2019-08-08 DIAGNOSIS — F0390 Unspecified dementia without behavioral disturbance: Secondary | ICD-10-CM | POA: Diagnosis not present

## 2019-08-08 DIAGNOSIS — J449 Chronic obstructive pulmonary disease, unspecified: Secondary | ICD-10-CM | POA: Diagnosis not present

## 2019-08-08 DIAGNOSIS — I129 Hypertensive chronic kidney disease with stage 1 through stage 4 chronic kidney disease, or unspecified chronic kidney disease: Secondary | ICD-10-CM | POA: Diagnosis not present

## 2019-08-08 DIAGNOSIS — N183 Chronic kidney disease, stage 3 unspecified: Secondary | ICD-10-CM | POA: Diagnosis not present

## 2019-08-09 ENCOUNTER — Encounter: Payer: Self-pay | Admitting: *Deleted

## 2019-08-09 DIAGNOSIS — J449 Chronic obstructive pulmonary disease, unspecified: Secondary | ICD-10-CM | POA: Diagnosis not present

## 2019-08-09 DIAGNOSIS — I129 Hypertensive chronic kidney disease with stage 1 through stage 4 chronic kidney disease, or unspecified chronic kidney disease: Secondary | ICD-10-CM | POA: Diagnosis not present

## 2019-08-09 DIAGNOSIS — E1122 Type 2 diabetes mellitus with diabetic chronic kidney disease: Secondary | ICD-10-CM | POA: Diagnosis not present

## 2019-08-09 DIAGNOSIS — M160 Bilateral primary osteoarthritis of hip: Secondary | ICD-10-CM | POA: Diagnosis not present

## 2019-08-09 DIAGNOSIS — F0390 Unspecified dementia without behavioral disturbance: Secondary | ICD-10-CM | POA: Diagnosis not present

## 2019-08-09 DIAGNOSIS — N183 Chronic kidney disease, stage 3 unspecified: Secondary | ICD-10-CM | POA: Diagnosis not present

## 2019-08-10 ENCOUNTER — Telehealth: Payer: Self-pay | Admitting: *Deleted

## 2019-08-10 NOTE — Telephone Encounter (Signed)
Called patient's son, [name], regarding scheduling a Palliative care home visit to discuss goals of care for his mom. Contact information left on his voicemail for return call.

## 2019-08-10 NOTE — Telephone Encounter (Signed)
Received a call back from patient's son, Dellis Filbert. Palliative care visit scheduled for 6/23@1p .

## 2019-08-14 DIAGNOSIS — F0390 Unspecified dementia without behavioral disturbance: Secondary | ICD-10-CM | POA: Diagnosis not present

## 2019-08-14 DIAGNOSIS — E1122 Type 2 diabetes mellitus with diabetic chronic kidney disease: Secondary | ICD-10-CM | POA: Diagnosis not present

## 2019-08-14 DIAGNOSIS — I129 Hypertensive chronic kidney disease with stage 1 through stage 4 chronic kidney disease, or unspecified chronic kidney disease: Secondary | ICD-10-CM | POA: Diagnosis not present

## 2019-08-14 DIAGNOSIS — J449 Chronic obstructive pulmonary disease, unspecified: Secondary | ICD-10-CM | POA: Diagnosis not present

## 2019-08-14 DIAGNOSIS — M160 Bilateral primary osteoarthritis of hip: Secondary | ICD-10-CM | POA: Diagnosis not present

## 2019-08-14 DIAGNOSIS — N183 Chronic kidney disease, stage 3 unspecified: Secondary | ICD-10-CM | POA: Diagnosis not present

## 2019-08-15 ENCOUNTER — Other Ambulatory Visit: Payer: Medicare Other

## 2019-08-15 ENCOUNTER — Other Ambulatory Visit: Payer: Self-pay | Admitting: *Deleted

## 2019-08-15 ENCOUNTER — Other Ambulatory Visit: Payer: Self-pay

## 2019-08-15 ENCOUNTER — Other Ambulatory Visit: Payer: Medicare Other | Admitting: *Deleted

## 2019-08-15 ENCOUNTER — Encounter: Payer: Self-pay | Admitting: *Deleted

## 2019-08-15 VITALS — BP 121/85 | HR 81 | Temp 97.0°F | Resp 18

## 2019-08-15 DIAGNOSIS — Z515 Encounter for palliative care: Secondary | ICD-10-CM

## 2019-08-15 NOTE — Patient Outreach (Signed)
[facility]) Care Management  08/15/2019  [name] February 03, 1935 [id number]   Telephone Assessment-Requested a cal back  RN spoke with the pt's [name]. RN inquired on completing the initial assessment as planned however pt pending an appointment today with [facility] for enrollment and requested a call back for later today.  Plan: RN will call back later today for completion of the assessment as requested.  Raina Mina, RN Care Management Coordinator [place] Office [phone]

## 2019-08-15 NOTE — Patient Outreach (Signed)
[facility]) Care Management  08/15/2019  [name] 22-Jan-1935 [id number]   Telephone Assessment-completed the initial assessment  RN spoke with son and completed the initial assessment as scheduled for today. Also verified all involved services with [facility] (Palliative), [facility] with aide services, [facility] and [facility]. Pt will  [place] services once Landry Corporal has completed their services. No other needs presented as pt is continue to do well with live in son to assist with pt's ADL.  Plan: Will follow up next month with ongoing case management services.  Raina Mina, RN Care Management Coordinator [place] Office [phone]

## 2019-08-16 ENCOUNTER — Other Ambulatory Visit: Payer: Self-pay

## 2019-08-16 DIAGNOSIS — I129 Hypertensive chronic kidney disease with stage 1 through stage 4 chronic kidney disease, or unspecified chronic kidney disease: Secondary | ICD-10-CM | POA: Diagnosis not present

## 2019-08-16 DIAGNOSIS — M160 Bilateral primary osteoarthritis of hip: Secondary | ICD-10-CM | POA: Diagnosis not present

## 2019-08-16 DIAGNOSIS — E1122 Type 2 diabetes mellitus with diabetic chronic kidney disease: Secondary | ICD-10-CM | POA: Diagnosis not present

## 2019-08-16 DIAGNOSIS — N183 Chronic kidney disease, stage 3 unspecified: Secondary | ICD-10-CM | POA: Diagnosis not present

## 2019-08-16 DIAGNOSIS — F0390 Unspecified dementia without behavioral disturbance: Secondary | ICD-10-CM | POA: Diagnosis not present

## 2019-08-16 DIAGNOSIS — J449 Chronic obstructive pulmonary disease, unspecified: Secondary | ICD-10-CM | POA: Diagnosis not present

## 2019-08-21 DIAGNOSIS — E1122 Type 2 diabetes mellitus with diabetic chronic kidney disease: Secondary | ICD-10-CM | POA: Diagnosis not present

## 2019-08-21 DIAGNOSIS — I129 Hypertensive chronic kidney disease with stage 1 through stage 4 chronic kidney disease, or unspecified chronic kidney disease: Secondary | ICD-10-CM | POA: Diagnosis not present

## 2019-08-21 DIAGNOSIS — J449 Chronic obstructive pulmonary disease, unspecified: Secondary | ICD-10-CM | POA: Diagnosis not present

## 2019-08-21 DIAGNOSIS — F0390 Unspecified dementia without behavioral disturbance: Secondary | ICD-10-CM | POA: Diagnosis not present

## 2019-08-21 DIAGNOSIS — N183 Chronic kidney disease, stage 3 unspecified: Secondary | ICD-10-CM | POA: Diagnosis not present

## 2019-08-21 DIAGNOSIS — M160 Bilateral primary osteoarthritis of hip: Secondary | ICD-10-CM | POA: Diagnosis not present

## 2019-08-21 NOTE — Progress Notes (Signed)
COMMUNITY PALLIATIVE CARE SW NOTE  PATIENT NAME: [name] DOB: 1934-04-30 MRN: [id number]  PRIMARY CARE PROVIDER: Shon Baton, MD  RESPONSIBLE PARTY:  Acct ID - Guarantor Home Phone Work Phone Relationship Acct Type  [id number] [name] [phone]  Self P/F     [address], [name], [place] [address]     PLAN OF CARE and INTERVENTIONS:             1. GOALS OF CARE/ ADVANCE CARE PLANNING: Goal is for patient to remain in her home and maintain her independence. Patient is a DNR. 2. SOCIAL/EMOTIONAL/SPIRITUAL ASSESSMENT/ INTERVENTIONS:  SW and RN-[name] visited with patient at her home. She was present with her son-[name]. Patent was sitting in her recliner, eating lunch. She she denied pain. Patient was able to provide appropriate responses to simple yes/no questions and short answers. Her son provided social and medical history on patient. Currently patient status: discomfort/weak knees, easily fatigued, early dementia, history of strokes, ambulation is unsteady and slow, weight loss since hospitalizations and she is receiving physical therapy 2x's week and will receive personal care assistance through [facility]. Patient was eating 3 meals a day, but that has decreased to two or three good meals and smaller snacks. Patient eat mostly finger foods as she has difficulty gripping the utensils.Patient is also receiving support through [facility]. Patient go out for medical appointments. Her son is her primary caregiver, who has taken leave from his job to care for patient. His sister is also active in patient's care. Patient was born in [place], [place]. 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 The team introduced themselves to patient and her son, provided education regarding the program, visit frequencies and how to access additional support. SW assessed needs, comfort and coping of  patient and PCG. Active and supportive listening, reassurance of support; obtained social/medical history on patient. 3. PATIENT/CAREGIVER EDUCATION/ COPING: Patient seems to be coping well. She was quiet unless asked a direct questions. She appeared to look to her son to provide information. She appeared to be comfortable and content being at home, watching TV. Her son-[name] is exhausted as he is the primary caregiver, but is dedicated to his mom's care. He copes by staying busy and he has hobbies that is involved in. 4. PERSONAL EMERGENCY PLAN:  911 can be access for any emergencies. SW reinforced access to palliative care support or patient's primary care physician.  5. COMMUNITY RESOURCES COORDINATION/ HEALTH CARE NAVIGATION:  Patient is currently receiving physical therapy through [facility]. She receives personal care assistance through [facility] providers. Patient has support through [facility]. 6. FINANCIAL/LEGAL CONCERNS/INTERVENTIONS:  No financial or legal issues/concerns.      SOCIAL HX:  Social History   Tobacco Use  . Smoking status: Former Smoker    Quit date: 11/02/2004    Years since quitting: 14.8  . Smokeless tobacco: Never Used  Substance Use Topics  . Alcohol use: No    CODE STATUS: DNR ADVANCED DIRECTIVES: Yes, patient has POA/HCPOA; MOST FORM MOST FORM COMPLETE: Yes, patient has completed HOSPICE EDUCATION PROVIDED: Yes  PPS: Patient ambulates with a walker but her gait is unsteady. She has some dementia. She requires assistance with ADLs.   Duration of visit and documentation 75 minutes     Tamara Puller, LCSW

## 2019-08-23 DIAGNOSIS — N183 Chronic kidney disease, stage 3 unspecified: Secondary | ICD-10-CM | POA: Diagnosis not present

## 2019-08-23 DIAGNOSIS — I129 Hypertensive chronic kidney disease with stage 1 through stage 4 chronic kidney disease, or unspecified chronic kidney disease: Secondary | ICD-10-CM | POA: Diagnosis not present

## 2019-08-23 DIAGNOSIS — F0390 Unspecified dementia without behavioral disturbance: Secondary | ICD-10-CM | POA: Diagnosis not present

## 2019-08-23 DIAGNOSIS — M160 Bilateral primary osteoarthritis of hip: Secondary | ICD-10-CM | POA: Diagnosis not present

## 2019-08-23 DIAGNOSIS — J449 Chronic obstructive pulmonary disease, unspecified: Secondary | ICD-10-CM | POA: Diagnosis not present

## 2019-08-23 DIAGNOSIS — E1122 Type 2 diabetes mellitus with diabetic chronic kidney disease: Secondary | ICD-10-CM | POA: Diagnosis not present

## 2019-08-23 NOTE — Progress Notes (Signed)
COMMUNITY PALLIATIVE CARE RN NOTE  PATIENT NAME: [name] DOB: 09-18-1934 MRN: [id number]  PRIMARY CARE PROVIDER: Creola Corn, MD  RESPONSIBLE PARTY: Griffith Citron (son) Acct ID - Guarantor Home Phone Work Phone Relationship Acct Type  [id number] RONA, TOMSON [phone]  Self P/F     [address], [place], [place] [address]   Covid-19 Pre-screening Negative  PLAN OF CARE and INTERVENTION:  1. ADVANCE CARE PLANNING/GOALS OF CARE: Goal is for patient to live the best quality of life possible and more independence. 2. PATIENT/CAREGIVER EDUCATION: Explained Palliative care services, safe mobility/transfers 3. DISEASE STATUS: Joint visit made with Palliative care SW, M. Lonon. Met with patient and her son, [name], in their home. Upon arrival, patient is sitting up in her recliner feeding herself chicken tenders. She is able to answer simple questions and make her needs known. She is forgetful at times. Her mood is pleasant. Her son states that she has dementia and a history of strokes. She experiences pain in bilateral knees. She has had knee replacements in both knees in the past. He applies Blue emu cream to help. He says that she could receive Cortisone injections, but not sure of how helpful this will be at this point. She is ambulatory using her rollator walker. She is only able to walk back and forth to her bathroom twice then she is tired for the rest of the day. She is currently working with PT/OT. PT has one more week of 2x/week then it will be 1x/week for 3 more weeks. She has two more visits left with OT. The goal is for her to get 50-60% of her strength back. She has a bath attendant that assists with personal care 2x/week. She has personal care services through [facility] for now, then when they are done they will receive assistance through [place] [facility] health services for 3 weeks. Patient is unable to be left alone for long periods of time. Her daughter lives close by and assists with  baths and personal care when she is able. She goes to the beauty shop 1x/week and receives eye injections 1x/month in her eyes. She has an occlusion in her left eye and decreased vision in her right eye. Her intake has decreased some over the past few days, but she is eating 3 meals/day. She is a diabetic. She does experience some dyspnea with exertion, but recovers fairly quickly when at rest. She does wear Depends in case of urinary incontinence. Patient is planning to take a trip with her daughter at the end of next month and son hopes that she will be able to accomplish this. [name] gave verbal consent for ongoing visits with Palliative care. Will continue to monitor.  HISTORY OF PRESENT ILLNESS:  This is a 85 yo female with a diagnosis of dementia without behavioral disturbances. She has a h/o cerebellar stroke, TIAs, DM, ataxia and generalized weakness.  Palliative care has been asked to follow patient for additional support. Will visit patient monthly and PRN.  CODE STATUS: DNR ADVANCED DIRECTIVES: Y MOST FORM: yes PPS: 40%   PHYSICAL EXAM:   VITALS: Today's Vitals   08/15/19 1319  BP: 121/85  Pulse: 81  Resp: 18  Temp: (!) 97 F (36.1 C)  TempSrc: Temporal  SpO2: 95%  PainSc: 0-No pain    LUNGS: clear to auscultation  CARDIAC: Cor RRR EXTREMITIES: Trace edema to bilateral lower extremities SKIN: Thin/frail skin; scattered bruising noted to bilateral legs  NEURO: Alert and oriented to person/place, forgetful, pleasant mood,  increased generalized weakness, ambulatory w/ rollator walker   (Duration of visit and documentation 90 minutes)   Daryl Eastern, RN BSN

## 2019-08-24 DIAGNOSIS — F0390 Unspecified dementia without behavioral disturbance: Secondary | ICD-10-CM | POA: Diagnosis not present

## 2019-08-24 DIAGNOSIS — M160 Bilateral primary osteoarthritis of hip: Secondary | ICD-10-CM | POA: Diagnosis not present

## 2019-08-24 DIAGNOSIS — J449 Chronic obstructive pulmonary disease, unspecified: Secondary | ICD-10-CM | POA: Diagnosis not present

## 2019-08-24 DIAGNOSIS — N183 Chronic kidney disease, stage 3 unspecified: Secondary | ICD-10-CM | POA: Diagnosis not present

## 2019-08-24 DIAGNOSIS — I129 Hypertensive chronic kidney disease with stage 1 through stage 4 chronic kidney disease, or unspecified chronic kidney disease: Secondary | ICD-10-CM | POA: Diagnosis not present

## 2019-08-24 DIAGNOSIS — E1122 Type 2 diabetes mellitus with diabetic chronic kidney disease: Secondary | ICD-10-CM | POA: Diagnosis not present

## 2019-08-28 DIAGNOSIS — I129 Hypertensive chronic kidney disease with stage 1 through stage 4 chronic kidney disease, or unspecified chronic kidney disease: Secondary | ICD-10-CM | POA: Diagnosis not present

## 2019-08-28 DIAGNOSIS — M160 Bilateral primary osteoarthritis of hip: Secondary | ICD-10-CM | POA: Diagnosis not present

## 2019-08-28 DIAGNOSIS — J449 Chronic obstructive pulmonary disease, unspecified: Secondary | ICD-10-CM | POA: Diagnosis not present

## 2019-08-28 DIAGNOSIS — N183 Chronic kidney disease, stage 3 unspecified: Secondary | ICD-10-CM | POA: Diagnosis not present

## 2019-08-28 DIAGNOSIS — F0390 Unspecified dementia without behavioral disturbance: Secondary | ICD-10-CM | POA: Diagnosis not present

## 2019-08-28 DIAGNOSIS — E1122 Type 2 diabetes mellitus with diabetic chronic kidney disease: Secondary | ICD-10-CM | POA: Diagnosis not present

## 2019-08-30 DIAGNOSIS — F0390 Unspecified dementia without behavioral disturbance: Secondary | ICD-10-CM | POA: Diagnosis not present

## 2019-08-30 DIAGNOSIS — E1122 Type 2 diabetes mellitus with diabetic chronic kidney disease: Secondary | ICD-10-CM | POA: Diagnosis not present

## 2019-08-30 DIAGNOSIS — J449 Chronic obstructive pulmonary disease, unspecified: Secondary | ICD-10-CM | POA: Diagnosis not present

## 2019-08-30 DIAGNOSIS — I129 Hypertensive chronic kidney disease with stage 1 through stage 4 chronic kidney disease, or unspecified chronic kidney disease: Secondary | ICD-10-CM | POA: Diagnosis not present

## 2019-08-30 DIAGNOSIS — M160 Bilateral primary osteoarthritis of hip: Secondary | ICD-10-CM | POA: Diagnosis not present

## 2019-08-30 DIAGNOSIS — N183 Chronic kidney disease, stage 3 unspecified: Secondary | ICD-10-CM | POA: Diagnosis not present

## 2019-08-31 DIAGNOSIS — J449 Chronic obstructive pulmonary disease, unspecified: Secondary | ICD-10-CM | POA: Diagnosis not present

## 2019-08-31 DIAGNOSIS — F0390 Unspecified dementia without behavioral disturbance: Secondary | ICD-10-CM | POA: Diagnosis not present

## 2019-08-31 DIAGNOSIS — E1122 Type 2 diabetes mellitus with diabetic chronic kidney disease: Secondary | ICD-10-CM | POA: Diagnosis not present

## 2019-08-31 DIAGNOSIS — I129 Hypertensive chronic kidney disease with stage 1 through stage 4 chronic kidney disease, or unspecified chronic kidney disease: Secondary | ICD-10-CM | POA: Diagnosis not present

## 2019-08-31 DIAGNOSIS — N183 Chronic kidney disease, stage 3 unspecified: Secondary | ICD-10-CM | POA: Diagnosis not present

## 2019-08-31 DIAGNOSIS — M160 Bilateral primary osteoarthritis of hip: Secondary | ICD-10-CM | POA: Diagnosis not present

## 2019-09-01 DIAGNOSIS — J449 Chronic obstructive pulmonary disease, unspecified: Secondary | ICD-10-CM | POA: Diagnosis not present

## 2019-09-01 DIAGNOSIS — M48061 Spinal stenosis, lumbar region without neurogenic claudication: Secondary | ICD-10-CM | POA: Diagnosis not present

## 2019-09-01 DIAGNOSIS — Z794 Long term (current) use of insulin: Secondary | ICD-10-CM | POA: Diagnosis not present

## 2019-09-01 DIAGNOSIS — Z8673 Personal history of transient ischemic attack (TIA), and cerebral infarction without residual deficits: Secondary | ICD-10-CM | POA: Diagnosis not present

## 2019-09-01 DIAGNOSIS — I129 Hypertensive chronic kidney disease with stage 1 through stage 4 chronic kidney disease, or unspecified chronic kidney disease: Secondary | ICD-10-CM | POA: Diagnosis not present

## 2019-09-01 DIAGNOSIS — M160 Bilateral primary osteoarthritis of hip: Secondary | ICD-10-CM | POA: Diagnosis not present

## 2019-09-01 DIAGNOSIS — Z79899 Other long term (current) drug therapy: Secondary | ICD-10-CM | POA: Diagnosis not present

## 2019-09-01 DIAGNOSIS — E1122 Type 2 diabetes mellitus with diabetic chronic kidney disease: Secondary | ICD-10-CM | POA: Diagnosis not present

## 2019-09-01 DIAGNOSIS — M85862 Other specified disorders of bone density and structure, left lower leg: Secondary | ICD-10-CM | POA: Diagnosis not present

## 2019-09-01 DIAGNOSIS — E860 Dehydration: Secondary | ICD-10-CM | POA: Diagnosis not present

## 2019-09-01 DIAGNOSIS — G40909 Epilepsy, unspecified, not intractable, without status epilepticus: Secondary | ICD-10-CM | POA: Diagnosis not present

## 2019-09-01 DIAGNOSIS — Z96652 Presence of left artificial knee joint: Secondary | ICD-10-CM | POA: Diagnosis not present

## 2019-09-01 DIAGNOSIS — E785 Hyperlipidemia, unspecified: Secondary | ICD-10-CM | POA: Diagnosis not present

## 2019-09-01 DIAGNOSIS — Z7951 Long term (current) use of inhaled steroids: Secondary | ICD-10-CM | POA: Diagnosis not present

## 2019-09-01 DIAGNOSIS — Z9181 History of falling: Secondary | ICD-10-CM | POA: Diagnosis not present

## 2019-09-01 DIAGNOSIS — M85852 Other specified disorders of bone density and structure, left thigh: Secondary | ICD-10-CM | POA: Diagnosis not present

## 2019-09-01 DIAGNOSIS — Z7902 Long term (current) use of antithrombotics/antiplatelets: Secondary | ICD-10-CM | POA: Diagnosis not present

## 2019-09-01 DIAGNOSIS — N183 Chronic kidney disease, stage 3 unspecified: Secondary | ICD-10-CM | POA: Diagnosis not present

## 2019-09-01 DIAGNOSIS — E039 Hypothyroidism, unspecified: Secondary | ICD-10-CM | POA: Diagnosis not present

## 2019-09-01 DIAGNOSIS — F0390 Unspecified dementia without behavioral disturbance: Secondary | ICD-10-CM | POA: Diagnosis not present

## 2019-09-01 DIAGNOSIS — M47816 Spondylosis without myelopathy or radiculopathy, lumbar region: Secondary | ICD-10-CM | POA: Diagnosis not present

## 2019-09-04 DIAGNOSIS — M160 Bilateral primary osteoarthritis of hip: Secondary | ICD-10-CM | POA: Diagnosis not present

## 2019-09-04 DIAGNOSIS — E1122 Type 2 diabetes mellitus with diabetic chronic kidney disease: Secondary | ICD-10-CM | POA: Diagnosis not present

## 2019-09-04 DIAGNOSIS — F0390 Unspecified dementia without behavioral disturbance: Secondary | ICD-10-CM | POA: Diagnosis not present

## 2019-09-04 DIAGNOSIS — I129 Hypertensive chronic kidney disease with stage 1 through stage 4 chronic kidney disease, or unspecified chronic kidney disease: Secondary | ICD-10-CM | POA: Diagnosis not present

## 2019-09-04 DIAGNOSIS — N183 Chronic kidney disease, stage 3 unspecified: Secondary | ICD-10-CM | POA: Diagnosis not present

## 2019-09-04 DIAGNOSIS — J449 Chronic obstructive pulmonary disease, unspecified: Secondary | ICD-10-CM | POA: Diagnosis not present

## 2019-09-06 DIAGNOSIS — N183 Chronic kidney disease, stage 3 unspecified: Secondary | ICD-10-CM | POA: Diagnosis not present

## 2019-09-06 DIAGNOSIS — F0390 Unspecified dementia without behavioral disturbance: Secondary | ICD-10-CM | POA: Diagnosis not present

## 2019-09-06 DIAGNOSIS — M160 Bilateral primary osteoarthritis of hip: Secondary | ICD-10-CM | POA: Diagnosis not present

## 2019-09-06 DIAGNOSIS — E1122 Type 2 diabetes mellitus with diabetic chronic kidney disease: Secondary | ICD-10-CM | POA: Diagnosis not present

## 2019-09-06 DIAGNOSIS — I129 Hypertensive chronic kidney disease with stage 1 through stage 4 chronic kidney disease, or unspecified chronic kidney disease: Secondary | ICD-10-CM | POA: Diagnosis not present

## 2019-09-06 DIAGNOSIS — J449 Chronic obstructive pulmonary disease, unspecified: Secondary | ICD-10-CM | POA: Diagnosis not present

## 2019-09-07 DIAGNOSIS — N183 Chronic kidney disease, stage 3 unspecified: Secondary | ICD-10-CM | POA: Diagnosis not present

## 2019-09-07 DIAGNOSIS — F0390 Unspecified dementia without behavioral disturbance: Secondary | ICD-10-CM | POA: Diagnosis not present

## 2019-09-07 DIAGNOSIS — J449 Chronic obstructive pulmonary disease, unspecified: Secondary | ICD-10-CM | POA: Diagnosis not present

## 2019-09-07 DIAGNOSIS — I129 Hypertensive chronic kidney disease with stage 1 through stage 4 chronic kidney disease, or unspecified chronic kidney disease: Secondary | ICD-10-CM | POA: Diagnosis not present

## 2019-09-07 DIAGNOSIS — M160 Bilateral primary osteoarthritis of hip: Secondary | ICD-10-CM | POA: Diagnosis not present

## 2019-09-07 DIAGNOSIS — E1122 Type 2 diabetes mellitus with diabetic chronic kidney disease: Secondary | ICD-10-CM | POA: Diagnosis not present

## 2019-09-10 DIAGNOSIS — M160 Bilateral primary osteoarthritis of hip: Secondary | ICD-10-CM | POA: Diagnosis not present

## 2019-09-10 DIAGNOSIS — E1122 Type 2 diabetes mellitus with diabetic chronic kidney disease: Secondary | ICD-10-CM | POA: Diagnosis not present

## 2019-09-10 DIAGNOSIS — N183 Chronic kidney disease, stage 3 unspecified: Secondary | ICD-10-CM | POA: Diagnosis not present

## 2019-09-10 DIAGNOSIS — F0390 Unspecified dementia without behavioral disturbance: Secondary | ICD-10-CM | POA: Diagnosis not present

## 2019-09-10 DIAGNOSIS — J449 Chronic obstructive pulmonary disease, unspecified: Secondary | ICD-10-CM | POA: Diagnosis not present

## 2019-09-10 DIAGNOSIS — I129 Hypertensive chronic kidney disease with stage 1 through stage 4 chronic kidney disease, or unspecified chronic kidney disease: Secondary | ICD-10-CM | POA: Diagnosis not present

## 2019-09-11 DIAGNOSIS — M160 Bilateral primary osteoarthritis of hip: Secondary | ICD-10-CM | POA: Diagnosis not present

## 2019-09-11 DIAGNOSIS — E1122 Type 2 diabetes mellitus with diabetic chronic kidney disease: Secondary | ICD-10-CM | POA: Diagnosis not present

## 2019-09-11 DIAGNOSIS — J449 Chronic obstructive pulmonary disease, unspecified: Secondary | ICD-10-CM | POA: Diagnosis not present

## 2019-09-11 DIAGNOSIS — I129 Hypertensive chronic kidney disease with stage 1 through stage 4 chronic kidney disease, or unspecified chronic kidney disease: Secondary | ICD-10-CM | POA: Diagnosis not present

## 2019-09-11 DIAGNOSIS — N183 Chronic kidney disease, stage 3 unspecified: Secondary | ICD-10-CM | POA: Diagnosis not present

## 2019-09-11 DIAGNOSIS — F0390 Unspecified dementia without behavioral disturbance: Secondary | ICD-10-CM | POA: Diagnosis not present

## 2019-09-12 ENCOUNTER — Other Ambulatory Visit: Payer: Self-pay | Admitting: *Deleted

## 2019-09-12 DIAGNOSIS — J449 Chronic obstructive pulmonary disease, unspecified: Secondary | ICD-10-CM | POA: Diagnosis not present

## 2019-09-12 DIAGNOSIS — N183 Chronic kidney disease, stage 3 unspecified: Secondary | ICD-10-CM | POA: Diagnosis not present

## 2019-09-12 DIAGNOSIS — E1122 Type 2 diabetes mellitus with diabetic chronic kidney disease: Secondary | ICD-10-CM | POA: Diagnosis not present

## 2019-09-12 DIAGNOSIS — F0390 Unspecified dementia without behavioral disturbance: Secondary | ICD-10-CM | POA: Diagnosis not present

## 2019-09-12 DIAGNOSIS — I129 Hypertensive chronic kidney disease with stage 1 through stage 4 chronic kidney disease, or unspecified chronic kidney disease: Secondary | ICD-10-CM | POA: Diagnosis not present

## 2019-09-12 DIAGNOSIS — M160 Bilateral primary osteoarthritis of hip: Secondary | ICD-10-CM | POA: Diagnosis not present

## 2019-09-12 NOTE — Patient Outreach (Signed)
[facility]) Care Management  09/12/2019  [name] 20-Mar-1934 [id number]   Telephone Assessment-COPD  RN attempted outreach call however unsuccessful. RN able to leave a voice message requesting a call back.Will update plan of care at that time along with pt's progress in managing her COPD.  Plan: Will rescheduled another outreach call next week.  Raina Mina, RN Care Management Coordinator [place] Office [phone]

## 2019-09-13 DIAGNOSIS — J449 Chronic obstructive pulmonary disease, unspecified: Secondary | ICD-10-CM | POA: Diagnosis not present

## 2019-09-13 DIAGNOSIS — E1122 Type 2 diabetes mellitus with diabetic chronic kidney disease: Secondary | ICD-10-CM | POA: Diagnosis not present

## 2019-09-13 DIAGNOSIS — M160 Bilateral primary osteoarthritis of hip: Secondary | ICD-10-CM | POA: Diagnosis not present

## 2019-09-13 DIAGNOSIS — I129 Hypertensive chronic kidney disease with stage 1 through stage 4 chronic kidney disease, or unspecified chronic kidney disease: Secondary | ICD-10-CM | POA: Diagnosis not present

## 2019-09-13 DIAGNOSIS — F0390 Unspecified dementia without behavioral disturbance: Secondary | ICD-10-CM | POA: Diagnosis not present

## 2019-09-13 DIAGNOSIS — N183 Chronic kidney disease, stage 3 unspecified: Secondary | ICD-10-CM | POA: Diagnosis not present

## 2019-09-18 ENCOUNTER — Other Ambulatory Visit: Payer: Self-pay | Admitting: *Deleted

## 2019-09-18 NOTE — Patient Outreach (Signed)
[facility]) Care Management  09/18/2019  TAYSIA RIVERE 04-18-1934 [id number]   Covering for assigned care manager, L. Zigmund Daniel.  Outreach attempt #2, successful to son [name].  Call placed to son to follow up on management of COPD.  He report she is improving, much better since being discharged from SNF.  She has completed OT and will have one more week with PT.  He state that there was a decision when member was initially discharged to postpone services with [facility] until the services with [facility] were complete.  Confirms that he still has contact information for RN with [facility] Helene Kelp) and he will call to set up start date.  Denies any urgent concerns at this time, confirms that member will continue with [facility] and [place] ([facility]) making monthly visits.  Will provide update to assigned care manager to follow up within the next month.  Goals Addressed              This Visit's Progress   .  Per son: Continue to get stronger and not be readmitted (pt-stated)        CARE PLAN ENTRY (see longtitudinal plan of care for additional care plan information)  Current Barriers:  Marland Kitchen Knowledge deficits related to basic understanding of COPD disease process . Knowledge deficits related to basic COPD self care/management   Case Manager Clinical Goal(s):  Over the next 28 days, patient will be able to verbalize understanding of COPD action plan and when to seek appropriate levels of medical care  Over the next 28 days, patient will verbalize basic understanding of COPD disease process and self care activities  Over the next 45 days, patient will not be hospitalized for COPD exacerbation   Interventions:   Provided patient with COPD action plan and reinforced importance of daily self assessment  Provided instruction about proper use of medications used for management of COPD including inhalers  Advised patient to self assesses COPD  action plan zone and make appointment with provider if in the yellow zone for 48 hours without improvement.  Provided education about and advised patient to utilize infection prevention strategies to reduce risk of respiratory infection   Patient Self Care Activities:  . Takes medications as prescribed including inhalers . Self assesses COPD action plan zone and makes appointment with provider if in the yellow zone for 48 hours without improvement. . Utilizes infection prevention strategies to reduce risk of respiratory infection  . Unable to perform ADLs independently  Initial goal documentation       Valente David, RN, MSN [place] [phone]

## 2019-09-25 DIAGNOSIS — J449 Chronic obstructive pulmonary disease, unspecified: Secondary | ICD-10-CM | POA: Diagnosis not present

## 2019-09-25 DIAGNOSIS — F0390 Unspecified dementia without behavioral disturbance: Secondary | ICD-10-CM | POA: Diagnosis not present

## 2019-09-25 DIAGNOSIS — E1122 Type 2 diabetes mellitus with diabetic chronic kidney disease: Secondary | ICD-10-CM | POA: Diagnosis not present

## 2019-09-25 DIAGNOSIS — M160 Bilateral primary osteoarthritis of hip: Secondary | ICD-10-CM | POA: Diagnosis not present

## 2019-09-25 DIAGNOSIS — I129 Hypertensive chronic kidney disease with stage 1 through stage 4 chronic kidney disease, or unspecified chronic kidney disease: Secondary | ICD-10-CM | POA: Diagnosis not present

## 2019-09-25 DIAGNOSIS — N183 Chronic kidney disease, stage 3 unspecified: Secondary | ICD-10-CM | POA: Diagnosis not present

## 2019-09-26 ENCOUNTER — Other Ambulatory Visit: Payer: Medicare Other | Admitting: *Deleted

## 2019-09-26 ENCOUNTER — Other Ambulatory Visit: Payer: Self-pay

## 2019-09-26 ENCOUNTER — Other Ambulatory Visit: Payer: Medicare Other

## 2019-09-26 VITALS — BP 126/100 | HR 67 | Temp 97.4°F | Resp 17

## 2019-09-26 DIAGNOSIS — Z515 Encounter for palliative care: Secondary | ICD-10-CM

## 2019-09-27 DIAGNOSIS — J449 Chronic obstructive pulmonary disease, unspecified: Secondary | ICD-10-CM | POA: Diagnosis not present

## 2019-09-27 DIAGNOSIS — E1122 Type 2 diabetes mellitus with diabetic chronic kidney disease: Secondary | ICD-10-CM | POA: Diagnosis not present

## 2019-09-27 DIAGNOSIS — F0390 Unspecified dementia without behavioral disturbance: Secondary | ICD-10-CM | POA: Diagnosis not present

## 2019-09-27 DIAGNOSIS — N183 Chronic kidney disease, stage 3 unspecified: Secondary | ICD-10-CM | POA: Diagnosis not present

## 2019-09-27 DIAGNOSIS — I129 Hypertensive chronic kidney disease with stage 1 through stage 4 chronic kidney disease, or unspecified chronic kidney disease: Secondary | ICD-10-CM | POA: Diagnosis not present

## 2019-09-27 DIAGNOSIS — M160 Bilateral primary osteoarthritis of hip: Secondary | ICD-10-CM | POA: Diagnosis not present

## 2019-10-01 NOTE — Progress Notes (Signed)
COMMUNITY PALLIATIVE CARE RN NOTE  PATIENT NAME: [name] DOB: April 25, 1934 MRN: [id number]  PRIMARY CARE PROVIDER: Shon Baton, MD  RESPONSIBLE PARTY: Dellis Filbert (son) Acct ID - Guarantor Home Phone Work Phone Relationship Acct Type  [id number] SHAQUILA, SIGMAN [phone]  Self P/F     [address], [name], [place] [address]   Covid-19 Pre-screening Negative  PLAN OF CARE and INTERVENTION:  1. ADVANCE CARE PLANNING/GOALS OF CARE: Goal is for patient to be able to ambulate independently with her walker. 2. PATIENT/CAREGIVER EDUCATION: Symptom management, safe mobility/transfers 3. DISEASE STATUS: Joint visit made with Palliative Care SW, M. Lonon. Met with patient and her son in their home. Upon arrival, she is sitting up in her recliner awake and alert. Pleasant mood and able to answer questions appropriately. She is forgetful at times. She does experience pain in her knees with standing and walking. She recently spent a week at the beach with her family. She enjoyed her time there, but her son feels that she has lost some of her strength. She had a PT session yesterday and was only able to walk up and down her hallway using her rollator walker twice. Prior to leaving, she was able to walk back and forth about 6 times. She was unable to perform exercises while standing, but did do some while sitting. Today was her last day of therapy with [facility]. Son has reached out to [facility] as he is ready to start their home health program. She has a large skin tear noted to her right lower extremity which occurred when trying to get into her daughter's SUV. She was seen by the [facility] RN yesterday. Vaseline being applied daily and area is wrapped during the night. Patient states that this area is sore. Her intake is fair. She is sleeping well during the night, but son says she sleeps very heavy and does not wake up to use the bathroom, so is incontinent mainly at night. She wears Depends and  Prevail insert. She requires assistance with ambulation, bathing and dressing. Will continue to monitor.   HISTORY OF PRESENT ILLNESS: This is a 85 yo female with a diagnosis of dementia without behavioral disturbances. She has a h/o cerebellar stroke, TIAs, DM, ataxia and generalized weakness.  Palliative care has been asked to follow patient for additional support. Will visit patient monthly and PRN.   CODE STATUS: DNR ADVANCED DIRECTIVES: Y MOST FORM: yes PPS: 40%   PHYSICAL EXAM:   VITALS: Today's Vitals   09/26/19 1454  BP: (!) 126/100  Pulse: 67  Resp: 17  Temp: (!) 97.4 F (36.3 C)  TempSrc: Temporal  SpO2: 92%  PainSc: 0-No pain    LUNGS: clear to auscultation  CARDIAC: Cor RRR EXTREMITIES: No edema SKIN: Large skin tear to right lower extremity, thin/frail skin with scattered bruising noted to arms/legs  NEURO: Alert and oriented x2, forgetful, pleasant mood, generalized weakness, ambulatory w/walker   (Duration of visit and documentation 75 minutes)   Daryl Eastern, RN BSN

## 2019-10-02 NOTE — Progress Notes (Signed)
COMMUNITY PALLIATIVE CARE SW NOTE  PATIENT NAME: [name] GENERAL DOB: 05-18-1934 MRN: [id number]  PRIMARY CARE PROVIDER: Shon Baton, MD  RESPONSIBLE PARTY:  Acct ID - Guarantor Home Phone Work Phone Relationship Acct Type  [id number] GRACIEMAE, DELISLE [phone]  Self P/F     [address], [name], [place] [address]     PLAN OF CARE and INTERVENTIONS:             1. GOALS OF CARE/ ADVANCE CARE PLANNING:  Goal is for patient to be able to ambulate independently with a walker. Patient is a DNR.  2. SOCIAL/EMOTIONAL/SPIRITUAL ASSESSMENT/ INTERVENTIONS: SW and RN-Monishia visited with patient a her home where her son-[name] was present with her. Patient was sitting in her recliner, awake, alert and pleasant. She seemed receptive to the team and was engaged, but verbalizations were delayed and she displayed some forgetfulness.  Patient denied pain, but showed the team a large skin tear on the right leg,she received when she tried to get out of the car. Patient physical therapy has ended. She will begin with [facility]. Patient is sitting more throughout the day, and have difficulty getting up and down. She ambulates with a walker. Patient is sleeping well and her intake is fair. Patient  Requires assistance with ADL's. Patient's wound is being dressed by [facility] nurse. SW provided supportive presence, assessment of needs and comfort of patient, coping and needs of PCG, and reassurance of support. SW will provide ongoing assessment of psychosocial needs and provide support.  3. PATIENT/CAREGIVER EDUCATION/ COPING:  Patient and son appear to be coping well. No issues presented.  4. PERSONAL EMERGENCY PLAN:  911 can be accessed for support.  5. COMMUNITY RESOURCES COORDINATION/ HEALTH CARE NAVIGATION:  Patient will receive services through [place] to assist with personal care needs. Patient is also receiving support through [facility].  6. FINANCIAL/LEGAL  CONCERNS/INTERVENTIONS: No financial or legal issues.      SOCIAL HX:  Social History   Tobacco Use  . Smoking status: Former Smoker    Quit date: 11/02/2004    Years since quitting: 14.9  . Smokeless tobacco: Never Used  Substance Use Topics  . Alcohol use: No    CODE STATUS: DNR ADVANCED DIRECTIVES:Yes MOST FORM COMPLETE:  Yes HOSPICE EDUCATION PROVIDED: N0  PPS: Patient ambulates with a walker but her gait is unsteady. She has some forgetful She requires assistance with ADLs.   Duration of visit and documentation 60 minutes      Katheren Puller, LCSW

## 2019-10-22 ENCOUNTER — Other Ambulatory Visit: Payer: Self-pay | Admitting: *Deleted

## 2019-10-22 DIAGNOSIS — Z1159 Encounter for screening for other viral diseases: Secondary | ICD-10-CM | POA: Diagnosis not present

## 2019-10-22 NOTE — Patient Outreach (Addendum)
[facility]) Care Management  10/22/2019  [name] [birth year] [id number]   Telephone Assessment-Successful   Spoke with primary caregiver [name]) who indicates pt is doing well. Reports current active agencies involved with pt with [facility] who is providing PT back in the home due to pt's decline with mobility along with obtaining labs today and a urine test. Palliative Care with [facility] continue to provider home visits and pt has been provided a blood pressures device along with a pulse ox for ongoing monitoring. Encouraged caregiver to utilize the [place] for documenting all vitals for the healthcare providers to view when visiting.  Plan of care discussed as caregiver reports pt's utilizes her emergency inhalers 5-7 X weekly due to her PT exercises but quickly recovers at rest. Pt continue to met some goals however continue to needs monitoring and outside resources to assist with maintenance of her management of care.  Will continue to follow up next month and update the provider accordingly on pt's ongoing progress. No other issues or needs at this time. Son very grateful for the call and feels all involved have contributed to pt's management of care.  Goals Addressed              This Visit's Progress   .  Per son: Continue to get stronger and not be readmitted (pt-stated)   On track     [place] (see longtitudinal plan of care for additional care plan information)  Current Barriers:  Marland Kitchen Knowledge deficits related to basic understanding of COPD disease process . Knowledge deficits related to basic COPD self care/management   Case Manager Clinical Goal(s):  Over the next 28 days, patient will be able to verbalize understanding of COPD action plan and when to seek appropriate levels of medical care  Over the next 28 days, patient will verbalize basic understanding of COPD disease process and self care activities  Over the next 45  days, patient will not be hospitalized for COPD exacerbation   Interventions:   Provided patient with COPD action plan and reinforced importance of daily self assessment  Provided instruction about proper use of medications used for management of COPD including inhalers  Advised patient to self assesses COPD action plan zone and make appointment with provider if in the yellow zone for 48 hours without improvement.  Provided education about and advised patient to utilize infection prevention strategies to reduce risk of respiratory infection   Patient Self Care Activities:  . Takes medications as prescribed including inhalers . Self assesses COPD action plan zone and makes appointment with provider if in the yellow zone for 48 hours without improvement. . Utilizes infection prevention strategies to reduce risk of respiratory infection  . Unable to perform ADLs independently  Initial goal documentation        Tamara Mina, RN Care Management Coordinator [place] Office [phone]

## 2019-10-23 DIAGNOSIS — N179 Acute kidney failure, unspecified: Secondary | ICD-10-CM | POA: Diagnosis not present

## 2019-10-23 DIAGNOSIS — I10 Essential (primary) hypertension: Secondary | ICD-10-CM | POA: Diagnosis not present

## 2019-10-25 ENCOUNTER — Ambulatory Visit: Payer: Medicare Other | Admitting: *Deleted

## 2019-11-05 ENCOUNTER — Telehealth: Payer: Self-pay | Admitting: *Deleted

## 2019-11-05 NOTE — Telephone Encounter (Signed)
Called and spoke with patient's son, Dellis Filbert, to schedule a palliative care home visit. Visit scheduled for 9/22@1pm .

## 2019-11-06 DIAGNOSIS — R0602 Shortness of breath: Secondary | ICD-10-CM | POA: Diagnosis not present

## 2019-11-07 DIAGNOSIS — N179 Acute kidney failure, unspecified: Secondary | ICD-10-CM | POA: Diagnosis not present

## 2019-11-07 DIAGNOSIS — D649 Anemia, unspecified: Secondary | ICD-10-CM | POA: Diagnosis not present

## 2019-11-07 DIAGNOSIS — E119 Type 2 diabetes mellitus without complications: Secondary | ICD-10-CM | POA: Diagnosis not present

## 2019-11-07 DIAGNOSIS — I10 Essential (primary) hypertension: Secondary | ICD-10-CM | POA: Diagnosis not present

## 2019-11-07 DIAGNOSIS — E559 Vitamin D deficiency, unspecified: Secondary | ICD-10-CM | POA: Diagnosis not present

## 2019-11-07 DIAGNOSIS — I69393 Ataxia following cerebral infarction: Secondary | ICD-10-CM | POA: Diagnosis not present

## 2019-11-10 ENCOUNTER — Other Ambulatory Visit
Admission: RE | Admit: 2019-11-10 | Discharge: 2019-11-10 | Disposition: A | Discharge status: Home / Self Care | Payer: Medicare Other | Source: Ambulatory Visit | Attending: Nurse Practitioner | Admitting: Nurse Practitioner

## 2019-11-10 DIAGNOSIS — E119 Type 2 diabetes mellitus without complications: Secondary | ICD-10-CM | POA: Insufficient documentation

## 2019-11-10 DIAGNOSIS — I10 Essential (primary) hypertension: Secondary | ICD-10-CM | POA: Diagnosis not present

## 2019-11-10 DIAGNOSIS — G459 Transient cerebral ischemic attack, unspecified: Secondary | ICD-10-CM | POA: Diagnosis not present

## 2019-11-10 LAB — CBC WITH DIFFERENTIAL/PLATELET
Abs Immature Granulocytes: 0.04 10*3/uL (ref 0.00–0.07)
Basophils Absolute: 0.1 10*3/uL (ref 0.0–0.1)
Basophils Relative: 1 %
Eosinophils Absolute: 0.2 10*3/uL (ref 0.0–0.5)
Eosinophils Relative: 3 %
HCT: 40 % (ref 36.0–46.0)
Hemoglobin: 13.1 g/dL (ref 12.0–15.0)
Immature Granulocytes: 1 %
Lymphocytes Relative: 29 %
Lymphs Abs: 2.4 10*3/uL (ref 0.7–4.0)
MCH: 33 pg (ref 26.0–34.0)
MCHC: 32.8 g/dL (ref 30.0–36.0)
MCV: 100.8 fL — ABNORMAL HIGH (ref 80.0–100.0)
Monocytes Absolute: 0.7 10*3/uL (ref 0.1–1.0)
Monocytes Relative: 8 %
Neutro Abs: 4.9 10*3/uL (ref 1.7–7.7)
Neutrophils Relative %: 58 %
Platelets: 266 10*3/uL (ref 150–400)
RBC: 3.97 MIL/uL (ref 3.87–5.11)
RDW: 13.3 % (ref 11.5–15.5)
WBC: 8.2 10*3/uL (ref 4.0–10.5)
nRBC: 0 % (ref 0.0–0.2)

## 2019-11-12 DIAGNOSIS — F0390 Unspecified dementia without behavioral disturbance: Secondary | ICD-10-CM | POA: Diagnosis not present

## 2019-11-12 DIAGNOSIS — N390 Urinary tract infection, site not specified: Secondary | ICD-10-CM | POA: Diagnosis not present

## 2019-11-14 ENCOUNTER — Other Ambulatory Visit: Payer: Medicare Other | Admitting: *Deleted

## 2019-11-14 ENCOUNTER — Other Ambulatory Visit: Payer: Self-pay

## 2019-11-14 VITALS — BP 137/88 | HR 60 | Temp 98.0°F | Resp 17

## 2019-11-14 DIAGNOSIS — Z515 Encounter for palliative care: Secondary | ICD-10-CM

## 2019-11-22 ENCOUNTER — Encounter: Payer: Self-pay | Admitting: *Deleted

## 2019-11-22 ENCOUNTER — Other Ambulatory Visit: Payer: Self-pay | Admitting: *Deleted

## 2019-11-22 NOTE — Patient Outreach (Signed)
[name]) Care Management  11/22/2019  [name] [birth year] [id number]   Telephone Assessment-Unsuccessful  RN attempted outreach call today however unsuccessful. RN able to leave a HIPAA approved voice message requesting a call back.  Will rescheduled another outreach call over the next week.  Raina Mina, RN Care Management Coordinator [place] Office [phone]

## 2019-11-23 NOTE — Progress Notes (Signed)
COMMUNITY PALLIATIVE CARE RN NOTE  PATIENT NAME: [name] DOB: 16-Apr-1934 MRN: [id number]  PRIMARY CARE PROVIDER: Shon Baton, MD  RESPONSIBLE PARTY:  Acct ID - Guarantor Home Phone Work Phone Relationship Acct Type  [id number] NICKIA, BOESEN [phone]  Self P/F     [address], [name], [place] [address]   Covid-19 Pre-screening Negative  PLAN OF CARE and INTERVENTION:  1. ADVANCE CARE PLANNING/GOALS OF CARE: Goal is for patient to remain in her home with son as caretaker.  2. PATIENT/CAREGIVER EDUCATION: Symptom management, safe mobility/transfers, s/s of infection 3. DISEASE STATUS: Met with patient and her son, [name], in their home. Upon arrival, patient is sitting up in her recliner awake and alert. She is very pleasant and remains able to answer most questions appropriately. She says that she has been having more issues with her body performing the task she wants it to, e.g unable to get her legs to move when she wants them to. Son reports that patient seems to be progressively weaker. She now requires assistance with standing from her chair. She is ambulatory using her rollator walker. She has been having issues with a UTI. She completed a course of antibiotics on 11/03/19. She has been been drinking more water and cranberry juice. Over the past few days she started to have a foul odor to her urine again, which is her usual symptom. A urine culture was obtained 2 days ago. Son is awaiting culture results. She is participating in physical therapy once weekly with [facility]. She was able to ambulate down the hall to her bathroom and back to her recliner 3 times. They then performed leg exercises she can do while sitting down. She requires assistance with showers. Her intake is good. She denies dysphagia and is taking her medications without difficulties. She is incontinent of bladder and wears adult briefs. The skin tear on her right lower leg has closed up. Area is dark red  but is healing nicely. Will continue to monitor.  HISTORY OF PRESENT ILLNESS: This is a 85 yo female with a diagnosis of dementia without behavioral disturbances. She has a h/o cerebellar stroke, TIAs, DM, ataxia and generalized weakness. Palliative care continues to follow patient and visits monthly and PRN.    CODE STATUS: DNR ADVANCED DIRECTIVES: Y MOST FORM: yes PPS: 40%   PHYSICAL EXAM:   VITALS: Today's Vitals   11/14/19 1337  BP: 137/88  Pulse: 60  Resp: 17  Temp: 98 F (36.7 C)  TempSrc: Temporal  SpO2: 96%  PainSc: 0-No pain    LUNGS: clear to auscultation  CARDIAC: Cor RRR EXTREMITIES: No edema SKIN: See above note: scattered bruising to arms/legs  NEURO: Alert and oriented x 3, forgetful, increased generalized weakness, ambulatory w/rollator walker   (Duration of visit and documentation 75 minutes)   Daryl Eastern, RN BSN

## 2019-11-26 DIAGNOSIS — I69393 Ataxia following cerebral infarction: Secondary | ICD-10-CM | POA: Diagnosis not present

## 2019-11-26 DIAGNOSIS — N390 Urinary tract infection, site not specified: Secondary | ICD-10-CM | POA: Diagnosis not present

## 2019-11-28 ENCOUNTER — Ambulatory Visit: Payer: Self-pay | Admitting: *Deleted

## 2019-11-29 ENCOUNTER — Other Ambulatory Visit: Payer: Self-pay | Admitting: *Deleted

## 2019-11-29 NOTE — Patient Outreach (Signed)
[facility]) Care Management  11/29/2019  [name] March 24, 1934 [id number]   Telephone Assessment-Successful-COPD  RN spoke with the pt's son who is the primary caregiver. Reports [facility] remains involved and will make a referral to [facility] for PT in the home twice week ([facility] only able to provide PT once weekly). Son reports pt having breathing issues with at rest 90% however when pt is ambulating low 80's. Pt does not meet criteria for home O2 and [facility] is assisting with home monitoring and provided pt with a home pulse ox for ongoing self monitoring. Palliative Care with [facility] remains involved and son indicated he has "quit his job" in order to assist further with his Mom's care along with his sister. Some resources were discussed with [facility] financial assistance program (involved already) and [employer] for building a ramp. Other issues that have arise Financial needs, level of care needs, building of ramp attached to the home along with supplies for urinary incontinence. Currently pending consult with a social worker with [facility] for resources. RN informed the caregiver to follow up with [facility] if unsuccessful with the requested assistance from the pending social worker consultation for other resources if available.   RN also reached out to [facility] for [facility] Providers concerning additional home visits as approved for aide services.   Plan of care review and discussed along with all goals and interventions. Verified pt's partiicapation and offered any additional resources however will allow the pending consult with a sociall worker to take place prior to intervening for additional resource to assist this time. Will follow up next month with ongoing [facility] services.  Goals Addressed              This Visit's Progress   .  COMPLETED: Per son: Continue to get stronger and not be readmitted (pt-stated)        CARE PLAN ENTRY (see  longtitudinal plan of care for additional care plan information)  Current Barriers:  Marland Kitchen Knowledge deficits related to basic understanding of COPD disease process . Knowledge deficits related to basic COPD self care/management   Case Manager Clinical Goal(s):  Over the next 28 days, patient will be able to verbalize understanding of COPD action plan and when to seek appropriate levels of medical care  Over the next 28 days, patient will verbalize basic understanding of COPD disease process and self care activities  Over the next 45 days, patient will not be hospitalized for COPD exacerbation   Interventions:   Provided patient with COPD action plan and reinforced importance of daily self assessment  Provided instruction about proper use of medications used for management of COPD including inhalers  Advised patient to self assesses COPD action plan zone and make appointment with provider if in the yellow zone for 48 hours without improvement.  Provided education about and advised patient to utilize infection prevention strategies to reduce risk of respiratory infection   Patient Self Care Activities:  . Takes medications as prescribed including inhalers . Self assesses COPD action plan zone and makes appointment with provider if in the yellow zone for 48 hours without improvement. . Utilizes infection prevention strategies to reduce risk of respiratory infection  . Unable to perform ADLs independently  Initial goal documentation RESOLVING DUE TO DUPLICATE GOALS.    Marland Kitchen  THN-Track and Manage My Symptoms        Follow Up Date 12/31/2019    - develop a rescue plan -  eliminate symptom triggers at home - follow rescue plan if symptoms flare-up - keep follow-up appointments    Why is this important?   Tracking your symptoms and other information about your health helps your doctor plan your care.  Write down the symptoms, the time of day, what you were doing and what medicine you are  taking.  You will soon learn how to manage your symptoms.     Notes:        Raina Mina, RN Care Management Coordinator [place] Office [phone]

## 2019-12-09 DIAGNOSIS — R000 Tachycardia, unspecified: Secondary | ICD-10-CM | POA: Diagnosis not present

## 2019-12-09 DIAGNOSIS — I10 Essential (primary) hypertension: Secondary | ICD-10-CM | POA: Diagnosis not present

## 2019-12-09 DIAGNOSIS — R531 Weakness: Secondary | ICD-10-CM | POA: Diagnosis not present

## 2019-12-09 DIAGNOSIS — R110 Nausea: Secondary | ICD-10-CM | POA: Diagnosis not present

## 2019-12-12 ENCOUNTER — Encounter: Payer: Self-pay | Admitting: Medical

## 2019-12-12 ENCOUNTER — Telehealth (INDEPENDENT_AMBULATORY_CARE_PROVIDER_SITE_OTHER): Payer: Medicare Other | Admitting: Medical

## 2019-12-12 VITALS — BP 137/80 | HR 67 | Ht 64.5 in | Wt 170.0 lb

## 2019-12-12 DIAGNOSIS — I10 Essential (primary) hypertension: Secondary | ICD-10-CM

## 2019-12-12 DIAGNOSIS — I422 Other hypertrophic cardiomyopathy: Secondary | ICD-10-CM | POA: Diagnosis not present

## 2019-12-12 DIAGNOSIS — E119 Type 2 diabetes mellitus without complications: Secondary | ICD-10-CM | POA: Diagnosis not present

## 2019-12-12 DIAGNOSIS — I639 Cerebral infarction, unspecified: Secondary | ICD-10-CM | POA: Diagnosis not present

## 2019-12-12 NOTE — Patient Instructions (Addendum)
Medication Instructions:  No Changes In Medications at this time.  *If you need a refill on your cardiac medications before your next appointment, please call your pharmacy*  Lab Work: None Ordered At This Time.  If you have labs (blood work) drawn today and your tests are completely normal, you will receive your results only by: Marland Kitchen MyChart Message (if you have MyChart) OR . A paper copy in the mail If you have any lab test that is abnormal or we need to change your treatment, we will call you to review the results.  Testing/Procedures: None Ordered At This Time.   Follow-Up: At [facility], you and your health needs are our priority.  As part of our continuing mission to provide you with exceptional heart care, we have created designated Provider Care Teams.  These Care Teams include your primary Cardiologist (physician) and Advanced Practice Providers (APPs -  Physician Assistants and Nurse Practitioners) who all work together to provide you with the care you need, when you need it.  Your next appointment:   6 month(s)  The format for your next appointment:   In Person  Provider:   Peter Martinique, MD

## 2019-12-12 NOTE — Progress Notes (Signed)
Virtual Visit via Telephone Note   This visit type was conducted due to national recommendations for restrictions regarding the COVID-19 Pandemic (e.g. social distancing) in an effort to limit this patient's exposure and mitigate transmission in our community.  Due to her co-morbid illnesses, this patient is at least at moderate risk for complications without adequate follow up.  This format is felt to be most appropriate for this patient at this time.  The patient did not have access to video technology/had technical difficulties with video requiring transitioning to audio format only (telephone).  All issues noted in this document were discussed and addressed.  No physical exam could be performed with this format.  Please refer to the patient's chart for her  consent to telehealth for [facility].    Date:  12/12/2019   ID:  [name], DOB 1934-12-07, MRN [id number] The patient was identified using 2 identifiers.  Patient Location: Home Provider Location: Home Office  PCP:  Shon Baton, MD  Cardiologist:  Peter Martinique, MD  Electrophysiologist:  None   Evaluation Performed:  Follow-Up Visit  Chief Complaint:  Routine follow-up  History of Present Illness:    [name] is a 85 y.o. female with a PMH of HTN, HLD. DM type 2, hypothyroidism, CVA, and dementia who presents for routine follow-up.   She was last evaluated by cardiology at an outpatient visit with Almyra Deforest, PA-C 08/2017, at which time she reported worsening DOE, though no chest pain complaints. She was recommended to undergo an echocardiogram which showed EF 60-65%, G1DD, severe basal hypertrophy with moderate concentric LVH,  no RWMA, and mild MR. This was forwarded to Dr. Martinique to review, however no further work-up was recommended. She was recommended to follow-up with Dr. Martinique in 6 months, though was lost to follow-up.  She presents today with her son for routine follow-up. She has been doing fairly well  over the past 2 years. She had a hospitalization in May 2021 for weakness/fall which resulted in a week long hospitalization followed by time at a rehab facility. She is now living at home with her son. She is on O2 via [place] qHS and prn throughout the day for her COPD. She reports stable DOE. She did have an episodes of increased SOB about a week ago with associated paleness, nausea, and loss of bowels. EMS was activated and it was felt she likely had a vagal response to sudden temperature changes as she had just gotten out of the shower. EKG was reportedly non-ischemic and she was not transported to the hospital. She has no complaints of chest pain at rest or with activity. She ambulates with a walker and, while increased unsteadiness was noted by her son, she has not had any recent falls. She denies orthopnea, PND, LE edema, dizziness, lightheadedness, or syncope. Blood pressures typically run in the 130s-140s/80s. She has palliative care home visits   The patient does not have symptoms concerning for COVID-19 infection (fever, chills, cough, or new shortness of breath).    Past Medical History:  Diagnosis Date  . Abnormality of gait   . Arthritis   . Bifascicular bundle branch block    chronic  . Degenerative spinal arthritis    cervical and lumbar  . Dementia ([place])    early onset  . Dyspnea on exertion   . GERD (gastroesophageal reflux disease)   . History of CVA (cerebrovascular accident)    03-31-2012--  right cerebellar infart without hemorrhgia  .  History of gastric ulcer    2011  w/ bleed  . History of kidney stones   . History of squamous cell carcinoma excision   . Hyperlipidemia   . Hypertension   . Hypothyroidism   . Obstructive sleep apnea    has not been using -- but states has appointment soon to get refitted   . Polymyalgia rheumatica ([place])   . Stroke ([place])   . Type 2 diabetes mellitus ([place])   . Urge urinary incontinence    refactory   Past Surgical History:    Procedure Laterality Date  . APPENDECTOMY  as child  . HERNIA REPAIR  1956  . INTERSTIM IMPLANT PLACEMENT N/A 11/19/2014   Procedure: Barrie Lyme IMPLANT FIRST STAGE AND ;  Surgeon: Bjorn Loser, MD;  [facility];  Service: Urology;  Laterality: N/A;  . INTERSTIM IMPLANT PLACEMENT N/A 11/19/2014   Procedure: Barrie Lyme IMPLANT SECOND STAGE;  Surgeon: Bjorn Loser, MD;  Location[facility];  Service: Urology;  Laterality: N/A;  . ORIF RIGHT WRIST FX  03-04-2004  . SHOULDER ARTHROSCOPY / DEBRIDEMENT/  SAD/  DCR/  MINI OPEN ROTATOR CUFF REPAIR Right 01-09-2003  . TEE WITHOUT CARDIOVERSION N/A 09/12/2012   Procedure: TRANSESOPHAGEAL ECHOCARDIOGRAM (TEE);  Surgeon: Lelon Perla, MD;  Location: [facility];  Service: Cardiovascular;  Laterality: N/A;  proximal septal thickening and chordal SAM creatin a narrow LVOT; turbulence noted  . TONSILLECTOMY  as child  . TOTAL KNEE ARTHROPLASTY Left 11-12-2004  . TRANSTHORACIC ECHOCARDIOGRAM  09-10-2012   mild LVH, mild  focal basal hypertrophy of the septum,  ef 21-30%, grade I diastolic dysfunction/  trival AR /  MV with moderate systolic anterior motion of the chordal structures/  mild LAE     Current Meds  Medication Sig  . acetaminophen (TYLENOL) 325 MG tablet Take 2 tablets (650 mg total) by mouth every 6 (six) hours as needed for mild pain or moderate pain (or Fever >/= 101).  Marland Kitchen albuterol (VENTOLIN HFA) 108 (90 Base) MCG/ACT inhaler Inhale 2 puffs into the lungs every 4 (four) hours as needed for wheezing or shortness of breath.   . cholecalciferol (VITAMIN D3) 25 MCG (1000 UNIT) tablet Take 1,000 Units by mouth daily.  . clopidogrel (PLAVIX) 75 MG tablet Take 1 tablet (75 mg total) by mouth daily with breakfast.  . donepezil (ARICEPT) 10 MG tablet Take 10 mg by mouth at bedtime.  . DULoxetine (CYMBALTA) 60 MG capsule Take 60 mg by mouth every morning.  . fluticasone furoate-vilanterol (BREO  ELLIPTA) 200-25 MCG/INH AEPB Inhale 1 puff into the lungs daily.  . folic acid (FOLVITE) 1 MG tablet Take 1 tablet (1 mg total) by mouth 2 (two) times daily.  Marland Kitchen glimepiride (AMARYL) 2 MG tablet Take 2 mg by mouth daily with breakfast.  . lamoTRIgine (LAMICTAL) 150 MG tablet Take 150 mg by mouth every morning.  Marland Kitchen levothyroxine (SYNTHROID, LEVOTHROID) 100 MCG tablet Take 1 tablet (100 mcg total) by mouth daily.  Marland Kitchen losartan (COZAAR) 50 MG tablet Take 50 mg by mouth daily.  Marland Kitchen lurasidone (LATUDA) 40 MG TABS tablet Take 40 mg by mouth every evening.   . vitamin B-12 (CYANOCOBALAMIN) 1000 MCG tablet Take 1 tablet (1,000 mcg total) by mouth daily. (Patient taking differently: Take 5,000 mcg by mouth every morning. )     Allergies:   Adhesive [tape]   Social History   Tobacco Use  . Smoking status: Former Smoker    Quit date: 11/02/2004  Years since quitting: 15.1  . Smokeless tobacco: Never Used  Substance Use Topics  . Alcohol use: No  . Drug use: No     Family Hx: The patient's family history includes Heart failure in her mother.  ROS:   Please see the history of present illness.     All other systems reviewed and are negative.   Prior CV studies:   The following studies were reviewed today:  Echocardiogram 2019: - Left ventricle: The cavity size was normal. There was severere  basal hypertrophy of the septum with otherwise moderate  concentric LVH. Systolic function was normal. The estimated  ejection fraction was in the range of 60% to 65%. Wall motion was  normal; there were no regional wall motion abnormalities. Doppler  parameters are consistent with abnormal left ventricular  relaxation (grade 1 diastolic dysfunction). Doppler parameters  are consistent with high ventricular filling pressure.  - Aortic valve: Transvalvular velocity was within the normal range.  There was no stenosis. There was no regurgitation.  - Mitral valve: Transvalvular velocity  was within the normal range.  There was no evidence for stenosis. There was mild regurgitation.  - Right ventricle: The cavity size was normal. Wall thickness was  normal. Systolic function was normal.  - Tricuspid valve: There was trivial regurgitation.  - Pulmonary arteries: Systolic pressure was within the normal  range.   Labs/Other Tests and Data Reviewed:    EKG:  An ECG dated 09/06/2017 was personally reviewed today and demonstrated:  sinus rhythm with first degree AV block, rate 91 bpm, chronic LAFB/RBBB, non-specific ST-T wave abnormalities, no significant change from previous.  Recent Labs: 07/07/2019: ALT 14; BUN 19; Creatinine, Ser 0.98; Magnesium 1.8; Potassium 4.0; Sodium 137 11/10/2019: Hemoglobin 13.1; Platelets 266   Recent Lipid Panel Lab Results  Component Value Date/Time   CHOL 122 09/09/2012 07:11 AM   TRIG 112 09/09/2012 07:11 AM   HDL 47 09/09/2012 07:11 AM   CHOLHDL 2.6 09/09/2012 07:11 AM   LDLCALC 53 09/09/2012 07:11 AM    Wt Readings from Last 3 Encounters:  12/12/19 170 lb (77.1 kg)  09/06/17 204 lb (92.5 kg)  04/23/16 199 lb 9.6 oz (90.5 kg)     Risk Assessment/Calculations:      Objective:    Vital Signs:  BP 137/80   Pulse 67   Ht 5' 4.5" (1.638 m)   Wt 170 lb (77.1 kg)   BMI 28.73 kg/m    VITAL SIGNS:  reviewed GEN:  no acute distress RESPIRATORY:  speaking in full stentency without SOB CARDIOVASCULAR:  no peripheral edema NEURO:  pleasantly demented PSYCH:  normal affect  ASSESSMENT & PLAN:     1. HCM: patient noted to have severe basal hypertrophy with moderate concentric LVH on echo in 2019. No mention of LVOT obstruction. She continues to have DOE though is relatively sedentary at baseline and overall appears stable. No complaints of chest pain, dizziness, lightheadedness, or syncope.  - Continue losartan - Could consider addition of BBlocker if BP trends upwards   2. HTN: BP 137/80 today. This is her baseline at home.   - Continue losartan  - Encourage low sodium diet  3. DM type 2: A1C 5.7 06/2019 - Continue management per PCP  4. CVA: no neurologic complaints  - Continue clopidogrel  COVID-19 Education: The signs and symptoms of COVID-19 were discussed with the patient and how to seek care for testing (follow up with PCP or arrange E-visit).  The importance of  social distancing was discussed today.  Time:   Today, I have spent 14 minutes with the patient with telehealth technology discussing the above problems.     Medication Adjustments/Labs and Tests Ordered: Current medicines are reviewed at length with the patient today.  Concerns regarding medicines are outlined above.   Tests Ordered: No orders of the defined types were placed in this encounter.   Medication Changes: No orders of the defined types were placed in this encounter.   Follow Up:  In Person with Dr. Martinique in 6 months  Signed, Abigail Butts, PA-C  12/12/2019 10:36 PM    [place]

## 2019-12-17 ENCOUNTER — Other Ambulatory Visit: Payer: Medicare Other

## 2019-12-17 DIAGNOSIS — F0390 Unspecified dementia without behavioral disturbance: Secondary | ICD-10-CM | POA: Diagnosis not present

## 2019-12-17 DIAGNOSIS — Z87440 Personal history of urinary (tract) infections: Secondary | ICD-10-CM | POA: Diagnosis not present

## 2019-12-17 DIAGNOSIS — Z7984 Long term (current) use of oral hypoglycemic drugs: Secondary | ICD-10-CM | POA: Diagnosis not present

## 2019-12-17 DIAGNOSIS — N179 Acute kidney failure, unspecified: Secondary | ICD-10-CM | POA: Diagnosis not present

## 2019-12-17 DIAGNOSIS — G4733 Obstructive sleep apnea (adult) (pediatric): Secondary | ICD-10-CM | POA: Diagnosis not present

## 2019-12-17 DIAGNOSIS — Z7951 Long term (current) use of inhaled steroids: Secondary | ICD-10-CM | POA: Diagnosis not present

## 2019-12-17 DIAGNOSIS — Z9981 Dependence on supplemental oxygen: Secondary | ICD-10-CM | POA: Diagnosis not present

## 2019-12-17 DIAGNOSIS — J449 Chronic obstructive pulmonary disease, unspecified: Secondary | ICD-10-CM | POA: Diagnosis not present

## 2019-12-17 DIAGNOSIS — Z8673 Personal history of transient ischemic attack (TIA), and cerebral infarction without residual deficits: Secondary | ICD-10-CM | POA: Diagnosis not present

## 2019-12-17 DIAGNOSIS — M160 Bilateral primary osteoarthritis of hip: Secondary | ICD-10-CM | POA: Diagnosis not present

## 2019-12-17 DIAGNOSIS — E785 Hyperlipidemia, unspecified: Secondary | ICD-10-CM | POA: Diagnosis not present

## 2019-12-17 DIAGNOSIS — I452 Bifascicular block: Secondary | ICD-10-CM | POA: Diagnosis not present

## 2019-12-17 DIAGNOSIS — E119 Type 2 diabetes mellitus without complications: Secondary | ICD-10-CM | POA: Diagnosis not present

## 2019-12-17 DIAGNOSIS — I10 Essential (primary) hypertension: Secondary | ICD-10-CM | POA: Diagnosis not present

## 2019-12-17 DIAGNOSIS — E039 Hypothyroidism, unspecified: Secondary | ICD-10-CM | POA: Diagnosis not present

## 2019-12-17 DIAGNOSIS — Z515 Encounter for palliative care: Secondary | ICD-10-CM

## 2019-12-17 DIAGNOSIS — I69393 Ataxia following cerebral infarction: Secondary | ICD-10-CM | POA: Diagnosis not present

## 2019-12-17 DIAGNOSIS — Z96652 Presence of left artificial knee joint: Secondary | ICD-10-CM | POA: Diagnosis not present

## 2019-12-17 DIAGNOSIS — M353 Polymyalgia rheumatica: Secondary | ICD-10-CM | POA: Diagnosis not present

## 2019-12-17 DIAGNOSIS — Z7902 Long term (current) use of antithrombotics/antiplatelets: Secondary | ICD-10-CM | POA: Diagnosis not present

## 2019-12-18 ENCOUNTER — Other Ambulatory Visit: Payer: Self-pay

## 2019-12-19 DIAGNOSIS — F0390 Unspecified dementia without behavioral disturbance: Secondary | ICD-10-CM | POA: Diagnosis not present

## 2019-12-19 DIAGNOSIS — E039 Hypothyroidism, unspecified: Secondary | ICD-10-CM | POA: Diagnosis not present

## 2019-12-19 DIAGNOSIS — I10 Essential (primary) hypertension: Secondary | ICD-10-CM | POA: Diagnosis not present

## 2019-12-19 DIAGNOSIS — E119 Type 2 diabetes mellitus without complications: Secondary | ICD-10-CM | POA: Diagnosis not present

## 2019-12-19 DIAGNOSIS — J449 Chronic obstructive pulmonary disease, unspecified: Secondary | ICD-10-CM | POA: Diagnosis not present

## 2019-12-19 DIAGNOSIS — I69393 Ataxia following cerebral infarction: Secondary | ICD-10-CM | POA: Diagnosis not present

## 2019-12-20 NOTE — Progress Notes (Signed)
COMMUNITY PALLIATIVE CARE SW NOTE  PATIENT NAME: [name] DOB: 1934-03-01 MRN: [id number]  PRIMARY CARE PROVIDER: Shon Baton, MD  RESPONSIBLE PARTY:  Acct ID - Guarantor Home Phone Work Phone Relationship Acct Type  [id number] [name], KE [phone]  Self P/F     [place], [name], [place] [address]   Due to the COVID-19, this visit was done via telephone from my office and it was initiated and consent by this patient and/or family.    PLAN OF CARE and INTERVENTIONS:             1. GOALS OF CARE/ ADVANCE CARE PLANNING:  Goal is for patient to be able to ambulate independently with a walker. Patient is a DNR.  2. SOCIAL/EMOTIONAL/SPIRITUAL ASSESSMENT/ INTERVENTIONS:  SW completed a telephonic visit with patient's son-[name]. [name] declined a visit this week, instead he scheduled a visit for next week. [name] advised that patient has started back physical therapy with [name] and it is tiring her out  Patient is having increased shortness of breath and is now on o2 (2.5L at night  & PRN during the day). Patient's appetite remain fair. She is still using her walker to ambulate, but is easily fatigued. She continues to require assistance with personal care needs. The [facility] nurse continues to visit for support. SW provided supportive counseling, active listening, and reassurance of support; SW will provide ongoing assessment of psychosocial needs.  3. PATIENT/CAREGIVER EDUCATION/ COPING:  Patient and son appear to be coping well.  4. PERSONAL EMERGENCY PLAN:  911 can be activated  for support. 5. COMMUNITY RESOURCES COORDINATION/ HEALTH CARE NAVIGATION:  Patient is receiving physical therapy through [facility]. 6. FINANCIAL/LEGAL CONCERNS/INTERVENTIONS:  None.     SOCIAL HX:  Social History   Tobacco Use  . Smoking status: Former Smoker    Quit date: 11/02/2004    Years since quitting: 15.1  . Smokeless tobacco: Never Used  Substance Use Topics  . Alcohol use: No     CODE STATUS: DNR  ADVANCED DIRECTIVES: Yes MOST FORM COMPLETE: Yes HOSPICE EDUCATION PROVIDED: No  PPS: Patient ambulates with a walker but her gait is unsteady. She remains forgetful Patient  requires assistance with ADLs.   Duration of visit and documentation 60 minutes       Katheren Puller, LCSW

## 2019-12-24 ENCOUNTER — Telehealth: Payer: Self-pay

## 2019-12-24 DIAGNOSIS — E119 Type 2 diabetes mellitus without complications: Secondary | ICD-10-CM | POA: Diagnosis not present

## 2019-12-24 DIAGNOSIS — F0390 Unspecified dementia without behavioral disturbance: Secondary | ICD-10-CM | POA: Diagnosis not present

## 2019-12-24 DIAGNOSIS — E039 Hypothyroidism, unspecified: Secondary | ICD-10-CM | POA: Diagnosis not present

## 2019-12-24 DIAGNOSIS — J449 Chronic obstructive pulmonary disease, unspecified: Secondary | ICD-10-CM | POA: Diagnosis not present

## 2019-12-24 DIAGNOSIS — I10 Essential (primary) hypertension: Secondary | ICD-10-CM | POA: Diagnosis not present

## 2019-12-24 DIAGNOSIS — I69393 Ataxia following cerebral infarction: Secondary | ICD-10-CM | POA: Diagnosis not present

## 2019-12-24 NOTE — Telephone Encounter (Signed)
Telephone call to patient to schedule palliative care visit with patient. Patient/family in agreement with home visit on 12/27/19 @11 :30am.

## 2019-12-25 DIAGNOSIS — J449 Chronic obstructive pulmonary disease, unspecified: Secondary | ICD-10-CM | POA: Diagnosis not present

## 2019-12-25 DIAGNOSIS — E119 Type 2 diabetes mellitus without complications: Secondary | ICD-10-CM | POA: Diagnosis not present

## 2019-12-25 DIAGNOSIS — I10 Essential (primary) hypertension: Secondary | ICD-10-CM | POA: Diagnosis not present

## 2019-12-25 DIAGNOSIS — I69393 Ataxia following cerebral infarction: Secondary | ICD-10-CM | POA: Diagnosis not present

## 2019-12-25 DIAGNOSIS — F0390 Unspecified dementia without behavioral disturbance: Secondary | ICD-10-CM | POA: Diagnosis not present

## 2019-12-25 DIAGNOSIS — E039 Hypothyroidism, unspecified: Secondary | ICD-10-CM | POA: Diagnosis not present

## 2019-12-27 ENCOUNTER — Other Ambulatory Visit: Payer: Medicare Other

## 2019-12-27 ENCOUNTER — Other Ambulatory Visit: Payer: Self-pay

## 2019-12-27 DIAGNOSIS — Z515 Encounter for palliative care: Secondary | ICD-10-CM

## 2019-12-27 DIAGNOSIS — J449 Chronic obstructive pulmonary disease, unspecified: Secondary | ICD-10-CM | POA: Diagnosis not present

## 2019-12-27 DIAGNOSIS — I10 Essential (primary) hypertension: Secondary | ICD-10-CM | POA: Diagnosis not present

## 2019-12-27 DIAGNOSIS — F0390 Unspecified dementia without behavioral disturbance: Secondary | ICD-10-CM | POA: Diagnosis not present

## 2019-12-27 DIAGNOSIS — E039 Hypothyroidism, unspecified: Secondary | ICD-10-CM | POA: Diagnosis not present

## 2019-12-27 DIAGNOSIS — E119 Type 2 diabetes mellitus without complications: Secondary | ICD-10-CM | POA: Diagnosis not present

## 2019-12-27 DIAGNOSIS — I69393 Ataxia following cerebral infarction: Secondary | ICD-10-CM | POA: Diagnosis not present

## 2019-12-28 DIAGNOSIS — I69393 Ataxia following cerebral infarction: Secondary | ICD-10-CM | POA: Diagnosis not present

## 2019-12-28 DIAGNOSIS — I10 Essential (primary) hypertension: Secondary | ICD-10-CM | POA: Diagnosis not present

## 2019-12-28 DIAGNOSIS — F0390 Unspecified dementia without behavioral disturbance: Secondary | ICD-10-CM | POA: Diagnosis not present

## 2019-12-28 DIAGNOSIS — J449 Chronic obstructive pulmonary disease, unspecified: Secondary | ICD-10-CM | POA: Diagnosis not present

## 2019-12-28 DIAGNOSIS — E119 Type 2 diabetes mellitus without complications: Secondary | ICD-10-CM | POA: Diagnosis not present

## 2019-12-28 DIAGNOSIS — E039 Hypothyroidism, unspecified: Secondary | ICD-10-CM | POA: Diagnosis not present

## 2019-12-28 NOTE — Progress Notes (Signed)
COMMUNITY PALLIATIVE CARE SW NOTE  PATIENT NAME: [name] DOB: October 20, 1934 MRN: [id number]  PRIMARY CARE PROVIDER: Shon Baton, MD  RESPONSIBLE PARTY:  Acct ID - Guarantor Home Phone Work Phone Relationship Acct Type  [id number] Tamara, Howell [phone]  Self P/F     [address], [name], [place] [phone]     PLAN OF CARE and INTERVENTIONS:             1. GOALS OF CARE/ ADVANCE CARE PLANNING:  Goal is for patient to be able to ambulate independently with a walker. Patient is a DNR. 2. SOCIAL/EMOTIONAL/SPIRITUAL ASSESSMENT/ INTERVENTIONS:  SW completed a visit with patient at her home where she was present with her son-[name].She was sitting in her recliner, awake and alert. She denied pain. Patient reported that she was feeling well overall. Patient and her son provided an update on patient's status. Patient is receiving PT through [facility] 2x's week. Patient has a bath aide 1x week. Her daughter also assist with her baths on Sunday. Patient is on o2.  2.5L at night and PRN during the day. Her son is seeking resources to have a ramp built. [name] shared that he has made several financial changes in order to relieve some financial issues they were having. Patient was engaged but is forgetful. Her affect was bright and patient appeared to be in a good mood.  SW provided supportive counseling, active listening, and reassurance of support; SW will provide ongoing assessment of psychosocial needs. 3. PATIENT/CAREGIVER EDUCATION/ COPING:  Patient and son appear to be coping well. They have a supportive family. 4. PERSONAL EMERGENCY PLAN:  911 can be activated for support. 5. COMMUNITY RESOURCES COORDINATION/ HEALTH CARE NAVIGATION: Patient continues to have physical therapy through [facility]. 6. FINANCIAL/LEGAL CONCERNS/INTERVENTIONS:  None.     SOCIAL HX:  Social History   Tobacco Use  . Smoking status: Former Smoker    Quit date: 11/02/2004    Years since quitting: 15.1  .  Smokeless tobacco: Never Used  Substance Use Topics  . Alcohol use: No    CODE STATUS: DNR ADVANCED DIRECTIVES: Yes MOST FORM COMPLETE: Yes HOSPICE EDUCATION PROVIDED: No  PPS: Patient continues to ambulate with a walker but her gait is unsteady. She remains forgetfulPatient  requires assistance with ADLs.   Duration of visit and documentation74minutes            [employer], LCSW

## 2019-12-31 ENCOUNTER — Other Ambulatory Visit: Payer: Self-pay | Admitting: *Deleted

## 2019-12-31 DIAGNOSIS — E039 Hypothyroidism, unspecified: Secondary | ICD-10-CM | POA: Diagnosis not present

## 2019-12-31 DIAGNOSIS — I10 Essential (primary) hypertension: Secondary | ICD-10-CM | POA: Diagnosis not present

## 2019-12-31 DIAGNOSIS — J449 Chronic obstructive pulmonary disease, unspecified: Secondary | ICD-10-CM | POA: Diagnosis not present

## 2019-12-31 DIAGNOSIS — I69393 Ataxia following cerebral infarction: Secondary | ICD-10-CM | POA: Diagnosis not present

## 2019-12-31 DIAGNOSIS — F0390 Unspecified dementia without behavioral disturbance: Secondary | ICD-10-CM | POA: Diagnosis not present

## 2019-12-31 DIAGNOSIS — E119 Type 2 diabetes mellitus without complications: Secondary | ICD-10-CM | POA: Diagnosis not present

## 2019-12-31 NOTE — Patient Outreach (Signed)
[name] [name]) Care Management  12/31/2019  [name] Feb 24, 1934 [id number]  Telephone Assessment-COPD  RN spoke with pt's [name]) who provided an update on pt's ongoing management of care. Report current issues related to pt's ongoing health. States he believes pt has another UTI and will get the visiting nurse from [place] to exam this week. Pt continue home O2 continuously during the night and as needed during the day currently at 2.5 liters as instructed for saturation in the 90's with use of a pulse ox check when needed (no distress). Also reports he will be scheduling pt to receive her covid vaccine booster. Reports the following agency involved with pt's ongoing care:  [facility]-Monthly visits with a nurse. Will visit this week to address possible UTI. Pt also pending a social work consult with this agency. [facility]-PT 2 X weekly and aide services X1 weekly. Recently started back due to pt's decline in her mobility.  [employer]-Agency involved to assist with building ramp. Caregiver has been asked to cover some of the [place] in contract. Palliative Care ([facility]): home has taken place and diapers were provided however to large in size. Encourage caregiver to inquired on a small size however these are donations and it is what was available at the time. States he is spending $500 monthly on diapers however pursuing several agencies to assist ($500 grant for Caregivers support program through [employer] and ongoing Education officer, museum with [employer]).   Plan of care updated accordingly as pt remains on track with assistance from her caregiver [name]). Plan discussed with goals and interventions. No other needs at this time as son very active in assisting pt with managing her ongoing care. Will follow up next month with ongoing case management services and continue to update pt's provider accordingly with her progress.   Goals  Addressed            This Visit's Progress   . THN-Track and Manage My Symptoms   On track    Follow Up Date 01/31/2020    - develop a rescue plan - eliminate symptom triggers at home - follow rescue plan if symptoms flare-up - keep follow-up appointments    Why is this important?   Tracking your symptoms and other information about your health helps your doctor plan your care.  Write down the symptoms, the time of day, what you were doing and what medicine you are taking.  You will soon learn how to manage your symptoms.     Notes: Several agencies involved with this pt's plan of care and needs. Please see narrative notes related.       Tamara Mina, RN Care Management Coordinator [place] Office [phone]

## 2020-01-02 DIAGNOSIS — I69393 Ataxia following cerebral infarction: Secondary | ICD-10-CM | POA: Diagnosis not present

## 2020-01-02 DIAGNOSIS — J449 Chronic obstructive pulmonary disease, unspecified: Secondary | ICD-10-CM | POA: Diagnosis not present

## 2020-01-02 DIAGNOSIS — E119 Type 2 diabetes mellitus without complications: Secondary | ICD-10-CM | POA: Diagnosis not present

## 2020-01-02 DIAGNOSIS — I10 Essential (primary) hypertension: Secondary | ICD-10-CM | POA: Diagnosis not present

## 2020-01-02 DIAGNOSIS — E039 Hypothyroidism, unspecified: Secondary | ICD-10-CM | POA: Diagnosis not present

## 2020-01-02 DIAGNOSIS — F0390 Unspecified dementia without behavioral disturbance: Secondary | ICD-10-CM | POA: Diagnosis not present

## 2020-01-03 DIAGNOSIS — I10 Essential (primary) hypertension: Secondary | ICD-10-CM | POA: Diagnosis not present

## 2020-01-03 DIAGNOSIS — E119 Type 2 diabetes mellitus without complications: Secondary | ICD-10-CM | POA: Diagnosis not present

## 2020-01-03 DIAGNOSIS — I69393 Ataxia following cerebral infarction: Secondary | ICD-10-CM | POA: Diagnosis not present

## 2020-01-03 DIAGNOSIS — J449 Chronic obstructive pulmonary disease, unspecified: Secondary | ICD-10-CM | POA: Diagnosis not present

## 2020-01-03 DIAGNOSIS — E039 Hypothyroidism, unspecified: Secondary | ICD-10-CM | POA: Diagnosis not present

## 2020-01-03 DIAGNOSIS — F0390 Unspecified dementia without behavioral disturbance: Secondary | ICD-10-CM | POA: Diagnosis not present

## 2020-01-09 DIAGNOSIS — E119 Type 2 diabetes mellitus without complications: Secondary | ICD-10-CM | POA: Diagnosis not present

## 2020-01-09 DIAGNOSIS — F0390 Unspecified dementia without behavioral disturbance: Secondary | ICD-10-CM | POA: Diagnosis not present

## 2020-01-09 DIAGNOSIS — I10 Essential (primary) hypertension: Secondary | ICD-10-CM | POA: Diagnosis not present

## 2020-01-09 DIAGNOSIS — I69393 Ataxia following cerebral infarction: Secondary | ICD-10-CM | POA: Diagnosis not present

## 2020-01-09 DIAGNOSIS — J449 Chronic obstructive pulmonary disease, unspecified: Secondary | ICD-10-CM | POA: Diagnosis not present

## 2020-01-09 DIAGNOSIS — E039 Hypothyroidism, unspecified: Secondary | ICD-10-CM | POA: Diagnosis not present

## 2020-01-10 DIAGNOSIS — I69393 Ataxia following cerebral infarction: Secondary | ICD-10-CM | POA: Diagnosis not present

## 2020-01-10 DIAGNOSIS — F0390 Unspecified dementia without behavioral disturbance: Secondary | ICD-10-CM | POA: Diagnosis not present

## 2020-01-10 DIAGNOSIS — E039 Hypothyroidism, unspecified: Secondary | ICD-10-CM | POA: Diagnosis not present

## 2020-01-10 DIAGNOSIS — J449 Chronic obstructive pulmonary disease, unspecified: Secondary | ICD-10-CM | POA: Diagnosis not present

## 2020-01-10 DIAGNOSIS — I10 Essential (primary) hypertension: Secondary | ICD-10-CM | POA: Diagnosis not present

## 2020-01-10 DIAGNOSIS — E119 Type 2 diabetes mellitus without complications: Secondary | ICD-10-CM | POA: Diagnosis not present

## 2020-01-11 DIAGNOSIS — J449 Chronic obstructive pulmonary disease, unspecified: Secondary | ICD-10-CM | POA: Diagnosis not present

## 2020-01-11 DIAGNOSIS — E039 Hypothyroidism, unspecified: Secondary | ICD-10-CM | POA: Diagnosis not present

## 2020-01-11 DIAGNOSIS — E119 Type 2 diabetes mellitus without complications: Secondary | ICD-10-CM | POA: Diagnosis not present

## 2020-01-11 DIAGNOSIS — F0390 Unspecified dementia without behavioral disturbance: Secondary | ICD-10-CM | POA: Diagnosis not present

## 2020-01-11 DIAGNOSIS — I69393 Ataxia following cerebral infarction: Secondary | ICD-10-CM | POA: Diagnosis not present

## 2020-01-11 DIAGNOSIS — I10 Essential (primary) hypertension: Secondary | ICD-10-CM | POA: Diagnosis not present

## 2020-01-14 DIAGNOSIS — E039 Hypothyroidism, unspecified: Secondary | ICD-10-CM | POA: Diagnosis not present

## 2020-01-14 DIAGNOSIS — I10 Essential (primary) hypertension: Secondary | ICD-10-CM | POA: Diagnosis not present

## 2020-01-14 DIAGNOSIS — I69393 Ataxia following cerebral infarction: Secondary | ICD-10-CM | POA: Diagnosis not present

## 2020-01-14 DIAGNOSIS — F0390 Unspecified dementia without behavioral disturbance: Secondary | ICD-10-CM | POA: Diagnosis not present

## 2020-01-14 DIAGNOSIS — J449 Chronic obstructive pulmonary disease, unspecified: Secondary | ICD-10-CM | POA: Diagnosis not present

## 2020-01-14 DIAGNOSIS — E119 Type 2 diabetes mellitus without complications: Secondary | ICD-10-CM | POA: Diagnosis not present

## 2020-01-16 DIAGNOSIS — N179 Acute kidney failure, unspecified: Secondary | ICD-10-CM | POA: Diagnosis not present

## 2020-01-16 DIAGNOSIS — E119 Type 2 diabetes mellitus without complications: Secondary | ICD-10-CM | POA: Diagnosis not present

## 2020-01-16 DIAGNOSIS — Z87440 Personal history of urinary (tract) infections: Secondary | ICD-10-CM | POA: Diagnosis not present

## 2020-01-16 DIAGNOSIS — F0390 Unspecified dementia without behavioral disturbance: Secondary | ICD-10-CM | POA: Diagnosis not present

## 2020-01-16 DIAGNOSIS — E785 Hyperlipidemia, unspecified: Secondary | ICD-10-CM | POA: Diagnosis not present

## 2020-01-16 DIAGNOSIS — M160 Bilateral primary osteoarthritis of hip: Secondary | ICD-10-CM | POA: Diagnosis not present

## 2020-01-16 DIAGNOSIS — Z7902 Long term (current) use of antithrombotics/antiplatelets: Secondary | ICD-10-CM | POA: Diagnosis not present

## 2020-01-16 DIAGNOSIS — Z7951 Long term (current) use of inhaled steroids: Secondary | ICD-10-CM | POA: Diagnosis not present

## 2020-01-16 DIAGNOSIS — I10 Essential (primary) hypertension: Secondary | ICD-10-CM | POA: Diagnosis not present

## 2020-01-16 DIAGNOSIS — Z9981 Dependence on supplemental oxygen: Secondary | ICD-10-CM | POA: Diagnosis not present

## 2020-01-16 DIAGNOSIS — I452 Bifascicular block: Secondary | ICD-10-CM | POA: Diagnosis not present

## 2020-01-16 DIAGNOSIS — Z8673 Personal history of transient ischemic attack (TIA), and cerebral infarction without residual deficits: Secondary | ICD-10-CM | POA: Diagnosis not present

## 2020-01-16 DIAGNOSIS — Z7984 Long term (current) use of oral hypoglycemic drugs: Secondary | ICD-10-CM | POA: Diagnosis not present

## 2020-01-16 DIAGNOSIS — J449 Chronic obstructive pulmonary disease, unspecified: Secondary | ICD-10-CM | POA: Diagnosis not present

## 2020-01-16 DIAGNOSIS — M353 Polymyalgia rheumatica: Secondary | ICD-10-CM | POA: Diagnosis not present

## 2020-01-16 DIAGNOSIS — I69393 Ataxia following cerebral infarction: Secondary | ICD-10-CM | POA: Diagnosis not present

## 2020-01-16 DIAGNOSIS — E039 Hypothyroidism, unspecified: Secondary | ICD-10-CM | POA: Diagnosis not present

## 2020-01-16 DIAGNOSIS — Z96652 Presence of left artificial knee joint: Secondary | ICD-10-CM | POA: Diagnosis not present

## 2020-01-16 DIAGNOSIS — G4733 Obstructive sleep apnea (adult) (pediatric): Secondary | ICD-10-CM | POA: Diagnosis not present

## 2020-01-21 DIAGNOSIS — I10 Essential (primary) hypertension: Secondary | ICD-10-CM | POA: Diagnosis not present

## 2020-01-21 DIAGNOSIS — F0390 Unspecified dementia without behavioral disturbance: Secondary | ICD-10-CM | POA: Diagnosis not present

## 2020-01-21 DIAGNOSIS — I69393 Ataxia following cerebral infarction: Secondary | ICD-10-CM | POA: Diagnosis not present

## 2020-01-21 DIAGNOSIS — J449 Chronic obstructive pulmonary disease, unspecified: Secondary | ICD-10-CM | POA: Diagnosis not present

## 2020-01-21 DIAGNOSIS — E119 Type 2 diabetes mellitus without complications: Secondary | ICD-10-CM | POA: Diagnosis not present

## 2020-01-21 DIAGNOSIS — E039 Hypothyroidism, unspecified: Secondary | ICD-10-CM | POA: Diagnosis not present

## 2020-01-23 DIAGNOSIS — I10 Essential (primary) hypertension: Secondary | ICD-10-CM | POA: Diagnosis not present

## 2020-01-23 DIAGNOSIS — E119 Type 2 diabetes mellitus without complications: Secondary | ICD-10-CM | POA: Diagnosis not present

## 2020-01-23 DIAGNOSIS — F0390 Unspecified dementia without behavioral disturbance: Secondary | ICD-10-CM | POA: Diagnosis not present

## 2020-01-23 DIAGNOSIS — J449 Chronic obstructive pulmonary disease, unspecified: Secondary | ICD-10-CM | POA: Diagnosis not present

## 2020-01-23 DIAGNOSIS — I69393 Ataxia following cerebral infarction: Secondary | ICD-10-CM | POA: Diagnosis not present

## 2020-01-23 DIAGNOSIS — E039 Hypothyroidism, unspecified: Secondary | ICD-10-CM | POA: Diagnosis not present

## 2020-01-24 DIAGNOSIS — J449 Chronic obstructive pulmonary disease, unspecified: Secondary | ICD-10-CM | POA: Diagnosis not present

## 2020-01-24 DIAGNOSIS — I10 Essential (primary) hypertension: Secondary | ICD-10-CM | POA: Diagnosis not present

## 2020-01-24 DIAGNOSIS — F0390 Unspecified dementia without behavioral disturbance: Secondary | ICD-10-CM | POA: Diagnosis not present

## 2020-01-24 DIAGNOSIS — E039 Hypothyroidism, unspecified: Secondary | ICD-10-CM | POA: Diagnosis not present

## 2020-01-24 DIAGNOSIS — E119 Type 2 diabetes mellitus without complications: Secondary | ICD-10-CM | POA: Diagnosis not present

## 2020-01-24 DIAGNOSIS — I69393 Ataxia following cerebral infarction: Secondary | ICD-10-CM | POA: Diagnosis not present

## 2020-01-28 DIAGNOSIS — I69393 Ataxia following cerebral infarction: Secondary | ICD-10-CM | POA: Diagnosis not present

## 2020-01-28 DIAGNOSIS — E039 Hypothyroidism, unspecified: Secondary | ICD-10-CM | POA: Diagnosis not present

## 2020-01-28 DIAGNOSIS — I10 Essential (primary) hypertension: Secondary | ICD-10-CM | POA: Diagnosis not present

## 2020-01-28 DIAGNOSIS — J449 Chronic obstructive pulmonary disease, unspecified: Secondary | ICD-10-CM | POA: Diagnosis not present

## 2020-01-28 DIAGNOSIS — E119 Type 2 diabetes mellitus without complications: Secondary | ICD-10-CM | POA: Diagnosis not present

## 2020-01-28 DIAGNOSIS — F0390 Unspecified dementia without behavioral disturbance: Secondary | ICD-10-CM | POA: Diagnosis not present

## 2020-01-29 ENCOUNTER — Other Ambulatory Visit: Payer: Medicare Other

## 2020-01-29 ENCOUNTER — Other Ambulatory Visit: Payer: Self-pay

## 2020-01-29 DIAGNOSIS — Z515 Encounter for palliative care: Secondary | ICD-10-CM

## 2020-01-30 DIAGNOSIS — I10 Essential (primary) hypertension: Secondary | ICD-10-CM | POA: Diagnosis not present

## 2020-01-30 DIAGNOSIS — F0390 Unspecified dementia without behavioral disturbance: Secondary | ICD-10-CM | POA: Diagnosis not present

## 2020-01-30 DIAGNOSIS — E039 Hypothyroidism, unspecified: Secondary | ICD-10-CM | POA: Diagnosis not present

## 2020-01-30 DIAGNOSIS — E119 Type 2 diabetes mellitus without complications: Secondary | ICD-10-CM | POA: Diagnosis not present

## 2020-01-30 DIAGNOSIS — I69393 Ataxia following cerebral infarction: Secondary | ICD-10-CM | POA: Diagnosis not present

## 2020-01-30 DIAGNOSIS — J449 Chronic obstructive pulmonary disease, unspecified: Secondary | ICD-10-CM | POA: Diagnosis not present

## 2020-01-31 ENCOUNTER — Other Ambulatory Visit: Payer: Medicare Other | Admitting: *Deleted

## 2020-01-31 ENCOUNTER — Other Ambulatory Visit: Payer: Self-pay | Admitting: *Deleted

## 2020-01-31 DIAGNOSIS — I129 Hypertensive chronic kidney disease with stage 1 through stage 4 chronic kidney disease, or unspecified chronic kidney disease: Secondary | ICD-10-CM | POA: Diagnosis not present

## 2020-01-31 DIAGNOSIS — E785 Hyperlipidemia, unspecified: Secondary | ICD-10-CM | POA: Diagnosis not present

## 2020-01-31 DIAGNOSIS — Z23 Encounter for immunization: Secondary | ICD-10-CM | POA: Diagnosis not present

## 2020-01-31 DIAGNOSIS — R454 Irritability and anger: Secondary | ICD-10-CM | POA: Diagnosis not present

## 2020-01-31 DIAGNOSIS — F3341 Major depressive disorder, recurrent, in partial remission: Secondary | ICD-10-CM | POA: Diagnosis not present

## 2020-01-31 DIAGNOSIS — N1831 Chronic kidney disease, stage 3a: Secondary | ICD-10-CM | POA: Diagnosis not present

## 2020-01-31 DIAGNOSIS — F0150 Vascular dementia without behavioral disturbance: Secondary | ICD-10-CM | POA: Diagnosis not present

## 2020-01-31 DIAGNOSIS — I422 Other hypertrophic cardiomyopathy: Secondary | ICD-10-CM | POA: Diagnosis not present

## 2020-01-31 DIAGNOSIS — E1140 Type 2 diabetes mellitus with diabetic neuropathy, unspecified: Secondary | ICD-10-CM | POA: Diagnosis not present

## 2020-01-31 DIAGNOSIS — I517 Cardiomegaly: Secondary | ICD-10-CM | POA: Diagnosis not present

## 2020-01-31 DIAGNOSIS — E039 Hypothyroidism, unspecified: Secondary | ICD-10-CM | POA: Diagnosis not present

## 2020-01-31 DIAGNOSIS — J449 Chronic obstructive pulmonary disease, unspecified: Secondary | ICD-10-CM | POA: Diagnosis not present

## 2020-01-31 NOTE — Progress Notes (Signed)
COMMUNITY PALLIATIVE CARE SW NOTE  PATIENT NAME: [name] DOB: 05-13-1934 MRN: [id number]  PRIMARY CARE PROVIDER: Shon Baton, MD  RESPONSIBLE PARTY:  Acct ID - Guarantor Home Phone Work Phone Relationship Acct Type  [id number] ANISHA, STARLIPER [phone]  Self P/F     [place], [name], [place] [address]     PLAN OF CARE and INTERVENTIONS:             1. GOALS OF CARE/ ADVANCE CARE PLANNING:  Goal is for patient to be able to ambulate independently with a walker. Patient is a DNR. 1.  1. SOCIAL/EMOTIONAL/SPIRITUAL ASSESSMENT/ INTERVENTIONS:  SW completed a telephonic visit with patient's son-Jeff. He declined a visit at this time due to scheduling. [name] provided an update on patient. He stated that patient is stably declining. She has a doctor's appointment on Thursday with Dr. Virgina Jock. [name] states that he see patient's vascular dementia getting worst where she is getting more forgetful, tired, weak and depressed. She continues to receive therapy through [facility]. The therapist shared concerns with [name] that patient neurological functioning was declining and more profound. Patient does not want to go get up. [name] is having to encourage her get up and walk to the bathroom. She is having more incontinence. [name] feels that patient's depression around family issue is contributing to her overall decline. [name] will discuss this decline with patient during the appointment. SW offered to find a Social worker for patient. [name] advised that he wanted to discuss the changes with patient's doctor. SW provided supportive counseling, active listening, and reassurance of support; SW will provide ongoing assessment of psychosocial needs and provide support as needed.  PATIENT/CAREGIVER EDUCATION/ COPING:  Patient and son appear to be coping well. They have a supportive family. 2.  3. PERSONAL EMERGENCY PLAN:  911 can be activated for emergencies. 1. COMMUNITY RESOURCES COORDINATION/ HEALTH CARE  NAVIGATION:  Patient continues to have physical therapy through [facility]. 4.  5. FINANCIAL/LEGAL CONCERNS/INTERVENTIONS:  None.  SOCIAL HX:  Social History   Tobacco Use  . Smoking status: Former Smoker    Quit date: 11/02/2004    Years since quitting: 15.2  . Smokeless tobacco: Never Used  Substance Use Topics  . Alcohol use: No    CODE STATUS: DNR ADVANCED DIRECTIVES: Yes MOST FORM COMPLETE:  Yes HOSPICE EDUCATION PROVIDED: No  PPS: Patient is having decreased ambulation with a walker but her gait is unsteady. Sheis having increased forgetfulness and confusion.Patientrequires assistance with ADLs.   Duration of telephonic visit and documentation30 minutes      Katheren Puller, LCSW

## 2020-01-31 NOTE — Patient Outreach (Signed)
[place] [facility]) Care Management  01/31/2020  CONSUELLO LASSALLE 26-Oct-1934 [id number]   Telephone Assessment  Awaiting a return call. Pt currently at her provider's office (Dr. Virgina Jock). Spoke with son [name]) who will return the call at a later time.  Will await the call back to receive an update on pt's ongoing management of care.  Raina Mina, RN Care Management Coordinator [place] Office [phone]

## 2020-01-31 NOTE — Patient Outreach (Signed)
[place] [facility]) Care Management  01/31/2020  RANIA PROTHERO 08/30/1934 [id number]   Telephone Assessment-Successful-COPD  RN spoke with the pt's son Dellis Filbert) with an update on pt's ongoing management of care. Reports pt had an appointment today with Dr. Virgina Jock who has ordered neurology testing concerning possible Parkinson's. States pt will continue to work with [facility] with PT (therapist-Rob) services however needs to renew services and Dr. Virgina Jock has prescribed additional home visits with PT services. States this is necessary due to pt's vascular dementia has slowed pt's process with ambulation. Reports no falls or injuries related just slow progress. Confirmed attendance to all medical appointments and pt adheres to all her medications with no needed refills. Other updates on involved services related to [employer] worker has made a referral for a ramp to be built through [employer] however this will be after [employer]. Caregiver will follow up with [employer] and confirm this disposition. RN inquired on other services that were involved ([facility]). Son indicated he would call due to a month has past with no contact with the company.   Plan of care review and discussed with no acute issues. States pt has not use her emergence inhalers with no acute episodes and pt remains in the GREEN zone and continue to adhere to her daily prescriptions. Pt continue 2.5 liters of home O2 ([employer]) with no acute events. All goals discussed along along with interventions and RN encouraged use of the tools send to assist with pt's ongoing daily management of care. Pt remains on track with notation and RN case manager will continue to encouraged adherence with this plan as pt continues to progress with ongoing PT therapy. Will follow up next month with pt's caregiver son [name]) and update provider on pt's ongoing disposition with [facility] services.  Addendum @ 5427: The  son [name]) called RN case manager with an update. States he called [facility] and confirmed the next home visit will be next Friday for ongoing services.   Goals Addressed            This Visit's Progress    THN-Track and Manage My Symptoms   On track    Follow Up Date 03/24/2020   - develop a rescue plan - eliminate symptom triggers at home - follow rescue plan if symptoms flare-up - keep follow-up appointments    Why is this important?   Tracking your symptoms and other information about your health helps your doctor plan your care.  Write down the symptoms, the time of day, what you were doing and what medicine you are taking.  You will soon learn how to manage your symptoms.     Notes: [facility] remains involved with PT services. Home O2 with [employer]. Will extend to allow ongoing adherence with pt's management of care. Verified no acute symptoms over the past month with pt's COPD.       Raina Mina, RN Care Management Coordinator [place] Office [phone]

## 2020-02-01 NOTE — Progress Notes (Signed)
Assessment/Plan:    1.  Parkinsonism  -I had a long counseling session with the patient today.  I discussed with the patient that she likely has secondary parkinsonism due to latuda.  I explained that one clinically cannot tell the difference between idiopathic parkinsons disease and secondary parkinsonism from medication.  I also explained that even if one is able to get off of the medication, it can take up to 6 months to clinically definitively know if this is idiopathic parkinsons disease.  I did not advise that the patient go off of medication, as this needs to be discussed with the patients prescribing physician.  I did, however, tell the patient that the longer one is on the medication, the worse the symptoms can get.  The patient is to make an appointment with primary care to discuss what I have discussed with her.  Discussed that they may need a referral back to psychiatry, as apparently the patient was very difficult to deal with prior to the addition of Latuda by psychiatry.  They are going to talk to Dr. Virgina Jock about that.   Subjective:   [name] was seen today in the movement disorders clinic for neurologic consultation at the request of Shon Baton, MD.  The consultation is for the evaluation of Parkinsons Disease.  Outside records that were made available to me were reviewed.  Pt with son who supplements hx.  Patient is an 85 year old female with a history of type 2 diabetes, vascular dementia with behavioral changes (irritability/anger), COPD, chronic kidney disease who presents today with tremor and gait change suggestive of Parkinson's disease.  She was sent here for further evaluation.  Patient is enrolled in palliative care.   Specific Symptoms:  Tremor: Yes.  , R>L hand x 1 year Family hx of similar:  Brother with similar sx's Voice: no change in strength Sleep: trouble falling asleep but better with O2  Vivid Dreams:  No.  Acting out dreams:  No. Wet Pillows: Yes.    Postural symptoms:  Yes.    Falls?  Yes.   , 3 x in the last year.  Had near fall on way here on way down stairs but son caught her.   Bradykinesia symptoms: shuffling gait, slow movements and difficulty regaining balance Loss of smell:  Yes.   Loss of taste:  Yes.   Urinary Incontinence:  Yes.   Difficulty Swallowing:  No. Trouble with ADL's:  Yes.    Trouble buttoning clothing: Yes.   Depression:  No. but admits to anxiety Memory changes:  Yes.   (short term; lives with son x 10 years - initially it was financial decision for son but for last 5-6 years it was b/c pt needed caregiving and last 1 year it is a needed 24 hour caregiving) Hallucinations:  No.  visual distortions: No. N/V:  No. Lightheaded:  Yes.   Diplopia:  No. Dyskinesia:  No. Prior exposure to reglan/antipsychotics: Yes.  , on latuda x 3 years (cousin is psychiatrist in [facility] and prescribed it initially)  Last neuroimaging of the brain was in 2016 and that was a CT.  She had an MRI of the brain in 2014.  MRI of the brain in 2014 was due to acute infarcts in the right superior frontal gyrus, right MCA territory and noted chronic right cerebellar infarct.  Most recent imaging of the brain from CT head in 2016 was personally reviewed.  There was significant small vessel disease and significant atrophy.  ALLERGIES:   Allergies  Allergen Reactions  . Adhesive [Tape] Other (See Comments)    Irritation and pulls off skin    CURRENT MEDICATIONS:  Current Outpatient Medications  Medication Instructions  . acetaminophen (TYLENOL) 650 mg, Oral, Every 6 hours PRN  . albuterol (VENTOLIN HFA) 108 (90 Base) MCG/ACT inhaler 2 puffs, Inhalation, Every 4 hours PRN  . cholecalciferol (VITAMIN D3) 1,000 Units, Oral, Daily  . clopidogrel (PLAVIX) 75 mg, Oral, Daily with breakfast  . donepezil (ARICEPT) 10 mg, Oral, Daily at bedtime  . DULoxetine (CYMBALTA) 60 mg, Oral,  Every morning - 10a  . fluticasone furoate-vilanterol (BREO  ELLIPTA) 200-25 MCG/INH AEPB 1 puff, Inhalation, Daily  . folic acid (FOLVITE) 1 mg, Oral, 2 times daily  . glimepiride (AMARYL) 2 mg, Oral, Every other day  . lamoTRIgine (LAMICTAL) 150 mg, Oral,  Every morning - 10a  . levothyroxine (SYNTHROID) 100 mcg, Oral, Daily  . losartan (COZAAR) 50 mg, Oral, Daily, Take 1 tablet 5 days a week then other 2 days take mg daily  . lurasidone (LATUDA) 40 mg, Oral, Every evening  . vitamin B-12 (CYANOCOBALAMIN) 1,000 mcg, Oral, Daily    Objective:   VITALS:   Vitals:   02/05/20 1330  BP: 110/78  Pulse: 72  SpO2: 96%  Weight: 179 lb (81.2 kg)  Height: 5\' 4"  (1.626 m)    GEN:  The patient appears stated age and is in NAD. HEENT:  Normocephalic, atraumatic.  The mucous membranes are moist. The superficial temporal arteries are without ropiness or tenderness. CV:  RRR Lungs:  CTAB Neck/HEME:  There are no carotid bruits bilaterally.  Neurological examination:  Orientation: The patient is alert and oriented x3 but looks to her son for finer aspects of the history.  Cranial nerves: There is good facial symmetry.  There is facial hypomimia.  Extraocular muscles are intact. The visual fields are full to confrontational testing. The speech is fluent and clear. Soft palate rises symmetrically and there is no tongue deviation. Hearing is intact to conversational tone. Sensation: Sensation is intact to light and pinprick throughout (facial, trunk, extremities). Vibration is intact at the bilateral big toe. There is no extinction with double simultaneous stimulation. There is no sensory dermatomal level identified. Motor: Strength is 5/5 in the bilateral upper and lower extremities.   Shoulder shrug is equal and symmetric.  There is no pronator drift. Deep tendon reflexes: Deep tendon reflexes are 2/4 at the bilateral biceps, triceps, brachioradialis, patella and achilles. Plantar responses are downgoing bilaterally.  Movement examination: Tone: There is  normal tone in the bilateral upper extremities.  There is mild increased tone in the right lower extremity. Abnormal movements: There is intermittent right upper extremity rest tremor Coordination:  There is  decremation with RAM's, with any form of RAMS, including alternating supination and pronation of the forearm, hand opening and closing, finger taps, heel taps and toe taps, right greater than left Gait and Station: The patient needs assistance out of the chair.  She is given a walker.  She shuffles.  She occasionally freezes.  She has start hesitation.  She turns en bloc.   I have reviewed and interpreted the following labs independently   Chemistry      Component Value Date/Time   NA 137 07/07/2019 0607   K 4.0 07/07/2019 0607   CL 103 07/07/2019 0607   CO2 23 07/07/2019 0607   BUN 19 07/07/2019 0607   CREATININE 0.98 07/07/2019 4818  Component Value Date/Time   CALCIUM 9.5 07/07/2019 0607   ALKPHOS 47 07/07/2019 0607   AST 16 07/07/2019 0607   ALT 14 07/07/2019 0607   BILITOT 1.0 07/07/2019 0607      Lab Results  Component Value Date   TSH 1.379 03/31/2012   Lab Results  Component Value Date   WBC 8.2 11/10/2019   HGB 13.1 11/10/2019   HCT 40.0 11/10/2019   MCV 100.8 (H) 11/10/2019   PLT 266 11/10/2019     Total time spent on today's visit was 60 minutes, including both face-to-face time and nonface-to-face time.  Time included that spent on review of records (prior notes available to me/labs/imaging if pertinent), discussing treatment and goals, answering patient's questions and coordinating care.  Cc:  Shon Baton, MD

## 2020-02-04 DIAGNOSIS — I10 Essential (primary) hypertension: Secondary | ICD-10-CM | POA: Diagnosis not present

## 2020-02-04 DIAGNOSIS — J449 Chronic obstructive pulmonary disease, unspecified: Secondary | ICD-10-CM | POA: Diagnosis not present

## 2020-02-04 DIAGNOSIS — I69393 Ataxia following cerebral infarction: Secondary | ICD-10-CM | POA: Diagnosis not present

## 2020-02-04 DIAGNOSIS — E039 Hypothyroidism, unspecified: Secondary | ICD-10-CM | POA: Diagnosis not present

## 2020-02-04 DIAGNOSIS — E119 Type 2 diabetes mellitus without complications: Secondary | ICD-10-CM | POA: Diagnosis not present

## 2020-02-04 DIAGNOSIS — F0390 Unspecified dementia without behavioral disturbance: Secondary | ICD-10-CM | POA: Diagnosis not present

## 2020-02-05 ENCOUNTER — Other Ambulatory Visit: Payer: Self-pay

## 2020-02-05 ENCOUNTER — Ambulatory Visit (INDEPENDENT_AMBULATORY_CARE_PROVIDER_SITE_OTHER): Payer: Medicare Other | Admitting: Neurology

## 2020-02-05 ENCOUNTER — Encounter: Payer: Self-pay | Admitting: Neurology

## 2020-02-05 VITALS — BP 110/78 | HR 72 | Ht 64.0 in | Wt 179.0 lb

## 2020-02-05 DIAGNOSIS — I639 Cerebral infarction, unspecified: Secondary | ICD-10-CM | POA: Diagnosis not present

## 2020-02-05 DIAGNOSIS — G2111 Neuroleptic induced parkinsonism: Secondary | ICD-10-CM

## 2020-02-05 NOTE — Patient Instructions (Signed)
Call Dr. Virgina Jock and I will send him a note as well.  Get back to psychiatry to discuss what we have talked about.  IF you are able to change Latuda, it could take up to 6 months to reverse symptoms.  If you are not able to change Latuda then I may need to see you back.  The physicians and staff at [facility] are committed to providing excellent care. You may receive a survey requesting feedback about your experience at our office. We strive to receive "very good" responses to the survey questions. If you feel that your experience would prevent you from giving the office a "very good " response, please contact our office to try to remedy the situation. We may be reached at [phone]. Thank you for taking the time out of your busy day to complete the survey.

## 2020-02-06 DIAGNOSIS — M1611 Unilateral primary osteoarthritis, right hip: Secondary | ICD-10-CM | POA: Diagnosis not present

## 2020-02-06 DIAGNOSIS — Z043 Encounter for examination and observation following other accident: Secondary | ICD-10-CM | POA: Diagnosis not present

## 2020-02-06 DIAGNOSIS — Z96652 Presence of left artificial knee joint: Secondary | ICD-10-CM | POA: Diagnosis not present

## 2020-02-06 DIAGNOSIS — M1612 Unilateral primary osteoarthritis, left hip: Secondary | ICD-10-CM | POA: Diagnosis not present

## 2020-02-06 DIAGNOSIS — M19072 Primary osteoarthritis, left ankle and foot: Secondary | ICD-10-CM | POA: Diagnosis not present

## 2020-02-08 DIAGNOSIS — I69393 Ataxia following cerebral infarction: Secondary | ICD-10-CM | POA: Diagnosis not present

## 2020-02-08 DIAGNOSIS — J449 Chronic obstructive pulmonary disease, unspecified: Secondary | ICD-10-CM | POA: Diagnosis not present

## 2020-02-08 DIAGNOSIS — E119 Type 2 diabetes mellitus without complications: Secondary | ICD-10-CM | POA: Diagnosis not present

## 2020-02-08 DIAGNOSIS — I10 Essential (primary) hypertension: Secondary | ICD-10-CM | POA: Diagnosis not present

## 2020-02-08 DIAGNOSIS — E039 Hypothyroidism, unspecified: Secondary | ICD-10-CM | POA: Diagnosis not present

## 2020-02-08 DIAGNOSIS — F0390 Unspecified dementia without behavioral disturbance: Secondary | ICD-10-CM | POA: Diagnosis not present

## 2020-02-09 DIAGNOSIS — R918 Other nonspecific abnormal finding of lung field: Secondary | ICD-10-CM | POA: Diagnosis not present

## 2020-02-11 DIAGNOSIS — I10 Essential (primary) hypertension: Secondary | ICD-10-CM | POA: Diagnosis not present

## 2020-02-11 DIAGNOSIS — E119 Type 2 diabetes mellitus without complications: Secondary | ICD-10-CM | POA: Diagnosis not present

## 2020-02-11 DIAGNOSIS — J449 Chronic obstructive pulmonary disease, unspecified: Secondary | ICD-10-CM | POA: Diagnosis not present

## 2020-02-11 DIAGNOSIS — E039 Hypothyroidism, unspecified: Secondary | ICD-10-CM | POA: Diagnosis not present

## 2020-02-11 DIAGNOSIS — I69393 Ataxia following cerebral infarction: Secondary | ICD-10-CM | POA: Diagnosis not present

## 2020-02-11 DIAGNOSIS — F0390 Unspecified dementia without behavioral disturbance: Secondary | ICD-10-CM | POA: Diagnosis not present

## 2020-02-13 DIAGNOSIS — I10 Essential (primary) hypertension: Secondary | ICD-10-CM | POA: Diagnosis not present

## 2020-02-13 DIAGNOSIS — J449 Chronic obstructive pulmonary disease, unspecified: Secondary | ICD-10-CM | POA: Diagnosis not present

## 2020-02-13 DIAGNOSIS — E119 Type 2 diabetes mellitus without complications: Secondary | ICD-10-CM | POA: Diagnosis not present

## 2020-02-13 DIAGNOSIS — I69393 Ataxia following cerebral infarction: Secondary | ICD-10-CM | POA: Diagnosis not present

## 2020-02-13 DIAGNOSIS — F0390 Unspecified dementia without behavioral disturbance: Secondary | ICD-10-CM | POA: Diagnosis not present

## 2020-02-13 DIAGNOSIS — E039 Hypothyroidism, unspecified: Secondary | ICD-10-CM | POA: Diagnosis not present

## 2020-02-15 DIAGNOSIS — M160 Bilateral primary osteoarthritis of hip: Secondary | ICD-10-CM | POA: Diagnosis not present

## 2020-02-15 DIAGNOSIS — E119 Type 2 diabetes mellitus without complications: Secondary | ICD-10-CM | POA: Diagnosis not present

## 2020-02-15 DIAGNOSIS — Z7951 Long term (current) use of inhaled steroids: Secondary | ICD-10-CM | POA: Diagnosis not present

## 2020-02-15 DIAGNOSIS — I452 Bifascicular block: Secondary | ICD-10-CM | POA: Diagnosis not present

## 2020-02-15 DIAGNOSIS — J449 Chronic obstructive pulmonary disease, unspecified: Secondary | ICD-10-CM | POA: Diagnosis not present

## 2020-02-15 DIAGNOSIS — Z96652 Presence of left artificial knee joint: Secondary | ICD-10-CM | POA: Diagnosis not present

## 2020-02-15 DIAGNOSIS — Z7902 Long term (current) use of antithrombotics/antiplatelets: Secondary | ICD-10-CM | POA: Diagnosis not present

## 2020-02-15 DIAGNOSIS — Z7984 Long term (current) use of oral hypoglycemic drugs: Secondary | ICD-10-CM | POA: Diagnosis not present

## 2020-02-15 DIAGNOSIS — F0390 Unspecified dementia without behavioral disturbance: Secondary | ICD-10-CM | POA: Diagnosis not present

## 2020-02-15 DIAGNOSIS — I69393 Ataxia following cerebral infarction: Secondary | ICD-10-CM | POA: Diagnosis not present

## 2020-02-15 DIAGNOSIS — I10 Essential (primary) hypertension: Secondary | ICD-10-CM | POA: Diagnosis not present

## 2020-02-15 DIAGNOSIS — M353 Polymyalgia rheumatica: Secondary | ICD-10-CM | POA: Diagnosis not present

## 2020-02-15 DIAGNOSIS — Z9981 Dependence on supplemental oxygen: Secondary | ICD-10-CM | POA: Diagnosis not present

## 2020-02-15 DIAGNOSIS — E039 Hypothyroidism, unspecified: Secondary | ICD-10-CM | POA: Diagnosis not present

## 2020-02-15 DIAGNOSIS — N179 Acute kidney failure, unspecified: Secondary | ICD-10-CM | POA: Diagnosis not present

## 2020-02-15 DIAGNOSIS — Z9181 History of falling: Secondary | ICD-10-CM | POA: Diagnosis not present

## 2020-02-15 DIAGNOSIS — E785 Hyperlipidemia, unspecified: Secondary | ICD-10-CM | POA: Diagnosis not present

## 2020-02-15 DIAGNOSIS — G4733 Obstructive sleep apnea (adult) (pediatric): Secondary | ICD-10-CM | POA: Diagnosis not present

## 2020-02-15 DIAGNOSIS — Z87440 Personal history of urinary (tract) infections: Secondary | ICD-10-CM | POA: Diagnosis not present

## 2020-02-15 DIAGNOSIS — Z8673 Personal history of transient ischemic attack (TIA), and cerebral infarction without residual deficits: Secondary | ICD-10-CM | POA: Diagnosis not present

## 2020-02-20 DIAGNOSIS — E039 Hypothyroidism, unspecified: Secondary | ICD-10-CM | POA: Diagnosis not present

## 2020-02-20 DIAGNOSIS — I10 Essential (primary) hypertension: Secondary | ICD-10-CM | POA: Diagnosis not present

## 2020-02-20 DIAGNOSIS — I69393 Ataxia following cerebral infarction: Secondary | ICD-10-CM | POA: Diagnosis not present

## 2020-02-20 DIAGNOSIS — F0390 Unspecified dementia without behavioral disturbance: Secondary | ICD-10-CM | POA: Diagnosis not present

## 2020-02-20 DIAGNOSIS — E119 Type 2 diabetes mellitus without complications: Secondary | ICD-10-CM | POA: Diagnosis not present

## 2020-02-20 DIAGNOSIS — J449 Chronic obstructive pulmonary disease, unspecified: Secondary | ICD-10-CM | POA: Diagnosis not present

## 2020-02-22 DIAGNOSIS — F0390 Unspecified dementia without behavioral disturbance: Secondary | ICD-10-CM | POA: Diagnosis not present

## 2020-02-22 DIAGNOSIS — J449 Chronic obstructive pulmonary disease, unspecified: Secondary | ICD-10-CM | POA: Diagnosis not present

## 2020-02-22 DIAGNOSIS — E119 Type 2 diabetes mellitus without complications: Secondary | ICD-10-CM | POA: Diagnosis not present

## 2020-02-22 DIAGNOSIS — I10 Essential (primary) hypertension: Secondary | ICD-10-CM | POA: Diagnosis not present

## 2020-02-22 DIAGNOSIS — E039 Hypothyroidism, unspecified: Secondary | ICD-10-CM | POA: Diagnosis not present

## 2020-02-22 DIAGNOSIS — I69393 Ataxia following cerebral infarction: Secondary | ICD-10-CM | POA: Diagnosis not present

## 2020-02-27 DIAGNOSIS — E039 Hypothyroidism, unspecified: Secondary | ICD-10-CM | POA: Diagnosis not present

## 2020-02-27 DIAGNOSIS — I69393 Ataxia following cerebral infarction: Secondary | ICD-10-CM | POA: Diagnosis not present

## 2020-02-27 DIAGNOSIS — I10 Essential (primary) hypertension: Secondary | ICD-10-CM | POA: Diagnosis not present

## 2020-02-27 DIAGNOSIS — F0390 Unspecified dementia without behavioral disturbance: Secondary | ICD-10-CM | POA: Diagnosis not present

## 2020-02-27 DIAGNOSIS — J449 Chronic obstructive pulmonary disease, unspecified: Secondary | ICD-10-CM | POA: Diagnosis not present

## 2020-02-27 DIAGNOSIS — E119 Type 2 diabetes mellitus without complications: Secondary | ICD-10-CM | POA: Diagnosis not present

## 2020-02-28 DIAGNOSIS — E538 Deficiency of other specified B group vitamins: Secondary | ICD-10-CM | POA: Diagnosis not present

## 2020-02-29 ENCOUNTER — Other Ambulatory Visit: Payer: Self-pay | Admitting: *Deleted

## 2020-02-29 DIAGNOSIS — E1140 Type 2 diabetes mellitus with diabetic neuropathy, unspecified: Secondary | ICD-10-CM | POA: Diagnosis not present

## 2020-02-29 DIAGNOSIS — E559 Vitamin D deficiency, unspecified: Secondary | ICD-10-CM | POA: Diagnosis not present

## 2020-02-29 DIAGNOSIS — E039 Hypothyroidism, unspecified: Secondary | ICD-10-CM | POA: Diagnosis not present

## 2020-02-29 DIAGNOSIS — E785 Hyperlipidemia, unspecified: Secondary | ICD-10-CM | POA: Diagnosis not present

## 2020-02-29 NOTE — Patient Outreach (Signed)
[place] [facility]) Care Management  02/29/2020  [name] 05-31-1934 [id number]   Telephone Assessment-Unsuccessful  RN attempted outreach unsuccessful. RN able to leave a HIPAA approved voice message requesting a call back.  Will follow up once again within the next week for ongoing [facility] services.  Raina Mina, RN Care Management Coordinator [place] Office [phone]

## 2020-03-03 DIAGNOSIS — I69393 Ataxia following cerebral infarction: Secondary | ICD-10-CM | POA: Diagnosis not present

## 2020-03-03 DIAGNOSIS — F0390 Unspecified dementia without behavioral disturbance: Secondary | ICD-10-CM | POA: Diagnosis not present

## 2020-03-03 DIAGNOSIS — J449 Chronic obstructive pulmonary disease, unspecified: Secondary | ICD-10-CM | POA: Diagnosis not present

## 2020-03-03 DIAGNOSIS — E119 Type 2 diabetes mellitus without complications: Secondary | ICD-10-CM | POA: Diagnosis not present

## 2020-03-03 DIAGNOSIS — E039 Hypothyroidism, unspecified: Secondary | ICD-10-CM | POA: Diagnosis not present

## 2020-03-03 DIAGNOSIS — I10 Essential (primary) hypertension: Secondary | ICD-10-CM | POA: Diagnosis not present

## 2020-03-04 ENCOUNTER — Other Ambulatory Visit: Payer: Self-pay | Admitting: *Deleted

## 2020-03-04 NOTE — Patient Outreach (Signed)
[place] [facility]) Care Management  03/04/2020  [name] 09/04/1934 [id number]   Telephone Assessment-Successful- COPD  Outreach #2 RN spoke with son [name] who reports pt's is doing well with no acute issues or events. States the following agencies remain active with this pt's. [facility]-PT Tuesday and Thursday (2X weekly) [name]-PT Monday and Wednesday (2X weekly) [facility] (Palliative)-pending a home visit this month  COPD-Caregiver reports pt's is doing well with her COPD. States pt's provider increased the pt's home O2 to 3 liters at night and using during the day as needed. States the provider wants pt to use her inhalers less with this intervention of more liters of O2. Plan of care review and discussed with updates as pt remains on track with her ongoing management of care with the assistance of her son [name]). Goals and interventions discussed as RN continued to encourage ongoing adherence to the current plan of care.  CHF- Pt very active with the two involved agencies and remains steady with her weights with no reported fluid retention and no symptoms exhibited by the pt since the last conversation with this RN case manager.  Reports pt's has a good appetite and continues to do "well". Reports current issues with awaiting the [employer] to build the outdoor ramp at the door entry level for easier access. Caregiver will follow up on this pending process. Also awaiting a pending appointment with pt's phycologist for possible medication change related to pt's response to Paikinsonism medication (Latuda). Pt will follow up with Dr. Virgina Jock (primary) this week 03/06/2020.   Will follow up next month with ongoing case management and continue to encouraged adherence to the discussed plan of care and update provider quarterly on pt's disposition with [facility] services.   Goals Addressed            This Visit's Progress   . THN-Track and  Manage My Symptoms   On track    Follow Up Date 04/04/2009 Timeframe:  Long-Range Goal Priority:  Medium Start Date:     03/04/2020                        Expected End Date:    06/20/2020                     - develop a rescue plan - eliminate symptom triggers at home - follow rescue plan if symptoms flare-up - keep follow-up appointments    Why is this important?   Tracking your symptoms and other information about your health helps your doctor plan your care.  Write down the symptoms, the time of day, what you were doing and what medicine you are taking.  You will soon learn how to manage your symptoms.     Notes: [facility] remains involved with PT services along with [facility] for weekly PT. Home O2 with [employer]. Will extend to allow ongoing adherence with pt's management of care. Pt currently pending appointment with neurologist and PCP on 1/13. Verified no acute symptoms over the past month with pt's COPD.       Raina Mina, RN Care Management Coordinator [place] Office [phone]

## 2020-03-05 DIAGNOSIS — I69393 Ataxia following cerebral infarction: Secondary | ICD-10-CM | POA: Diagnosis not present

## 2020-03-05 DIAGNOSIS — F0390 Unspecified dementia without behavioral disturbance: Secondary | ICD-10-CM | POA: Diagnosis not present

## 2020-03-05 DIAGNOSIS — J449 Chronic obstructive pulmonary disease, unspecified: Secondary | ICD-10-CM | POA: Diagnosis not present

## 2020-03-05 DIAGNOSIS — E039 Hypothyroidism, unspecified: Secondary | ICD-10-CM | POA: Diagnosis not present

## 2020-03-05 DIAGNOSIS — E119 Type 2 diabetes mellitus without complications: Secondary | ICD-10-CM | POA: Diagnosis not present

## 2020-03-05 DIAGNOSIS — I10 Essential (primary) hypertension: Secondary | ICD-10-CM | POA: Diagnosis not present

## 2020-03-06 ENCOUNTER — Other Ambulatory Visit: Payer: Self-pay

## 2020-03-06 ENCOUNTER — Other Ambulatory Visit: Payer: Self-pay | Admitting: *Deleted

## 2020-03-06 ENCOUNTER — Other Ambulatory Visit: Payer: Medicare Other | Admitting: *Deleted

## 2020-03-06 DIAGNOSIS — N1831 Chronic kidney disease, stage 3a: Secondary | ICD-10-CM | POA: Diagnosis not present

## 2020-03-06 DIAGNOSIS — D692 Other nonthrombocytopenic purpura: Secondary | ICD-10-CM | POA: Diagnosis not present

## 2020-03-06 DIAGNOSIS — R32 Unspecified urinary incontinence: Secondary | ICD-10-CM | POA: Diagnosis not present

## 2020-03-06 DIAGNOSIS — R454 Irritability and anger: Secondary | ICD-10-CM | POA: Diagnosis not present

## 2020-03-06 DIAGNOSIS — F0150 Vascular dementia without behavioral disturbance: Secondary | ICD-10-CM | POA: Diagnosis not present

## 2020-03-06 DIAGNOSIS — I129 Hypertensive chronic kidney disease with stage 1 through stage 4 chronic kidney disease, or unspecified chronic kidney disease: Secondary | ICD-10-CM | POA: Diagnosis not present

## 2020-03-06 DIAGNOSIS — Z515 Encounter for palliative care: Secondary | ICD-10-CM

## 2020-03-06 DIAGNOSIS — R627 Adult failure to thrive: Secondary | ICD-10-CM | POA: Diagnosis not present

## 2020-03-06 DIAGNOSIS — R82998 Other abnormal findings in urine: Secondary | ICD-10-CM | POA: Diagnosis not present

## 2020-03-06 DIAGNOSIS — R269 Unspecified abnormalities of gait and mobility: Secondary | ICD-10-CM | POA: Diagnosis not present

## 2020-03-06 DIAGNOSIS — E1140 Type 2 diabetes mellitus with diabetic neuropathy, unspecified: Secondary | ICD-10-CM | POA: Diagnosis not present

## 2020-03-06 DIAGNOSIS — Z0000 Encounter for general adult medical examination without abnormal findings: Secondary | ICD-10-CM | POA: Diagnosis not present

## 2020-03-06 DIAGNOSIS — J449 Chronic obstructive pulmonary disease, unspecified: Secondary | ICD-10-CM | POA: Diagnosis not present

## 2020-03-06 DIAGNOSIS — I422 Other hypertrophic cardiomyopathy: Secondary | ICD-10-CM | POA: Diagnosis not present

## 2020-03-06 NOTE — Patient Outreach (Signed)
[place] [facility]) Care Management  03/06/2020  DAMA HEDGEPETH 1934/08/03 [id number]   Care coordination  RN received a call from the pt's son [name]) inquired on possible resources on a Psychiatrist. States the previous psychiatrist had to be re-established and now requires a $150 fee to register prior to scheduling an appointment. This is something pt can not avoid at this time. RN provided [facility] contact number. Also called [facility] and requested any know resources concerning the above request. If available agency to contact the pt's son [name]). No further inquires at this time. If unsuccessful with these resources caregiver aware to call back and RN will further involved care-guides for available resources.   Raina Mina, RN Care Management Coordinator [place] Office [phone]

## 2020-03-06 NOTE — Progress Notes (Signed)
COMMUNITY PALLIATIVE CARE RN NOTE  PATIENT NAME: [name] DOB: Mar 03, 1934 MRN: [id number]  PRIMARY CARE PROVIDER: Shon Baton, MD  RESPONSIBLE PARTY: Philis Fendt (son) Acct ID - [place] Phone Work Phone Relationship Acct Type  [id number] [name], [name] [phone]  Self P/F     [place], [name], [place] [address]   Due to the COVID-19 crisis, this virtual check-in visit was done via telephone from my office and it was initiated and consent by this patient and or family.  PLAN OF CARE and INTERVENTION:  1. ADVANCE CARE PLANNING/GOALS OF CARE: Goal is to prevent patient falls and maintaining strength. She has a DNR. 2. PATIENT/CAREGIVER EDUCATION: Symptom management, safe mobility/transfers, fall prevention, s/s of infection 3. DISEASE STATUS: Virtual check in visit completed via telephone. Patient denies pain at this time while sitting in her recliner which is where she spends most of her day. No breathing issues. She had an appointment with her doctor today. Son states that her labs came back ok. She is intermittently confused at times and forgetful. She is able to follow commands. She is able to ambulate using her walker in the home. If she has to use the bathroom, son must transport patient in a wheelchair to get her to the bathroom quicker. She does have some urinary incontinence and wears adult briefs. Son did have some difficulty getting patient down the outside stairs of the home to take her to her appointment today. He is afraid that patient will fall. He has been speaking with several people/organizations regarding getting a ramp built. She is currently receiving PT with [facility] 2x/week. They approved her for 60 days. She has about 3 weeks. Son says that she is also receiving PT with [facility] 2x/week but only for a short period. She requires assistance with bathing and dressing. Her appetite is good. No dysphagia. She is taking her medications without  difficulty. Will continue to monitor.   HISTORY OF PRESENT ILLNESS: This is a 86 yo female with a diagnosis of dementia without behavioral disturbances. She has a h/o cerebellar stroke, TIAs, DM, ataxia and generalized weakness. Palliative care continues to follow patient and visits monthly and PRN.    CODE STATUS: DNR  ADVANCED DIRECTIVES: Y MOST FORM: yes PPS: 40%   (Duration of visit and documentation 30 minutes)   Daryl Eastern, RN BSN

## 2020-03-12 DIAGNOSIS — F0390 Unspecified dementia without behavioral disturbance: Secondary | ICD-10-CM | POA: Diagnosis not present

## 2020-03-12 DIAGNOSIS — E039 Hypothyroidism, unspecified: Secondary | ICD-10-CM | POA: Diagnosis not present

## 2020-03-12 DIAGNOSIS — E119 Type 2 diabetes mellitus without complications: Secondary | ICD-10-CM | POA: Diagnosis not present

## 2020-03-12 DIAGNOSIS — I10 Essential (primary) hypertension: Secondary | ICD-10-CM | POA: Diagnosis not present

## 2020-03-12 DIAGNOSIS — I69393 Ataxia following cerebral infarction: Secondary | ICD-10-CM | POA: Diagnosis not present

## 2020-03-12 DIAGNOSIS — J449 Chronic obstructive pulmonary disease, unspecified: Secondary | ICD-10-CM | POA: Diagnosis not present

## 2020-03-14 DIAGNOSIS — J449 Chronic obstructive pulmonary disease, unspecified: Secondary | ICD-10-CM | POA: Diagnosis not present

## 2020-03-14 DIAGNOSIS — F0390 Unspecified dementia without behavioral disturbance: Secondary | ICD-10-CM | POA: Diagnosis not present

## 2020-03-14 DIAGNOSIS — I69393 Ataxia following cerebral infarction: Secondary | ICD-10-CM | POA: Diagnosis not present

## 2020-03-14 DIAGNOSIS — E119 Type 2 diabetes mellitus without complications: Secondary | ICD-10-CM | POA: Diagnosis not present

## 2020-03-14 DIAGNOSIS — I10 Essential (primary) hypertension: Secondary | ICD-10-CM | POA: Diagnosis not present

## 2020-03-14 DIAGNOSIS — E039 Hypothyroidism, unspecified: Secondary | ICD-10-CM | POA: Diagnosis not present

## 2020-03-16 DIAGNOSIS — Z9981 Dependence on supplemental oxygen: Secondary | ICD-10-CM | POA: Diagnosis not present

## 2020-03-16 DIAGNOSIS — F0390 Unspecified dementia without behavioral disturbance: Secondary | ICD-10-CM | POA: Diagnosis not present

## 2020-03-16 DIAGNOSIS — E039 Hypothyroidism, unspecified: Secondary | ICD-10-CM | POA: Diagnosis not present

## 2020-03-16 DIAGNOSIS — Z7951 Long term (current) use of inhaled steroids: Secondary | ICD-10-CM | POA: Diagnosis not present

## 2020-03-16 DIAGNOSIS — I452 Bifascicular block: Secondary | ICD-10-CM | POA: Diagnosis not present

## 2020-03-16 DIAGNOSIS — E785 Hyperlipidemia, unspecified: Secondary | ICD-10-CM | POA: Diagnosis not present

## 2020-03-16 DIAGNOSIS — Z8673 Personal history of transient ischemic attack (TIA), and cerebral infarction without residual deficits: Secondary | ICD-10-CM | POA: Diagnosis not present

## 2020-03-16 DIAGNOSIS — N179 Acute kidney failure, unspecified: Secondary | ICD-10-CM | POA: Diagnosis not present

## 2020-03-16 DIAGNOSIS — Z87440 Personal history of urinary (tract) infections: Secondary | ICD-10-CM | POA: Diagnosis not present

## 2020-03-16 DIAGNOSIS — E119 Type 2 diabetes mellitus without complications: Secondary | ICD-10-CM | POA: Diagnosis not present

## 2020-03-16 DIAGNOSIS — J449 Chronic obstructive pulmonary disease, unspecified: Secondary | ICD-10-CM | POA: Diagnosis not present

## 2020-03-16 DIAGNOSIS — G4733 Obstructive sleep apnea (adult) (pediatric): Secondary | ICD-10-CM | POA: Diagnosis not present

## 2020-03-16 DIAGNOSIS — M160 Bilateral primary osteoarthritis of hip: Secondary | ICD-10-CM | POA: Diagnosis not present

## 2020-03-16 DIAGNOSIS — Z96652 Presence of left artificial knee joint: Secondary | ICD-10-CM | POA: Diagnosis not present

## 2020-03-16 DIAGNOSIS — I69393 Ataxia following cerebral infarction: Secondary | ICD-10-CM | POA: Diagnosis not present

## 2020-03-16 DIAGNOSIS — M353 Polymyalgia rheumatica: Secondary | ICD-10-CM | POA: Diagnosis not present

## 2020-03-16 DIAGNOSIS — Z9181 History of falling: Secondary | ICD-10-CM | POA: Diagnosis not present

## 2020-03-16 DIAGNOSIS — Z7902 Long term (current) use of antithrombotics/antiplatelets: Secondary | ICD-10-CM | POA: Diagnosis not present

## 2020-03-16 DIAGNOSIS — I10 Essential (primary) hypertension: Secondary | ICD-10-CM | POA: Diagnosis not present

## 2020-03-16 DIAGNOSIS — Z7984 Long term (current) use of oral hypoglycemic drugs: Secondary | ICD-10-CM | POA: Diagnosis not present

## 2020-03-17 DIAGNOSIS — J449 Chronic obstructive pulmonary disease, unspecified: Secondary | ICD-10-CM | POA: Diagnosis not present

## 2020-03-17 DIAGNOSIS — I10 Essential (primary) hypertension: Secondary | ICD-10-CM | POA: Diagnosis not present

## 2020-03-17 DIAGNOSIS — F0390 Unspecified dementia without behavioral disturbance: Secondary | ICD-10-CM | POA: Diagnosis not present

## 2020-03-17 DIAGNOSIS — E039 Hypothyroidism, unspecified: Secondary | ICD-10-CM | POA: Diagnosis not present

## 2020-03-17 DIAGNOSIS — E119 Type 2 diabetes mellitus without complications: Secondary | ICD-10-CM | POA: Diagnosis not present

## 2020-03-17 DIAGNOSIS — I69393 Ataxia following cerebral infarction: Secondary | ICD-10-CM | POA: Diagnosis not present

## 2020-04-01 ENCOUNTER — Other Ambulatory Visit: Payer: Self-pay

## 2020-04-01 ENCOUNTER — Other Ambulatory Visit: Payer: Medicare Other

## 2020-04-01 DIAGNOSIS — Z515 Encounter for palliative care: Secondary | ICD-10-CM

## 2020-04-02 DIAGNOSIS — I69393 Ataxia following cerebral infarction: Secondary | ICD-10-CM | POA: Diagnosis not present

## 2020-04-02 DIAGNOSIS — F0390 Unspecified dementia without behavioral disturbance: Secondary | ICD-10-CM | POA: Diagnosis not present

## 2020-04-02 DIAGNOSIS — E119 Type 2 diabetes mellitus without complications: Secondary | ICD-10-CM | POA: Diagnosis not present

## 2020-04-02 DIAGNOSIS — I10 Essential (primary) hypertension: Secondary | ICD-10-CM | POA: Diagnosis not present

## 2020-04-02 DIAGNOSIS — J449 Chronic obstructive pulmonary disease, unspecified: Secondary | ICD-10-CM | POA: Diagnosis not present

## 2020-04-02 DIAGNOSIS — E039 Hypothyroidism, unspecified: Secondary | ICD-10-CM | POA: Diagnosis not present

## 2020-04-03 NOTE — Progress Notes (Signed)
COMMUNITY PALLIATIVE CARE SW NOTE  PATIENT NAME: [name] DOB: [birth year] MRN: [id number]  PRIMARY CARE PROVIDER: Shon Baton, MD  RESPONSIBLE PARTY:  Acct ID - Guarantor Home Phone Work Phone Relationship Acct Type  [id number] [name], [name] [phone]  Self P/F     [place], [name], [place] [address]   Due to the COVID-19 crisis, this virtual check-in visit was done via telephone from my office and it was initiated and consent by this patient and or family.  PLAN OF CARE and INTERVENTIONS:             1. GOALS OF CARE/ ADVANCE CARE PLANNING:  Goal is for patient continues to independently ambultate with a walker. Patient is a DNR. 1.  2. SOCIAL/EMOTIONAL/SPIRITUAL ASSESSMENT/ INTERVENTIONS:  SW completed a telephonic visit with patient's son-[name]. [name] provided an update on patient status.He explained that patient is weak and stable. He stated that patient was recovering from a UTI that was treated with a antibiotic. [name] report that patient has been discharged from all therapies. [name] was concern about patient's mobility as they was the only exercise she was getting. [name] report patient is now weaker and only getting up about 3/4 times a day to use the bathroom. Patient is no longer going out to get her hair done or to run errands because of the increased weakness and overall poor mobility. [name] report that he is having some financial issues. He has reached out to social services and the city for help. He is exploring some other agencies that maybe able to help him also. Patient's appetite remains fair. She is independent for personal care needs. Patient is having increased continence episodes. Her daughter assist with any patient showers. Patient cognitive deficits are increasing. [name] is open to ongoing support by the palliative care team. SW provided active and reflective listening, supportive counseling, provided financial resources, and reassurance of support.   3. PATIENT/CAREGIVER EDUCATION/ COPING:  Patient and son appear to be coping well. They have a supportive family. 4. PERSONAL EMERGENCY PLAN:  911 can be activated for emergencies. 5. COMMUNITY RESOURCES COORDINATION/ HEALTH CARE NAVIGATION:  None. 6. FINANCIAL/LEGAL CONCERNS/INTERVENTIONS:  [facility] expressed financial concerns, but will seek resources for assistance..     SOCIAL HX:  Social History   Tobacco Use  . Smoking status: Former Smoker    Quit date: 11/02/2004    Years since quitting: 15.4  . Smokeless tobacco: Never Used  Substance Use Topics  . Alcohol use: No    CODE STATUS: DNR ADVANCED DIRECTIVES: Yes MOST FORM COMPLETE:  Yes HOSPICE EDUCATION PROVIDED: No  PPS:  Patientis having decreasedambulation and unsteady gait. Sheis having increased forgetfulness and confusion.Patientrequires assistance with ADLs.   Duration of telephonic visit and documentation30 minutes       Katheren Puller, LCSW

## 2020-04-04 ENCOUNTER — Other Ambulatory Visit: Payer: Self-pay | Admitting: *Deleted

## 2020-04-04 DIAGNOSIS — I10 Essential (primary) hypertension: Secondary | ICD-10-CM | POA: Diagnosis not present

## 2020-04-04 DIAGNOSIS — F0390 Unspecified dementia without behavioral disturbance: Secondary | ICD-10-CM | POA: Diagnosis not present

## 2020-04-04 DIAGNOSIS — E119 Type 2 diabetes mellitus without complications: Secondary | ICD-10-CM | POA: Diagnosis not present

## 2020-04-04 DIAGNOSIS — I69393 Ataxia following cerebral infarction: Secondary | ICD-10-CM | POA: Diagnosis not present

## 2020-04-04 DIAGNOSIS — E039 Hypothyroidism, unspecified: Secondary | ICD-10-CM | POA: Diagnosis not present

## 2020-04-04 DIAGNOSIS — J449 Chronic obstructive pulmonary disease, unspecified: Secondary | ICD-10-CM | POA: Diagnosis not present

## 2020-04-04 NOTE — Patient Outreach (Signed)
[facility]) Care Management  04/04/2020  YASLYN CUMBY 28-Jun-1934 [id number]  Telephone Assessment-Successful-COPD  RN spoke with pt's primary caregiver Dellis Filbert) today who provided an update on pt's ongoing management of care. Reports things are in play however slow but continue to have some progress. Pt continue to do well in managing her COPD with no flare-ups or related symptoms. Pt remains in the GREEN zone with no new issues. Caregiver reports the following services involved and noted within the review and discussed plan of care below. [name] remains with 2 additional PT visits, [facility] continue to visit and may involved PT once [facility] is done and the [employer] pending funds to build a ramp for easier access into the pt's home.  Son also indicated the appointment to see the pt's psychiatrist is still pending for a change in pt's medications. Dr. Virgina Jock is aware of this delay. Reports pt may travel for one week to [place] this summer to her daughter's home however son Dellis Filbert) having inquires concerning pt's needing home O2 (while visiting for that short stay. Pt only on home O2 at night (3 liters) but needs this oxygenation -does better during the day. RN encourage the son to call [employer] for possible rental of a mor complex portable to accommodate pt's needs for the anticipated trip. No other inquires as the plan of care discussed with noted progress and changes.   Will follow up next month due to all involvements and pending services. No other inquires or request as caregiver aware [facility] remains available for needed resources with social workers and pharmacy if needed.   Goals Addressed            This Visit's Progress   . [facility]-Disease Progression Minimized or Managed   On track    Follow up Date 05/01/2020 Timeframe:  Long-Range Goal Priority:  Medium Start Date:   04/04/2020                  Expected End Date:    07/22/2020                      Evidence-based guidance:   Identify current smoking/tobacco use; provide smoking cessation intervention.   Assess symptom control by the frequency and type of symptoms, reliever use and activity limitation at every encounter.   Assess risk for exacerbation (flare up) by evaluating spirometry, pulse oximetry, reliever use, presentation of symptoms and activity limitation; anticipate treatment adjustment based on risks and resources.   Develop and/or review and reinforce use of COPD rescue (action) plan even when symptoms are controlled or infrequent.   Ask patient to bring inhaler to all visits; assess and reinforce correct technique; address barriers to proper inhaler use, such as older age, use of multiple devices and lack of understanding.    Identify symptom triggers, such as smoking, virus, weather change, emotional upset, exercise, obesity and environmental allergen; consider reduction of work-exposure versus elimination to avoid compromising employment.   Correlate presentation to comorbidity, such as diabetes, heart failure, obstructive sleep apnea, depression and anxiety, which may worsen symptoms.   Prepare for individualized pharmacologic therapy that may include LABA (long-acting beta-2 agonist), LAMA (long-acting muscarinic antagonist), SABA (short-acting beta-2 agonist) oral or inhaled corticosteroid.   Promote participation in pulmonary rehabilitation for breathing exercises, skills training, improved exercise capacity, mood and quality of life; address barriers to participation.   Promote physical activity or exercise to improve or maintain exercise capacity, based on  tolerance that may include walking, water exercise, cycling or limb muscle strength training.   Promote use of energy conservation and activity pacing techniques.   Promote use of breathing and coughing techniques, such as inspiratory muscle training, pursed-lip breathing, diaphragmatic breathing, pranayama  yoga breathing or huff cough.   Screen for malnutrition risk factors, such as unintentional weight loss and poor oral intake; refer to dietitian if identified.   Consider recommendation for oral drink supplement or multivitamin and mineral supplements if suspect inadequate oral intake or micronutrient deficiencies.    Screen for obstructive sleep apnea; prepare patient for polysomnography based on risk and presentation.   Prepare patient for use of long-term oxygen and noninvasive ventilation to relieve hypercapnia, hypoxemia, obstructive sleep apnea and reduce work of breathing.   Prepare patient with worsening disease for surgical interventions that may include bronchoscopy, lung volume reduction surgery, bullectomy or lung transplantation.   Notes:  Feb-Based upon pt's needs several agencies are involved with her care. [facility] 2 more visits remain, [facility] and pending [place] for building a ramp for this pt. Currently awaiting funds to process. Discuss minimizing visitors that maybe sick to lower the risk for possible infection. Son verbalizes the importance of all discussed today.     Baker Pierini and Manage My Symptoms   On track    Follow Up Date 04/04/2009 Timeframe:  Long-Range Goal Priority:  Medium Start Date:     03/04/2020                        Expected End Date:    06/20/2020                     - develop a rescue plan - eliminate symptom triggers at home - follow rescue plan if symptoms flare-up - keep follow-up appointments    Why is this important?   Tracking your symptoms and other information about your health helps your doctor plan your care.  Write down the symptoms, the time of day, what you were doing and what medicine you are taking.  You will soon learn how to manage your symptoms.     Notes:  FEB- Caregiver son [name]) reports [name] will be ending after 2 additional visits and he would like to continue some form of therapy. Encouraged  son to inquire with [facility] since they remain involved for weekly PT services. Note they have provided this services in the past.   [facility] remains involved with PT services along with [facility] for weekly PT. Home O2 with [employer]. Will extend to allow ongoing adherence with pt's management of care. Pt currently pending appointment with neurologist and PCP on 1/13. Verified no acute symptoms over the past month with pt's COPD.       Raina Mina, RN Care Management Coordinator [place] Office [phone]

## 2020-04-09 DIAGNOSIS — E119 Type 2 diabetes mellitus without complications: Secondary | ICD-10-CM | POA: Diagnosis not present

## 2020-04-09 DIAGNOSIS — I10 Essential (primary) hypertension: Secondary | ICD-10-CM | POA: Diagnosis not present

## 2020-04-09 DIAGNOSIS — I69393 Ataxia following cerebral infarction: Secondary | ICD-10-CM | POA: Diagnosis not present

## 2020-04-09 DIAGNOSIS — J449 Chronic obstructive pulmonary disease, unspecified: Secondary | ICD-10-CM | POA: Diagnosis not present

## 2020-04-09 DIAGNOSIS — F0390 Unspecified dementia without behavioral disturbance: Secondary | ICD-10-CM | POA: Diagnosis not present

## 2020-04-09 DIAGNOSIS — E039 Hypothyroidism, unspecified: Secondary | ICD-10-CM | POA: Diagnosis not present

## 2020-04-23 DIAGNOSIS — F3181 Bipolar II disorder: Secondary | ICD-10-CM | POA: Diagnosis not present

## 2020-04-25 DIAGNOSIS — J984 Other disorders of lung: Secondary | ICD-10-CM | POA: Diagnosis not present

## 2020-05-01 ENCOUNTER — Other Ambulatory Visit: Payer: Self-pay | Admitting: *Deleted

## 2020-05-01 NOTE — Patient Outreach (Addendum)
[name]) Care Management  05/01/2020  [name] 03-18-1934 [id number]  Telephone Assessment: Successful-HF  RN spoke with the pt's [name]) and reviewed and discussed the current plan of care with noted updates on pt's progress. Son provided updates on all of the involved agencies as followed.  Agency (Ramp)- Reports agency remain in progress pending ramp building. No update from the company directly. [name]-finished PT with pt recent however plans are to return after 30-60 days prior to submitted another notice to pt's [facility] for another round of PT services. States the agency is attempted to maintain pt's current states with her strength to prevent future falls/injuries. Strongly encouraged pt to use all her assisted devices to avoid falls/injuries. Palliative ([facility])- Agency has been calling monthly however son will be requesting a home visit on the next telephonic outreach. [facility] provider discontinued Latuda and replaced with Seroquel however will take a few weeks to notice any changes.  Verified pt is hydrating and food sufficiently with no delays. Pt continue to be in the GREEN zone with no acute events related to managing her COPD.  Continue to offer available assistance if needed via [place] or social worker if needed (none at this time).   Will continue to follow up monthly (April) and inquired on pt's ongoing progress in managing her ongoing care. Will speak to pt's [name]) at that time. Will also update Dr. Virgina Howell on pt's progress with [facility] services for the quarterly review/update. Goals Addressed            This Visit's Progress   . [facility]-Disease Progression Minimized or Managed   On track    Follow up Date 06/02/2020 Timeframe:  Long-Range Goal Priority:  Medium Start Date:   04/04/2020                  Expected End Date:    07/22/2020                     Evidence-based guidance:   Identify current smoking/tobacco  use; provide smoking cessation intervention.   Assess symptom control by the frequency and type of symptoms, reliever use and activity limitation at every encounter.   Assess risk for exacerbation (flare up) by evaluating spirometry, pulse oximetry, reliever use, presentation of symptoms and activity limitation; anticipate treatment adjustment based on risks and resources.   Develop and/or review and reinforce use of COPD rescue (action) plan even when symptoms are controlled or infrequent.   Ask patient to bring inhaler to all visits; assess and reinforce correct technique; address barriers to proper inhaler use, such as older age, use of multiple devices and lack of understanding.    Identify symptom triggers, such as smoking, virus, weather change, emotional upset, exercise, obesity and environmental allergen; consider reduction of work-exposure versus elimination to avoid compromising employment.   Correlate presentation to comorbidity, such as diabetes, heart failure, obstructive sleep apnea, depression and anxiety, which may worsen symptoms.   Prepare for individualized pharmacologic therapy that may include LABA (long-acting beta-2 agonist), LAMA (long-acting muscarinic antagonist), SABA (short-acting beta-2 agonist) oral or inhaled corticosteroid.   Promote participation in pulmonary rehabilitation for breathing exercises, skills training, improved exercise capacity, mood and quality of life; address barriers to participation.   Promote physical activity or exercise to improve or maintain exercise capacity, based on tolerance that may include walking, water exercise, cycling or limb muscle strength training.   Promote use of energy conservation and activity  pacing techniques.   Promote use of breathing and coughing techniques, such as inspiratory muscle training, pursed-lip breathing, diaphragmatic breathing, pranayama yoga breathing or huff cough.   Screen for malnutrition risk  factors, such as unintentional weight loss and poor oral intake; refer to dietitian if identified.   Consider recommendation for oral drink supplement or multivitamin and mineral supplements if suspect inadequate oral intake or micronutrient deficiencies.    Screen for obstructive sleep apnea; prepare patient for polysomnography based on risk and presentation.   Prepare patient for use of long-term oxygen and noninvasive ventilation to relieve hypercapnia, hypoxemia, obstructive sleep apnea and reduce work of breathing.   Prepare patient with worsening disease for surgical interventions that may include bronchoscopy, lung volume reduction surgery, bullectomy or lung transplantation.   Notes:  Feb-Based upon pt's needs several agencies are involved with her care. [facility] 2 more visits remain, [facility] and pending [place] for building a ramp for this pt. Currently awaiting funds to process. Discuss minimizing visitors that maybe sick to lower the risk for possible infection. Son verbalizes the importance of all discussed today.     [facility] and Manage My Symptoms   On track    Follow Up Date 06/02/2009 Timeframe:  Long-Range Goal Priority:  Medium Start Date:     03/04/2020                        Expected End Date:    06/20/2020                     - develop a rescue plan - eliminate symptom triggers at home - follow rescue plan if symptoms flare-up - keep follow-up appointments    Why is this important?   Tracking your symptoms and other information about your health helps your doctor plan your care.  Write down the symptoms, the time of day, what you were doing and what medicine you are taking.  You will soon learn how to manage your symptoms.     Notes: 3/10-Caregiver reports pt doing well however recent partial fall as pt remains weak. States he is assisting pt more each day due to her ongoing weakness. [facility] PT just finished working with pt for PT with plans  for pt to have PT once again after 30-60 days clearance for [facility] to approve once again. [facility] RN visited with rhonic noted and portable X-ray indicated pneumonia. Pt now on 10 days of antibiotics (today #7). Pt remains on Home O2 #3 liters with good readings. FEB- Caregiver [name]) reports [name] will be ending after 2 additional visits and he would like to continue some form of therapy. Encouraged son to inquire with [facility] since they remain involved for weekly PT services. Note they have provided this services in the past.   [facility] remains involved with PT services along with [facility] for weekly PT. Home O2 with [employer]. Will extend to allow ongoing adherence with pt's management of care. Pt currently pending appointment with neurologist and PCP on 1/13. Verified no acute symptoms over the past month with pt's COPD.       Raina Mina, RN Care Management Coordinator [place] Office [phone]

## 2020-05-02 ENCOUNTER — Other Ambulatory Visit: Payer: Self-pay | Admitting: *Deleted

## 2020-05-02 NOTE — Patient Outreach (Signed)
[facility]) Care Management  05/02/2020  LESLIE JESTER [birth year] [id number]   Incoming update from pt's son [name]) via voice message to RN case manager phone line.   Son [name]) left a message concerning pt having several falls however PT with [name] just finished and there needed to be a waiting period 30-60 days prior to started PT services back up with [facility]. Pt's son [name]) inquired with Dr. Virgina Jock on beginning services sooner to prevent ongoing falls. Dr. Virgina Jock agreed and Rosita Kea to start PT services once again immediately. [name] has contact the pt's son [name] and services for PT will start again next. On the voice message son [name]) did not need a call back just wanted to update RN case manager on the invovled services once again with [facility]. Note [facility] also remains involved with this pt.   Will follow up again next month as scheduled.  Raina Mina, RN Care Management Coordinator [place] Office [phone]

## 2020-05-05 ENCOUNTER — Telehealth: Payer: Self-pay | Admitting: *Deleted

## 2020-05-05 NOTE — Telephone Encounter (Signed)
Received a communication that patient's son, Dellis Filbert called and requested a call back. Returned call and spoke with son as requested. He states that he feels that his mother is declining. She has had 2-3 falls and is very weak. She has been working with [name] PT, who's goal has switched to try and maintain the strength that the patient currently has. Palliative care visit scheduled for 3/16@2p .

## 2020-05-07 ENCOUNTER — Other Ambulatory Visit: Payer: Self-pay

## 2020-05-07 ENCOUNTER — Other Ambulatory Visit: Payer: Medicare Other | Admitting: *Deleted

## 2020-05-07 DIAGNOSIS — Z515 Encounter for palliative care: Secondary | ICD-10-CM

## 2020-05-08 DIAGNOSIS — R350 Frequency of micturition: Secondary | ICD-10-CM | POA: Diagnosis not present

## 2020-05-08 DIAGNOSIS — N3946 Mixed incontinence: Secondary | ICD-10-CM | POA: Diagnosis not present

## 2020-05-24 DIAGNOSIS — R29898 Other symptoms and signs involving the musculoskeletal system: Secondary | ICD-10-CM | POA: Diagnosis not present

## 2020-05-24 DIAGNOSIS — T43595D Adverse effect of other antipsychotics and neuroleptics, subsequent encounter: Secondary | ICD-10-CM | POA: Diagnosis not present

## 2020-05-24 DIAGNOSIS — E1142 Type 2 diabetes mellitus with diabetic polyneuropathy: Secondary | ICD-10-CM | POA: Diagnosis not present

## 2020-05-24 DIAGNOSIS — E1122 Type 2 diabetes mellitus with diabetic chronic kidney disease: Secondary | ICD-10-CM | POA: Diagnosis not present

## 2020-05-24 DIAGNOSIS — E538 Deficiency of other specified B group vitamins: Secondary | ICD-10-CM | POA: Diagnosis not present

## 2020-05-24 DIAGNOSIS — F0150 Vascular dementia without behavioral disturbance: Secondary | ICD-10-CM | POA: Diagnosis not present

## 2020-05-24 DIAGNOSIS — N1831 Chronic kidney disease, stage 3a: Secondary | ICD-10-CM | POA: Diagnosis not present

## 2020-05-24 DIAGNOSIS — F3341 Major depressive disorder, recurrent, in partial remission: Secondary | ICD-10-CM | POA: Diagnosis not present

## 2020-05-24 DIAGNOSIS — E785 Hyperlipidemia, unspecified: Secondary | ICD-10-CM | POA: Diagnosis not present

## 2020-05-24 DIAGNOSIS — I422 Other hypertrophic cardiomyopathy: Secondary | ICD-10-CM | POA: Diagnosis not present

## 2020-05-24 DIAGNOSIS — H547 Unspecified visual loss: Secondary | ICD-10-CM | POA: Diagnosis not present

## 2020-05-24 DIAGNOSIS — M5136 Other intervertebral disc degeneration, lumbar region: Secondary | ICD-10-CM | POA: Diagnosis not present

## 2020-05-24 DIAGNOSIS — I69398 Other sequelae of cerebral infarction: Secondary | ICD-10-CM | POA: Diagnosis not present

## 2020-05-24 DIAGNOSIS — G4733 Obstructive sleep apnea (adult) (pediatric): Secondary | ICD-10-CM | POA: Diagnosis not present

## 2020-05-24 DIAGNOSIS — I1310 Hypertensive heart and chronic kidney disease without heart failure, with stage 1 through stage 4 chronic kidney disease, or unspecified chronic kidney disease: Secondary | ICD-10-CM | POA: Diagnosis not present

## 2020-05-24 DIAGNOSIS — M1711 Unilateral primary osteoarthritis, right knee: Secondary | ICD-10-CM | POA: Diagnosis not present

## 2020-05-24 DIAGNOSIS — I4891 Unspecified atrial fibrillation: Secondary | ICD-10-CM | POA: Diagnosis not present

## 2020-05-24 DIAGNOSIS — J449 Chronic obstructive pulmonary disease, unspecified: Secondary | ICD-10-CM | POA: Diagnosis not present

## 2020-05-24 DIAGNOSIS — E559 Vitamin D deficiency, unspecified: Secondary | ICD-10-CM | POA: Diagnosis not present

## 2020-05-24 DIAGNOSIS — M353 Polymyalgia rheumatica: Secondary | ICD-10-CM | POA: Diagnosis not present

## 2020-05-24 DIAGNOSIS — G2119 Other drug induced secondary parkinsonism: Secondary | ICD-10-CM | POA: Diagnosis not present

## 2020-05-24 DIAGNOSIS — I452 Bifascicular block: Secondary | ICD-10-CM | POA: Diagnosis not present

## 2020-05-24 DIAGNOSIS — M810 Age-related osteoporosis without current pathological fracture: Secondary | ICD-10-CM | POA: Diagnosis not present

## 2020-05-24 DIAGNOSIS — D692 Other nonthrombocytopenic purpura: Secondary | ICD-10-CM | POA: Diagnosis not present

## 2020-05-24 DIAGNOSIS — I081 Rheumatic disorders of both mitral and tricuspid valves: Secondary | ICD-10-CM | POA: Diagnosis not present

## 2020-05-30 DIAGNOSIS — E1142 Type 2 diabetes mellitus with diabetic polyneuropathy: Secondary | ICD-10-CM | POA: Diagnosis not present

## 2020-05-30 DIAGNOSIS — E1122 Type 2 diabetes mellitus with diabetic chronic kidney disease: Secondary | ICD-10-CM | POA: Diagnosis not present

## 2020-05-30 DIAGNOSIS — I1310 Hypertensive heart and chronic kidney disease without heart failure, with stage 1 through stage 4 chronic kidney disease, or unspecified chronic kidney disease: Secondary | ICD-10-CM | POA: Diagnosis not present

## 2020-05-30 DIAGNOSIS — T43595D Adverse effect of other antipsychotics and neuroleptics, subsequent encounter: Secondary | ICD-10-CM | POA: Diagnosis not present

## 2020-05-30 DIAGNOSIS — M353 Polymyalgia rheumatica: Secondary | ICD-10-CM | POA: Diagnosis not present

## 2020-05-30 DIAGNOSIS — G2119 Other drug induced secondary parkinsonism: Secondary | ICD-10-CM | POA: Diagnosis not present

## 2020-05-30 NOTE — Progress Notes (Signed)
COMMUNITY PALLIATIVE CARE RN NOTE  PATIENT NAME: [name] DOB: Feb 17, 1935 MRN: [id number]  PRIMARY CARE PROVIDER: Shon Baton, MD  RESPONSIBLE PARTY: Philis Fendt (son) Acct ID - Guarantor Home Phone Work Phone Relationship Acct Type  [id number] MYANNA, ZIESMER [phone]  Self P/F     [place] [address], [name], [place] [address]   Covid-19 Pre-screening Negative  PLAN OF CARE and INTERVENTION:  1. ADVANCE CARE PLANNING/GOALS OF CARE: Goal is for patient to remain in her home with her son as caregiver. She has a DNR. 2. PATIENT/CAREGIVER EDUCATION: Symptom management, safe mobility/transfers, fall prevention, s/s of infection 3. DISEASE STATUS: Met with patient and her son, [name], in their home. Upon arrival, patient is sitting up in her recliner awake and alert. She is able to answer questions and make her needs known. She does experience some intermittent confusion and forgetfulness and [name] feels her dementia is worsening, especially in regards to her short term memory. She recently had a mobile chest x-ray and was diagnosed with Pneumonia. She just completed her 10 day course of antibiotics on Monday. She still has some throat congestion and is working on using her incentive spirometer. She has had 2 recent falls. She was confused and got up in the middle of the night, thinking that her mother asked her to bring her some bowls and had a fall. She had another fall in her bedroom. Her son was standing beside her and reached to get something briefly and patient fell. No apparent injuries. She is progressively weaker. She is no longer able to get up without assistance. She also cannot stand without someone holding onto her or she will fall. She is ambulatory once standing using a rollator walker. She has to be transported in the wheelchair at times when she has to go to the bathroom, as she would not make it there in time walking. [facility] sends PT out to work with patient once a month.  She worked with PT yesterday on some arm exercises and sitting leg lifts and slept for the rest of the day. Patient has received home health PT several times with [name] but was discharged last month. [name] says that since patient has had 2 falls that [name] can now work with her again, but they are waiting for new PT orders. She continues to require 1 person assistance with bathing and dressing. She is intermittently incontinent of both bowel and bladder and wears Depends. Her appetite is still good. [name] says that she recently was switched from [place] to Seroquel but knows it will take time to get into her system. She has an appointment with her Nephrologist tomorrow. [name] suspects she has another UTI. She has been drinking more cranberry juice lately due to this. He wants to continue with therapy for as long as he can as he says the goal has shifted to just trying to maintain the strength that she has. Will continue to monitor.  HISTORY OF PRESENT ILLNESS: This is a 86 yo female with a diagnosis of dementia without behavioral disturbances. She has a h/o cerebellar stroke, TIAs, DM, ataxia and generalized weakness. Palliative care continues to follow patient and visits monthly and    CODE STATUS: DNR ADVANCED DIRECTIVES: Y MOST FORM: yes PPS: 30%   PHYSICAL EXAM:   LUNGS: clear to auscultation  CARDIAC: Cor RRR EXTREMITIES: No edema SKIN: Thin/frail skin; bruises easily  NEURO: Alert and oriented to person/place, increased intermittent confusion, poor short term memory, ambulatory w/walker once assisted  to standing position    (Duration of visit and documentation 60 minutes)   Daryl Eastern, RN BSN

## 2020-06-02 ENCOUNTER — Other Ambulatory Visit: Payer: Self-pay | Admitting: *Deleted

## 2020-06-02 DIAGNOSIS — M353 Polymyalgia rheumatica: Secondary | ICD-10-CM | POA: Diagnosis not present

## 2020-06-02 DIAGNOSIS — T43595D Adverse effect of other antipsychotics and neuroleptics, subsequent encounter: Secondary | ICD-10-CM | POA: Diagnosis not present

## 2020-06-02 DIAGNOSIS — I1310 Hypertensive heart and chronic kidney disease without heart failure, with stage 1 through stage 4 chronic kidney disease, or unspecified chronic kidney disease: Secondary | ICD-10-CM | POA: Diagnosis not present

## 2020-06-02 DIAGNOSIS — E1142 Type 2 diabetes mellitus with diabetic polyneuropathy: Secondary | ICD-10-CM | POA: Diagnosis not present

## 2020-06-02 DIAGNOSIS — E1122 Type 2 diabetes mellitus with diabetic chronic kidney disease: Secondary | ICD-10-CM | POA: Diagnosis not present

## 2020-06-02 DIAGNOSIS — G2119 Other drug induced secondary parkinsonism: Secondary | ICD-10-CM | POA: Diagnosis not present

## 2020-06-02 NOTE — Patient Outreach (Signed)
[facility]) Care Management  06/02/2020  [name] 02/25/1934 [id number]   Telephone Assessment-Unsuccessful  RN attempted outreach call however unsuccessful. RN able to leave a HIPAA approved voice message requesting a call back.  Will attempt to call over the next week.  Raina Mina, RN Care Management Coordinator [place] Office [phone]

## 2020-06-04 ENCOUNTER — Other Ambulatory Visit: Payer: Self-pay | Admitting: *Deleted

## 2020-06-04 DIAGNOSIS — N390 Urinary tract infection, site not specified: Secondary | ICD-10-CM | POA: Diagnosis not present

## 2020-06-04 NOTE — Patient Outreach (Signed)
[facility]) Care Management  06/04/2020  GABREILLE DARDIS [birth year] [id number]   Telephone Assessment-Successful-COPD  RN spoke with pt's POA son today [name]) reporting pt continue to do well with no flare ups with her COPD however remains weak with her mobility but continues to work with [facility] for PT with ROM exercises. Reports pt has had a fall in between the PT visits but no injuries. Pt continues to have involvement with Palliative care ([facility]) who has indicated pt not ready for hospice home health at this time but will remain with Palliative services. Pt suspected of having  UTI. Son [name]) obtained a urine sample for [facility] to send for testing. Pt will have a urologist visit on 5/1 for follow up on the sample sent. No other issues or events mentioned at this time.  Will follow up next month with ongoing [facility] services. Reminded caregiver [name]) on other disciplines with [facility] if needed.  Review and discussed the currently plan of care with notations on all updates accordingly. Will encouraged ongoing adherence and provide the needed resources for ongoing management of care.  Goals Addressed            This Visit's Progress   . THN-Disease Progression Minimized or Managed   On track    Follow up Date 07/03/2020 Timeframe:  Long-Range Goal Priority:  Medium Start Date:   04/04/2020                  Expected End Date:    07/22/2020                     Evidence-based guidance:   Identify current smoking/tobacco use; provide smoking cessation intervention.   Assess symptom control by the frequency and type of symptoms, reliever use and activity limitation at every encounter.   Assess risk for exacerbation (flare up) by evaluating spirometry, pulse oximetry, reliever use, presentation of symptoms and activity limitation; anticipate treatment adjustment based on risks and resources.   Develop and/or review and reinforce use of COPD rescue (action) plan even  when symptoms are controlled or infrequent.   Ask patient to bring inhaler to all visits; assess and reinforce correct technique; address barriers to proper inhaler use, such as older age, use of multiple devices and lack of understanding.    Identify symptom triggers, such as smoking, virus, weather change, emotional upset, exercise, obesity and environmental allergen; consider reduction of work-exposure versus elimination to avoid compromising employment.   Correlate presentation to comorbidity, such as diabetes, heart failure, obstructive sleep apnea, depression and anxiety, which may worsen symptoms.   Prepare for individualized pharmacologic therapy that may include LABA (long-acting beta-2 agonist), LAMA (long-acting muscarinic antagonist), SABA (short-acting beta-2 agonist) oral or inhaled corticosteroid.   Promote participation in pulmonary rehabilitation for breathing exercises, skills training, improved exercise capacity, mood and quality of life; address barriers to participation.   Promote physical activity or exercise to improve or maintain exercise capacity, based on tolerance that may include walking, water exercise, cycling or limb muscle strength training.   Promote use of energy conservation and activity pacing techniques.   Promote use of breathing and coughing techniques, such as inspiratory muscle training, pursed-lip breathing, diaphragmatic breathing, pranayama yoga breathing or huff cough.   Screen for malnutrition risk factors, such as unintentional weight loss and poor oral intake; refer to dietitian if identified.   Consider recommendation for oral drink supplement or multivitamin and mineral supplements if suspect inadequate oral intake  or micronutrient deficiencies.    Screen for obstructive sleep apnea; prepare patient for polysomnography based on risk and presentation.   Prepare patient for use of long-term oxygen and noninvasive ventilation to relieve  hypercapnia, hypoxemia, obstructive sleep apnea and reduce work of breathing.   Prepare patient with worsening disease for surgical interventions that may include bronchoscopy, lung volume reduction surgery, bullectomy or lung transplantation.   Notes:  Feb-Based upon pt's needs several agencies are involved with her care. [facility] 2 more visits remain, [facility] and pending [place] for building a ramp for this pt. Currently awaiting funds to process. Discuss minimizing visitors that maybe sick to lower the risk for possible infection. Son verbalizes the importance of all discussed today.     Baker Pierini and Manage My Symptoms   On track    Follow Up Date 07/03/2009 Timeframe:  Long-Range Goal Priority:  Medium Start Date:     03/04/2020                        Expected End Date:    09/19/2020                     - develop a rescue plan - eliminate symptom triggers at home - follow rescue plan if symptoms flare-up - keep follow-up appointments    Why is this important?   Tracking your symptoms and other information about your health helps your doctor plan your care.  Write down the symptoms, the time of day, what you were doing and what medicine you are taking.  You will soon learn how to manage your symptoms.     Notes: 4/13-Pt continue to do well and has not used her emergence inhalers and continue to use her home O2 consistently with no reported system issues. However son [name] has indicated changing oxygen supplier and requested resources. Resources were supplied on today's call for other O2 suppliers in the area ([place], [place] and [facility]). 3/10-Caregiver reports pt doing well however recent partial fall as pt remains weak. States he is assisting pt more each day due to her ongoing weakness. [facility] just finished working with pt for PT with plans for pt to have PT once again after 30-60 days clearance for [facility] to approve once again. [facility] RN  visited with rhonic noted and portable X-ray indicated pneumonia. Pt now on 10 days of antibiotics (today #7). Pt remains on Home O2 #3 liters with good readings. FEB- Caregiver son [name]) reports [name] will be ending after 2 additional visits and he would like to continue some form of therapy. Encouraged son to inquire with [facility] since they remain involved for weekly PT services. Note they have provided this services in the past.   [facility] remains involved with PT services along with [facility] for weekly PT. Home O2 with [facility]. Will extend to allow ongoing adherence with pt's management of care. Pt currently pending appointment with neurologist and PCP on 1/13. Verified no acute symptoms over the past month with pt's COPD.       Raina Mina, RN Care Management Coordinator [place] Office [phone]

## 2020-06-06 ENCOUNTER — Ambulatory Visit: Payer: Self-pay | Admitting: *Deleted

## 2020-06-06 DIAGNOSIS — I1310 Hypertensive heart and chronic kidney disease without heart failure, with stage 1 through stage 4 chronic kidney disease, or unspecified chronic kidney disease: Secondary | ICD-10-CM | POA: Diagnosis not present

## 2020-06-06 DIAGNOSIS — E1142 Type 2 diabetes mellitus with diabetic polyneuropathy: Secondary | ICD-10-CM | POA: Diagnosis not present

## 2020-06-06 DIAGNOSIS — T43595D Adverse effect of other antipsychotics and neuroleptics, subsequent encounter: Secondary | ICD-10-CM | POA: Diagnosis not present

## 2020-06-06 DIAGNOSIS — M353 Polymyalgia rheumatica: Secondary | ICD-10-CM | POA: Diagnosis not present

## 2020-06-06 DIAGNOSIS — E1122 Type 2 diabetes mellitus with diabetic chronic kidney disease: Secondary | ICD-10-CM | POA: Diagnosis not present

## 2020-06-06 DIAGNOSIS — G2119 Other drug induced secondary parkinsonism: Secondary | ICD-10-CM | POA: Diagnosis not present

## 2020-06-10 DIAGNOSIS — M353 Polymyalgia rheumatica: Secondary | ICD-10-CM | POA: Diagnosis not present

## 2020-06-10 DIAGNOSIS — I1310 Hypertensive heart and chronic kidney disease without heart failure, with stage 1 through stage 4 chronic kidney disease, or unspecified chronic kidney disease: Secondary | ICD-10-CM | POA: Diagnosis not present

## 2020-06-10 DIAGNOSIS — E1142 Type 2 diabetes mellitus with diabetic polyneuropathy: Secondary | ICD-10-CM | POA: Diagnosis not present

## 2020-06-10 DIAGNOSIS — E1122 Type 2 diabetes mellitus with diabetic chronic kidney disease: Secondary | ICD-10-CM | POA: Diagnosis not present

## 2020-06-10 DIAGNOSIS — T43595D Adverse effect of other antipsychotics and neuroleptics, subsequent encounter: Secondary | ICD-10-CM | POA: Diagnosis not present

## 2020-06-10 DIAGNOSIS — G2119 Other drug induced secondary parkinsonism: Secondary | ICD-10-CM | POA: Diagnosis not present

## 2020-06-13 ENCOUNTER — Other Ambulatory Visit: Payer: Medicare Other

## 2020-06-13 ENCOUNTER — Other Ambulatory Visit: Payer: Self-pay

## 2020-06-13 DIAGNOSIS — G2119 Other drug induced secondary parkinsonism: Secondary | ICD-10-CM | POA: Diagnosis not present

## 2020-06-13 DIAGNOSIS — I1310 Hypertensive heart and chronic kidney disease without heart failure, with stage 1 through stage 4 chronic kidney disease, or unspecified chronic kidney disease: Secondary | ICD-10-CM | POA: Diagnosis not present

## 2020-06-13 DIAGNOSIS — T43595D Adverse effect of other antipsychotics and neuroleptics, subsequent encounter: Secondary | ICD-10-CM | POA: Diagnosis not present

## 2020-06-13 DIAGNOSIS — E1142 Type 2 diabetes mellitus with diabetic polyneuropathy: Secondary | ICD-10-CM | POA: Diagnosis not present

## 2020-06-13 DIAGNOSIS — E1122 Type 2 diabetes mellitus with diabetic chronic kidney disease: Secondary | ICD-10-CM | POA: Diagnosis not present

## 2020-06-13 DIAGNOSIS — Z515 Encounter for palliative care: Secondary | ICD-10-CM

## 2020-06-13 DIAGNOSIS — M353 Polymyalgia rheumatica: Secondary | ICD-10-CM | POA: Diagnosis not present

## 2020-06-15 DIAGNOSIS — G2119 Other drug induced secondary parkinsonism: Secondary | ICD-10-CM | POA: Diagnosis not present

## 2020-06-15 DIAGNOSIS — E1122 Type 2 diabetes mellitus with diabetic chronic kidney disease: Secondary | ICD-10-CM | POA: Diagnosis not present

## 2020-06-15 DIAGNOSIS — I1310 Hypertensive heart and chronic kidney disease without heart failure, with stage 1 through stage 4 chronic kidney disease, or unspecified chronic kidney disease: Secondary | ICD-10-CM | POA: Diagnosis not present

## 2020-06-15 DIAGNOSIS — M353 Polymyalgia rheumatica: Secondary | ICD-10-CM | POA: Diagnosis not present

## 2020-06-15 DIAGNOSIS — E1142 Type 2 diabetes mellitus with diabetic polyneuropathy: Secondary | ICD-10-CM | POA: Diagnosis not present

## 2020-06-15 DIAGNOSIS — T43595D Adverse effect of other antipsychotics and neuroleptics, subsequent encounter: Secondary | ICD-10-CM | POA: Diagnosis not present

## 2020-06-15 NOTE — Progress Notes (Signed)
COMMUNITY PALLIATIVE CARE SW NOTE  PATIENT NAME: [name] DOB: 06-01-1934 MRN: [id number]  PRIMARY CARE PROVIDER: Shon Baton, MD  RESPONSIBLE PARTY:  Acct ID - Guarantor Home Phone Work Phone Relationship Acct Type  [id number] OLA, RAAP [phone]  Self P/F     [place], [name], [place] [address]   Due to the COVID-19 crisis, this virtual check-in visit was done via telephone from my office and it was initiated and consent by this patient and or family.  PLAN OF CARE and INTERVENTIONS:             1. GOALS OF CARE/ ADVANCE CARE PLANNING:  Goal is for patient to continue to live in her home. Patient is a DNR, form in the home.  2. SOCIAL/EMOTIONAL/SPIRITUAL ASSESSMENT/ INTERVENTIONS:  SW completed a telephonic visit with patient's son-[name] to provide support to him and obtain a status update on patient. [name] reported that patient's condition he feels is declining. She continues to receive physical therapy through [facility], but only has two weeks left. However, patient's mobility has significantly declined overall. She is at least a 1-2 person assist. He stated th he was informed that patient will be loosing a supportive agency and he worries that patient will surely decline more. [name] report that her appetite remains fair. She is having increased confusion and forgetfulness. She remains dependent for personal care needs. [name] expressed concerns regarding patient's finances as he anticipates increased care needs and medications. SW provided supportive counseling, active listening, assessment of needs and comfort, and reassurance of support. [name] remains receptive to ongoing palliative care support. 3. PATIENT/CAREGIVER EDUCATION/ COPING:  Patient and PCG appears to be coping well.  4. PERSONAL EMERGENCY PLAN:  911 can be activated for emergencies. 5. COMMUNITY RESOURCES COORDINATION/ HEALTH CARE NAVIGATION:  Patient is receiving physical therapy through  [facility]. 6. FINANCIAL/LEGAL CONCERNS/INTERVENTIONS:  [facility] continues to express financial concerns, but is managing patient's needs.      SOCIAL HX:  Social History   Tobacco Use  . Smoking status: Former Smoker    Quit date: 11/02/2004    Years since quitting: 15.6  . Smokeless tobacco: Never Used  Substance Use Topics  . Alcohol use: No    CODE STATUS: DNR ADVANCED DIRECTIVES: Yes MOST FORM COMPLETE:  Yes HOSPICE EDUCATION PROVIDED: No  PPS: Patientis having decreasedambulation and unsteady gait. Sheis having increased forgetfulness and confusion.Patientremains dependent for ADLs.   Duration oftelephonicvisit and documentation34minutes      [employer], LCSW

## 2020-06-17 DIAGNOSIS — R0902 Hypoxemia: Secondary | ICD-10-CM | POA: Diagnosis not present

## 2020-06-17 DIAGNOSIS — R269 Unspecified abnormalities of gait and mobility: Secondary | ICD-10-CM | POA: Diagnosis not present

## 2020-06-17 DIAGNOSIS — G4733 Obstructive sleep apnea (adult) (pediatric): Secondary | ICD-10-CM | POA: Diagnosis not present

## 2020-06-17 DIAGNOSIS — J449 Chronic obstructive pulmonary disease, unspecified: Secondary | ICD-10-CM | POA: Diagnosis not present

## 2020-06-20 DIAGNOSIS — M353 Polymyalgia rheumatica: Secondary | ICD-10-CM | POA: Diagnosis not present

## 2020-06-20 DIAGNOSIS — T43595D Adverse effect of other antipsychotics and neuroleptics, subsequent encounter: Secondary | ICD-10-CM | POA: Diagnosis not present

## 2020-06-20 DIAGNOSIS — E1142 Type 2 diabetes mellitus with diabetic polyneuropathy: Secondary | ICD-10-CM | POA: Diagnosis not present

## 2020-06-20 DIAGNOSIS — G2119 Other drug induced secondary parkinsonism: Secondary | ICD-10-CM | POA: Diagnosis not present

## 2020-06-20 DIAGNOSIS — E1122 Type 2 diabetes mellitus with diabetic chronic kidney disease: Secondary | ICD-10-CM | POA: Diagnosis not present

## 2020-06-20 DIAGNOSIS — I1310 Hypertensive heart and chronic kidney disease without heart failure, with stage 1 through stage 4 chronic kidney disease, or unspecified chronic kidney disease: Secondary | ICD-10-CM | POA: Diagnosis not present

## 2020-06-23 DIAGNOSIS — E559 Vitamin D deficiency, unspecified: Secondary | ICD-10-CM | POA: Diagnosis not present

## 2020-06-23 DIAGNOSIS — I4891 Unspecified atrial fibrillation: Secondary | ICD-10-CM | POA: Diagnosis not present

## 2020-06-23 DIAGNOSIS — D692 Other nonthrombocytopenic purpura: Secondary | ICD-10-CM | POA: Diagnosis not present

## 2020-06-23 DIAGNOSIS — N1831 Chronic kidney disease, stage 3a: Secondary | ICD-10-CM | POA: Diagnosis not present

## 2020-06-23 DIAGNOSIS — T43595D Adverse effect of other antipsychotics and neuroleptics, subsequent encounter: Secondary | ICD-10-CM | POA: Diagnosis not present

## 2020-06-23 DIAGNOSIS — G4733 Obstructive sleep apnea (adult) (pediatric): Secondary | ICD-10-CM | POA: Diagnosis not present

## 2020-06-23 DIAGNOSIS — M810 Age-related osteoporosis without current pathological fracture: Secondary | ICD-10-CM | POA: Diagnosis not present

## 2020-06-23 DIAGNOSIS — F0150 Vascular dementia without behavioral disturbance: Secondary | ICD-10-CM | POA: Diagnosis not present

## 2020-06-23 DIAGNOSIS — E1122 Type 2 diabetes mellitus with diabetic chronic kidney disease: Secondary | ICD-10-CM | POA: Diagnosis not present

## 2020-06-23 DIAGNOSIS — I1310 Hypertensive heart and chronic kidney disease without heart failure, with stage 1 through stage 4 chronic kidney disease, or unspecified chronic kidney disease: Secondary | ICD-10-CM | POA: Diagnosis not present

## 2020-06-23 DIAGNOSIS — E785 Hyperlipidemia, unspecified: Secondary | ICD-10-CM | POA: Diagnosis not present

## 2020-06-23 DIAGNOSIS — I422 Other hypertrophic cardiomyopathy: Secondary | ICD-10-CM | POA: Diagnosis not present

## 2020-06-23 DIAGNOSIS — G2119 Other drug induced secondary parkinsonism: Secondary | ICD-10-CM | POA: Diagnosis not present

## 2020-06-23 DIAGNOSIS — E1142 Type 2 diabetes mellitus with diabetic polyneuropathy: Secondary | ICD-10-CM | POA: Diagnosis not present

## 2020-06-23 DIAGNOSIS — I452 Bifascicular block: Secondary | ICD-10-CM | POA: Diagnosis not present

## 2020-06-23 DIAGNOSIS — R29898 Other symptoms and signs involving the musculoskeletal system: Secondary | ICD-10-CM | POA: Diagnosis not present

## 2020-06-23 DIAGNOSIS — J449 Chronic obstructive pulmonary disease, unspecified: Secondary | ICD-10-CM | POA: Diagnosis not present

## 2020-06-23 DIAGNOSIS — M1711 Unilateral primary osteoarthritis, right knee: Secondary | ICD-10-CM | POA: Diagnosis not present

## 2020-06-23 DIAGNOSIS — I69398 Other sequelae of cerebral infarction: Secondary | ICD-10-CM | POA: Diagnosis not present

## 2020-06-23 DIAGNOSIS — H547 Unspecified visual loss: Secondary | ICD-10-CM | POA: Diagnosis not present

## 2020-06-23 DIAGNOSIS — F3341 Major depressive disorder, recurrent, in partial remission: Secondary | ICD-10-CM | POA: Diagnosis not present

## 2020-06-23 DIAGNOSIS — I081 Rheumatic disorders of both mitral and tricuspid valves: Secondary | ICD-10-CM | POA: Diagnosis not present

## 2020-06-23 DIAGNOSIS — E538 Deficiency of other specified B group vitamins: Secondary | ICD-10-CM | POA: Diagnosis not present

## 2020-06-23 DIAGNOSIS — M353 Polymyalgia rheumatica: Secondary | ICD-10-CM | POA: Diagnosis not present

## 2020-06-23 DIAGNOSIS — M5136 Other intervertebral disc degeneration, lumbar region: Secondary | ICD-10-CM | POA: Diagnosis not present

## 2020-06-25 DIAGNOSIS — F3181 Bipolar II disorder: Secondary | ICD-10-CM | POA: Diagnosis not present

## 2020-06-25 DIAGNOSIS — F0390 Unspecified dementia without behavioral disturbance: Secondary | ICD-10-CM | POA: Diagnosis not present

## 2020-06-27 DIAGNOSIS — E1142 Type 2 diabetes mellitus with diabetic polyneuropathy: Secondary | ICD-10-CM | POA: Diagnosis not present

## 2020-06-27 DIAGNOSIS — G2119 Other drug induced secondary parkinsonism: Secondary | ICD-10-CM | POA: Diagnosis not present

## 2020-06-27 DIAGNOSIS — T43595D Adverse effect of other antipsychotics and neuroleptics, subsequent encounter: Secondary | ICD-10-CM | POA: Diagnosis not present

## 2020-06-27 DIAGNOSIS — I1310 Hypertensive heart and chronic kidney disease without heart failure, with stage 1 through stage 4 chronic kidney disease, or unspecified chronic kidney disease: Secondary | ICD-10-CM | POA: Diagnosis not present

## 2020-06-27 DIAGNOSIS — M353 Polymyalgia rheumatica: Secondary | ICD-10-CM | POA: Diagnosis not present

## 2020-06-27 DIAGNOSIS — E1122 Type 2 diabetes mellitus with diabetic chronic kidney disease: Secondary | ICD-10-CM | POA: Diagnosis not present

## 2020-07-01 DIAGNOSIS — R350 Frequency of micturition: Secondary | ICD-10-CM | POA: Diagnosis not present

## 2020-07-01 DIAGNOSIS — N3946 Mixed incontinence: Secondary | ICD-10-CM | POA: Diagnosis not present

## 2020-07-02 ENCOUNTER — Other Ambulatory Visit: Payer: Medicare Other | Admitting: *Deleted

## 2020-07-02 ENCOUNTER — Other Ambulatory Visit: Payer: Self-pay

## 2020-07-02 VITALS — BP 106/73 | HR 71 | Temp 97.9°F | Resp 16

## 2020-07-02 DIAGNOSIS — T43595D Adverse effect of other antipsychotics and neuroleptics, subsequent encounter: Secondary | ICD-10-CM | POA: Diagnosis not present

## 2020-07-02 DIAGNOSIS — E1122 Type 2 diabetes mellitus with diabetic chronic kidney disease: Secondary | ICD-10-CM | POA: Diagnosis not present

## 2020-07-02 DIAGNOSIS — M353 Polymyalgia rheumatica: Secondary | ICD-10-CM | POA: Diagnosis not present

## 2020-07-02 DIAGNOSIS — I1310 Hypertensive heart and chronic kidney disease without heart failure, with stage 1 through stage 4 chronic kidney disease, or unspecified chronic kidney disease: Secondary | ICD-10-CM | POA: Diagnosis not present

## 2020-07-02 DIAGNOSIS — G2119 Other drug induced secondary parkinsonism: Secondary | ICD-10-CM | POA: Diagnosis not present

## 2020-07-02 DIAGNOSIS — E1142 Type 2 diabetes mellitus with diabetic polyneuropathy: Secondary | ICD-10-CM | POA: Diagnosis not present

## 2020-07-02 DIAGNOSIS — Z515 Encounter for palliative care: Secondary | ICD-10-CM

## 2020-07-03 ENCOUNTER — Other Ambulatory Visit: Payer: Self-pay | Admitting: *Deleted

## 2020-07-03 NOTE — Patient Outreach (Signed)
[facility]) Care Management  07/03/2020  CHANDANI ROGOWSKI 06/17/1934 [id number]   Telephone Assessment-Successful-COPD  Spoke with son-POA [name] who provided an update on pt's ongoing care and agency involvement. Reports pt continue to do well on the current plan of care. States pt continue to have involvement with the following agencies: Palliative ([facility]), [facility] and currently [facility] but will be changing to [place] for O2 supplies on DME.  States the pending ramp to the home has been completed and actively being used and [employer] care (primary provider)in working on a project for possibly having a nurse visit in the home. No additional immediate needs as pt continue to do well on the review and discussed plan of care. Updates within the plan of care noted and confirmed pt is no longer using inhalers or nebulizer with good breathing just using her home oxygen.   Will follow up next month and update pt's provider on pt's progress for quarterly next month. No additional requested and all inquires and questions addressed during this call.   Goals Addressed            This Visit's Progress   . THN-Disease Progression Minimized or Managed   On track    Follow up Date 08/01/2020 Timeframe:  Long-Range Goal Priority:  Medium Start Date:   04/04/2020                  Expected End Date:    10/22/2020                     Evidence-based guidance:   Identify current smoking/tobacco use; provide smoking cessation intervention.   Assess symptom control by the frequency and type of symptoms, reliever use and activity limitation at every encounter.   Assess risk for exacerbation (flare up) by evaluating spirometry, pulse oximetry, reliever use, presentation of symptoms and activity limitation; anticipate treatment adjustment based on risks and resources.   Develop and/or review and reinforce use of COPD rescue (action) plan even when symptoms are controlled or  infrequent.   Ask patient to bring inhaler to all visits; assess and reinforce correct technique; address barriers to proper inhaler use, such as older age, use of multiple devices and lack of understanding.    Identify symptom triggers, such as smoking, virus, weather change, emotional upset, exercise, obesity and environmental allergen; consider reduction of work-exposure versus elimination to avoid compromising employment.   Correlate presentation to comorbidity, such as diabetes, heart failure, obstructive sleep apnea, depression and anxiety, which may worsen symptoms.   Prepare for individualized pharmacologic therapy that may include LABA (long-acting beta-2 agonist), LAMA (long-acting muscarinic antagonist), SABA (short-acting beta-2 agonist) oral or inhaled corticosteroid.   Promote participation in pulmonary rehabilitation for breathing exercises, skills training, improved exercise capacity, mood and quality of life; address barriers to participation.   Promote physical activity or exercise to improve or maintain exercise capacity, based on tolerance that may include walking, water exercise, cycling or limb muscle strength training.   Promote use of energy conservation and activity pacing techniques.   Promote use of breathing and coughing techniques, such as inspiratory muscle training, pursed-lip breathing, diaphragmatic breathing, pranayama yoga breathing or huff cough.   Screen for malnutrition risk factors, such as unintentional weight loss and poor oral intake; refer to dietitian if identified.   Consider recommendation for oral drink supplement or multivitamin and mineral supplements if suspect inadequate oral intake or micronutrient deficiencies.    Screen for obstructive sleep  apnea; prepare patient for polysomnography based on risk and presentation.   Prepare patient for use of long-term oxygen and noninvasive ventilation to relieve hypercapnia, hypoxemia, obstructive sleep  apnea and reduce work of breathing.   Prepare patient with worsening disease for surgical interventions that may include bronchoscopy, lung volume reduction surgery, bullectomy or lung transplantation.  Barriers: Health Behaviors   Notes:  5/12-POA son provided update on pt's ongoing care with involved services. Reports [facility] PT attempted to extend X 4 more weeks, Ramp has bee build onto the home and now completed for pt's use, Palliative ([facility]) continue involvement with a home visit yesterday and the request to change one of the pt's medication was recommended by a psychiatrist completed as pt doing well with no additional issues.  Feb-Based upon pt's needs several agencies are involved with her care. [facility] 2 more visits remain, [facility] and pending [place] for building a ramp for this pt. Currently awaiting funds to process. Discuss minimizing visitors that maybe sick to lower the risk for possible infection. Son verbalizes the importance of all discussed today.     Baker Pierini and Manage My Symptoms   On track    Follow Up Date 08/01/2009 Timeframe:  Long-Range Goal Priority:  Medium Start Date:     03/04/2020                        Expected End Date:    09/19/2020                     - develop a rescue plan - eliminate symptom triggers at home - follow rescue plan if symptoms flare-up - keep follow-up appointments    Why is this important?   Tracking your symptoms and other information about your health helps your doctor plan your care.  Write down the symptoms, the time of day, what you were doing and what medicine you are taking.  You will soon learn how to manage your symptoms.    Barriers: Health Behaviors  Notes: 4/13-Pt continue to do well and has not used her emergence inhalers and continue to use her home O2 consistently with no reported system issues. However son [name] has indicated changing oxygen supplier and requested resources. Resources  were supplied on today's call for other O2 suppliers in the area ([place], [place] and [facility]). 3/10-Caregiver reports pt doing well however recent partial fall as pt remains weak. States he is assisting pt more each day due to her ongoing weakness. [facility] just finished working with pt for PT with plans for pt to have PT once again after 30-60 days clearance for [facility] to approve once again. [facility] RN visited with rhonic noted and portable X-ray indicated pneumonia. Pt now on 10 days of antibiotics (today #7). Pt remains on Home O2 #3 liters with good readings. FEB- Caregiver son [name]) reports [name] will be ending after 2 additional visits and he would like to continue some form of therapy. Encouraged son to inquire with [facility] since they remain involved for weekly PT services. Note they have provided this services in the past.   [facility] remains involved with PT services along with [facility] for weekly PT. Home O2 with [facility]. Will extend to allow ongoing adherence with pt's management of care. Pt currently pending appointment with neurologist and PCP on 1/13. Verified no acute symptoms over the past month with pt's COPD.  Raina Mina, RN Care Management Coordinator [place] Office [phone]

## 2020-07-05 DIAGNOSIS — M353 Polymyalgia rheumatica: Secondary | ICD-10-CM | POA: Diagnosis not present

## 2020-07-05 DIAGNOSIS — E1122 Type 2 diabetes mellitus with diabetic chronic kidney disease: Secondary | ICD-10-CM | POA: Diagnosis not present

## 2020-07-05 DIAGNOSIS — E1142 Type 2 diabetes mellitus with diabetic polyneuropathy: Secondary | ICD-10-CM | POA: Diagnosis not present

## 2020-07-05 DIAGNOSIS — G2119 Other drug induced secondary parkinsonism: Secondary | ICD-10-CM | POA: Diagnosis not present

## 2020-07-05 DIAGNOSIS — T43595D Adverse effect of other antipsychotics and neuroleptics, subsequent encounter: Secondary | ICD-10-CM | POA: Diagnosis not present

## 2020-07-05 DIAGNOSIS — I1310 Hypertensive heart and chronic kidney disease without heart failure, with stage 1 through stage 4 chronic kidney disease, or unspecified chronic kidney disease: Secondary | ICD-10-CM | POA: Diagnosis not present

## 2020-07-10 DIAGNOSIS — T43595D Adverse effect of other antipsychotics and neuroleptics, subsequent encounter: Secondary | ICD-10-CM | POA: Diagnosis not present

## 2020-07-10 DIAGNOSIS — I1310 Hypertensive heart and chronic kidney disease without heart failure, with stage 1 through stage 4 chronic kidney disease, or unspecified chronic kidney disease: Secondary | ICD-10-CM | POA: Diagnosis not present

## 2020-07-10 DIAGNOSIS — E1122 Type 2 diabetes mellitus with diabetic chronic kidney disease: Secondary | ICD-10-CM | POA: Diagnosis not present

## 2020-07-10 DIAGNOSIS — G2119 Other drug induced secondary parkinsonism: Secondary | ICD-10-CM | POA: Diagnosis not present

## 2020-07-10 DIAGNOSIS — E1142 Type 2 diabetes mellitus with diabetic polyneuropathy: Secondary | ICD-10-CM | POA: Diagnosis not present

## 2020-07-10 DIAGNOSIS — M353 Polymyalgia rheumatica: Secondary | ICD-10-CM | POA: Diagnosis not present

## 2020-07-13 DIAGNOSIS — M353 Polymyalgia rheumatica: Secondary | ICD-10-CM | POA: Diagnosis not present

## 2020-07-13 DIAGNOSIS — G2119 Other drug induced secondary parkinsonism: Secondary | ICD-10-CM | POA: Diagnosis not present

## 2020-07-13 DIAGNOSIS — E1122 Type 2 diabetes mellitus with diabetic chronic kidney disease: Secondary | ICD-10-CM | POA: Diagnosis not present

## 2020-07-13 DIAGNOSIS — I1310 Hypertensive heart and chronic kidney disease without heart failure, with stage 1 through stage 4 chronic kidney disease, or unspecified chronic kidney disease: Secondary | ICD-10-CM | POA: Diagnosis not present

## 2020-07-13 DIAGNOSIS — E1142 Type 2 diabetes mellitus with diabetic polyneuropathy: Secondary | ICD-10-CM | POA: Diagnosis not present

## 2020-07-13 DIAGNOSIS — T43595D Adverse effect of other antipsychotics and neuroleptics, subsequent encounter: Secondary | ICD-10-CM | POA: Diagnosis not present

## 2020-07-15 NOTE — Progress Notes (Signed)
COMMUNITY PALLIATIVE CARE RN NOTE  PATIENT NAME: [name] DOB: [birth year] MRN: [id number]  PRIMARY CARE PROVIDER: Shon Baton, MD  RESPONSIBLE PARTY: [name] (son) Acct ID - Guarantor Home Phone Work Phone Relationship Acct Type  [id number] [name], [name] [phone]  Self P/F     [name] [place], [name], [name] [phone]   Covid-19 Pre-screening Negative  PLAN OF CARE and INTERVENTION:  1. ADVANCE CARE PLANNING/GOALS OF CARE: Goal is for patient to remain in her home and avoid hospitalizations. She has a DNR. 2. PATIENT/CAREGIVER EDUCATION: Symptom management, pain management, safe mobility/transfers, s/s of infection 3. DISEASE STATUS: Face-to-face visit completed in patient's home. Son, [name] is present during visit. She is sitting up in her recliner awake and alert. Pleasant mood and able to answer questions appropriately. She does have some mild dementia. Patient denies pain at this time, but does experience pain in her knees with standing and ambulation. She uses Aspercreme to help. She continues to require assistance with standing and stand-by assistance during ambulation. She has not had any falls over the past month. She also requires assistance with bathing, dressing and toileting. She remains able to feed herself independently. She had PT today through [name], who comes 2x/week to help patient to maintain her current level of functioning. They are in the process of trying to get this extended for 4 more weeks if possible. She says they worked on ambulating to the bathroom using her walker and performing some seated leg exercises. She says she is tired after this session, but feels that they are helping. She is transported some via wheelchair, as when she has to use the bathroom she is unable to walk quick enough down the hall without having an accident. She is intermittently incontinent of both bowel and bladder and wears adult briefs. She is on Macrodantin daily for UTI  prophylaxis. She has a good appetite. She had a recent appointment with a Psychiatrist since her [name] was changed to Seroquel. Son says one purpose of this change in medication was to see if this could help improve patient's balance, however both patient and son feel it has made her more imbalanced and unsteady. She is sleeping good throughout the night. She takes naps during the day. They were able to get a ramp built of the home and is pleased with this. Will continue to monitor.   HISTORY OF PRESENT ILLNESS: This is a 86 yo female with a diagnosis of dementia without behavioral disturbances. She has a h/o cerebellar stroke, TIAs, DM, ataxia and generalized weakness. Palliative carecontinues to follow patient andvisitsmonthly and PRN   CODE STATUS: DNR ADVANCED DIRECTIVES: Y MOST FORM: yes PPS: 30%   PHYSICAL EXAM:   VITALS: Today's Vitals   07/02/20 1446  BP: 106/73  Pulse: 71  Resp: 16  Temp: 97.9 F (36.6 C)  TempSrc: Temporal  SpO2: 90%  PainSc: 0-No pain    LUNGS: clear to auscultation  CARDIAC: Cor RRR EXTREMITIES: No edema SKIN: Bruise noted on R hand, thin/frail skin  NEURO: Alert and oriented x 2, intermittent confusion/forgetfulness, ambulatory w/walker   (Duration of visit and documentation 75 minutes)   T[facility]ara [facility], RN, BSN

## 2020-07-17 DIAGNOSIS — T43595D Adverse effect of other antipsychotics and neuroleptics, subsequent encounter: Secondary | ICD-10-CM | POA: Diagnosis not present

## 2020-07-17 DIAGNOSIS — E1142 Type 2 diabetes mellitus with diabetic polyneuropathy: Secondary | ICD-10-CM | POA: Diagnosis not present

## 2020-07-17 DIAGNOSIS — G2119 Other drug induced secondary parkinsonism: Secondary | ICD-10-CM | POA: Diagnosis not present

## 2020-07-17 DIAGNOSIS — I1310 Hypertensive heart and chronic kidney disease without heart failure, with stage 1 through stage 4 chronic kidney disease, or unspecified chronic kidney disease: Secondary | ICD-10-CM | POA: Diagnosis not present

## 2020-07-17 DIAGNOSIS — E1122 Type 2 diabetes mellitus with diabetic chronic kidney disease: Secondary | ICD-10-CM | POA: Diagnosis not present

## 2020-07-17 DIAGNOSIS — M353 Polymyalgia rheumatica: Secondary | ICD-10-CM | POA: Diagnosis not present

## 2020-07-22 DIAGNOSIS — I1310 Hypertensive heart and chronic kidney disease without heart failure, with stage 1 through stage 4 chronic kidney disease, or unspecified chronic kidney disease: Secondary | ICD-10-CM | POA: Diagnosis not present

## 2020-07-22 DIAGNOSIS — T43595D Adverse effect of other antipsychotics and neuroleptics, subsequent encounter: Secondary | ICD-10-CM | POA: Diagnosis not present

## 2020-07-22 DIAGNOSIS — G2119 Other drug induced secondary parkinsonism: Secondary | ICD-10-CM | POA: Diagnosis not present

## 2020-07-22 DIAGNOSIS — E1142 Type 2 diabetes mellitus with diabetic polyneuropathy: Secondary | ICD-10-CM | POA: Diagnosis not present

## 2020-07-22 DIAGNOSIS — E1122 Type 2 diabetes mellitus with diabetic chronic kidney disease: Secondary | ICD-10-CM | POA: Diagnosis not present

## 2020-07-22 DIAGNOSIS — M353 Polymyalgia rheumatica: Secondary | ICD-10-CM | POA: Diagnosis not present

## 2020-08-01 ENCOUNTER — Other Ambulatory Visit: Payer: Self-pay | Admitting: *Deleted

## 2020-08-01 NOTE — Patient Outreach (Signed)
[place] [facility]) Care Management  08/01/2020  CARAGH GASPER 1934/04/15 [id number]   Telephone Assessment-Successful-COPD  RN spoke with the pt's POA son Dellis Filbert who indicates pt was doing well with no acute issues over he last month. States [facility] recent ended services however this is an ongoing process where the pt must await 30 days to resume PT services or have a fall for immediate services to restart. At that time pt's provider will resubmit orders for HHPT.  Palliative remains involved, ramp has is now in place to the home residence and psychologist has changed the pt's medications to accommodate her ongoing needs. No immediate needs presented at this time. Plan of care reviewed and discussed with noted changes within the plan of care along with all goals and interventions. Pt pending office visit with provider as requested for additional services but POA not sure what services or agency this will involved. Pt has been awaiting this task over the last month with no appointment arranged yet. Will update provider on pt's disposition with [facility] services at this time.                                                                                                                                                  Goals Addressed             This Visit's Progress    THN-Disease Progression Minimized or Managed   On track    Follow up Date 09/05/2020 Timeframe:  Long-Range Goal Priority:  Medium Start Date:   04/04/2020                  Expected End Date:    10/22/2020                     Evidence-based guidance:  Identify current smoking/tobacco use; provide smoking cessation intervention.  Assess symptom control by the frequency and type of symptoms, reliever use and activity limitation at every encounter.  Assess risk for exacerbation (flare up) by evaluating spirometry, pulse oximetry, reliever use, presentation of symptoms and activity limitation; anticipate treatment  adjustment based on risks and resources.  Develop and/or review and reinforce use of COPD rescue (action) plan even when symptoms are controlled or infrequent.  Ask patient to bring inhaler to all visits; assess and reinforce correct technique; address barriers to proper inhaler use, such as older age, use of multiple devices and lack of understanding.   Identify symptom triggers, such as smoking, virus, weather change, emotional upset, exercise, obesity and environmental allergen; consider reduction of work-exposure versus elimination to avoid compromising employment.  Correlate presentation to comorbidity, such as diabetes, heart failure, obstructive sleep apnea, depression and anxiety, which may worsen symptoms.  Prepare for individualized pharmacologic therapy that may include LABA (long-acting beta-2 agonist), LAMA (long-acting muscarinic antagonist), SABA (short-acting beta-2  agonist) oral or inhaled corticosteroid.  Promote participation in pulmonary rehabilitation for breathing exercises, skills training, improved exercise capacity, mood and quality of life; address barriers to participation.  Promote physical activity or exercise to improve or maintain exercise capacity, based on tolerance that may include walking, water exercise, cycling or limb muscle strength training.  Promote use of energy conservation and activity pacing techniques.  Promote use of breathing and coughing techniques, such as inspiratory muscle training, pursed-lip breathing, diaphragmatic breathing, pranayama yoga breathing or huff cough.  Screen for malnutrition risk factors, such as unintentional weight loss and poor oral intake; refer to dietitian if identified.  Consider recommendation for oral drink supplement or multivitamin and mineral supplements if suspect inadequate oral intake or micronutrient deficiencies.   Screen for obstructive sleep apnea; prepare patient for polysomnography based on risk and presentation.   Prepare patient for use of long-term oxygen and noninvasive ventilation to relieve hypercapnia, hypoxemia, obstructive sleep apnea and reduce work of breathing.  Prepare patient with worsening disease for surgical interventions that may include bronchoscopy, lung volume reduction surgery, bullectomy or lung transplantation.  Barriers: Health Behaviors   Notes:  5/12-POA son provided update on pt's ongoing care with involved services. Reports [facility] PT attempted to extend X 4 more weeks, Ramp has bee build onto the home and now completed for pt's use, Palliative ([facility]) continue involvement with a home visit yesterday and the request to change one of the pt's medication was recommended by a psychiatrist completed as pt doing well with no additional issues.  Feb-Based upon pt's needs several agencies are involved with her care. [facility] 2 more visits remain, [facility] and pending [place] for building a ramp for this pt. Currently awaiting funds to process. Discuss minimizing visitors that maybe sick to lower the risk for possible infection. Son verbalizes the importance of all discussed today.       THN-Track and Manage My Symptoms   On track    Follow Up Date 09/05/2020 Timeframe:  Long-Range Goal Priority:  Medium Start Date:     03/04/2020                        Expected End Date:    09/19/2020                     - develop a rescue plan - eliminate symptom triggers at home - follow rescue plan if symptoms flare-up - keep follow-up appointments    Why is this important?   Tracking your symptoms and other information about your health helps your doctor plan your care.  Write down the symptoms, the time of day, what you were doing and what medicine you are taking.  You will soon learn how to manage your symptoms.    Barriers: Health Behaviors  Notes: 4/13-Pt continue to do well and has not used her emergence inhalers and continue to use her home O2 consistently  with no reported system issues. However son [name] has indicated changing oxygen supplier and requested resources. Resources were supplied on today's call for other O2 suppliers in the area ([place], [place] and [facility]). 3/10-Caregiver reports pt doing well however recent partial fall as pt remains weak. States he is assisting pt more each day due to her ongoing weakness. [facility] PT just finished working with pt for PT with plans for pt to have PT once again after 30-60 days clearance for [facility] to approve once again. [facility] RN visited with rhonic noted and portable X-ray indicated pneumonia. Pt now on 10 days of antibiotics (today #7). Pt remains on Home O2 #3 liters with good readings. FEB- Caregiver son [name]) reports [name] will be ending after 2 additional visits and he would like to continue some form of therapy. Encouraged son to inquire with [facility] since they remain involved for weekly PT services. Note they have provided this services in the past.   [facility] remains involved with PT services along with [facility] for weekly PT. Home O2 with [employer]. Will extend to allow ongoing adherence with pt's management of care. Pt currently pending appointment with neurologist and PCP on 1/13. Verified no acute symptoms over the past month with pt's COPD.          Raina Mina, RN Care Management Coordinator [place] Office [phone]

## 2020-08-07 ENCOUNTER — Telehealth: Payer: Self-pay | Admitting: *Deleted

## 2020-08-07 NOTE — Telephone Encounter (Signed)
Called and spoke with patient's son, Dellis Filbert, to schedule a palliative care home visit. Visit scheduled for 6/21@2p .

## 2020-08-12 ENCOUNTER — Other Ambulatory Visit: Payer: Self-pay

## 2020-08-12 ENCOUNTER — Other Ambulatory Visit: Payer: Medicare Other | Admitting: *Deleted

## 2020-08-12 VITALS — BP 147/98 | HR 61 | Temp 97.6°F | Resp 17

## 2020-08-12 DIAGNOSIS — Z515 Encounter for palliative care: Secondary | ICD-10-CM

## 2020-08-12 NOTE — Progress Notes (Signed)
COMMUNITY PALLIATIVE CARE RN NOTE  PATIENT NAME: [name] DOB: [birth year] MRN: [id number]  PRIMARY CARE PROVIDER: Shon Baton, MD  RESPONSIBLE PARTY: Philis Fendt (son) Acct ID - Guarantor Home Phone Work Phone Relationship Acct Type  [id number] RIYAH, BARDON [phone]  Self P/F     [address] [name], [place] [address]   Covid-19 Prescreening Negative  PLAN OF CARE and INTERVENTION:  ADVANCE CARE PLANNING/GOALS OF CARE: Goal is for patient to remain in her home with son as caretaker. She has a DNR. PATIENT/CAREGIVER EDUCATION: Symptom management, safe mobility/transfers, s/s of infection DISEASE STATUS: Face-to-face visit completed in patient's home. Son also lives with patient and is her caregiver. Upon arrival, patient is in the bathroom. Son has to transport patient to the bathroom as she would not make it in time if she uses her walker. She remains able to answer simple questions and make her needs known. She is forgetful at times. She denies pain at time of visit, but does continue to have pain in her knees. She uses topical analgesics to help. She is able to ambulate using a walker with 1 person assistance and needs assistance with standing, bathing, dressing and toileting. She is able to feed herself independently. She is no longer receiving PT as of 3 weeks ago, but her son tries to walk her up and down the hallways when possible and perform other exercises given to her by PT. Her appetite is good, but son has noticed a decrease from 100% of 2 meals per day to 75%. He is working on getting her to drink more fluids. She is incontinent of both bowel and bladder and wears Depends. He has placed a bidet in the bathroom to help cleanse patient more thoroughly after toileting. She is not sleeping as well during the night. Son had [facility] to come and pick up their oxygen concentrator and is currently shopping around for another one. She has been without one for the past 3  weeks. He has noticed that she has periods of apnea and can hear her loud snore when she starts breathing again. She is in turn napping more during the day. [facility] is scheduled to visit with patient this Thursday in order to start visiting with patient in the home regularly. She will however remain under the care of her current PCP, Dr. Virgina Jock. Son says that [name] is meeting with them on June 30th, as they have a pharmacy program where they will handle all of her refills and pre-package all medications, as well as have them delivered to the home at no additional cost. Will continue to monitor.   HISTORY OF PRESENT ILLNESS: This is a 86 yo female with a diagnosis of dementia without behavioral disturbances. She has a h/o cerebellar stroke, TIAs, DM, ataxia and generalized weakness. Palliative care continues to follow patient and visits monthly and PRN    CODE STATUS: DNR ADVANCED DIRECTIVES: Y MOST FORM: yes PPS: 30%   PHYSICAL EXAM:   VITALS: Today's Vitals   08/12/20 1439  BP: (!) 147/98  Pulse: 61  Resp: 17  Temp: 97.6 F (36.4 C)  TempSrc: Temporal  SpO2: 92%  PainSc: 0-No pain    LUNGS: clear to auscultation  CARDIAC: Cor RRR EXTREMITIES: No edema SKIN:  Thin/frail skin that bruises easily   NEURO:  Alert and oriented x 2 (person/place), intermittent confusion/forgetfulness, ambulatory w/walker and 1 person assistance    (Duration of visit and documentation 60 minutes)   Daryl Eastern,  RN BSN

## 2020-08-14 DIAGNOSIS — E785 Hyperlipidemia, unspecified: Secondary | ICD-10-CM | POA: Diagnosis not present

## 2020-08-14 DIAGNOSIS — I10 Essential (primary) hypertension: Secondary | ICD-10-CM | POA: Diagnosis not present

## 2020-08-14 DIAGNOSIS — I452 Bifascicular block: Secondary | ICD-10-CM | POA: Diagnosis not present

## 2020-08-14 DIAGNOSIS — M353 Polymyalgia rheumatica: Secondary | ICD-10-CM | POA: Diagnosis not present

## 2020-08-14 DIAGNOSIS — E119 Type 2 diabetes mellitus without complications: Secondary | ICD-10-CM | POA: Diagnosis not present

## 2020-08-14 DIAGNOSIS — F0390 Unspecified dementia without behavioral disturbance: Secondary | ICD-10-CM | POA: Diagnosis not present

## 2020-08-14 DIAGNOSIS — I639 Cerebral infarction, unspecified: Secondary | ICD-10-CM | POA: Diagnosis not present

## 2020-09-05 ENCOUNTER — Other Ambulatory Visit: Payer: Self-pay | Admitting: *Deleted

## 2020-09-05 DIAGNOSIS — D692 Other nonthrombocytopenic purpura: Secondary | ICD-10-CM | POA: Diagnosis not present

## 2020-09-05 DIAGNOSIS — M810 Age-related osteoporosis without current pathological fracture: Secondary | ICD-10-CM | POA: Diagnosis not present

## 2020-09-05 DIAGNOSIS — F0150 Vascular dementia without behavioral disturbance: Secondary | ICD-10-CM | POA: Diagnosis not present

## 2020-09-05 DIAGNOSIS — I129 Hypertensive chronic kidney disease with stage 1 through stage 4 chronic kidney disease, or unspecified chronic kidney disease: Secondary | ICD-10-CM | POA: Diagnosis not present

## 2020-09-05 DIAGNOSIS — I422 Other hypertrophic cardiomyopathy: Secondary | ICD-10-CM | POA: Diagnosis not present

## 2020-09-05 DIAGNOSIS — E785 Hyperlipidemia, unspecified: Secondary | ICD-10-CM | POA: Diagnosis not present

## 2020-09-05 DIAGNOSIS — J449 Chronic obstructive pulmonary disease, unspecified: Secondary | ICD-10-CM | POA: Diagnosis not present

## 2020-09-05 DIAGNOSIS — N1831 Chronic kidney disease, stage 3a: Secondary | ICD-10-CM | POA: Diagnosis not present

## 2020-09-05 DIAGNOSIS — R0902 Hypoxemia: Secondary | ICD-10-CM | POA: Diagnosis not present

## 2020-09-05 DIAGNOSIS — F3341 Major depressive disorder, recurrent, in partial remission: Secondary | ICD-10-CM | POA: Diagnosis not present

## 2020-09-05 DIAGNOSIS — E1140 Type 2 diabetes mellitus with diabetic neuropathy, unspecified: Secondary | ICD-10-CM | POA: Diagnosis not present

## 2020-09-05 DIAGNOSIS — E039 Hypothyroidism, unspecified: Secondary | ICD-10-CM | POA: Diagnosis not present

## 2020-09-15 ENCOUNTER — Other Ambulatory Visit: Payer: Medicare Other | Admitting: *Deleted

## 2020-09-15 ENCOUNTER — Other Ambulatory Visit: Payer: Self-pay

## 2020-09-15 DIAGNOSIS — Z515 Encounter for palliative care: Secondary | ICD-10-CM

## 2020-09-15 NOTE — Progress Notes (Signed)
[facility] PALLIATIVE C[facility] RN NOTE  PATIENT NAME: [name] DOB: 21-Dec-1934 MRN: [id number]  PRIMARY CARE PROVIDER: Shon Baton, MD  RESPONSIBLE PARTY: [name] (son) Acct ID - [place] Phone Work Phone Relationship Acct Type  [id number] [name], [name] [phone]  Self P/F     [place], [name], [name] [address]   Due to the COVID-19 crisis, this virtual check-in visit was done via telephone from my office and it was initiated and consent by this patient and or family.  PLAN OF CARE and INTERVENTION:  ADVANCE CARE PLANNING/GOALS OF CARE:  Goal is for patient to remain as stable as possible and avoid hospitalizations. PATIENT/CAREGIVER EDUCATION: Symptom management, safe mobility/transfers DISEASE STATUS: Virtual check-in visit completed via telephone. Patient remains alert and oriented to person/place and able to answer questions and make her needs known. She denies pain at this time. No issues with shortness of breath and no coughing noted. [name] states that she is able to ambulate some using her walker, however has been doing more sitting lately. He has been transporting her by pushing the rollator walker to the bathroom more regularly. She is unable to stand by herself. She continues to require 1 person assistance with all ADLs, but is able to feed herself independently. Her appetite is good, per son given her physical activity level. She was seen by her PCP a few weeks ago and she has lost some weight. [name] unsure as to how much but she is down to 163 lbs. He says her Amaryl and Plavix was discontinued. She continues on oxygen at 3L/min via [place] during the night. Will continue to monitor.   HISTORY OF PRESENT ILLNESS:  This is a 86 yo female with a diagnosis of dementia without behavioral disturbances. She has a h/o cerebellar stroke, TIAs, DM, ataxia and generalized weakness. Palliative care continues to follow patient.  CODE STATUS: DNR  ADVANCED  DIRECTIVES: Y MOST FORM: yes PPS: 30%   (Duration of visit and documentation 30 minutes)   Tamara Eastern, RN BSN

## 2020-09-23 ENCOUNTER — Other Ambulatory Visit: Payer: Self-pay | Admitting: *Deleted

## 2020-09-23 NOTE — Patient Outreach (Signed)
[facility] [facility]) Care Management  09/23/2020  BERA DWELLE 1934/11/14 [id number]  Telephone Assessment-Unsuccessful  RN attempted outreach call today however unsuccessful. RN able to leave a HIPAA approved voice message requesting a call back.  RN will continue outreach call over the next week.  Raina Mina, RN Care Management Coordinator [place] Office [phone]

## 2020-10-02 ENCOUNTER — Other Ambulatory Visit: Payer: Self-pay | Admitting: *Deleted

## 2020-10-02 NOTE — Patient Outreach (Signed)
[facility]) Care Management  10/02/2020  [name] 04-May-1934 [id number]   Outreach attempt #2, unsuccessful, HIPAA compliant voice message left.  Will send outreach letter and have assigned RNCM follow up within the next 3-5 business days.  Valente David, [place], MSN [place] [phone]

## 2020-10-03 ENCOUNTER — Ambulatory Visit: Payer: Self-pay | Admitting: *Deleted

## 2020-10-08 ENCOUNTER — Other Ambulatory Visit: Payer: Self-pay | Admitting: *Deleted

## 2020-10-08 NOTE — Patient Outreach (Signed)
[facility] [facility]) Care Management  10/08/2020  EMMILEE ORSI Feb 19, 1935 [id number]  Telephone Assessment-Successful-COPD  Spoke with pt's POA son [name]) who provided an update on pt's ongoing management of care. States the only services involved with pt at this time is [facility] who visits every other month and offers mobile x-rays if needed due to pt's history of pneumonia. Discussed the current plan of care and noted pt's progress and management of care within the plan and reiterated on pt's goals and interventions present to avoid risk of barriers. Continue to offer CSW or pharmacy consult as needed vis [facility] services. No other issues or needs presented at this time.  Will follow up next month with ongoing case management services.   Goals Addressed             This Visit's Progress    THN-Disease Progression Minimized or Managed   On track    Follow up Date 11/07/2020 Timeframe:  Long-Range Goal Priority:  Medium Start Date:   04/04/2020                  Expected End Date:    01/21/2021                     Evidence-based guidance:  Identify current smoking/tobacco use; provide smoking cessation intervention.  Assess symptom control by the frequency and type of symptoms, reliever use and activity limitation at every encounter.  Assess risk for exacerbation (flare up) by evaluating spirometry, pulse oximetry, reliever use, presentation of symptoms and activity limitation; anticipate treatment adjustment based on risks and resources.  Develop and/or review and reinforce use of COPD rescue (action) plan even when symptoms are controlled or infrequent.  Ask patient to bring inhaler to all visits; assess and reinforce correct technique; address barriers to proper inhaler use, such as older age, use of multiple devices and lack of understanding.   Identify symptom triggers, such as smoking, virus, weather change, emotional upset, exercise, obesity and environmental  allergen; consider reduction of work-exposure versus elimination to avoid compromising employment.  Correlate presentation to comorbidity, such as diabetes, heart failure, obstructive sleep apnea, depression and anxiety, which may worsen symptoms.  Prepare for individualized pharmacologic therapy that may include LABA (long-acting beta-2 agonist), LAMA (long-acting muscarinic antagonist), SABA (short-acting beta-2 agonist) oral or inhaled corticosteroid.  Promote participation in pulmonary rehabilitation for breathing exercises, skills training, improved exercise capacity, mood and quality of life; address barriers to participation.  Promote physical activity or exercise to improve or maintain exercise capacity, based on tolerance that may include walking, water exercise, cycling or limb muscle strength training.  Promote use of energy conservation and activity pacing techniques.  Promote use of breathing and coughing techniques, such as inspiratory muscle training, pursed-lip breathing, diaphragmatic breathing, pranayama yoga breathing or huff cough.  Screen for malnutrition risk factors, such as unintentional weight loss and poor oral intake; refer to dietitian if identified.  Consider recommendation for oral drink supplement or multivitamin and mineral supplements if suspect inadequate oral intake or micronutrient deficiencies.   Screen for obstructive sleep apnea; prepare patient for polysomnography based on risk and presentation.  Prepare patient for use of long-term oxygen and noninvasive ventilation to relieve hypercapnia, hypoxemia, obstructive sleep apnea and reduce work of breathing.  Prepare patient with worsening disease for surgical interventions that may include bronchoscopy, lung volume reduction surgery, bullectomy or lung transplantation.  Barriers: Health Behaviors   Notes:  8/17- Son [name]) indicates pt remains on  home O2 via 3 liters at night only and continue to do well. Pt  mostly home bound with assistance from his son. No other services other then [facility] involved at this time. States they are able to provide mobile x-rays if needed due to pt's history of pneumonia. 5/12-POA son provided update on pt's ongoing care with involved services. Reports [facility] PT attempted to extend X 4 more weeks, Ramp has bee build onto the home and now completed for pt's use, Palliative ([facility]) continue involvement with a home visit yesterday and the request to change one of the pt's medication was recommended by a psychiatrist completed as pt doing well with no additional issues.  Feb-Based upon pt's needs several agencies are involved with her care. [facility] 2 more visits remain, [facility] and pending [place] for building a ramp for this pt. Currently awaiting funds to process. Discuss minimizing visitors that maybe sick to lower the risk for possible infection. Son verbalizes the importance of all discussed today.      THN-Track and Manage My Symptoms   On track    Follow Up Date: 11/07/2020 Timeframe:  Short Term Priority:  Medium Start Date:     03/04/2020                        Expected End Date:    0/9/30/2022                     - develop a rescue plan - eliminate symptom triggers at home - follow rescue plan if symptoms flare-up - keep follow-up appointments    Why is this important?   Tracking your symptoms and other information about your health helps your doctor plan your care.  Write down the symptoms, the time of day, what you were doing and what medicine you are taking.  You will soon learn how to manage your symptoms.    Barriers: Health Behaviors  Notes: 8/17-Pt remains on home O2 via 3 liters at night only and continue to do well with no related symptoms and remains in the GREEN zone. Pt mostly home bound with assistance from his son. No other services other then [facility] involved at this time. 4/13-Pt continue to do well and  has not used her emergence inhalers and continue to use her home O2 consistently with no reported system issues. However son [name] has indicated changing oxygen supplier and requested resources. Resources were supplied on today's call for other O2 suppliers in the area ([place], [place] and [facility]). 3/10-Caregiver reports pt doing well however recent partial fall as pt remains weak. States he is assisting pt more each day due to her ongoing weakness. [facility] PT just finished working with pt for PT with plans for pt to have PT once again after 30-60 days clearance for [facility] to approve once again. [facility] RN visited with rhonic noted and portable X-ray indicated pneumonia. Pt now on 10 days of antibiotics (today #7). Pt remains on Home O2 #3 liters with good readings. FEB- Caregiver son [name]) reports [name] will be ending after 2 additional visits and he would like to continue some form of therapy. Encouraged son to inquire with [facility] since they remain involved for weekly PT services. Note they have provided this services in the past.   [facility] remains involved with PT services along with [facility] for weekly PT. Home O2 with [employer]. Will extend to allow ongoing adherence  with pt's management of care. Pt currently pending appointment with neurologist and PCP on 1/13. Verified no acute symptoms over the past month with pt's COPD.         Raina Mina, RN Care Management Coordinator [place] Office [phone]

## 2020-10-24 ENCOUNTER — Other Ambulatory Visit: Payer: Self-pay

## 2020-10-24 ENCOUNTER — Other Ambulatory Visit: Payer: Medicare Other | Admitting: *Deleted

## 2020-10-24 DIAGNOSIS — Z515 Encounter for palliative care: Secondary | ICD-10-CM

## 2020-10-28 ENCOUNTER — Other Ambulatory Visit: Payer: Self-pay

## 2020-10-28 ENCOUNTER — Other Ambulatory Visit: Payer: Medicare Other | Admitting: *Deleted

## 2020-10-28 VITALS — BP 172/113 | HR 59 | Temp 97.8°F | Resp 18

## 2020-10-28 DIAGNOSIS — Z515 Encounter for palliative care: Secondary | ICD-10-CM

## 2020-10-28 NOTE — Progress Notes (Signed)
[facility] RN NOTE  PATIENT NAME: [name] DOB: Sep 17, 1934 MRN: [id number]  PRIMARY CARE PROVIDER: Shon Baton, MD  RESPONSIBLE PARTY: Philis Fendt (son) Acct ID - Guarantor Home Phone Work Phone Relationship Acct Type  [id number] NATSUMI, WHITSITT [phone]  Self P/F     [place], [name], [place] [id number]   Covid-19 Pre-screening Negative  PLAN OF CARE and INTERVENTION:  ADVANCE CARE PLANNING/GOALS OF CARE: Goal is for patient to remain in her home with son as caregiver and maintain current level of functioning for as long as possible. PATIENT/CAREGIVER EDUCATION: Symptom management, skin breakdown management, safe mobility, fall prevention, s/s of infection DISEASE STATUS: RN visit requested by son. Met with patient and son in their home. Patient has some skin breakdown starting on the outer aspect of her heels bilaterally. She does have occasional pain and burning sensation. Denies this today. Son did purchase skin prep and bunny boots as I recommended on Friday 10/24/20. Right heel is dark red with some darkening noted (bluish appearance in the center of it). No open areas. Reinforced to son to apply skin prep twice daily and continue to place bunny boots on her while she is in bed. Also recommended leg elevation while she is sitting up in her recliner as tolerated. Son says that patient is less mobile. She only gets up to go to the bathroom, which requires his assistance. He then transports her using the wheelchair. She had a fall in the bathroom about 2 weeks ago without injury. Son was out of town and patient's daughter was present. Her blood pressure was elevated today, 172/113. Patient denies dizziness or headache. She says she feels fine. Son feels that patient is just nervous about changes they are seeing. He just wants to continue to monitor her. He reports that about 2 months ago she was taken off of her blood thinner and Amaryl. Son will call  with any questions/concerns.  HISTORY OF PRESENT ILLNESS:  This is a 86 yo female with a diagnosis of dementia without behavioral disturbances. She has a h/o cerebellar stroke, TIAs, DM, ataxia and generalized weakness. Palliative care continues to follow patient for symptom management, goals of care and complex decision making.   CODE STATUS: DNR ADVANCED DIRECTIVES: Y MOST FORM: yes PPS: 30%   PHYSICAL EXAM:   VITALS: Today's Vitals   10/28/20 1001  BP: (!) 172/113  Pulse: (!) 59  Resp: 18  Temp: 97.8 F (36.6 C)  TempSrc: Temporal  SpO2: 94%  PainSc: 0-No pain    LUNGS: clear to auscultation  CARDIAC: Cor RRR EXTREMITIES: No edema SKIN:  See above note   NEURO:  Alert and oriented to person/place, mild confusion at times, forgetful, able to engage in appropriate conversation and make her needs known   (Duration of visit and documentation 60 minutes)   Daryl Eastern, RN BSN

## 2020-10-28 NOTE — Progress Notes (Signed)
[facility] RN NOTE  PATIENT NAME: [name] DOB: [birth year] MRN: [id number]  PRIMARY CARE PROVIDER: Shon Baton, MD  RESPONSIBLE PARTY: Philis Fendt (son) Acct ID - [place] Phone Work Phone Relationship Acct Type  [id number] ALERA, PASCHEN [phone]  Self P/F     [place], [name], [place] [phone]   Due to the COVID-19 crisis, this virtual check-in visit was done via telephone from my office and it was initiated and consent by this patient and or family.  PLAN OF CARE and INTERVENTION:  ADVANCE CARE PLANNING/GOALS OF CARE: Goal is for patient to remain in her home with son as caregiver. She has a DNR and a MOST form PATIENT/CAREGIVER EDUCATION: Symptom management, prevention/maintenance of skin break down DISEASE STATUS: Virtual check-in visit completed via telephone with son Dellis Filbert. He reports that patient has a pressure injury noted to the outer aspect of her right heel. He was able to send me a picture via secured email. Area is dark red with a darkened blue area noted in the center. Area is not open. Son is concerned that area will worsen. Recommended that skin prep be applied twice daily along with placing bunny boots on both feet while she is in bed for added cushion/protection. Patient often moves her legs in bed, which I believe caused friction leading to this redness. Son to order these items and I will visit with patient on 10/28/20 to assess in person. Son is Patent attorney.  HISTORY OF PRESENT ILLNESS: This is a 86 yo female with a diagnosis of dementia without behavioral disturbances. She has a h/o cerebellar stroke, TIAs, DM, ataxia and generalized weakness. Palliative care continues to follow patient.    CODE STATUS: DNR ADVANCED DIRECTIVES: Y MOST FORM: yes PPS: 30%   (Duration of visit and documentation 20 minutes)   Daryl Eastern, RN BSN

## 2020-10-30 DIAGNOSIS — M1990 Unspecified osteoarthritis, unspecified site: Secondary | ICD-10-CM | POA: Diagnosis not present

## 2020-10-30 DIAGNOSIS — R208 Other disturbances of skin sensation: Secondary | ICD-10-CM | POA: Diagnosis not present

## 2020-10-30 DIAGNOSIS — R269 Unspecified abnormalities of gait and mobility: Secondary | ICD-10-CM | POA: Diagnosis not present

## 2020-10-30 DIAGNOSIS — R21 Rash and other nonspecific skin eruption: Secondary | ICD-10-CM | POA: Diagnosis not present

## 2020-11-07 ENCOUNTER — Other Ambulatory Visit: Payer: Self-pay | Admitting: *Deleted

## 2020-11-07 NOTE — Patient Outreach (Signed)
[facility] [facility]) Care Management  11/07/2020  [name] [birth year] [id number]   Telephone Assessment-Successful-COPD  RN attempted outreach call today spoke with so [name] who provided an update on pt's. Verified pt remains on track and asymptomatic with her COPD. Plan of care discussed alone with other issues that have occurred with pt's falls (history). [name] will be involved once again for HHPT alone with a privately hired aide to assist pt with her ADLs. Updates noted on this care plan. RN stress adherence with all discussed.   Pt also has [facility] needed along with Palliative services who are not able to completed a portable chest X-ray if needed in the home.  RN will follow up next month on pt's ongoing management of care and update plan accordingly. Will also communicate with pt's provider on her ongoing disposition with  [facility] services.   Goals Addressed             This Visit's Progress    THN-Disease Progression Minimized or Managed   On track    Follow up Date 12/03/2020 Timeframe:  Long-Range Goal Priority:  Medium Start Date:   04/04/2020                  Expected End Date:    01/21/2021                     Evidence-based guidance:  Identify current smoking/tobacco use; provide smoking cessation intervention.  Assess symptom control by the frequency and type of symptoms, reliever use and activity limitation at every encounter.  Assess risk for exacerbation (flare up) by evaluating spirometry, pulse oximetry, reliever use, presentation of symptoms and activity limitation; anticipate treatment adjustment based on risks and resources.  Develop and/or review and reinforce use of COPD rescue (action) plan even when symptoms are controlled or infrequent.  Ask patient to bring inhaler to all visits; assess and reinforce correct technique; address barriers to proper inhaler use, such as older age, use of multiple devices and lack of understanding.   Identify  symptom triggers, such as smoking, virus, weather change, emotional upset, exercise, obesity and environmental allergen; consider reduction of work-exposure versus elimination to avoid compromising employment.  Correlate presentation to comorbidity, such as diabetes, heart failure, obstructive sleep apnea, depression and anxiety, which may worsen symptoms.  Prepare for individualized pharmacologic therapy that may include LABA (long-acting beta-2 agonist), LAMA (long-acting muscarinic antagonist), SABA (short-acting beta-2 agonist) oral or inhaled corticosteroid.  Promote participation in pulmonary rehabilitation for breathing exercises, skills training, improved exercise capacity, mood and quality of life; address barriers to participation.  Promote physical activity or exercise to improve or maintain exercise capacity, based on tolerance that may include walking, water exercise, cycling or limb muscle strength training.  Promote use of energy conservation and activity pacing techniques.  Promote use of breathing and coughing techniques, such as inspiratory muscle training, pursed-lip breathing, diaphragmatic breathing, pranayama yoga breathing or huff cough.  Screen for malnutrition risk factors, such as unintentional weight loss and poor oral intake; refer to dietitian if identified.  Consider recommendation for oral drink supplement or multivitamin and mineral supplements if suspect inadequate oral intake or micronutrient deficiencies.   Screen for obstructive sleep apnea; prepare patient for polysomnography based on risk and presentation.  Prepare patient for use of long-term oxygen and noninvasive ventilation to relieve hypercapnia, hypoxemia, obstructive sleep apnea and reduce work of breathing.  Prepare patient with worsening disease for surgical interventions that may include  bronchoscopy, lung volume reduction surgery, bullectomy or lung transplantation.  Barriers: Health Behaviors   Notes:   8/17- Son [name]) indicates pt remains on home O2 via 3 liters at night only and continue to do well. Pt mostly home bound with assistance from his son. No other services other then [facility] involved at this time. States they are able to provide mobile x-rays if needed due to pt's history of pneumonia. 5/12-POA son provided update on pt's ongoing care with involved services. Reports [facility] PT attempted to extend X 4 more weeks, Ramp has bee build onto the home and now completed for pt's use, Palliative ([facility]) continue involvement with a home visit yesterday and the request to change one of the pt's medication was recommended by a psychiatrist completed as pt doing well with no additional issues.  Feb-Based upon pt's needs several agencies are involved with her care. [facility] 2 more visits remain, [facility] and pending [place] for building a ramp for this pt. Currently awaiting funds to process. Discuss minimizing visitors that maybe sick to lower the risk for possible infection. Son verbalizes the importance of all discussed today.      THN-Prevent Falls and Injury       Follow Up Date 12/03/2020   Timeframe:  Short-Term Goal Priority:  Medium Start Date:     11/07/2020                        Expected End Date:     12/22/2020                   - always use handrails on the stairs - always wear low-heeled or flat shoes or slippers with nonskid soles - call the doctor if I am feeling too drowsy - install bathroom grab bars - keep a flashlight by the bed - keep my cell phone with me always - learn how to get back up if I fall - make an emergency alert plan in case I fall - pick up clutter from the floors Barriers: Health Behaviors     Why is this important?   Most falls happen when it is hard for you to walk safely. Your balance may be off because of an illness. You may have pain in your knees, hip or other joints.  You may be overly tired or taking  medicines that make you sleepy. You may not be able to see or hear clearly.  Falls can lead to broken bones, bruises or other injuries.  There are things you can do to help prevent falling.     Notes:  9/16-Discussed safety measures with caregiver and possible interventions. Verified [facility] will being involved for PT services (history in the past). Agency currently pending and will continue to assist pt with  PT services in the home. Encouraged safety within the home. Pt also has private hired bath assistance with ADLs.     THN-Track and Manage My Symptoms   On track    Follow Up Date: 12/03/2020 Timeframe:  Short Term Priority:  Medium Start Date:     03/04/2020                        Expected End Date:    12/22/2020                     - develop a rescue plan - eliminate symptom triggers at home -  follow rescue plan if symptoms flare-up - keep follow-up appointments    Why is this important?   Tracking your symptoms and other information about your health helps your doctor plan your care.  Write down the symptoms, the time of day, what you were doing and what medicine you are taking.  You will soon learn how to manage your symptoms.    Barriers: Health Behaviors  Notes: 9/16-Son [name] reports pt remains on home O2 currently on 3 liters at night and RN during the day. Pt doing well with no acute symptoms from her COPD with good management of care. Involved [facility] pending once again for pt's mobility when pt is weak and at risk for falls. 8/17-Pt remains on home O2 via 3 liters at night only and continue to do well with no related symptoms and remains in the GREEN zone. Pt mostly home bound with assistance from his son. No other services other then [facility] involved at this time. 4/13-Pt continue to do well and has not used her emergence inhalers and continue to use her home O2 consistently with no reported system issues. However son [name] has indicated changing oxygen supplier  and requested resources. Resources were supplied on today's call for other O2 suppliers in the area ([place], [place] and [facility]). 3/10-Caregiver reports pt doing well however recent partial fall as pt remains weak. States he is assisting pt more each day due to her ongoing weakness. [facility] just finished working with pt for PT with plans for pt to have PT once again after 30-60 days clearance for [facility] to approve once again. [facility] RN visited with rhonic noted and portable X-ray indicated pneumonia. Pt now on 10 days of antibiotics (today #7). Pt remains on Home O2 #3 liters with good readings. FEB- Caregiver son [name]) reports [name] will be ending after 2 additional visits and he would like to continue some form of therapy. Encouraged son to inquire with [facility] since they remain involved for weekly PT services. Note they have provided this services in the past.   [facility] remains involved with PT services along with [facility] for weekly PT. Home O2 with [facility]. Will extend to allow ongoing adherence with pt's management of care. Pt currently pending appointment with neurologist and PCP on 1/13. Verified no acute symptoms over the past month with pt's COPD.         Raina Mina, RN Care Management Coordinator [place] Office [phone]

## 2020-11-17 DIAGNOSIS — D631 Anemia in chronic kidney disease: Secondary | ICD-10-CM | POA: Diagnosis not present

## 2020-11-17 DIAGNOSIS — F3341 Major depressive disorder, recurrent, in partial remission: Secondary | ICD-10-CM | POA: Diagnosis not present

## 2020-11-17 DIAGNOSIS — M1711 Unilateral primary osteoarthritis, right knee: Secondary | ICD-10-CM | POA: Diagnosis not present

## 2020-11-17 DIAGNOSIS — M353 Polymyalgia rheumatica: Secondary | ICD-10-CM | POA: Diagnosis not present

## 2020-11-17 DIAGNOSIS — M19011 Primary osteoarthritis, right shoulder: Secondary | ICD-10-CM | POA: Diagnosis not present

## 2020-11-17 DIAGNOSIS — M810 Age-related osteoporosis without current pathological fracture: Secondary | ICD-10-CM | POA: Diagnosis not present

## 2020-11-17 DIAGNOSIS — E785 Hyperlipidemia, unspecified: Secondary | ICD-10-CM | POA: Diagnosis not present

## 2020-11-17 DIAGNOSIS — F0150 Vascular dementia without behavioral disturbance: Secondary | ICD-10-CM | POA: Diagnosis not present

## 2020-11-17 DIAGNOSIS — R1310 Dysphagia, unspecified: Secondary | ICD-10-CM | POA: Diagnosis not present

## 2020-11-17 DIAGNOSIS — N1831 Chronic kidney disease, stage 3a: Secondary | ICD-10-CM | POA: Diagnosis not present

## 2020-11-17 DIAGNOSIS — E538 Deficiency of other specified B group vitamins: Secondary | ICD-10-CM | POA: Diagnosis not present

## 2020-11-17 DIAGNOSIS — E1122 Type 2 diabetes mellitus with diabetic chronic kidney disease: Secondary | ICD-10-CM | POA: Diagnosis not present

## 2020-11-17 DIAGNOSIS — I422 Other hypertrophic cardiomyopathy: Secondary | ICD-10-CM | POA: Diagnosis not present

## 2020-11-17 DIAGNOSIS — I4510 Unspecified right bundle-branch block: Secondary | ICD-10-CM | POA: Diagnosis not present

## 2020-11-17 DIAGNOSIS — M19012 Primary osteoarthritis, left shoulder: Secondary | ICD-10-CM | POA: Diagnosis not present

## 2020-11-17 DIAGNOSIS — M5136 Other intervertebral disc degeneration, lumbar region: Secondary | ICD-10-CM | POA: Diagnosis not present

## 2020-11-17 DIAGNOSIS — I1310 Hypertensive heart and chronic kidney disease without heart failure, with stage 1 through stage 4 chronic kidney disease, or unspecified chronic kidney disease: Secondary | ICD-10-CM | POA: Diagnosis not present

## 2020-11-17 DIAGNOSIS — D509 Iron deficiency anemia, unspecified: Secondary | ICD-10-CM | POA: Diagnosis not present

## 2020-11-17 DIAGNOSIS — G4733 Obstructive sleep apnea (adult) (pediatric): Secondary | ICD-10-CM | POA: Diagnosis not present

## 2020-11-17 DIAGNOSIS — K279 Peptic ulcer, site unspecified, unspecified as acute or chronic, without hemorrhage or perforation: Secondary | ICD-10-CM | POA: Diagnosis not present

## 2020-11-17 DIAGNOSIS — M069 Rheumatoid arthritis, unspecified: Secondary | ICD-10-CM | POA: Diagnosis not present

## 2020-11-17 DIAGNOSIS — M160 Bilateral primary osteoarthritis of hip: Secondary | ICD-10-CM | POA: Diagnosis not present

## 2020-11-17 DIAGNOSIS — E1142 Type 2 diabetes mellitus with diabetic polyneuropathy: Secondary | ICD-10-CM | POA: Diagnosis not present

## 2020-11-17 DIAGNOSIS — J449 Chronic obstructive pulmonary disease, unspecified: Secondary | ICD-10-CM | POA: Diagnosis not present

## 2020-11-17 DIAGNOSIS — E039 Hypothyroidism, unspecified: Secondary | ICD-10-CM | POA: Diagnosis not present

## 2020-11-21 DIAGNOSIS — M353 Polymyalgia rheumatica: Secondary | ICD-10-CM | POA: Diagnosis not present

## 2020-11-21 DIAGNOSIS — M160 Bilateral primary osteoarthritis of hip: Secondary | ICD-10-CM | POA: Diagnosis not present

## 2020-11-21 DIAGNOSIS — M19012 Primary osteoarthritis, left shoulder: Secondary | ICD-10-CM | POA: Diagnosis not present

## 2020-11-21 DIAGNOSIS — M1711 Unilateral primary osteoarthritis, right knee: Secondary | ICD-10-CM | POA: Diagnosis not present

## 2020-11-21 DIAGNOSIS — M069 Rheumatoid arthritis, unspecified: Secondary | ICD-10-CM | POA: Diagnosis not present

## 2020-11-21 DIAGNOSIS — M19011 Primary osteoarthritis, right shoulder: Secondary | ICD-10-CM | POA: Diagnosis not present

## 2020-11-24 DIAGNOSIS — M1711 Unilateral primary osteoarthritis, right knee: Secondary | ICD-10-CM | POA: Diagnosis not present

## 2020-11-24 DIAGNOSIS — M19012 Primary osteoarthritis, left shoulder: Secondary | ICD-10-CM | POA: Diagnosis not present

## 2020-11-24 DIAGNOSIS — M069 Rheumatoid arthritis, unspecified: Secondary | ICD-10-CM | POA: Diagnosis not present

## 2020-11-24 DIAGNOSIS — M19011 Primary osteoarthritis, right shoulder: Secondary | ICD-10-CM | POA: Diagnosis not present

## 2020-11-24 DIAGNOSIS — M353 Polymyalgia rheumatica: Secondary | ICD-10-CM | POA: Diagnosis not present

## 2020-11-24 DIAGNOSIS — M160 Bilateral primary osteoarthritis of hip: Secondary | ICD-10-CM | POA: Diagnosis not present

## 2020-11-25 DIAGNOSIS — M069 Rheumatoid arthritis, unspecified: Secondary | ICD-10-CM | POA: Diagnosis not present

## 2020-11-25 DIAGNOSIS — M1711 Unilateral primary osteoarthritis, right knee: Secondary | ICD-10-CM | POA: Diagnosis not present

## 2020-11-25 DIAGNOSIS — M160 Bilateral primary osteoarthritis of hip: Secondary | ICD-10-CM | POA: Diagnosis not present

## 2020-11-25 DIAGNOSIS — M19011 Primary osteoarthritis, right shoulder: Secondary | ICD-10-CM | POA: Diagnosis not present

## 2020-11-25 DIAGNOSIS — M19012 Primary osteoarthritis, left shoulder: Secondary | ICD-10-CM | POA: Diagnosis not present

## 2020-11-25 DIAGNOSIS — M353 Polymyalgia rheumatica: Secondary | ICD-10-CM | POA: Diagnosis not present

## 2020-11-26 DIAGNOSIS — M19011 Primary osteoarthritis, right shoulder: Secondary | ICD-10-CM | POA: Diagnosis not present

## 2020-11-26 DIAGNOSIS — M353 Polymyalgia rheumatica: Secondary | ICD-10-CM | POA: Diagnosis not present

## 2020-11-26 DIAGNOSIS — M19012 Primary osteoarthritis, left shoulder: Secondary | ICD-10-CM | POA: Diagnosis not present

## 2020-11-26 DIAGNOSIS — M1711 Unilateral primary osteoarthritis, right knee: Secondary | ICD-10-CM | POA: Diagnosis not present

## 2020-11-26 DIAGNOSIS — M160 Bilateral primary osteoarthritis of hip: Secondary | ICD-10-CM | POA: Diagnosis not present

## 2020-11-26 DIAGNOSIS — M069 Rheumatoid arthritis, unspecified: Secondary | ICD-10-CM | POA: Diagnosis not present

## 2020-11-27 DIAGNOSIS — M19012 Primary osteoarthritis, left shoulder: Secondary | ICD-10-CM | POA: Diagnosis not present

## 2020-11-27 DIAGNOSIS — M353 Polymyalgia rheumatica: Secondary | ICD-10-CM | POA: Diagnosis not present

## 2020-11-27 DIAGNOSIS — M160 Bilateral primary osteoarthritis of hip: Secondary | ICD-10-CM | POA: Diagnosis not present

## 2020-11-27 DIAGNOSIS — M1711 Unilateral primary osteoarthritis, right knee: Secondary | ICD-10-CM | POA: Diagnosis not present

## 2020-11-27 DIAGNOSIS — M069 Rheumatoid arthritis, unspecified: Secondary | ICD-10-CM | POA: Diagnosis not present

## 2020-11-27 DIAGNOSIS — M19011 Primary osteoarthritis, right shoulder: Secondary | ICD-10-CM | POA: Diagnosis not present

## 2020-12-01 DIAGNOSIS — M1711 Unilateral primary osteoarthritis, right knee: Secondary | ICD-10-CM | POA: Diagnosis not present

## 2020-12-01 DIAGNOSIS — M19012 Primary osteoarthritis, left shoulder: Secondary | ICD-10-CM | POA: Diagnosis not present

## 2020-12-01 DIAGNOSIS — M069 Rheumatoid arthritis, unspecified: Secondary | ICD-10-CM | POA: Diagnosis not present

## 2020-12-01 DIAGNOSIS — M160 Bilateral primary osteoarthritis of hip: Secondary | ICD-10-CM | POA: Diagnosis not present

## 2020-12-01 DIAGNOSIS — M353 Polymyalgia rheumatica: Secondary | ICD-10-CM | POA: Diagnosis not present

## 2020-12-01 DIAGNOSIS — M19011 Primary osteoarthritis, right shoulder: Secondary | ICD-10-CM | POA: Diagnosis not present

## 2020-12-02 DIAGNOSIS — M069 Rheumatoid arthritis, unspecified: Secondary | ICD-10-CM | POA: Diagnosis not present

## 2020-12-02 DIAGNOSIS — M19012 Primary osteoarthritis, left shoulder: Secondary | ICD-10-CM | POA: Diagnosis not present

## 2020-12-02 DIAGNOSIS — M160 Bilateral primary osteoarthritis of hip: Secondary | ICD-10-CM | POA: Diagnosis not present

## 2020-12-02 DIAGNOSIS — M19011 Primary osteoarthritis, right shoulder: Secondary | ICD-10-CM | POA: Diagnosis not present

## 2020-12-02 DIAGNOSIS — M353 Polymyalgia rheumatica: Secondary | ICD-10-CM | POA: Diagnosis not present

## 2020-12-02 DIAGNOSIS — M1711 Unilateral primary osteoarthritis, right knee: Secondary | ICD-10-CM | POA: Diagnosis not present

## 2020-12-03 ENCOUNTER — Other Ambulatory Visit: Payer: Self-pay | Admitting: *Deleted

## 2020-12-03 DIAGNOSIS — M160 Bilateral primary osteoarthritis of hip: Secondary | ICD-10-CM | POA: Diagnosis not present

## 2020-12-03 DIAGNOSIS — M353 Polymyalgia rheumatica: Secondary | ICD-10-CM | POA: Diagnosis not present

## 2020-12-03 DIAGNOSIS — M19012 Primary osteoarthritis, left shoulder: Secondary | ICD-10-CM | POA: Diagnosis not present

## 2020-12-03 DIAGNOSIS — M069 Rheumatoid arthritis, unspecified: Secondary | ICD-10-CM | POA: Diagnosis not present

## 2020-12-03 DIAGNOSIS — M19011 Primary osteoarthritis, right shoulder: Secondary | ICD-10-CM | POA: Diagnosis not present

## 2020-12-03 DIAGNOSIS — M1711 Unilateral primary osteoarthritis, right knee: Secondary | ICD-10-CM | POA: Diagnosis not present

## 2020-12-03 NOTE — Patient Outreach (Signed)
[facility]) Care Management  12/03/2020  [name] 11/03/1934 [id number]   Telephone Assessment-Unsuccessful  RN attempted outreach call to pt today however unsuccessful. RN able to leave a HIPAA approved voice message requesting a call back.  Will attempted another outreach call over the next week for continuously services.  Raina Mina, RN Care Management Coordinator [place] Office [phone]

## 2020-12-04 DIAGNOSIS — M19012 Primary osteoarthritis, left shoulder: Secondary | ICD-10-CM | POA: Diagnosis not present

## 2020-12-04 DIAGNOSIS — M069 Rheumatoid arthritis, unspecified: Secondary | ICD-10-CM | POA: Diagnosis not present

## 2020-12-04 DIAGNOSIS — M1711 Unilateral primary osteoarthritis, right knee: Secondary | ICD-10-CM | POA: Diagnosis not present

## 2020-12-04 DIAGNOSIS — M19011 Primary osteoarthritis, right shoulder: Secondary | ICD-10-CM | POA: Diagnosis not present

## 2020-12-04 DIAGNOSIS — M353 Polymyalgia rheumatica: Secondary | ICD-10-CM | POA: Diagnosis not present

## 2020-12-04 DIAGNOSIS — M160 Bilateral primary osteoarthritis of hip: Secondary | ICD-10-CM | POA: Diagnosis not present

## 2020-12-08 ENCOUNTER — Other Ambulatory Visit: Payer: Self-pay | Admitting: *Deleted

## 2020-12-08 DIAGNOSIS — M19011 Primary osteoarthritis, right shoulder: Secondary | ICD-10-CM | POA: Diagnosis not present

## 2020-12-08 DIAGNOSIS — M1711 Unilateral primary osteoarthritis, right knee: Secondary | ICD-10-CM | POA: Diagnosis not present

## 2020-12-08 DIAGNOSIS — M160 Bilateral primary osteoarthritis of hip: Secondary | ICD-10-CM | POA: Diagnosis not present

## 2020-12-08 DIAGNOSIS — M069 Rheumatoid arthritis, unspecified: Secondary | ICD-10-CM | POA: Diagnosis not present

## 2020-12-08 DIAGNOSIS — M353 Polymyalgia rheumatica: Secondary | ICD-10-CM | POA: Diagnosis not present

## 2020-12-08 DIAGNOSIS — M19012 Primary osteoarthritis, left shoulder: Secondary | ICD-10-CM | POA: Diagnosis not present

## 2020-12-08 NOTE — Patient Outreach (Signed)
[facility]) Care Management  12/08/2020  [name] 04-24-1934 [id number]   Telephone Assessment-Successful-COPD  POA [name]) provided an update on pt's ongoing progress over the last month. Plan of care known to POA with noted updates within the plan of care. Caregiver indicates pt remains "stable" with her ongoing management of care.   Will follow up once again over the next month related to pt's progress. Note [name] remains involved for PT/OT services.    Goals Addressed             This Visit's Progress    THN-Disease Progression Minimized or Managed   On track    Follow up Date 01/06/2021 Timeframe:  Long-Range Goal Priority:  Medium Start Date:   04/04/2020                  Expected End Date:    01/21/2021                     Evidence-based guidance:  Identify current smoking/tobacco use; provide smoking cessation intervention.  Assess symptom control by the frequency and type of symptoms, reliever use and activity limitation at every encounter.  Assess risk for exacerbation (flare up) by evaluating spirometry, pulse oximetry, reliever use, presentation of symptoms and activity limitation; anticipate treatment adjustment based on risks and resources.  Develop and/or review and reinforce use of COPD rescue (action) plan even when symptoms are controlled or infrequent.  Ask patient to bring inhaler to all visits; assess and reinforce correct technique; address barriers to proper inhaler use, such as older age, use of multiple devices and lack of understanding.   Identify symptom triggers, such as smoking, virus, weather change, emotional upset, exercise, obesity and environmental allergen; consider reduction of work-exposure versus elimination to avoid compromising employment.  Correlate presentation to comorbidity, such as diabetes, heart failure, obstructive sleep apnea, depression and anxiety, which may worsen symptoms.  Prepare for individualized  pharmacologic therapy that may include LABA (long-acting beta-2 agonist), LAMA (long-acting muscarinic antagonist), SABA (short-acting beta-2 agonist) oral or inhaled corticosteroid.  Promote participation in pulmonary rehabilitation for breathing exercises, skills training, improved exercise capacity, mood and quality of life; address barriers to participation.  Promote physical activity or exercise to improve or maintain exercise capacity, based on tolerance that may include walking, water exercise, cycling or limb muscle strength training.  Promote use of energy conservation and activity pacing techniques.  Promote use of breathing and coughing techniques, such as inspiratory muscle training, pursed-lip breathing, diaphragmatic breathing, pranayama yoga breathing or huff cough.  Screen for malnutrition risk factors, such as unintentional weight loss and poor oral intake; refer to dietitian if identified.  Consider recommendation for oral drink supplement or multivitamin and mineral supplements if suspect inadequate oral intake or micronutrient deficiencies.   Screen for obstructive sleep apnea; prepare patient for polysomnography based on risk and presentation.  Prepare patient for use of long-term oxygen and noninvasive ventilation to relieve hypercapnia, hypoxemia, obstructive sleep apnea and reduce work of breathing.  Prepare patient with worsening disease for surgical interventions that may include bronchoscopy, lung volume reduction surgery, bullectomy or lung transplantation.  Barriers: Health Behaviors   Notes:  8/17- [name]) indicates pt remains on home O2 via 3 liters at night only and continue to do well. Pt mostly home bound with assistance from his son. No other services other then [facility] involved at this time. States they are able to provide mobile x-rays if needed due to  pt's history of pneumonia. 5/12-POA son provided update on pt's ongoing care with involved services.  Reports [facility] PT attempted to extend X 4 more weeks, Ramp has bee build onto the home and now completed for pt's use, Palliative ([facility]) continue involvement with a home visit yesterday and the request to change one of the pt's medication was recommended by a psychiatrist completed as pt doing well with no additional issues.  Feb-Based upon pt's needs several agencies are involved with her care. [facility] 2 more visits remain, [facility] and pending [place] for building a ramp for this pt. Currently awaiting funds to process. Discuss minimizing visitors that maybe sick to lower the risk for possible infection. Son verbalizes the importance of all discussed today.      THN-Prevent Falls and Injury   On track    Follow Up Date 01/06/2021   Timeframe:  Short-Term Goal Priority:  Medium Start Date:     11/07/2020                        Expected End Date:     01/21/2021                   - always use handrails on the stairs - always wear low-heeled or flat shoes or slippers with nonskid soles - call the doctor if I am feeling too drowsy - install bathroom grab bars - keep a flashlight by the bed - keep my cell phone with me always - learn how to get back up if I fall - make an emergency alert plan in case I fall - pick up clutter from the floors Barriers: Health Behaviors     Why is this important?   Most falls happen when it is hard for you to walk safely. Your balance may be off because of an illness. You may have pain in your knees, hip or other joints.  You may be overly tired or taking medicines that make you sleepy. You may not be able to see or hear clearly.  Falls can lead to broken bones, bruises or other injuries.  There are things you can do to help prevent falling.     Notes:  10/17-[facility] remains involved and will re-evaluated for another 5 weeks of services for PT/OT services to prevent falls. 9/16-Discussed safety measures with caregiver and  possible interventions. Verified [facility] will being involved for PT services (history in the past). Agency currently pending and will continue to assist pt with  PT services in the home. Encouraged safety within the home. Pt also has private hired bath assistance with ADLs.     THN-Track and Manage My Symptoms   On track    Follow Up Date: 01/06/2021 Timeframe:  Short Term Priority:  Medium Start Date:     03/04/2020                        Expected End Date:    01/21/2021                     - develop a rescue plan - eliminate symptom triggers at home - follow rescue plan if symptoms flare-up - keep follow-up appointments    Why is this important?   Tracking your symptoms and other information about your health helps your doctor plan your care.  Write down the symptoms, the time of day, what you were doing and  what medicine you are taking.  You will soon learn how to manage your symptoms.    Barriers: Health Behaviors  Notes: 10/17-POA-[name] reports pt remains stable with no symptoms reported. Pt remains on home O2 with no acute issues related to her COPD (GREEN zone) at this time.   9/16-[name] reports pt remains on home O2 currently on 3 liters at night and RN during the day. Pt doing well with no acute symptoms from her COPD with good management of care. Involved [facility] pending once again for pt's mobility when pt is weak and at risk for falls. 8/17-Pt remains on home O2 via 3 liters at night only and continue to do well with no related symptoms and remains in the GREEN zone. Pt mostly home bound with assistance from his son. No other services other then [facility] involved at this time. 4/13-Pt continue to do well and has not used her emergence inhalers and continue to use her home O2 consistently with no reported system issues. However [name] has indicated changing oxygen supplier and requested resources. Resources were supplied on today's call for other O2 suppliers in the  area ([place], [place] and [facility]). 3/10-Caregiver reports pt doing well however recent partial fall as pt remains weak. States he is assisting pt more each day due to her ongoing weakness. [facility] PT just finished working with pt for PT with plans for pt to have PT once again after 30-60 days clearance for [facility] to approve once again. [facility] RN visited with rhonic noted and portable X-ray indicated pneumonia. Pt now on 10 days of antibiotics (today #7). Pt remains on Home O2 #3 liters with good readings. FEB- Caregiver [name]) reports [name] will be ending after 2 additional visits and he would like to continue some form of therapy. Encouraged son to inquire with [facility] since they remain involved for weekly PT services. Note they have provided this services in the past.   [facility] remains involved with PT services along with [facility] for weekly PT. Home O2 with [employer]. Will extend to allow ongoing adherence with pt's management of care. Pt currently pending appointment with neurologist and PCP on 1/13. Verified no acute symptoms over the past month with pt's COPD.         Raina Mina, RN Care Management Coordinator [place] Office [phone]

## 2020-12-09 DIAGNOSIS — M1711 Unilateral primary osteoarthritis, right knee: Secondary | ICD-10-CM | POA: Diagnosis not present

## 2020-12-09 DIAGNOSIS — M353 Polymyalgia rheumatica: Secondary | ICD-10-CM | POA: Diagnosis not present

## 2020-12-09 DIAGNOSIS — M19012 Primary osteoarthritis, left shoulder: Secondary | ICD-10-CM | POA: Diagnosis not present

## 2020-12-09 DIAGNOSIS — M160 Bilateral primary osteoarthritis of hip: Secondary | ICD-10-CM | POA: Diagnosis not present

## 2020-12-09 DIAGNOSIS — M069 Rheumatoid arthritis, unspecified: Secondary | ICD-10-CM | POA: Diagnosis not present

## 2020-12-09 DIAGNOSIS — M19011 Primary osteoarthritis, right shoulder: Secondary | ICD-10-CM | POA: Diagnosis not present

## 2020-12-10 ENCOUNTER — Ambulatory Visit: Payer: Self-pay | Admitting: *Deleted

## 2020-12-11 DIAGNOSIS — M353 Polymyalgia rheumatica: Secondary | ICD-10-CM | POA: Diagnosis not present

## 2020-12-11 DIAGNOSIS — M19011 Primary osteoarthritis, right shoulder: Secondary | ICD-10-CM | POA: Diagnosis not present

## 2020-12-11 DIAGNOSIS — M1711 Unilateral primary osteoarthritis, right knee: Secondary | ICD-10-CM | POA: Diagnosis not present

## 2020-12-11 DIAGNOSIS — M069 Rheumatoid arthritis, unspecified: Secondary | ICD-10-CM | POA: Diagnosis not present

## 2020-12-11 DIAGNOSIS — M160 Bilateral primary osteoarthritis of hip: Secondary | ICD-10-CM | POA: Diagnosis not present

## 2020-12-11 DIAGNOSIS — M19012 Primary osteoarthritis, left shoulder: Secondary | ICD-10-CM | POA: Diagnosis not present

## 2020-12-15 DIAGNOSIS — M19011 Primary osteoarthritis, right shoulder: Secondary | ICD-10-CM | POA: Diagnosis not present

## 2020-12-15 DIAGNOSIS — M069 Rheumatoid arthritis, unspecified: Secondary | ICD-10-CM | POA: Diagnosis not present

## 2020-12-15 DIAGNOSIS — M1711 Unilateral primary osteoarthritis, right knee: Secondary | ICD-10-CM | POA: Diagnosis not present

## 2020-12-15 DIAGNOSIS — M160 Bilateral primary osteoarthritis of hip: Secondary | ICD-10-CM | POA: Diagnosis not present

## 2020-12-15 DIAGNOSIS — M353 Polymyalgia rheumatica: Secondary | ICD-10-CM | POA: Diagnosis not present

## 2020-12-15 DIAGNOSIS — M19012 Primary osteoarthritis, left shoulder: Secondary | ICD-10-CM | POA: Diagnosis not present

## 2020-12-16 DIAGNOSIS — M353 Polymyalgia rheumatica: Secondary | ICD-10-CM | POA: Diagnosis not present

## 2020-12-16 DIAGNOSIS — M160 Bilateral primary osteoarthritis of hip: Secondary | ICD-10-CM | POA: Diagnosis not present

## 2020-12-16 DIAGNOSIS — M069 Rheumatoid arthritis, unspecified: Secondary | ICD-10-CM | POA: Diagnosis not present

## 2020-12-16 DIAGNOSIS — M19011 Primary osteoarthritis, right shoulder: Secondary | ICD-10-CM | POA: Diagnosis not present

## 2020-12-16 DIAGNOSIS — M19012 Primary osteoarthritis, left shoulder: Secondary | ICD-10-CM | POA: Diagnosis not present

## 2020-12-16 DIAGNOSIS — M1711 Unilateral primary osteoarthritis, right knee: Secondary | ICD-10-CM | POA: Diagnosis not present

## 2020-12-17 DIAGNOSIS — D631 Anemia in chronic kidney disease: Secondary | ICD-10-CM | POA: Diagnosis not present

## 2020-12-17 DIAGNOSIS — M1711 Unilateral primary osteoarthritis, right knee: Secondary | ICD-10-CM | POA: Diagnosis not present

## 2020-12-17 DIAGNOSIS — I422 Other hypertrophic cardiomyopathy: Secondary | ICD-10-CM | POA: Diagnosis not present

## 2020-12-17 DIAGNOSIS — R1310 Dysphagia, unspecified: Secondary | ICD-10-CM | POA: Diagnosis not present

## 2020-12-17 DIAGNOSIS — M160 Bilateral primary osteoarthritis of hip: Secondary | ICD-10-CM | POA: Diagnosis not present

## 2020-12-17 DIAGNOSIS — E1142 Type 2 diabetes mellitus with diabetic polyneuropathy: Secondary | ICD-10-CM | POA: Diagnosis not present

## 2020-12-17 DIAGNOSIS — I1310 Hypertensive heart and chronic kidney disease without heart failure, with stage 1 through stage 4 chronic kidney disease, or unspecified chronic kidney disease: Secondary | ICD-10-CM | POA: Diagnosis not present

## 2020-12-17 DIAGNOSIS — M069 Rheumatoid arthritis, unspecified: Secondary | ICD-10-CM | POA: Diagnosis not present

## 2020-12-17 DIAGNOSIS — E1122 Type 2 diabetes mellitus with diabetic chronic kidney disease: Secondary | ICD-10-CM | POA: Diagnosis not present

## 2020-12-17 DIAGNOSIS — M19011 Primary osteoarthritis, right shoulder: Secondary | ICD-10-CM | POA: Diagnosis not present

## 2020-12-17 DIAGNOSIS — K279 Peptic ulcer, site unspecified, unspecified as acute or chronic, without hemorrhage or perforation: Secondary | ICD-10-CM | POA: Diagnosis not present

## 2020-12-17 DIAGNOSIS — F0150 Vascular dementia without behavioral disturbance: Secondary | ICD-10-CM | POA: Diagnosis not present

## 2020-12-17 DIAGNOSIS — E785 Hyperlipidemia, unspecified: Secondary | ICD-10-CM | POA: Diagnosis not present

## 2020-12-17 DIAGNOSIS — D509 Iron deficiency anemia, unspecified: Secondary | ICD-10-CM | POA: Diagnosis not present

## 2020-12-17 DIAGNOSIS — M810 Age-related osteoporosis without current pathological fracture: Secondary | ICD-10-CM | POA: Diagnosis not present

## 2020-12-17 DIAGNOSIS — E039 Hypothyroidism, unspecified: Secondary | ICD-10-CM | POA: Diagnosis not present

## 2020-12-17 DIAGNOSIS — G4733 Obstructive sleep apnea (adult) (pediatric): Secondary | ICD-10-CM | POA: Diagnosis not present

## 2020-12-17 DIAGNOSIS — J449 Chronic obstructive pulmonary disease, unspecified: Secondary | ICD-10-CM | POA: Diagnosis not present

## 2020-12-17 DIAGNOSIS — E538 Deficiency of other specified B group vitamins: Secondary | ICD-10-CM | POA: Diagnosis not present

## 2020-12-17 DIAGNOSIS — I4510 Unspecified right bundle-branch block: Secondary | ICD-10-CM | POA: Diagnosis not present

## 2020-12-17 DIAGNOSIS — M353 Polymyalgia rheumatica: Secondary | ICD-10-CM | POA: Diagnosis not present

## 2020-12-17 DIAGNOSIS — M19012 Primary osteoarthritis, left shoulder: Secondary | ICD-10-CM | POA: Diagnosis not present

## 2020-12-17 DIAGNOSIS — M5136 Other intervertebral disc degeneration, lumbar region: Secondary | ICD-10-CM | POA: Diagnosis not present

## 2020-12-17 DIAGNOSIS — F3341 Major depressive disorder, recurrent, in partial remission: Secondary | ICD-10-CM | POA: Diagnosis not present

## 2020-12-17 DIAGNOSIS — N1831 Chronic kidney disease, stage 3a: Secondary | ICD-10-CM | POA: Diagnosis not present

## 2020-12-24 DIAGNOSIS — M160 Bilateral primary osteoarthritis of hip: Secondary | ICD-10-CM | POA: Diagnosis not present

## 2020-12-24 DIAGNOSIS — M19012 Primary osteoarthritis, left shoulder: Secondary | ICD-10-CM | POA: Diagnosis not present

## 2020-12-24 DIAGNOSIS — M069 Rheumatoid arthritis, unspecified: Secondary | ICD-10-CM | POA: Diagnosis not present

## 2020-12-24 DIAGNOSIS — M1711 Unilateral primary osteoarthritis, right knee: Secondary | ICD-10-CM | POA: Diagnosis not present

## 2020-12-24 DIAGNOSIS — M353 Polymyalgia rheumatica: Secondary | ICD-10-CM | POA: Diagnosis not present

## 2020-12-24 DIAGNOSIS — M19011 Primary osteoarthritis, right shoulder: Secondary | ICD-10-CM | POA: Diagnosis not present

## 2020-12-25 DIAGNOSIS — M1711 Unilateral primary osteoarthritis, right knee: Secondary | ICD-10-CM | POA: Diagnosis not present

## 2020-12-25 DIAGNOSIS — M069 Rheumatoid arthritis, unspecified: Secondary | ICD-10-CM | POA: Diagnosis not present

## 2020-12-25 DIAGNOSIS — M353 Polymyalgia rheumatica: Secondary | ICD-10-CM | POA: Diagnosis not present

## 2020-12-25 DIAGNOSIS — M160 Bilateral primary osteoarthritis of hip: Secondary | ICD-10-CM | POA: Diagnosis not present

## 2020-12-25 DIAGNOSIS — M19011 Primary osteoarthritis, right shoulder: Secondary | ICD-10-CM | POA: Diagnosis not present

## 2020-12-25 DIAGNOSIS — M19012 Primary osteoarthritis, left shoulder: Secondary | ICD-10-CM | POA: Diagnosis not present

## 2020-12-29 ENCOUNTER — Other Ambulatory Visit (HOSPITAL_COMMUNITY): Payer: Self-pay | Admitting: Registered Nurse

## 2020-12-29 ENCOUNTER — Other Ambulatory Visit: Payer: Self-pay

## 2020-12-29 ENCOUNTER — Ambulatory Visit (HOSPITAL_COMMUNITY)
Admission: RE | Admit: 2020-12-29 | Discharge: 2020-12-29 | Disposition: A | Discharge status: Home / Self Care | Payer: Medicare Other | Source: Ambulatory Visit | Attending: Adult Health | Admitting: Adult Health

## 2020-12-29 DIAGNOSIS — M1990 Unspecified osteoarthritis, unspecified site: Secondary | ICD-10-CM | POA: Diagnosis not present

## 2020-12-29 DIAGNOSIS — L819 Disorder of pigmentation, unspecified: Secondary | ICD-10-CM | POA: Diagnosis not present

## 2020-12-29 DIAGNOSIS — M79604 Pain in right leg: Secondary | ICD-10-CM

## 2020-12-29 DIAGNOSIS — R269 Unspecified abnormalities of gait and mobility: Secondary | ICD-10-CM | POA: Diagnosis not present

## 2020-12-29 DIAGNOSIS — R208 Other disturbances of skin sensation: Secondary | ICD-10-CM | POA: Diagnosis not present

## 2020-12-30 ENCOUNTER — Telehealth: Payer: Self-pay | Admitting: *Deleted

## 2020-12-30 DIAGNOSIS — Z515 Encounter for palliative care: Secondary | ICD-10-CM

## 2020-12-30 NOTE — Telephone Encounter (Signed)
2:10p Called and spoke with patient's son, [name] to follow up on how patient was doing. He advised that she had a Cardiology appointment yesterday to due to c/o right leg pain. They also become discolorated, bluish in appearance, especially when in a dependent position. She also experiences a burning sensation. He says they don't feel that she has a blockage, however the results from her most recent test (he was unsure of the name of the test) are pending. She has an appointment with her Podiatrist tomorrow. He is requesting a RN visit this week. Visit scheduled for 01/01/21 at 3:30pm.

## 2021-01-01 DIAGNOSIS — M160 Bilateral primary osteoarthritis of hip: Secondary | ICD-10-CM | POA: Diagnosis not present

## 2021-01-01 DIAGNOSIS — M1711 Unilateral primary osteoarthritis, right knee: Secondary | ICD-10-CM | POA: Diagnosis not present

## 2021-01-01 DIAGNOSIS — M19011 Primary osteoarthritis, right shoulder: Secondary | ICD-10-CM | POA: Diagnosis not present

## 2021-01-01 DIAGNOSIS — M353 Polymyalgia rheumatica: Secondary | ICD-10-CM | POA: Diagnosis not present

## 2021-01-01 DIAGNOSIS — M19012 Primary osteoarthritis, left shoulder: Secondary | ICD-10-CM | POA: Diagnosis not present

## 2021-01-01 DIAGNOSIS — M069 Rheumatoid arthritis, unspecified: Secondary | ICD-10-CM | POA: Diagnosis not present

## 2021-01-05 DIAGNOSIS — E1142 Type 2 diabetes mellitus with diabetic polyneuropathy: Secondary | ICD-10-CM | POA: Diagnosis not present

## 2021-01-06 ENCOUNTER — Other Ambulatory Visit: Payer: Self-pay | Admitting: *Deleted

## 2021-01-06 DIAGNOSIS — M160 Bilateral primary osteoarthritis of hip: Secondary | ICD-10-CM | POA: Diagnosis not present

## 2021-01-06 DIAGNOSIS — M353 Polymyalgia rheumatica: Secondary | ICD-10-CM | POA: Diagnosis not present

## 2021-01-06 DIAGNOSIS — J441 Chronic obstructive pulmonary disease with (acute) exacerbation: Secondary | ICD-10-CM

## 2021-01-06 DIAGNOSIS — M1711 Unilateral primary osteoarthritis, right knee: Secondary | ICD-10-CM | POA: Diagnosis not present

## 2021-01-06 DIAGNOSIS — M069 Rheumatoid arthritis, unspecified: Secondary | ICD-10-CM | POA: Diagnosis not present

## 2021-01-06 DIAGNOSIS — M19011 Primary osteoarthritis, right shoulder: Secondary | ICD-10-CM | POA: Diagnosis not present

## 2021-01-06 DIAGNOSIS — M19012 Primary osteoarthritis, left shoulder: Secondary | ICD-10-CM | POA: Diagnosis not present

## 2021-01-06 NOTE — Patient Outreach (Signed)
[facility]) Care Management  01/06/2021  [name] January 20, 1935 [id number]   Telephone Assessment Transition to a Health Coach  RN spoke with pt's POA son [name] today who reports pt continues to do well with no acute issues over the last month. [facility] PT will be ending today however resume in 60 days if not sooner based upon pt's mobility with no falls. No issues or flare ups with pt's COPD (GREEN zone) as she continue to utilize her inhalers and medications accordingly. Based upon pt's progress discussed a Health Coach with [facility] for ongoing COPD management of care. POA [name]) receptive to this referral along with other referral discussed today for respite services.   Reviewed all medication as caregiver indicates pt's Cymbalta was increased to 60 mg. Other preventive measures related to toenail "clipping" that is covered 3 X monthly reported by caregiver due to pt's limited mobility however no falls or related injuries reported.   Will make a referral for care-guides for respite services and a health coach. Will notify the provider n referral for Health Coach via ongoing [facility] services. Son very grateful for the ongoing management of care with [facility] services. No other issues or needs presented at this time that has not been addressed.  Raina Mina, RN Care Management Coordinator [place] Office [phone]

## 2021-01-12 ENCOUNTER — Telehealth: Payer: Self-pay

## 2021-01-12 NOTE — Telephone Encounter (Signed)
   Telephone encounter was:  Successful.  01/12/2021 Name: SHAMIYAH NGU MRN: [id number] DOB: 11-May-1934  CATLYNN GRONDAHL is a 86 y.o. year old female who is a primary care patient of Shon Baton, MD . The community resource team was consulted for assistance with  Respite Care  Care guide performed the following interventions:  Initial in-take: Son is looking for someone who will assist with respite care for pt. They are looking for facilites who will accept Medicare and who will also accept mom not being able to use restroom on her own. One facility denied her because of this.  Follow Up Plan:  Care guide will follow up with patient by phone over the next few days to provide further information on found facilities.  [place] management  [place], [place]  Main Phone: [phone]  E-mail: [email or web address]  Website: [email or web address]

## 2021-01-14 ENCOUNTER — Other Ambulatory Visit: Payer: Self-pay | Admitting: *Deleted

## 2021-01-28 ENCOUNTER — Encounter: Payer: Self-pay | Admitting: *Deleted

## 2021-01-28 ENCOUNTER — Other Ambulatory Visit: Payer: Self-pay | Admitting: *Deleted

## 2021-01-28 DIAGNOSIS — I10 Essential (primary) hypertension: Secondary | ICD-10-CM

## 2021-01-28 NOTE — Patient Outreach (Signed)
[facility]) Care Management  01/28/2021  Jeanae P Altamura 11/21/1934 [id number]   [email or web address] or down load app on smart phone  [employer] website lists multiple social resources for individuals such as: food, health, money, house hold goods, transit, medical supplies, job training and legal services.  The [facility] Call Center provides information and referral services to aging adults 65+ in [place]. If there are waiting lists for community services, or if services are not available, [facility] connects clients with [employer] volunteers close to them who provide services such as respite care, wheelchair ramp construction, friendly visits, and transportation assistance. [facility]'s Call Center fields more than 350 calls each month.  AAIRS*- and SHIIP*-certified Call Center Specialists are ready to lend a compassionate ear and seek resources Monday through Friday, 9:00 am - 5:00 pm. The Call Center has met the needs of aging adults in all of [place] 100 counties. No religious affiliation is required; the only eligibility criterion is that clients be 65+ or older. Contact [facility] for help. *Alliance of Information and Referral Systems *Seniors' Health Insurance Information Program Need help? Call [phone] today!  Housing, home repair, transportation, food, resources for caregivers, senior and disability centers, adult day care, long term care options. https://www.nc211.org/ Can't find the help you need here? Dial 2-1-1 Or 1-888-892-1162, TTY # [phone] CONTACT  If you are looking for programs or services in your community, please dial 2-1-1 or use our online Search Tool.  

## 2021-01-29 NOTE — Patient Instructions (Addendum)
Visit Information  Thank you for taking time to visit with me today. Please don't hesitate to contact me if I can be of assistance to you before our next scheduled telephone appointment.  Following are the goals we discussed today:  Patient Goals/Self-Care Activities: Take all medications as prescribed Attend all scheduled provider appointments Call provider office for new concerns or questions  identify and remove indoor air pollutants develop a rescue plan eliminate symptom triggers at home follow rescue plan if symptoms flare-up eat healthy/prescribed diet: to maintain weight; drink nutritional supplements as needed; ensure adequate fluid/water is consumed Review e-mail sent regarding respite care and senior resources [place] and request that a SW assist with respite care needs as well as financial strain difficulties A community care coordinator will contact you to assist with respite care and financial needs.    The patient verbalized understanding of instructions, educational materials, and care plan provided today and agreed to receive a mailed copy of patient instructions, educational materials, and care plan.   Telephone follow up appointment with care management team member scheduled [id number]  Emelia Loron RN, [place] [phone] [email or web address]

## 2021-01-29 NOTE — Patient Outreach (Signed)
[facility]) Care Management  01/28/21  [name] 17-Dec-1934 [id number]  [facility]) Care Management RN Health Coach Note   01/29/2021 Name:  [name] MRN:  [id number] DOB:  1934/07/10  Summary: [name] POA reports that the patient is breathing well. She no longer needs her inhalers and uses her oxygen at 3 liters [place] at night. Dellis Filbert explains he is in need of respite care relief and that patient and he are have financial strain issues. Nurse sent son an e-mail as requested for respite care and senior resources. A referral was sent to community care coordinator for respite care and financial strain assistance. Dellis Filbert states that the patient continues to be followed by [facility] team. Son explains that the patient continues to eat well and is staying hydrated. Son reports that the patient is not ambulatory and that she has all the DME needed at this time.  Recommendations/Changes made from today's visit: follow rescue plan if respiratory symptoms flare-up eat healthy/prescribed diet: to maintain weight; drink nutritional supplements as needed; ensure adequate fluid/water is consumed Review e-mail sent regarding respite care and senior resources [place] and request that a SW assist with respite care needs as well as financial strain difficulties A community care coordinator will contact you to assist with respite care and financial needs.    Subjective: [name] is an 86 y.o. year old female who is a primary patient of Shon Baton, MD. The care management team was consulted for assistance with care management and/or care coordination needs.    RN Health Coach completed Telephone Visit today.   Objective:  Medications Reviewed Today     Reviewed by Michiel Cowboy, RN (Registered Nurse) on 01/28/21 at [place] List Status: <None>   Medication Order Taking? Sig Documenting Provider Last  Dose Status Informant  acetaminophen (TYLENOL) 325 MG tablet [id number] Yes Take 2 tablets (650 mg total) by mouth every 6 (six) hours as needed for mild pain or moderate pain (or Fever >/= 101). Oswald Hillock, MD Taking Active   albuterol (VENTOLIN HFA) 108 (90 Base) MCG/ACT inhaler [id number] Yes Inhale 2 puffs into the lungs every 4 (four) hours as needed for wheezing or shortness of breath.  [provider] Taking Active Child  cholecalciferol (VITAMIN D3) 25 MCG (1000 UNIT) tablet [id number] Yes Take 1,000 Units by mouth daily. [provider] Taking Active Child  clopidogrel (PLAVIX) 75 MG tablet [id number] No Take 1 tablet (75 mg total) by mouth daily with breakfast.  Patient not taking: Reported on 01/06/2021   Dhungel, Flonnie Overman, MD Not Taking Active   donepezil (ARICEPT) 10 MG tablet [id number] Yes Take 10 mg by mouth at bedtime. [provider] Taking Active Child  DULoxetine (CYMBALTA) 60 MG capsule [id number] Yes Take 90 mg by mouth every morning. [provider] Taking Active Child           Med Note (MATTHEWS, LISA D   Tue Jan 06, 2021  1:29 PM) Pt taking differently  fluticasone furoate-vilanterol (BREO ELLIPTA) 200-25 MCG/INH AEPB [id number] No Inhale 1 puff into the lungs daily.  Patient not taking: Reported on 01/28/2021   [provider] Not Taking Active Child  folic acid (FOLVITE) 1 MG tablet [id number] Yes Take 1 tablet (1 mg total) by mouth 2 (two) times daily. Cathlyn Parsons, PA-C Taking Active Child  glimepiride (AMARYL) 2 MG tablet [id number] Yes Take 2 mg by mouth every  other day. [provider] Taking Active   lamoTRIgine (LAMICTAL) 150 MG tablet [id number] Yes Take 150 mg by mouth every morning. [provider] Taking Active Child  levothyroxine (SYNTHROID, LEVOTHROID) 100 MCG tablet [id number] Yes Take 1 tablet (100 mcg total) by mouth daily. Cathlyn Parsons, PA-C Taking Active Child  losartan (COZAAR) 50 MG tablet  [id number] Yes Take 50 mg by mouth daily. Take 1 tablet 5 days a week then other 2 days take mg daily [provider] Taking Active Child  lurasidone (LATUDA) 40 MG TABS tablet [id number] Yes Take 40 mg by mouth every evening. [provider] Taking Active   QUEtiapine (SEROQUEL) 25 MG tablet [id number] Yes Take 25 mg by mouth at bedtime. [provider] Taking Active   vitamin B-12 (CYANOCOBALAMIN) 1000 MCG tablet [id number] Yes Take 1 tablet (1,000 mcg total) by mouth daily.  Patient taking differently: Take 5,000 mcg by mouth every morning.   Angiulli, Lavon Paganini, PA-C Taking Active              SDOH:  (Social Determinants of Health) assessments and interventions performed:  SDOH Interventions    Flowsheet Row Most Recent Value  SDOH Interventions   Food Insecurity Interventions Other (Comment)  [referral to community care guide for food and financial strain]  Financial Strain Interventions --  [referral to community care guide for food and financial strain]       Care Plan  Review of patient past medical history, allergies, medications, health status, including review of consultants reports, laboratory and other test data, was performed as part of comprehensive evaluation for care management services.   Care Plan : COPD (Adult)  Updates made by Michiel Cowboy, RN since 01/29/2021 12:00 AM     Problem: Psychological Adjustment to Diagnosis (COPD) Resolved 01/29/2021     Problem: Disease Progression (COPD) Resolved 01/29/2021  Priority: Medium     Long-Range Goal: Disease Progression Minimized or Managed Completed 01/28/2021  Start Date: 03/04/2020  Expected End Date: 01/21/2021  Recent Progress: On track  Priority: Medium  Note:   Resolving due to duplicate goal  Evidence-based guidance:  Identify current smoking/tobacco use; provide smoking cessation intervention.  Assess symptom control by the frequency and type of symptoms, reliever use and  activity limitation at every encounter.  Assess risk for exacerbation (flare up) by evaluating spirometry, pulse oximetry, reliever use, presentation of symptoms and activity limitation; anticipate treatment adjustment based on risks and resources.  Develop and/or review and reinforce use of COPD rescue (action) plan even when symptoms are controlled or infrequent.  Ask patient to bring inhaler to all visits; assess and reinforce correct technique; address barriers to proper inhaler use, such as older age, use of multiple devices and lack of understanding.   Identify symptom triggers, such as smoking, virus, weather change, emotional upset, exercise, obesity and environmental allergen; consider reduction of work-exposure versus elimination to avoid compromising employment.  Correlate presentation to comorbidity, such as diabetes, heart failure, obstructive sleep apnea, depression and anxiety, which may worsen symptoms.  Prepare for individualized pharmacologic therapy that may include LABA (long-acting beta-2 agonist), LAMA (long-acting muscarinic antagonist), SABA (short-acting beta-2 agonist) oral or inhaled corticosteroid.  Promote participation in pulmonary rehabilitation for breathing exercises, skills training, improved exercise capacity, mood and quality of life; address barriers to participation.  Promote physical activity or exercise to improve or maintain exercise capacity, based on tolerance that may include walking, water exercise, cycling or limb muscle strength training.  Promote use  of energy conservation and activity pacing techniques.  Promote use of breathing and coughing techniques, such as inspiratory muscle training, pursed-lip breathing, diaphragmatic breathing, pranayama yoga breathing or huff cough.  Screen for malnutrition risk factors, such as unintentional weight loss and poor oral intake; refer to dietitian if identified.  Consider recommendation for oral drink supplement or  multivitamin and mineral supplements if suspect inadequate oral intake or micronutrient deficiencies.   Screen for obstructive sleep apnea; prepare patient for polysomnography based on risk and presentation.  Prepare patient for use of long-term oxygen and noninvasive ventilation to relieve hypercapnia, hypoxemia, obstructive sleep apnea and reduce work of breathing.  Prepare patient with worsening disease for surgical interventions that may include bronchoscopy, lung volume reduction surgery, bullectomy or lung transplantation.   Notes:     Task: Alleviate Barriers to COPD Management Completed 01/29/2021  Due Date: 01/21/2021  Priority: Routine  Note:   Care Management Activities:    - barriers to treatment managed - breathing techniques encouraged - medication-adherence assessment completed - rescue (action) plan reviewed - screen for functional limitations completed and reviewed - self-awareness of symptom triggers encouraged    Notes:  8/17:Continue home O2 via 3 liters at night only and continue to do well. No barriers mentioned at this time as pt remains in the GREEN zone with no acute symptoms presented. No needs as pt continues to follow the plan of care. 4/13- Some barriers discussed as son [name] intervening with [employer] via pt's home O2. Requesting other Home O2 agencies. Provider other sources for Home O2. Pt doing well however remains very weak even with PT via [facility] with more ROM then ambulating. Pt mostly wheel chair bound. Feb- Several barriers however son [name] has the needed resources for resolution. Several services and agencies involved assisting this pt at this time.     Problem: Symptom Exacerbation (COPD) Resolved 01/29/2021  Priority: Medium     Goal: Symptom Exacerbation Prevented or Minimized Completed 01/29/2021  Start Date: 03/04/2020  Expected End Date: 01/21/2021  Recent Progress: On track  Priority: Medium  Note:   Resolving due to duplicate  goal  Evidence-based guidance:  Monitor for signs of respiratory infection, including changes in sputum color, volume and thickness, as well as fever.  Encourage infection prevention strategies that may include prophylactic antibiotic therapy for patients with history of frequent exacerbations or antibiotic administration during exacerbation based on presentation, risk and benefit.  Encourage receipt of influenza and pneumococcal vaccine.  Prepare patient for use of home long-term oxygen therapy in presence of sever resting hypoxemia.  Prepare patients for laboratory studies or diagnostic exams, such as spirometry, pulse oximetry and arterial blood gas based on current symptoms, risk factors and presentation.  Assess barriers and manage adherence, including inhaler technique and persistent trigger exposure; encourage adherence, even when symptoms are controlled or infrequent.  Assess and monitor for signs/symptoms of psychosocial concerns, such as shortness of breath-anxiety cycle or depression that may impact stability of symptoms.  Identify economic resources, sociocultural beliefs, social factors and health literacy that may interfere with adherence.  Promote lifestyle changes when needed, including regular physical activity based on tolerance, weight loss, healthy eating and stress management.  Consider referral to nurse or community health worker or home-visiting program for intensive support and education (disease-management program).  Increase frequency of follow-up following exacerbation or hospitalization; consider transition of care interventions, such as hospital visit, home visit, telephone follow-up, review of discharge summary and resource referrals.   Notes:  FEB-Verified  pt remains in the GREEN with her COPD with no acute symptoms since the last conversation with the caregiver son [name]).  Will continue to stress the importance of prevention measures by avoid individuals who are sick  and administer al prescribed medications as ordered. Due to the constance contacts with involved agencies will continue to extend to goal.    Task: Identify and Minimize Risk of COPD Exacerbation Completed 01/29/2021  Due Date: 01/21/2021  Priority: Routine  Note:   Care Management Activities:    - barriers to lifestyle changes reviewed and addressed - barriers to treatment reviewed and addressed - breathing techniques encouraged - healthy lifestyle promoted - rescue (action) plan reviewed - signs/symptoms of infection reviewed - symptom triggers identified - treatment plan reviewed    Notes:  10/17- Verified pt continues to do well with no changes as POA indicated pt remains "stable".  Remains in the GREEN zone on her O2 at nights as prescribed. 9/16-POA son [name] indicates pt is doing well with no acute symptoms. Pt remains in the GREEN zone today and continue to use her home O2 at night on 3 liters and PRN during the day is needed. 8/17: No related symptoms as pt in the GREEN zone with her COPD with no acute symptoms over the last month. Pt remains on 3 liters with her self paid home O2 used only at night.  6/10-Will continue to encouraged ongoing adherence in assisting pt with her ongoing management of care. Will encouraged use of her home O2 to prevent acute COPD exacerbation. Verified pt remains in the GREEN zone with her COPD.  5/12-Pt continues to do well with no acute symptoms. Pt no longer using inhalers or nebulizer due to use of her home O2 which continue to maintain her COPD as pt remains in the GREEN zone with no acute symptoms. Issues with oxygen company as son will be changing over the [place] for pt's ongoing supplies soon.  4/13-Continue to verify pt is at low risk with exacerbated episodes staying indoors to avoid seasonal allergies and using her ongoing home O2. Denies any flare-ups however pt remains very weak and continue to work with [facility] for PT services.   3/10-Verified pt remains in the GREEN zone with no acute symptoms at this time. Pt continue to take the prescribed antibiotics for her pneumonia but caregiver denies any other symptoms related to COPD flare ups. Pt continue to avoid triggers that may exacerbate symptoms and uses her inhalers 3-4 times daily on the emergency when needed. Healthy lifestyle continues to be discussed alone with the goals and interventions related to managing her CHF.  Feb- Will continue to verified caregiver [name]) is lower the risk to prevent pt from any flare up related to her COPD.Pt remains in the GREEN zone on the action plan at this time. Several services and agencies involved assisting this pt at this time.     Care Plan : RN Care Manager Plan of Care  Updates made by Michiel Cowboy, RN since 01/29/2021 12:00 AM     Problem: Assistance with Management of COPD and Palliative Care   Priority: High     Long-Range Goal: Development of Plan of Care for Management of COPD and Palliative Care   Start Date: 01/29/2021  Expected End Date: 02/20/2022  Priority: High  Note:   Current Barriers:  Chronic Disease Management support and education needs related to COPD  Financial Constraints  Palliative Care  RNCM Clinical Goal(s):  Patient will  demonstrate Ongoing adherence to prescribed treatment plan for COPD as evidenced by continuation of home oxygen 3 liters at night and as needed; contacting provider as needed for respiratory exacerbations continue to work with RN Care Manager to address care management and care coordination needs related to  COPD and palliative care needs as evidenced by adherence to CM Team Scheduled appointments work with community resource care guide to address needs related to  Financial constraints related to bill assistance, food insecurity, respite care as evidenced by patient and/or community resource care guide support through collaboration with Consulting civil engineer, provider, and care team.   Continuation of [facility] Palliative [facility] Services   Interventions: Inter-disciplinary care team collaboration (see longitudinal plan of care) Evaluation of current treatment plan related to  self management and patient's adherence to plan as established by provider Nurse will send COPD color zones and rescue action plans Sent an e-mail as requested to son Lyndon Code regarding respite care/ senior resources Referral to community care coordinator   COPD Interventions:  (Status:  Goal on track:  Yes.) Long Term Goal Provided patient with basic written and verbal COPD education on self care/management/and exacerbation prevention Advised patient to track and manage COPD triggers Provided instruction about proper use of medications used for management of COPD including inhalers Provided education about and advised patient to utilize infection prevention strategies to reduce risk of respiratory infection Referral made to community resources care guide team for assistance with financial strain, food insecurity, respite care Screening for signs and symptoms of depression related to chronic disease state  Encouraged son to continue to use oxygen nightly and as needed  Patient Goals/Self-Care Activities: Take all medications as prescribed Attend all scheduled provider appointments Call provider office for new concerns or questions  identify and remove indoor air pollutants develop a rescue plan eliminate symptom triggers at home follow rescue plan if symptoms flare-up eat healthy/prescribed diet: to maintain weight; drink nutritional supplements as needed; ensure adequate fluid/water is consumed Review e-mail sent regarding respite care and senior resources [place] and request that a SW to assist with respite care needs as well as financial strain difficulties A community care coordinator will contact you to assist with respite care and financial needs.    Follow Up Plan:  Telephone follow up appointment with care management team member scheduled for:  January       Plan: Telephone follow up appointment with care management team member scheduled for:  January  Nurse will send PCP a barrier letter and today's assessment note.  Emelia Loron RN, [place] [phone] [email or web address]

## 2021-02-03 ENCOUNTER — Other Ambulatory Visit: Payer: Self-pay | Admitting: *Deleted

## 2021-02-03 ENCOUNTER — Telehealth: Payer: Self-pay

## 2021-02-03 NOTE — Telephone Encounter (Signed)
° °  Telephone encounter was:  Unsuccessful.  02/03/2021 Name: [name] MRN: [id number] DOB: 06-19-1934  Unsuccessful outbound call made today to assist with:   food, respite and financial  Outreach Attempt:  1st Attempt  A HIPAA compliant voice message was left requesting a return call.  Instructed patient to call back at [phone].  [name], AAS Paralegal, [place] [facility]  [address], [place] [address] ??[email or web address]   ?? [phone]   [email or web address]

## 2021-02-03 NOTE — Patient Outreach (Signed)
[facility]) Care Management  02/03/2021  [name] 1935-01-01 [id number]   RN Health Coach  returned telephone call to [place] at  [phone] . RN left message that [name] son was the one to contact regarding the respite care.  [facility] [phone]

## 2021-02-17 ENCOUNTER — Other Ambulatory Visit: Payer: Self-pay

## 2021-02-17 ENCOUNTER — Other Ambulatory Visit: Payer: Medicare Other

## 2021-02-17 DIAGNOSIS — Z515 Encounter for palliative care: Secondary | ICD-10-CM

## 2021-02-17 NOTE — Progress Notes (Signed)
COMMUNITY PALLIATIVE CARE SW NOTE  PATIENT NAME: [name] DOB: [birth year] MRN: [id number]  PRIMARY CARE PROVIDER: Shon Baton, MD  RESPONSIBLE PARTY:  Acct ID - Guarantor Home Phone Work Phone Relationship Acct Type  [id number] [name], [name] [phone]  Self P/F     [place], [name], [place] [address]   Due to the COVID-19 crisis, this virtual check-in visit was done via telephone from my office and it was initiated and consent by this patient and or family.  PLAN OF CARE and INTERVENTIONS:             GOALS OF CARE/ ADVANCE CARE PLANNING:  Goal is for patient to remain at home. Patient is a DNR.  SOCIAL/EMOTIONAL/SPIRITUAL ASSESSMENT/ INTERVENTIONS:  SW completed a virtual check-in visit via telephone with son/PCG-[name] who provided a status update on patient. [name] advised that his mother is declining an not doing well right now. Patient is totally non-ambulatory at this time. [name] stated that he is transferring patient himself. He stated that patient had three falls since Monday, two of those falls, patient came down on him. [name] states that he has thrown his back out trying to transfer patient. He stated that he is unable to move her if she falls. Patient appetite remains good. She has had some weight loss with her most recent weigh being 161 lbs. He expressed that it is his desire to keep patient home. He was not sure if patient needs physical therapy or they just need more help. Patient is having more increased confusion in the evenings. He has a visit from [employer] where they will bring diapers and chux. He stated they will also refer him to a food bank. SW provided education to [place] regarding hospice, physical therapy consult and mobil meals. SW also scheduled a NP visit for patient for 1/3 @10  am. No other concerns were noted. PATIENT/CAREGIVER EDUCATION/ COPING:  Patient and son are coping adequately, however the son is in need of resources  to keep patient in the home. PERSONAL EMERGENCY PLAN:  911 can be activated for emergencies. COMMUNITY RESOURCES COORDINATION/ HEALTH CARE NAVIGATION:  Patient to be seen by [employer]. FINANCIAL/LEGAL CONCERNS/INTERVENTIONS:  None.     SOCIAL HX:  Social History   Tobacco Use   Smoking status: Former    Types: Cigarettes    Quit date: 11/02/2004    Years since quitting: 16.3   Smokeless tobacco: Never  Substance Use Topics   Alcohol use: No    CODE STATUS: DNR ADVANCED DIRECTIVES: Yes MOST FORM COMPLETE: Yes HOSPICE EDUCATION PROVIDED: Yes, provided.  PPS: Patient is no longer ambulatory. She is having increased confusion.   Duration of telephonic visit and documentation: 30 minutes.   [address] [place], [place]

## 2021-02-18 DIAGNOSIS — I69393 Ataxia following cerebral infarction: Secondary | ICD-10-CM | POA: Diagnosis not present

## 2021-02-18 DIAGNOSIS — R296 Repeated falls: Secondary | ICD-10-CM | POA: Diagnosis not present

## 2021-02-18 DIAGNOSIS — E119 Type 2 diabetes mellitus without complications: Secondary | ICD-10-CM | POA: Diagnosis not present

## 2021-02-18 DIAGNOSIS — F0390 Unspecified dementia without behavioral disturbance: Secondary | ICD-10-CM | POA: Diagnosis not present

## 2021-02-19 ENCOUNTER — Telehealth: Payer: Self-pay

## 2021-02-19 NOTE — Telephone Encounter (Signed)
° °  Telephone encounter was:  Successful.  02/19/2021 Name: NALLA PURDY MRN: [id number] DOB: February 13, 1935  JORDAN CARAVEO is a 86 y.o. year old female who is a primary care patient of Shon Baton, MD . The community resource team was consulted for assistance with  food stamps, food pantries, [place], [place], respite and caregiver resources.   Care guide performed the following interventions: Spoke with patient's son Tida Saner verified email address [email or web address]. Emailed resources for [employer], food pantries, [place], [place], respite and caregiver resources. Letter saved in Epic.     Follow Up Plan:  Care guide will follow up with patient by phone over the next 9-43 days  Makya Phillis, AAS Paralegal, [facility]  [address], [place] [address] ??[email or web address]   ?? [phone]   [email or web address]

## 2021-02-20 ENCOUNTER — Encounter (HOSPITAL_COMMUNITY): Payer: Self-pay

## 2021-02-20 ENCOUNTER — Emergency Department (HOSPITAL_COMMUNITY): Payer: Medicare Other

## 2021-02-20 ENCOUNTER — Other Ambulatory Visit: Payer: Self-pay

## 2021-02-20 ENCOUNTER — Inpatient Hospital Stay (HOSPITAL_COMMUNITY)
Admission: EM | Admit: 2021-02-20 | Discharge: 2021-03-25 | DRG: 308 | Disposition: E | Payer: Medicare Other | Attending: Cardiology | Admitting: Cardiology

## 2021-02-20 ENCOUNTER — Other Ambulatory Visit (HOSPITAL_COMMUNITY): Payer: Medicare Other

## 2021-02-20 DIAGNOSIS — R34 Anuria and oliguria: Secondary | ICD-10-CM | POA: Diagnosis present

## 2021-02-20 DIAGNOSIS — R451 Restlessness and agitation: Secondary | ICD-10-CM | POA: Diagnosis not present

## 2021-02-20 DIAGNOSIS — Z7902 Long term (current) use of antithrombotics/antiplatelets: Secondary | ICD-10-CM

## 2021-02-20 DIAGNOSIS — Z8249 Family history of ischemic heart disease and other diseases of the circulatory system: Secondary | ICD-10-CM

## 2021-02-20 DIAGNOSIS — I452 Bifascicular block: Secondary | ICD-10-CM | POA: Diagnosis present

## 2021-02-20 DIAGNOSIS — G4733 Obstructive sleep apnea (adult) (pediatric): Secondary | ICD-10-CM | POA: Diagnosis present

## 2021-02-20 DIAGNOSIS — Z452 Encounter for adjustment and management of vascular access device: Secondary | ICD-10-CM | POA: Diagnosis not present

## 2021-02-20 DIAGNOSIS — Z96652 Presence of left artificial knee joint: Secondary | ICD-10-CM | POA: Diagnosis present

## 2021-02-20 DIAGNOSIS — Z91048 Other nonmedicinal substance allergy status: Secondary | ICD-10-CM

## 2021-02-20 DIAGNOSIS — F0390 Unspecified dementia without behavioral disturbance: Secondary | ICD-10-CM | POA: Diagnosis present

## 2021-02-20 DIAGNOSIS — K219 Gastro-esophageal reflux disease without esophagitis: Secondary | ICD-10-CM | POA: Diagnosis present

## 2021-02-20 DIAGNOSIS — Z20822 Contact with and (suspected) exposure to covid-19: Secondary | ICD-10-CM | POA: Diagnosis not present

## 2021-02-20 DIAGNOSIS — Z87891 Personal history of nicotine dependence: Secondary | ICD-10-CM

## 2021-02-20 DIAGNOSIS — R4182 Altered mental status, unspecified: Secondary | ICD-10-CM | POA: Diagnosis not present

## 2021-02-20 DIAGNOSIS — Z881 Allergy status to other antibiotic agents status: Secondary | ICD-10-CM

## 2021-02-20 DIAGNOSIS — B9689 Other specified bacterial agents as the cause of diseases classified elsewhere: Secondary | ICD-10-CM

## 2021-02-20 DIAGNOSIS — I459 Conduction disorder, unspecified: Secondary | ICD-10-CM | POA: Diagnosis not present

## 2021-02-20 DIAGNOSIS — Z8711 Personal history of peptic ulcer disease: Secondary | ICD-10-CM

## 2021-02-20 DIAGNOSIS — Z4682 Encounter for fitting and adjustment of non-vascular catheter: Secondary | ICD-10-CM | POA: Diagnosis not present

## 2021-02-20 DIAGNOSIS — N390 Urinary tract infection, site not specified: Secondary | ICD-10-CM | POA: Diagnosis present

## 2021-02-20 DIAGNOSIS — M353 Polymyalgia rheumatica: Secondary | ICD-10-CM | POA: Diagnosis present

## 2021-02-20 DIAGNOSIS — Z8673 Personal history of transient ischemic attack (TIA), and cerebral infarction without residual deficits: Secondary | ICD-10-CM

## 2021-02-20 DIAGNOSIS — G459 Transient cerebral ischemic attack, unspecified: Secondary | ICD-10-CM | POA: Diagnosis present

## 2021-02-20 DIAGNOSIS — N179 Acute kidney failure, unspecified: Secondary | ICD-10-CM | POA: Diagnosis not present

## 2021-02-20 DIAGNOSIS — I462 Cardiac arrest due to underlying cardiac condition: Secondary | ICD-10-CM | POA: Diagnosis not present

## 2021-02-20 DIAGNOSIS — R339 Retention of urine, unspecified: Secondary | ICD-10-CM | POA: Diagnosis present

## 2021-02-20 DIAGNOSIS — Z66 Do not resuscitate: Secondary | ICD-10-CM | POA: Diagnosis present

## 2021-02-20 DIAGNOSIS — J939 Pneumothorax, unspecified: Secondary | ICD-10-CM | POA: Diagnosis not present

## 2021-02-20 DIAGNOSIS — Z515 Encounter for palliative care: Secondary | ICD-10-CM | POA: Diagnosis not present

## 2021-02-20 DIAGNOSIS — I469 Cardiac arrest, cause unspecified: Secondary | ICD-10-CM | POA: Diagnosis not present

## 2021-02-20 DIAGNOSIS — J9811 Atelectasis: Secondary | ICD-10-CM | POA: Diagnosis not present

## 2021-02-20 DIAGNOSIS — E039 Hypothyroidism, unspecified: Secondary | ICD-10-CM | POA: Diagnosis not present

## 2021-02-20 DIAGNOSIS — E785 Hyperlipidemia, unspecified: Secondary | ICD-10-CM | POA: Diagnosis present

## 2021-02-20 DIAGNOSIS — I10 Essential (primary) hypertension: Secondary | ICD-10-CM | POA: Diagnosis not present

## 2021-02-20 DIAGNOSIS — Z0189 Encounter for other specified special examinations: Secondary | ICD-10-CM

## 2021-02-20 DIAGNOSIS — N1831 Chronic kidney disease, stage 3a: Secondary | ICD-10-CM | POA: Diagnosis present

## 2021-02-20 DIAGNOSIS — I129 Hypertensive chronic kidney disease with stage 1 through stage 4 chronic kidney disease, or unspecified chronic kidney disease: Secondary | ICD-10-CM | POA: Diagnosis present

## 2021-02-20 DIAGNOSIS — I471 Supraventricular tachycardia: Secondary | ICD-10-CM | POA: Diagnosis not present

## 2021-02-20 DIAGNOSIS — I442 Atrioventricular block, complete: Principal | ICD-10-CM

## 2021-02-20 DIAGNOSIS — I248 Other forms of acute ischemic heart disease: Secondary | ICD-10-CM | POA: Diagnosis not present

## 2021-02-20 DIAGNOSIS — F03A0 Unspecified dementia, mild, without behavioral disturbance, psychotic disturbance, mood disturbance, and anxiety: Secondary | ICD-10-CM | POA: Diagnosis present

## 2021-02-20 DIAGNOSIS — G40909 Epilepsy, unspecified, not intractable, without status epilepticus: Secondary | ICD-10-CM | POA: Diagnosis present

## 2021-02-20 DIAGNOSIS — G9349 Other encephalopathy: Secondary | ICD-10-CM | POA: Diagnosis not present

## 2021-02-20 DIAGNOSIS — B961 Klebsiella pneumoniae [K. pneumoniae] as the cause of diseases classified elsewhere: Secondary | ICD-10-CM | POA: Diagnosis present

## 2021-02-20 DIAGNOSIS — Z87440 Personal history of urinary (tract) infections: Secondary | ICD-10-CM | POA: Diagnosis not present

## 2021-02-20 DIAGNOSIS — R0902 Hypoxemia: Secondary | ICD-10-CM | POA: Diagnosis not present

## 2021-02-20 DIAGNOSIS — I4510 Unspecified right bundle-branch block: Secondary | ICD-10-CM | POA: Diagnosis not present

## 2021-02-20 DIAGNOSIS — Z79890 Hormone replacement therapy (postmenopausal): Secondary | ICD-10-CM

## 2021-02-20 DIAGNOSIS — Z888 Allergy status to other drugs, medicaments and biological substances status: Secondary | ICD-10-CM

## 2021-02-20 DIAGNOSIS — E1122 Type 2 diabetes mellitus with diabetic chronic kidney disease: Secondary | ICD-10-CM | POA: Diagnosis present

## 2021-02-20 DIAGNOSIS — Z79899 Other long term (current) drug therapy: Secondary | ICD-10-CM

## 2021-02-20 DIAGNOSIS — R55 Syncope and collapse: Secondary | ICD-10-CM | POA: Diagnosis present

## 2021-02-20 DIAGNOSIS — R569 Unspecified convulsions: Secondary | ICD-10-CM | POA: Diagnosis not present

## 2021-02-20 DIAGNOSIS — Z9289 Personal history of other medical treatment: Secondary | ICD-10-CM

## 2021-02-20 DIAGNOSIS — Z743 Need for continuous supervision: Secondary | ICD-10-CM | POA: Diagnosis not present

## 2021-02-20 DIAGNOSIS — R001 Bradycardia, unspecified: Secondary | ICD-10-CM | POA: Diagnosis not present

## 2021-02-20 DIAGNOSIS — J9601 Acute respiratory failure with hypoxia: Secondary | ICD-10-CM | POA: Diagnosis not present

## 2021-02-20 LAB — CBC WITH DIFFERENTIAL/PLATELET
Abs Immature Granulocytes: 0.03 10*3/uL (ref 0.00–0.07)
Basophils Absolute: 0 10*3/uL (ref 0.0–0.1)
Basophils Relative: 1 %
Eosinophils Absolute: 0.2 10*3/uL (ref 0.0–0.5)
Eosinophils Relative: 2 %
HCT: 40.9 % (ref 36.0–46.0)
Hemoglobin: 13 g/dL (ref 12.0–15.0)
Immature Granulocytes: 0 %
Lymphocytes Relative: 23 %
Lymphs Abs: 1.9 10*3/uL (ref 0.7–4.0)
MCH: 32.5 pg (ref 26.0–34.0)
MCHC: 31.8 g/dL (ref 30.0–36.0)
MCV: 102.3 fL — ABNORMAL HIGH (ref 80.0–100.0)
Monocytes Absolute: 0.7 10*3/uL (ref 0.1–1.0)
Monocytes Relative: 8 %
Neutro Abs: 5.5 10*3/uL (ref 1.7–7.7)
Neutrophils Relative %: 66 %
Platelets: 250 10*3/uL (ref 150–400)
RBC: 4 MIL/uL (ref 3.87–5.11)
RDW: 12.6 % (ref 11.5–15.5)
WBC: 8.3 10*3/uL (ref 4.0–10.5)
nRBC: 0 % (ref 0.0–0.2)

## 2021-02-20 LAB — URINALYSIS, ROUTINE W REFLEX MICROSCOPIC
Bilirubin Urine: NEGATIVE
Glucose, UA: NEGATIVE mg/dL
Ketones, ur: NEGATIVE mg/dL
Nitrite: NEGATIVE
Protein, ur: NEGATIVE mg/dL
Specific Gravity, Urine: 1.023 (ref 1.005–1.030)
WBC, UA: >50 WBC/hpf — ABNORMAL HIGH (ref 0–5)
pH: 5 (ref 5.0–8.0)

## 2021-02-20 LAB — COMPREHENSIVE METABOLIC PANEL
ALT: 12 U/L (ref 0–44)
AST: 15 U/L (ref 15–41)
Albumin: 3.6 g/dL (ref 3.5–5.0)
Alkaline Phosphatase: 54 U/L (ref 38–126)
Anion gap: 7 (ref 5–15)
BUN: 20 mg/dL (ref 8–23)
CO2: 28 mmol/L (ref 22–32)
Calcium: 9.1 mg/dL (ref 8.9–10.3)
Chloride: 100 mmol/L (ref 98–111)
Creatinine, Ser: 1.14 mg/dL — ABNORMAL HIGH (ref 0.44–1.00)
GFR, Estimated: 47 mL/min — ABNORMAL LOW (ref >60)
Glucose, Bld: 198 mg/dL — ABNORMAL HIGH (ref 70–99)
Potassium: 4.2 mmol/L (ref 3.5–5.1)
Sodium: 135 mmol/L (ref 135–145)
Total Bilirubin: 0.5 mg/dL (ref 0.3–1.2)
Total Protein: 7.1 g/dL (ref 6.5–8.1)

## 2021-02-20 LAB — RESP PANEL BY RT-PCR (FLU A&B, COVID) ARPGX2
Influenza A by PCR: NEGATIVE
Influenza B by PCR: NEGATIVE
SARS Coronavirus 2 by RT PCR: NEGATIVE

## 2021-02-20 NOTE — Consult Note (Signed)
NEURO HOSPITALIST CONSULT NOTE   Requestig physician: Dr. Tonie Griffith  Reason for Consult: Seizure-like activity  History obtained from:   Patient and Chart     HPI:                                                                                                                                          [name] is an 86 y.o. female with a PMHx of degenerative spinal arthritis, bifascicular bundle branch block, dementia, GERD, right cerebellar infarct in 2014, kidney stones, HLD, HTN, hypothyroidism, OSA, polymyalgia rheumatica, DM and refractory urge incontinence, presenting to the ED after a seizure-like spell at home. Her spell was described by son as eyes rolled back with shaking of limbs and gurgling. No incontinence or tongue-biting per report. She was laid on ground by son and her abnormal movements stopped. Total time of the spell was about 1 minute. She then awoke to a confused state. In the ED she was conversant. When asked, she stated that she could not recall the spell, but remembered waking up afterwards.   On interview by Neurohospitalist, the patient states that she had a seizure once in her lifetime previously, about 4 years ago. She cannot remember the exact details.   Home meds include donepezil, quetiapine, Plavix and Lamictal (150 mg po qam).   Past Medical History:  Diagnosis Date   Abnormality of gait    Arthritis    Bifascicular bundle branch block    chronic   Degenerative spinal arthritis    cervical and lumbar   Dementia (HCC)    early onset   Dyspnea on exertion    GERD (gastroesophageal reflux disease)    History of CVA (cerebrovascular accident)    03-31-2012--  right cerebellar infart without hemorrhgia   History of gastric ulcer    2011  w/ bleed   History of kidney stones    History of squamous cell carcinoma excision    Hyperlipidemia    Hypertension    Hypothyroidism    Obstructive sleep apnea    has not been using -- but  states has appointment soon to get refitted    Polymyalgia rheumatica ([place])    Stroke ([place])    Type 2 diabetes mellitus ([place])    Urge urinary incontinence    refactory    Past Surgical History:  Procedure Laterality Date   APPENDECTOMY  as child   HERNIA REPAIR  1956   INTERSTIM IMPLANT PLACEMENT N/A 11/19/2014   Procedure: INTERSTIM IMPLANT FIRST STAGE AND ;  Surgeon: Bjorn Loser, MD;  [facility];  Service: Urology;  Laterality: N/A;   INTERSTIM IMPLANT PLACEMENT N/A 11/19/2014   Procedure: [place] IMPLANT SECOND STAGE;  Surgeon: Bjorn Loser, MD;  Location: [facility];  Service: Urology;  Laterality: N/A;   ORIF RIGHT WRIST FX  03-04-2004   SHOULDER ARTHROSCOPY / DEBRIDEMENT/  SAD/  DCR/  MINI OPEN ROTATOR CUFF REPAIR Right 01-09-2003   TEE WITHOUT CARDIOVERSION N/A 09/12/2012   Procedure: TRANSESOPHAGEAL ECHOCARDIOGRAM (TEE);  Surgeon: Lelon Perla, MD;  [facility];  Service: Cardiovascular;  Laterality: N/A;  proximal septal thickening and chordal SAM creatin a narrow LVOT; turbulence noted   TONSILLECTOMY  as child   TOTAL KNEE ARTHROPLASTY Left 11-12-2004   TRANSTHORACIC ECHOCARDIOGRAM  09-10-2012   mild LVH, mild  focal basal hypertrophy of the septum,  ef 02-63%, grade I diastolic dysfunction/  trival AR /  MV with moderate systolic anterior motion of the chordal structures/  mild LAE    Family History  Problem Relation Age of Onset   Heart failure Mother    Heart attack Sister             Social History:  reports that she quit smoking about 16 years ago. Her smoking use included cigarettes. She has never used smokeless tobacco. She reports that she does not drink alcohol and does not use drugs.  Allergies  Allergen Reactions   Adhesive [Tape] Other (See Comments)    Irritation and pulls off skin   Levofloxacin     Other reaction(s): not effective   Omeprazole     Other reaction(s): headache     MEDICATIONS:                                                                                                                     No current facility-administered medications on file prior to encounter.   Current Outpatient Medications on File Prior to Encounter  Medication Sig Dispense Refill   acetaminophen (TYLENOL) 500 MG tablet Take 1,000 mg by mouth 2 (two) times daily.     cholecalciferol (VITAMIN D3) 25 MCG (1000 UNIT) tablet Take 1,000 Units by mouth daily.     donepezil (ARICEPT) 10 MG tablet Take 10 mg by mouth at bedtime.     DULoxetine (CYMBALTA) 30 MG capsule Take 30 mg by mouth at bedtime. Takes along with 60 mg to total 90 mg     DULoxetine (CYMBALTA) 60 MG capsule Take 90 mg by mouth at bedtime. Takes along with 30 mg to total 90 mg.     folic acid (FOLVITE) 1 MG tablet Take 1 tablet (1 mg total) by mouth 2 (two) times daily. 60 tablet 1   lamoTRIgine (LAMICTAL) 150 MG tablet Take 150 mg by mouth every morning.     levothyroxine (SYNTHROID, LEVOTHROID) 100 MCG tablet Take 1 tablet (100 mcg total) by mouth daily. 30 tablet 1   nitrofurantoin (MACRODANTIN) 100 MG capsule Take 100 mg by mouth daily. Continuous     OXYGEN Inhale 3 L into the lungs at bedtime.     QUEtiapine (SEROQUEL) 25 MG tablet Take 25 mg by mouth at bedtime.     acetaminophen (TYLENOL) 325 MG tablet  Take 2 tablets (650 mg total) by mouth every 6 (six) hours as needed for mild pain or moderate pain (or Fever >/= 101). (Patient not taking: Reported on 02/21/2021)     albuterol (VENTOLIN HFA) 108 (90 Base) MCG/ACT inhaler Inhale 2 puffs into the lungs every 4 (four) hours as needed for wheezing or shortness of breath.  (Patient not taking: Reported on 01/29/2021)     clopidogrel (PLAVIX) 75 MG tablet Take 1 tablet (75 mg total) by mouth daily with breakfast. (Patient not taking: Reported on 01/06/2021) 30 tablet 0   vitamin B-12 (CYANOCOBALAMIN) 1000 MCG tablet Take 1 tablet (1,000 mcg total) by mouth daily.  (Patient not taking: Reported on 02/14/2021) 30 tablet 1     Scheduled:   stroke: mapping our early stages of recovery book   Does not apply Once   clopidogrel  75 mg Oral Q breakfast   donepezil  10 mg Oral QHS   DULoxetine  90 mg Oral QHS   lamoTRIgine  150 mg Oral q morning   levothyroxine  100 mcg Oral Daily   nitrofurantoin  100 mg Oral Daily   Continuous:  sodium chloride 75 mL/hr at 02/21/21 0252     ROS:                                                                                                                                       In the context of her dementia, the patient does not endorse any current symptoms.    Blood pressure (!) 181/82, pulse 73, temperature 97.9 F (36.6 C), temperature source Oral, resp. rate 20, height 5\' 4"  (1.626 m), weight 72.6 kg, SpO2 94 %.   General Examination:                                                                                                       Physical Exam  HEENT-  [place]/AT   Lungs- Respirations unlabored Extremities- No edema   Neurological Examination Mental Status: Awake and alert. Speech is fluent with intact comprehension for basic commands and questions. Naming intact for a pen, pinky and thumb, but not index finger or ID badge. Pleasant and cooperative. Oriented to "New Year's Eve", the month, the city and state, but not the year.  Cranial Nerves: II: Temporal visual fields intact with no extinction to DSS. PERRL.   III,IV, VI: No ptosis. EOMI with saccadic quality of pursuits noted.  V: Temp sensation equal bilaterally  VII: Smile with subtle  lag on the left.  VIII: Hearing intact to voice IX,X: No hypophonia XI: Symmetric shoulder shrug XII: Midline tongue extension Motor: RUE 4+/5 LUE 4+/5 RLE 4+/5 LLE 4+/5 Sensory: Diminished subjective temp sensation to RUE. Temp and light touch otherwise intact to proximal limbs. No extinction to DSS.   Deep Tendon Reflexes: 2+ bilateral brachioradialis,  patellae and achilles.  Plantars: Cortical toes bilaterally  Cerebellar: No ataxia with FNF bilaterally, but slow and with action tremor.  Gait: Deferred due to falls risk concerns   Lab Results: Basic Metabolic Panel: Recent Labs  Lab 02/01/2021 1917  NA 135  K 4.2  CL 100  CO2 28  GLUCOSE 198*  BUN 20  CREATININE 1.14*  CALCIUM 9.1    CBC: Recent Labs  Lab 01/29/2021 1917  WBC 8.3  NEUTROABS 5.5  HGB 13.0  HCT 40.9  MCV 102.3*  PLT 250    Cardiac Enzymes: No results for input(s): CKTOTAL, CKMB, CKMBINDEX, TROPONINI in the last 168 hours.  Lipid Panel: No results for input(s): CHOL, TRIG, HDL, CHOLHDL, VLDL, LDLCALC in the last 168 hours.  Imaging: CT Head Wo Contrast  Result Date: 02/21/2021 CLINICAL DATA:  Mental status change of unknown cause.  Seizures. EXAM: CT HEAD WITHOUT CONTRAST TECHNIQUE: Contiguous axial images were obtained from the base of the skull through the vertex without intravenous contrast. COMPARISON:  11/29/2014 FINDINGS: Brain: Prominent diffuse cerebral atrophy. Ventricular dilatation consistent with central atrophy. Low-attenuation changes in the deep white matter consistent with small vessel ischemia. Old right cerebellar infarcts. Old lacunar infarcts in the left internal capsule, progressed since prior study. No mass effect or midline shift. No abnormal extra-axial fluid collections. Gray-white matter junctions are distinct. Basal cisterns are not effaced. No acute intracranial hemorrhage. Vascular: Moderate intracranial arterial vascular calcifications. Skull: Calvarium appears intact. Sinuses/Orbits: Paranasal sinuses and mastoid air cells are clear. Other: None. IMPRESSION: No acute intracranial abnormalities. Chronic atrophy and small vessel ischemic changes. Old infarcts as discussed. Electronically Signed   By: Lucienne Capers M.D.   On: 02/02/2021 21:47   DG Chest Port 1 View  Result Date: 02/01/2021 CLINICAL DATA:  Seizure. History of  dementia, hypertension and diabetes. EXAM: PORTABLE CHEST 1 VIEW COMPARISON:  07/07/2019 FINDINGS: Cardiac silhouette is normal in size. No mediastinal or hilar masses. Minor linear scar atelectasis at the left lung base, stable. Lungs otherwise clear. No pleural effusion or pneumothorax. Skeletal structures are grossly intact. IMPRESSION: No active disease. Electronically Signed   By: Lajean Manes M.D.   On: 01/31/2021 18:40    Assessment: 86 year old female presenting after a shaking spell at home with semiology suggestive of seizure. Decreased mobility, worsened confusion at home for the past 2 days. Had a prior spell of decreased responsiveness in the past without shaking. No history of seizures.  1. Exam reveals mild naming deficit and mild disorientation. No focal motor weakness noted. Mild temperature sensation deficit to RUE. Mild tremor. No clinical seizure activity noted.  2. Semiology of event at home: Her spell was described by son as eyes rolled back with shaking of limbs and gurgling. No incontinence or tongue-biting per report. She was laid on ground by son and her abnormal movements stopped. Total time of the spell was about 1 minute. She then awoke to a confused state. In the ED she was conversant. When asked, she stated that she could not recall the spell, but remembered waking up afterwards.  3. Home meds include donepezil, quetiapine, Plavix and Lamictal (150  mg po qam).  4. Uncertain if her Lamictal is for seizure control, but doubt that is the indication as she is taking it only once per day. May be for mood stabilization. Will need to interview family regarding this in the AM. No family available at time of bedside evaluation.  5. CT head:  Prominent diffuse cerebral atrophy. Ventricular dilatation consistent with central atrophy. Low-attenuation changes in the deep white matter consistent with small vessel ischemia. Old right cerebellar infarcts. Old lacunar infarcts in the left  internal capsule, progressed since prior study.   Recommendations: 1. Unable to obtain MRI due to spinal stimulator 2. EEG in AM (ordered) 3. Continue Lamictal. Will determine if it needs to be changed to BID dosing for seizure management pending results of EEG and family interview regarding the indication for the once per day dosing, which would be atypical for seizure control with more likely indication being for mood stabilization.  4. Inpatient seizure precautions.  5. B12 level, TSH, ammonia level (ordered)  Electronically signed: Dr. Kerney Elbe 01/23/2021, 11:42 PM

## 2021-02-20 NOTE — ED Provider Notes (Signed)
[facility] EMERGENCY DEPARTMENT Provider Note   CSN: [id number] Arrival date & time: 01/29/2021  1545     History Chief Complaint  Patient presents with   Seizures    [name] is a 86 y.o. female.  HPI  86 year old female with past medical history of dementia, HTN, HLD, DM, previous TIA presents emergency department accompanied by the son after a shaking episode.  Patient states that she was sitting up and watching TV.  The next thing she remembers is that she was lying on the ground.  Movements do that she started having tremors/shaking of her upper arms and body.  She became out of it, unclear if there was loss of consciousness.  The son lowered the patient to the ground on her back and she was having gurgling sounds.  Following this the patient came around and was alert, oriented.  There was never any focal facial droop or limb weakness.  They state that her speech is slower but otherwise baseline.  This speech change has been ongoing intermittently even before the syncopal event per the daughter.  Son feels like it is more pronounced now.  Past Medical History:  Diagnosis Date   Abnormality of gait    Arthritis    Bifascicular bundle branch block    chronic   Degenerative spinal arthritis    cervical and lumbar   Dementia (HCC)    early onset   Dyspnea on exertion    GERD (gastroesophageal reflux disease)    History of CVA (cerebrovascular accident)    03-31-2012--  right cerebellar infart without hemorrhgia   History of gastric ulcer    2011  w/ bleed   History of kidney stones    History of squamous cell carcinoma excision    Hyperlipidemia    Hypertension    Hypothyroidism    Obstructive sleep apnea    has not been using -- but states has appointment soon to get refitted    Polymyalgia rheumatica ([place])    Stroke ([place])    Type 2 diabetes mellitus ([place])    Urge urinary incontinence    refactory    Patient Active Problem List   Diagnosis  Date Noted   Generalized weakness 07/06/2019   AKI (acute kidney injury) ([place]) 07/06/2019   Dehydration 07/06/2019   Accidental fall 07/06/2019   Dementia without behavioral disturbance ([place]) 07/06/2019   TIA (transient ischemic attack) 09/08/2012   Cerebral infarction ([place]) 04/04/2012   Cerebellar stroke, acute ([place]) 04/01/2012   Ataxia following cerebral infarction 04/01/2012   Nausea and vomiting 04/01/2012   Polymyalgia rheumatica ([place]) 04/01/2012   Bifascicular block    Hypertension    Hyperlipidemia    Obstructive sleep apnea    Diabetes mellitus ([place])     Past Surgical History:  Procedure Laterality Date   APPENDECTOMY  as child   [place]   INTERSTIM IMPLANT PLACEMENT N/A 11/19/2014   Procedure: INTERSTIM IMPLANT FIRST STAGE AND ;  Surgeon: Bjorn Loser, MD;  Location: [facility];  Service: Urology;  Laterality: N/A;   INTERSTIM IMPLANT PLACEMENT N/A 11/19/2014   Procedure: [place]rie [place] IMPLANT SECOND STAGE;  Surgeon: Bjorn Loser, MD;  Location: [facility];  Service: Urology;  Laterality: N/A;   ORIF RIGHT WRIST FX  03-04-2004   SHOULDER ARTHROSCOPY / DEBRIDEMENT/  SAD/  DCR/  MINI OPEN ROTATOR CUFF REPAIR Right 01-09-2003   TEE WITHOUT CARDIOVERSION N/A 09/12/2012   Procedure: TRANSESOPHAGEAL ECHOCARDIOGRAM (TEE);  Surgeon: Lelon Perla, MD;  Location: [facility];  Service: Cardiovascular;  Laterality: N/A;  proximal septal thickening and chordal SAM creatin a narrow LVOT; turbulence noted   TONSILLECTOMY  as child   TOTAL KNEE ARTHROPLASTY Left 11-12-2004   TRANSTHORACIC ECHOCARDIOGRAM  09-10-2012   mild LVH, mild  focal basal hypertrophy of the septum,  ef 03-50%, grade I diastolic dysfunction/  trival AR /  MV with moderate systolic anterior motion of the chordal structures/  mild LAE     OB History   No obstetric history on file.     Family History  Problem Relation Age of Onset   Heart failure Mother     Heart attack Sister     Social History   Tobacco Use   Smoking status: Former    Types: Cigarettes    Quit date: 11/02/2004    Years since quitting: 16.3   Smokeless tobacco: Never  Vaping Use   Vaping Use: Never used  Substance Use Topics   Alcohol use: No   Drug use: No    Home Medications Prior to Admission medications   Medication Sig Start Date End Date Taking? Authorizing Provider  acetaminophen (TYLENOL) 500 MG tablet Take 1,000 mg by mouth 2 (two) times daily.   Yes [provider]  cholecalciferol (VITAMIN D3) 25 MCG (1000 UNIT) tablet Take 1,000 Units by mouth daily.   Yes [provider]  donepezil (ARICEPT) 10 MG tablet Take 10 mg by mouth at bedtime.   Yes [provider]  DULoxetine (CYMBALTA) 30 MG capsule Take 30 mg by mouth at bedtime. Takes along with 60 mg to total 90 mg   Yes [provider]  DULoxetine (CYMBALTA) 60 MG capsule Take 90 mg by mouth at bedtime. Takes along with 30 mg to total 90 mg.   Yes [provider]  folic acid (FOLVITE) 1 MG tablet Take 1 tablet (1 mg total) by mouth 2 (two) times daily. 04/10/12  Yes Angiulli, Lavon Paganini, PA-C  lamoTRIgine (LAMICTAL) 150 MG tablet Take 150 mg by mouth every morning.   Yes [provider]  levothyroxine (SYNTHROID, LEVOTHROID) 100 MCG tablet Take 1 tablet (100 mcg total) by mouth daily. 04/10/12  Yes Angiulli, Lavon Paganini, PA-C  nitrofurantoin (MACRODANTIN) 100 MG capsule Take 100 mg by mouth daily. Continuous   Yes [provider]  OXYGEN Inhale 3 L into the lungs at bedtime.   Yes [provider]  QUEtiapine (SEROQUEL) 25 MG tablet Take 25 mg by mouth at bedtime.   Yes [provider]  acetaminophen (TYLENOL) 325 MG tablet Take 2 tablets (650 mg total) by mouth every 6 (six) hours as needed for mild pain or moderate pain (or Fever >/= 101). Patient not taking: Reported on 02/04/2021 07/10/19   Oswald Hillock, MD  albuterol (VENTOLIN HFA)  108 (90 Base) MCG/ACT inhaler Inhale 2 puffs into the lungs every 4 (four) hours as needed for wheezing or shortness of breath.  Patient not taking: Reported on 01/29/2021    [provider]  clopidogrel (PLAVIX) 75 MG tablet Take 1 tablet (75 mg total) by mouth daily with breakfast. Patient not taking: Reported on 01/06/2021 09/12/12   Dhungel, Flonnie Overman, MD  vitamin B-12 (CYANOCOBALAMIN) 1000 MCG tablet Take 1 tablet (1,000 mcg total) by mouth daily. Patient not taking: Reported on 02/04/2021 04/10/12   Cathlyn Parsons, PA-C    Allergies    Adhesive [tape], Levofloxacin, and Omeprazole  Review of Systems  Review of Systems  Constitutional:  Negative for chills and fever.  HENT:  Negative for congestion.   Eyes:  Negative for visual disturbance.  Respiratory:  Negative for shortness of breath.   Cardiovascular:  Negative for chest pain.  Gastrointestinal:  Negative for abdominal pain, diarrhea and vomiting.  Genitourinary:  Negative for dysuria.  Skin:  Negative for rash.  Neurological:  Positive for tremors and speech difficulty. Negative for facial asymmetry, weakness, numbness and headaches.  Psychiatric/Behavioral:  Positive for confusion. Negative for hallucinations.    Physical Exam Updated Vital Signs BP (!) 155/81    Pulse 76    Temp 97.9 F (36.6 C) (Oral)    Resp (!) 23    Ht 5\' 4"  (1.626 m)    Wt 72.6 kg    SpO2 94%    BMI 27.46 kg/m   Physical Exam Vitals and nursing note reviewed.  Constitutional:      Appearance: Normal appearance.  HENT:     Head: Normocephalic.     Mouth/Throat:     Mouth: Mucous membranes are moist.  Eyes:     Pupils: Pupils are equal, round, and reactive to light.  Cardiovascular:     Rate and Rhythm: Normal rate.  Pulmonary:     Effort: Pulmonary effort is normal. No respiratory distress.  Abdominal:     Palpations: Abdomen is soft.     Tenderness: There is no abdominal tenderness.  Musculoskeletal:        General: No  deformity.     Cervical back: No rigidity.  Skin:    General: Skin is warm.  Neurological:     Mental Status: She is alert and oriented to person, place, and time. Mental status is at baseline.     Comments: Slow but otherwise reported baseline speech  Psychiatric:        Mood and Affect: Mood normal.    ED Results / Procedures / Treatments   Labs (all labs ordered are listed, but only abnormal results are displayed) Labs Reviewed  RESP PANEL BY RT-PCR (FLU A&B, COVID) [id number]  CBC WITH DIFFERENTIAL/PLATELET  COMPREHENSIVE METABOLIC PANEL  URINALYSIS, ROUTINE W REFLEX MICROSCOPIC    EKG EKG Interpretation  Date/Time:  Friday February 20 2021 15:46:20 EST Ventricular Rate:  79 PR Interval:  301 QRS Duration: 161 QT Interval:  440 QTC Calculation: 505 R Axis:   -76 Text Interpretation: Sinus rhythm Prolonged PR interval Right bundle branch block Anterior infarct, age indeterminate Similar to previous tracing Confirmed by Lavenia Atlas [phone]) on 02/09/2021 4:20:40 PM  Radiology DG Chest Port 1 View  Result Date: 02/04/2021 CLINICAL DATA:  Seizure. History of dementia, hypertension and diabetes. EXAM: PORTABLE CHEST 1 VIEW COMPARISON:  07/07/2019 FINDINGS: Cardiac silhouette is normal in size. No mediastinal or hilar masses. Minor linear scar atelectasis at the left lung base, stable. Lungs otherwise clear. No pleural effusion or pneumothorax. Skeletal structures are grossly intact. IMPRESSION: No active disease. Electronically Signed   By: Lajean Manes M.D.   On: 01/27/2021 18:40    Procedures Procedures   Medications Ordered in ED Medications - No data to display  ED Course  I have reviewed the triage vital signs and the nursing notes.  Pertinent labs & imaging results that were available during my care of the patient were reviewed by me and considered in my medical decision making (see chart for details).    MDM Rules/Calculators/A&P  86  year old female presents with declining dementia, weakness, confusion and possible syncope vs seizure episode today. Son who lives with the patient says she has been declining in the past couple weeks, more noticeably in the past couple days. Recently on palliative with thoughts of hospice. She has been having decreased mobility and today had a shaking episode with possible LOC. Doesn't sound very typical of seizure but possible. Son feels like her speech has been declining.  Daughter feels like the speech has been intermittently changing even since before this syncope/seizure event but the son does feel like it is more slurred than usual at the moment.  Blood work reassuring, urine has bacteria and leukocytes, nitrite negative. CTH negative.  Unable to do MRI in regards to TIA/CVA as the patient has a spinal stimulator in place.  The patient does not use the spinal stimulator but per MRI they would need the remote and device fully charged to be able to do MRI, or have it removed.  Patient continues to have slightly slurred speech.  Plan for medical admission.  Consulted neurology in regards to syncope versus seizure with possible TIA/CVA changes.  They will evaluate the patient, placed EEG if appropriate.  Patients evaluation and results requires admission for further treatment and care. Patient agrees with admission plan, offers no new complaints and is stable/unchanged at time of admit.      Final Clinical Impression(s) / ED Diagnoses Final diagnoses:  None    Rx / DC Orders ED Discharge Orders     None        Lorelle Gibbs, DO 02/21/21 0004

## 2021-02-20 NOTE — H&P (Signed)
History and Physical    CASSIDI MODESITT HUD:[id number] DOB: Jun 02, 1934 DOA: 02/19/2021  PCP: Shon Baton, MD   Patient coming from: Home  Chief Complaint: Syncope  HPI: [name] is a 86 y.o. female with medical history significant for HTN, hypothyroidism, HLD, hx of CVA, Dementia presents by EMS after having syncopal episode associate with convulsions at home.  Patient had just finished dinner and was sitting down in a chair watching TV when she suddenly stopped talking and her eyes rolled back in her head and she started to have generalized convulsions so her son lowered her to the ground.  Patient reports she remembers sitting in the chair and watching TV and then remembers her son put her on the floor.  Reportedly she had a few intermittent bouts of tremors and generalized shaking and convulsions of her upper body and arms with brief periods of her not taking her breath over a 15 to 20-minute.  While EMS was in route to the house.  Son states there was no facial droop.  She has not had any fevers or recent illness.  She denies any urinary frequency or dysuria.  She does have a history of frequent UTIs and is on nitrofurantoin daily.  No episodes of vomiting.  Has not had any recent falls or head trauma. Lives with son.  No tobacco alcohol or illicit drug use  ED Course: Ms. Willow has been hemodynamically stable in the emergency room.  CBC is unremarkable.  Urinalysis shows some back to area with mild leukocytes but negative nitrites.  Electrolytes are normal.  Creatinine mildly elevated at 1.14.  COVID-negative.  CT of the head showed no acute intracranial process.  MRI was not obtained secondary to patient having a spinal stimulator.  Neurology's been consulted.  Hospitalist service asked to evaluate and manage patient overnight  Review of Systems:  Unable to obtain accurate review of systems secondary to dementia  Past Medical History:  Diagnosis Date   Abnormality of gait     Arthritis    Bifascicular bundle branch block    chronic   Degenerative spinal arthritis    cervical and lumbar   Dementia (HCC)    early onset   Dyspnea on exertion    GERD (gastroesophageal reflux disease)    History of CVA (cerebrovascular accident)    03-31-2012--  right cerebellar infart without hemorrhgia   History of gastric ulcer    2011  w/ bleed   History of kidney stones    History of squamous cell carcinoma excision    Hyperlipidemia    Hypertension    Hypothyroidism    Obstructive sleep apnea    has not been using -- but states has appointment soon to get refitted    Polymyalgia rheumatica ([place])    Stroke ([place])    Type 2 diabetes mellitus ([place])    Urge urinary incontinence    refactory    Past Surgical History:  Procedure Laterality Date   APPENDECTOMY  as child   HERNIA REPAIR  1956   INTERSTIM IMPLANT PLACEMENT N/A 11/19/2014   Procedure: INTERSTIM IMPLANT FIRST STAGE AND ;  Surgeon: Bjorn Loser, MD;  Location: [facility];  Service: Urology;  Laterality: N/A;   INTERSTIM IMPLANT PLACEMENT N/A 11/19/2014   Procedure: [place] IMPLANT SECOND STAGE;  Surgeon: Bjorn Loser, MD;  Location: [facility];  Service: Urology;  Laterality: N/A;   ORIF RIGHT WRIST FX  03-04-2004   SHOULDER ARTHROSCOPY / DEBRIDEMENT/  SAD/  DCR/  MINI OPEN ROTATOR CUFF REPAIR Right 01-09-2003   TEE WITHOUT CARDIOVERSION N/A 09/12/2012   Procedure: TRANSESOPHAGEAL ECHOCARDIOGRAM (TEE);  Surgeon: Lelon Perla, MD;  Location[facility] ENDOSCOPY;  Service: Cardiovascular;  Laterality: N/A;  proximal septal thickening and chordal SAM creatin a narrow LVOT; turbulence noted   TONSILLECTOMY  as child   TOTAL KNEE ARTHROPLASTY Left 11-12-2004   TRANSTHORACIC ECHOCARDIOGRAM  09-10-2012   mild LVH, mild  focal basal hypertrophy of the septum,  ef 50-93%, grade I diastolic dysfunction/  trival AR /  MV with moderate systolic anterior motion of the chordal  structures/  mild LAE    Social History  reports that she quit smoking about 16 years ago. Her smoking use included cigarettes. She has never used smokeless tobacco. She reports that she does not drink alcohol and does not use drugs.  Allergies  Allergen Reactions   Adhesive [Tape] Other (See Comments)    Irritation and pulls off skin   Levofloxacin     Other reaction(s): not effective   Omeprazole     Other reaction(s): headache    Family History  Problem Relation Age of Onset   Heart failure Mother    Heart attack Sister      Prior to Admission medications   Medication Sig Start Date End Date Taking? Authorizing Provider  acetaminophen (TYLENOL) 500 MG tablet Take 1,000 mg by mouth 2 (two) times daily.   Yes [provider]  cholecalciferol (VITAMIN D3) 25 MCG (1000 UNIT) tablet Take 1,000 Units by mouth daily.   Yes [provider]  donepezil (ARICEPT) 10 MG tablet Take 10 mg by mouth at bedtime.   Yes [provider]  DULoxetine (CYMBALTA) 30 MG capsule Take 30 mg by mouth at bedtime. Takes along with 60 mg to total 90 mg   Yes [provider]  DULoxetine (CYMBALTA) 60 MG capsule Take 90 mg by mouth at bedtime. Takes along with 30 mg to total 90 mg.   Yes [provider]  folic acid (FOLVITE) 1 MG tablet Take 1 tablet (1 mg total) by mouth 2 (two) times daily. 04/10/12  Yes Angiulli, Lavon Paganini, PA-C  lamoTRIgine (LAMICTAL) 150 MG tablet Take 150 mg by mouth every morning.   Yes [provider]  levothyroxine (SYNTHROID, LEVOTHROID) 100 MCG tablet Take 1 tablet (100 mcg total) by mouth daily. 04/10/12  Yes Angiulli, Lavon Paganini, PA-C  nitrofurantoin (MACRODANTIN) 100 MG capsule Take 100 mg by mouth daily. Continuous   Yes [provider]  OXYGEN Inhale 3 L into the lungs at bedtime.   Yes [provider]  QUEtiapine (SEROQUEL) 25 MG tablet Take 25 mg by mouth at bedtime.   Yes [provider]   acetaminophen (TYLENOL) 325 MG tablet Take 2 tablets (650 mg total) by mouth every 6 (six) hours as needed for mild pain or moderate pain (or Fever >/= 101). Patient not taking: Reported on 02/13/2021 07/10/19   Oswald Hillock, MD  albuterol (VENTOLIN HFA) 108 (90 Base) MCG/ACT inhaler Inhale 2 puffs into the lungs every 4 (four) hours as needed for wheezing or shortness of breath.  Patient not taking: Reported on 01/29/2021    [provider]  clopidogrel (PLAVIX) 75 MG tablet Take 1 tablet (75 mg total) by mouth daily with breakfast. Patient not taking: Reported on 01/06/2021 09/12/12   Dhungel, Flonnie Overman, MD  vitamin B-12 (CYANOCOBALAMIN) 1000 MCG tablet Take 1 tablet (1,000 mcg total) by mouth daily.  Patient not taking: Reported on 02/08/2021 04/10/12   Cathlyn Parsons, PA-C    Physical Exam: Vitals:   02/12/2021 2030 02/13/2021 2045 02/12/2021 2115 02/21/2021 2300  BP: (!) 148/84 (!) 174/88 (!) 166/95 (!) 181/82  Pulse: 73 72 71 73  Resp: 19 20 (!) 22 20  Temp:      TempSrc:      SpO2: 92% 92% 91% 94%  Weight:      Height:        Constitutional: NAD, calm, comfortable Vitals:   02/19/2021 2030 02/07/2021 2045 01/26/2021 2115 02/13/2021 2300  BP: (!) 148/84 (!) 174/88 (!) 166/95 (!) 181/82  Pulse: 73 72 71 73  Resp: 19 20 (!) 22 20  Temp:      TempSrc:      SpO2: 92% 92% 91% 94%  Weight:      Height:       General: WDWN, Alert and oriented to self.  Eyes: EOMI, PERRL, conjunctivae normal.  Sclera nonicteric HENT:  [place]/AT, external ears normal.  Nares patent without epistasis.  Mucous membranes are moist. Posterior pharynx clear of any exudate. Tongue with normal extension. Neck: Soft, normal range of motion, supple, no masses, no thyromegaly.  Trachea midline Respiratory: clear to auscultation bilaterally, no wheezing, no crackles. Normal respiratory effort. No accessory muscle use.  Cardiovascular: Regular rate and rhythm, no murmurs / rubs / gallops. No extremity edema. 1+ pedal  pulses. No carotid bruits.  Abdomen: Soft, no tenderness, nondistended, no rebound or guarding. Bowel sounds normoactive Musculoskeletal: FROM. no cyanosis. Normal muscle tone.  Skin: Warm, dry, intact no rashes, lesions, ulcers. No induration Neurologic: CN 2-12 grossly intact.  Normal speech. Sensation intact,to touch patella DTR +1 bilaterally. Strength equal in all extremities.  Moves all 4 extremities spontaneously.  No pronator drift. Psychiatric: Normal mood.    Labs on Admission: I have personally reviewed following labs and imaging studies  CBC: Recent Labs  Lab 02/04/2021 1917  WBC 8.3  NEUTROABS 5.5  HGB 13.0  HCT 40.9  MCV 102.3*  PLT 540    Basic Metabolic Panel: Recent Labs  Lab 01/31/2021 1917  NA 135  K 4.2  CL 100  CO2 28  GLUCOSE 198*  BUN 20  CREATININE 1.14*  CALCIUM 9.1    GFR: Estimated Creatinine Clearance: 35.9 mL/min (A) (by C-G formula based on SCr of 1.14 mg/dL (H)).  Liver Function Tests: Recent Labs  Lab 01/27/2021 1917  AST 15  ALT 12  ALKPHOS 54  BILITOT 0.5  PROT 7.1  ALBUMIN 3.6    Urine analysis:    Component Value Date/Time   COLORURINE YELLOW 02/16/2021 2307   APPEARANCEUR HAZY (A) 02/13/2021 2307   LABSPEC 1.023 01/27/2021 2307   PHURINE 5.0 02/01/2021 2307   GLUCOSEU NEGATIVE 01/24/2021 2307   HGBUR SMALL (A) 01/27/2021 2307   BILIRUBINUR NEGATIVE 01/29/2021 2307   KETONESUR NEGATIVE 02/12/2021 2307   PROTEINUR NEGATIVE 02/01/2021 2307   UROBILINOGEN 1.0 03/27/2009 1124   NITRITE NEGATIVE 01/23/2021 2307   LEUKOCYTESUR LARGE (A) 02/12/2021 2307    Radiological Exams on Admission: CT Head Wo Contrast  Result Date: 02/14/2021 CLINICAL DATA:  Mental status change of unknown cause.  Seizures. EXAM: CT HEAD WITHOUT CONTRAST TECHNIQUE: Contiguous axial images were obtained from the base of the skull through the vertex without intravenous contrast. COMPARISON:  11/29/2014 FINDINGS: Brain: Prominent diffuse cerebral  atrophy. Ventricular dilatation consistent with central atrophy. Low-attenuation changes in the deep white matter consistent with small vessel  ischemia. Old right cerebellar infarcts. Old lacunar infarcts in the left internal capsule, progressed since prior study. No mass effect or midline shift. No abnormal extra-axial fluid collections. Gray-white matter junctions are distinct. Basal cisterns are not effaced. No acute intracranial hemorrhage. Vascular: Moderate intracranial arterial vascular calcifications. Skull: Calvarium appears intact. Sinuses/Orbits: Paranasal sinuses and mastoid air cells are clear. Other: None. IMPRESSION: No acute intracranial abnormalities. Chronic atrophy and small vessel ischemic changes. Old infarcts as discussed. Electronically Signed   By: Lucienne Capers M.D.   On: 01/24/2021 21:47   DG Chest Port 1 View  Result Date: 02/01/2021 CLINICAL DATA:  Seizure. History of dementia, hypertension and diabetes. EXAM: PORTABLE CHEST 1 VIEW COMPARISON:  07/07/2019 FINDINGS: Cardiac silhouette is normal in size. No mediastinal or hilar masses. Minor linear scar atelectasis at the left lung base, stable. Lungs otherwise clear. No pleural effusion or pneumothorax. Skeletal structures are grossly intact. IMPRESSION: No active disease. Electronically Signed   By: Lajean Manes M.D.   On: 02/21/2021 18:40    EKG: Independently reviewed.  EKG shows normal sinus rhythm with right bundle branch block.  No acute ST elevation or depression.  Prolonged QTc at 505  Assessment/Plan Principal Problem:   Syncope Ms. Ferrero will be observed on medical telemetry floor for TIA/CVA. Will evaluate carotids for large vessel occlusion/stenosis.  Obtain echocardiogram to evaluate for PFO, wall motion and ejection fraction. Hypertension of 220/110 will be allowed for 24 hours per stroke protocol.  After which blood pressure will be slowly reduced to goal level. Antiplatelet therapy with plavix  daily. Check lipid panel.  Neurology consulted.  EEG ordered Neurochecks per stroke protocol  Active Problems:   TIA (transient ischemic attack) As above    Convulsions EEG ordered. Neurology consulted and will evaluate patient.    Hypertension Monitor BP and allow permissive HTN as above tonight.    AKI (acute kidney injury)  IVF with NS overnight. Recheck electrolytes and renal function in am    Dementia without behavioral disturbance  Chronic      Asymptomatic bacteriuria Has bacteria in her urine but no urinary symptoms of hesitancy or frequency.  Urine culture sent to the lab and if positive will start antibiotic at that point     Prolonged QT interval Avoid medications which could further prolong QT interval  DVT prophylaxis: SCDs for DVT prophylaxis.   Code Status:   DNR, CODE STATUS confirmed with patient and her son who is at bedside.  Son reported patient has a living will and does not want to be resuscitated Family Communication:  Gnosis and plan discussed with patient and her son who is at bedside.  Son verbalized understanding agrees with plan.  Further recommendations to follow as clinical indicated Disposition Plan:   Patient is from:  Home  Anticipated DC to:  Home  Anticipated DC date:  Anticipate 2 midnight or less stay  Consults called:  Neurology, Dr. Cheral Marker consulted by ER physician  Admission status:  Observation  Yevonne Aline Katalina Magri MD [facility]  How to contact the [facility] Attending or Consulting provider [place] or covering provider during after hours [place], for this patient?   Check the care team in [facility] and look for a) attending/consulting [facility] provider listed and b) the [facility] team listed Log into [email or web address] and use [place]'s universal password to access. If you do not have the password, please contact the hospital operator. Locate the [facility] provider you are looking for under [facility]  and page to a number that you can be  directly reached. If you still have difficulty reaching the provider, please page the [facility] (Director on Call) for the Hospitalists listed on amion for assistance.  02/02/2021, 11:52 PM

## 2021-02-20 NOTE — ED Triage Notes (Signed)
Picked up from home son states he walked into the room and her top half was shaking for several minutes on ems arrival pt was responsive to pain after 10 mins alert and oriented at baseline with hx of dementia. No hx of seizure per son

## 2021-02-21 ENCOUNTER — Observation Stay (HOSPITAL_BASED_OUTPATIENT_CLINIC_OR_DEPARTMENT_OTHER): Payer: Medicare Other

## 2021-02-21 ENCOUNTER — Observation Stay (HOSPITAL_COMMUNITY): Payer: Medicare Other

## 2021-02-21 DIAGNOSIS — N179 Acute kidney failure, unspecified: Secondary | ICD-10-CM | POA: Diagnosis not present

## 2021-02-21 DIAGNOSIS — G459 Transient cerebral ischemic attack, unspecified: Secondary | ICD-10-CM

## 2021-02-21 DIAGNOSIS — R55 Syncope and collapse: Secondary | ICD-10-CM

## 2021-02-21 DIAGNOSIS — R569 Unspecified convulsions: Secondary | ICD-10-CM | POA: Diagnosis not present

## 2021-02-21 LAB — ECHOCARDIOGRAM COMPLETE
AR max vel: 2.4 cm2
AV Area VTI: 2.16 cm2
AV Area mean vel: 2.29 cm2
AV Mean grad: 10 mmHg
AV Peak grad: 14.7 mmHg
Ao pk vel: 1.92 m/s
Area-P 1/2: 4.86 cm2
Calc EF: 61 %
Height: 64 in
P 1/2 time: 500 msec
S' Lateral: 2.8 cm
Single Plane A2C EF: 58.5 %
Single Plane A4C EF: 61.4 %
Weight: 2560 oz

## 2021-02-21 LAB — TSH: TSH: 1.43 u[IU]/mL (ref 0.350–4.500)

## 2021-02-21 LAB — AMMONIA: Ammonia: <10 umol/L (ref 9–35)

## 2021-02-21 LAB — LIPID PANEL
Cholesterol: 204 mg/dL — ABNORMAL HIGH (ref 0–200)
HDL: 42 mg/dL (ref >40)
LDL Cholesterol: 140 mg/dL — ABNORMAL HIGH (ref 0–99)
Total CHOL/HDL Ratio: 4.9 RATIO
Triglycerides: 112 mg/dL (ref <150)
VLDL: 22 mg/dL (ref 0–40)

## 2021-02-21 LAB — HEMOGLOBIN A1C
Hgb A1c MFr Bld: 5.7 % — ABNORMAL HIGH (ref 4.8–5.6)
Mean Plasma Glucose: 116.89 mg/dL

## 2021-02-21 LAB — GLUCOSE, CAPILLARY: Glucose-Capillary: 132 mg/dL — ABNORMAL HIGH (ref 70–99)

## 2021-02-21 LAB — VITAMIN B12: Vitamin B-12: 4743 pg/mL — ABNORMAL HIGH (ref 180–914)

## 2021-02-21 MED ORDER — CLOPIDOGREL BISULFATE 75 MG PO TABS
75.0000 mg | ORAL_TABLET | Freq: Every day | ORAL | Status: DC
  Administered 2021-02-22: 75 mg via ORAL
  Filled 2021-02-21: qty 1

## 2021-02-21 MED ORDER — ACETAMINOPHEN 160 MG/5ML PO SOLN: 650.0000 mg | ORAL | Status: DC | PRN

## 2021-02-21 MED ORDER — ONDANSETRON HCL 4 MG/2ML IJ SOLN
4.0000 mg | Freq: Four times a day (QID) | INTRAMUSCULAR | Status: DC | PRN
  Administered 2021-02-21: 4 mg via INTRAVENOUS
  Filled 2021-02-21: qty 2

## 2021-02-21 MED ORDER — LAMOTRIGINE 150 MG PO TABS
150.0000 mg | ORAL_TABLET | Freq: Two times a day (BID) | ORAL | Status: DC
  Administered 2021-02-21 – 2021-02-22 (×3): 150 mg via ORAL
  Filled 2021-02-21 (×3): qty 2

## 2021-02-21 MED ORDER — DULOXETINE HCL 30 MG PO CPEP
90.0000 mg | ORAL_CAPSULE | Freq: Every day | ORAL | Status: DC
  Administered 2021-02-21 – 2021-02-22 (×3): 90 mg via ORAL
  Filled 2021-02-21 (×3): qty 3

## 2021-02-21 MED ORDER — SODIUM CHLORIDE 0.9 % IV SOLN: INTRAVENOUS | Status: DC

## 2021-02-21 MED ORDER — ACETAMINOPHEN 325 MG PO TABS: 650.0000 mg | ORAL_TABLET | ORAL | Status: DC | PRN

## 2021-02-21 MED ORDER — SENNOSIDES-DOCUSATE SODIUM 8.6-50 MG PO TABS: 1.0000 | ORAL_TABLET | Freq: Every evening | ORAL | Status: DC | PRN

## 2021-02-21 MED ORDER — SODIUM CHLORIDE 0.9 % IV SOLN
1.0000 g | INTRAVENOUS | Status: DC
  Administered 2021-02-21 – 2021-02-22 (×2): 1 g via INTRAVENOUS
  Filled 2021-02-21 (×3): qty 10

## 2021-02-21 MED ORDER — STROKE: EARLY STAGES OF RECOVERY BOOK
Freq: Once | Status: DC
  Filled 2021-02-21: qty 1

## 2021-02-21 MED ORDER — NITROFURANTOIN MACROCRYSTAL 50 MG PO CAPS
100.0000 mg | ORAL_CAPSULE | Freq: Every day | ORAL | Status: DC
  Administered 2021-02-22: 100 mg via ORAL
  Filled 2021-02-21 (×3): qty 2

## 2021-02-21 MED ORDER — QUETIAPINE FUMARATE 25 MG PO TABS
25.0000 mg | ORAL_TABLET | Freq: Every day | ORAL | Status: DC
  Administered 2021-02-21 – 2021-02-22 (×2): 25 mg via ORAL
  Filled 2021-02-21 (×2): qty 1

## 2021-02-21 MED ORDER — ACETAMINOPHEN 650 MG RE SUPP: 650.0000 mg | RECTAL | Status: DC | PRN

## 2021-02-21 MED ORDER — DONEPEZIL HCL 10 MG PO TABS
10.0000 mg | ORAL_TABLET | Freq: Every day | ORAL | Status: DC
  Administered 2021-02-21 – 2021-02-22 (×3): 10 mg via ORAL
  Filled 2021-02-21 (×3): qty 1

## 2021-02-21 MED ORDER — LEVOTHYROXINE SODIUM 100 MCG PO TABS
100.0000 ug | ORAL_TABLET | Freq: Every day | ORAL | Status: DC
  Administered 2021-02-21 – 2021-02-24 (×4): 100 ug via ORAL
  Filled 2021-02-21 (×4): qty 1

## 2021-02-21 MED ORDER — LAMOTRIGINE 100 MG PO TABS
150.0000 mg | ORAL_TABLET | Freq: Every morning | ORAL | Status: DC
  Filled 2021-02-21: qty 1

## 2021-02-21 NOTE — ED Notes (Signed)
[name] [phone] would like an update especially when she gets a room upstairs

## 2021-02-21 NOTE — Progress Notes (Signed)
Carotid duplex has been completed.   Preliminary results in CV Proc.   [name] 02/21/2021 11:10 AM

## 2021-02-21 NOTE — Plan of Care (Signed)
°  Problem: Education: Goal: Knowledge of secondary prevention will improve (SELECT ALL) Outcome: Progressing   

## 2021-02-21 NOTE — Progress Notes (Addendum)
PROGRESS NOTE  [name] XOV:[id number] DOB: 1934-11-11 DOA: 01/26/2021 PCP: Shon Baton, MD   LOS: 0 days   Brief Narrative / Interim history: 86 yo F with HTN, hypothyroidism, hyperlipidemia, prior CVA, mild dementia, comes to the hospital being brought by EMS after having syncopal episode associated with possible seizures at home.  The day prior to admission just finished dinner, sitting down in her chair when she suddenly stopped talking, her eyes rolled back in and started to have seizure-like spell at home.  Per family she was shaking her limbs and gurgling.  She was laying on the ground by son and eventually her movements stopped followed by confusion.  This all lasted a few minutes.  She was brought to the ED, neurology consulted and she was admitted on the hospitalist service  Subjective / 24h Interval events: Doing well this morning.  Tells me that since last night she has had left facial droop.  Currently no chest pain, no abdominal pain, no nausea or vomiting.  Daughter is at bedside today and tells me that she was much weaker than normal and had poor po intake for 2 days prior to this event, needing family to feed her. She also became slightly more confused, being "off" and currently this persists. Sometimes in the past, with UTIs, she would get similar symptoms.   Assessment & Plan: Principal Problem Syncope /possible seizure /concern for CVA /generalized weakness-patient was admitted to the hospital with possible syncope and a seizure episode.  Neurology consulted and following, appreciate input.  CT scan on arrival was negative for acute findings, an MRI could not be obtained due to presence of a spinal stimulator.  There is also concern for CVA given left-sided facial droop.  Telemetry reveals 1st degree AV block which is not new.  LDL is under 140.  A1c 5.7 -2D echo showed EF 60 to 65%, normal LVEF, no WMA.  She has grade 1 diastolic dysfunction -Carotids without  significant stenosis -EEG is pending  -OT recommending SNF, she was too exhausted to work with PT.  She has not walked yet.  Active Problems Essential hypertension-allow permissive hypertension given concern for CVA  Chronic kidney disease stage 3a-most recent creatinine in 2021 ranging between 0.98-1.4, it was around 1.2-1.4 as far back as 2014.  Currently at baseline  Dementia without behavioral disturbance -very mild, chronic   Asymptomatic bacteriuria, possible UTI -Has bacteria in her urine but no urinary symptoms of hesitancy or frequency, but per daughter she never does, gets weak, more confused. Add ceftriaxone and follow cultures   Prolonged QT interval -Avoid medications which could further prolong QT interval    Scheduled Meds:   stroke: mapping our early stages of recovery book   Does not apply Once   clopidogrel  75 mg Oral Q breakfast   donepezil  10 mg Oral QHS   DULoxetine  90 mg Oral QHS   lamoTRIgine  150 mg Oral q morning   levothyroxine  100 mcg Oral Daily   nitrofurantoin  100 mg Oral Daily   Continuous Infusions:  sodium chloride 75 mL/hr at 02/21/21 0252   PRN Meds:.acetaminophen **OR** acetaminophen (TYLENOL) oral liquid 160 mg/5 mL **OR** acetaminophen, ondansetron (ZOFRAN) IV, senna-docusate  Diet Orders (From admission, onward)     Start     Ordered   02/21/21 0137  Diet NPO time specified  Diet effective now        02/21/21 0136  DVT prophylaxis: SCD's Start: 02/21/21 0137     Code Status: DNR  Family Communication: no family at bedside   Status is: Observation  The patient will require care spanning > 2 midnights and should be moved to inpatient because: persistent weakness, needs SNF  Level of care: Telemetry Medical  Consultants:  Neurology   Procedures:  2D echo  Microbiology  none  Antimicrobials: none    Objective: Vitals:   02/21/21 0830 02/21/21 0925 02/21/21 1010 02/21/21 1128  BP: (!) 185/74 (!)  203/89 (!) 197/75 (!) 168/75  Pulse: 68 71 69 77  Resp: 15 14 (!) 23 18  Temp:  98 F (36.7 C)  (!) 97 F (36.1 C)  TempSrc:  Oral  Oral  SpO2: 95% 95% 94% 95%  Weight:      Height:        Intake/Output Summary (Last 24 hours) at 02/21/2021 1343 Last data filed at 02/21/2021 1000 Gross per 24 hour  Intake 475.8 ml  Output --  Net 475.8 ml   Filed Weights   02/19/2021 1604  Weight: 72.6 kg    Examination:  Constitutional: NAD Eyes: no scleral icterus ENMT: Mucous membranes are moist.  Neck: normal, supple Respiratory: clear to auscultation bilaterally, no wheezing, no crackles. Normal respiratory effort. Cardiovascular: Regular rate and rhythm, no murmurs / rubs / gallops. No LE edema.  Abdomen: non distended, no tenderness. Bowel sounds positive.  Musculoskeletal: no clubbing / cyanosis.  Skin: no rashes Neurologic: Left-sided facial droop noted.  Otherwise no significant focal deficits Psychiatric: Normal judgment and insight. Alert and oriented x 3.    Data Reviewed: I have independently reviewed following labs and imaging studies   CBC: Recent Labs  Lab 02/06/2021 1917  WBC 8.3  NEUTROABS 5.5  HGB 13.0  HCT 40.9  MCV 102.3*  PLT 341   Basic Metabolic Panel: Recent Labs  Lab 01/31/2021 1917  NA 135  K 4.2  CL 100  CO2 28  GLUCOSE 198*  BUN 20  CREATININE 1.14*  CALCIUM 9.1   Liver Function Tests: Recent Labs  Lab 01/22/2021 1917  AST 15  ALT 12  ALKPHOS 54  BILITOT 0.5  PROT 7.1  ALBUMIN 3.6   Coagulation Profile: No results for input(s): INR, PROTIME in the last 168 hours. HbA1C: Recent Labs    02/21/21 0500  HGBA1C 5.7*   CBG: No results for input(s): GLUCAP in the last 168 hours.  Recent Results (from the past 240 hour(s))  Resp Panel by RT-PCR (Flu A&B, Covid) Nasopharyngeal Swab     Status: None   Collection Time: 02/16/2021  6:01 PM   Specimen: Nasopharyngeal Swab; Nasopharyngeal(NP) swabs in vial transport medium  Result Value  Ref Range Status   SARS Coronavirus 2 by RT PCR NEGATIVE NEGATIVE Final    Comment: (NOTE) SARS-CoV-2 target nucleic acids are NOT DETECTED.  The SARS-CoV-2 RNA is generally detectable in upper respiratory specimens during the acute phase of infection. The lowest concentration of SARS-CoV-2 viral copies this assay can detect is 138 copies/mL. A negative result does not preclude SARS-Cov-2 infection and should not be used as the sole basis for treatment or other patient management decisions. A negative result may occur with  improper specimen collection/handling, submission of specimen other than nasopharyngeal swab, presence of viral mutation(s) within the areas targeted by this assay, and inadequate number of viral copies(<138 copies/mL). A negative result must be combined with clinical observations, patient history, and epidemiological information. The expected result is  Negative.  Fact Sheet for Patients:  EntrepreneurPulse.com.au  Fact Sheet for Healthcare Providers:  IncredibleEmployment.be  This test is no t yet approved or cleared by the [place] FDA and  has been authorized for detection and/or diagnosis of SARS-CoV-2 by FDA under an Emergency Use Authorization (EUA). This EUA will remain  in effect (meaning this test can be used) for the duration of the COVID-19 declaration under Section 564(b)(1) of the Act, 21 U.S.C.section 360bbb-3(b)(1), unless the authorization is terminated  or revoked sooner.       Influenza A by PCR NEGATIVE NEGATIVE Final   Influenza B by PCR NEGATIVE NEGATIVE Final    Comment: (NOTE) The Xpert Xpress SARS-CoV-2/FLU/RSV plus assay is intended as an aid in the diagnosis of influenza from Nasopharyngeal swab specimens and should not be used as a sole basis for treatment. Nasal washings and aspirates are unacceptable for Xpert Xpress SARS-CoV-2/FLU/RSV testing.  Fact Sheet for  Patients: EntrepreneurPulse.com.au  Fact Sheet for Healthcare Providers: IncredibleEmployment.be  This test is not yet approved or cleared by the [place] FDA and has been authorized for detection and/or diagnosis of SARS-CoV-2 by FDA under an Emergency Use Authorization (EUA). This EUA will remain in effect (meaning this test can be used) for the duration of the COVID-19 declaration under Section 564(b)(1) of the Act, 21 U.S.C. section 360bbb-3(b)(1), unless the authorization is terminated or revoked.  Performed at [facility], [place] [address], [place], [place] [address]      Radiology Studies: CT Head Wo Contrast  Result Date: 02/19/2021 CLINICAL DATA:  Mental status change of unknown cause.  Seizures. EXAM: CT HEAD WITHOUT CONTRAST TECHNIQUE: Contiguous axial images were obtained from the base of the skull through the vertex without intravenous contrast. COMPARISON:  11/29/2014 FINDINGS: Brain: Prominent diffuse cerebral atrophy. Ventricular dilatation consistent with central atrophy. Low-attenuation changes in the deep white matter consistent with small vessel ischemia. Old right cerebellar infarcts. Old lacunar infarcts in the left internal capsule, progressed since prior study. No mass effect or midline shift. No abnormal extra-axial fluid collections. Gray-white matter junctions are distinct. Basal cisterns are not effaced. No acute intracranial hemorrhage. Vascular: Moderate intracranial arterial vascular calcifications. Skull: Calvarium appears intact. Sinuses/Orbits: Paranasal sinuses and mastoid air cells are clear. Other: None. IMPRESSION: No acute intracranial abnormalities. Chronic atrophy and small vessel ischemic changes. Old infarcts as discussed. Electronically Signed   By: Lucienne Capers M.D.   On: 02/03/2021 21:47   DG Chest Port 1 View  Result Date: 01/27/2021 CLINICAL DATA:  Seizure. History of dementia, hypertension  and diabetes. EXAM: PORTABLE CHEST 1 VIEW COMPARISON:  07/07/2019 FINDINGS: Cardiac silhouette is normal in size. No mediastinal or hilar masses. Minor linear scar atelectasis at the left lung base, stable. Lungs otherwise clear. No pleural effusion or pneumothorax. Skeletal structures are grossly intact. IMPRESSION: No active disease. Electronically Signed   By: Lajean Manes M.D.   On: 02/14/2021 18:40   ECHOCARDIOGRAM COMPLETE  Result Date: 02/21/2021    ECHOCARDIOGRAM REPORT   Patient Name:   [name] Date of Exam: 02/21/2021 Medical Rec #:  [id number]      Height:       64.0 in Accession #:    [id number]     Weight:       160.0 lb Date of Birth:  04-23-1934     BSA:          1.779 m Patient Age:    76 years  BP:           168/79 mmHg Patient Gender: F              HR:           70 bpm. Exam Location:  Inpatient Procedure: 2D Echo, Cardiac Doppler and Color Doppler Indications:    TIA  History:        Patient has no prior history of Echocardiogram examinations.                 TIA; Risk Factors:Hypertension and Sleep Apnea.  Sonographer:    Jyl Heinz Referring Phys: [id number] [place]  1. Left ventricular ejection fraction, by estimation, is 60 to 65%. The left ventricle has normal function. The left ventricle has no regional wall motion abnormalities. There is mild left ventricular hypertrophy. Left ventricular diastolic parameters are consistent with Grade I diastolic dysfunction (impaired relaxation).  2. Right ventricular systolic function is normal. The right ventricular size is normal.  3. The mitral valve is degenerative. Trivial mitral valve regurgitation. No evidence of mitral stenosis. Moderate mitral annular calcification.  4. Calcified AV with small gradient but AVA 2.3 cm2. The aortic valve is normal in structure. There is moderate calcification of the aortic valve. There is moderate thickening of the aortic valve. Aortic valve regurgitation is not visualized.  Aortic valve sclerosis/calcification is present, without any evidence of aortic stenosis.  5. The inferior vena cava is normal in size with greater than 50% respiratory variability, suggesting right atrial pressure of 3 mmHg. FINDINGS  Left Ventricle: Left ventricular ejection fraction, by estimation, is 60 to 65%. The left ventricle has normal function. The left ventricle has no regional wall motion abnormalities. The left ventricular internal cavity size was normal in size. There is  mild left ventricular hypertrophy. Left ventricular diastolic parameters are consistent with Grade I diastolic dysfunction (impaired relaxation). Right Ventricle: The right ventricular size is normal. No increase in right ventricular wall thickness. Right ventricular systolic function is normal. Left Atrium: Left atrial size was normal in size. Right Atrium: Right atrial size was normal in size. Pericardium: There is no evidence of pericardial effusion. Mitral Valve: The mitral valve is degenerative in appearance. There is mild thickening of the mitral valve leaflet(s). There is mild calcification of the mitral valve leaflet(s). Moderate mitral annular calcification. Trivial mitral valve regurgitation. No evidence of mitral valve stenosis. Tricuspid Valve: The tricuspid valve is normal in structure. Tricuspid valve regurgitation is not demonstrated. No evidence of tricuspid stenosis. Aortic Valve: Calcified AV with small gradient but AVA 2.3 cm2. The aortic valve is normal in structure. There is moderate calcification of the aortic valve. There is moderate thickening of the aortic valve. Aortic valve regurgitation is not visualized. Aortic regurgitation PHT measures 500 msec. Aortic valve sclerosis/calcification is present, without any evidence of aortic stenosis. Aortic valve mean gradient measures 10.0 mmHg. Aortic valve peak gradient measures 14.7 mmHg. Aortic valve area, by VTI measures 2.16 cm. Pulmonic Valve: The pulmonic valve  was normal in structure. Pulmonic valve regurgitation is not visualized. No evidence of pulmonic stenosis. Aorta: The aortic root is normal in size and structure. Venous: The inferior vena cava is normal in size with greater than 50% respiratory variability, suggesting right atrial pressure of 3 mmHg. IAS/Shunts: No atrial level shunt detected by color flow Doppler.  LEFT VENTRICLE PLAX 2D LVIDd:         4.10 cm     Diastology LVIDs:  2.80 cm     LV e' medial:    6.74 cm/s LV PW:         1.00 cm     LV E/e' medial:  12.7 LV IVS:        1.30 cm     LV e' lateral:   7.40 cm/s LVOT diam:     2.00 cm     LV E/e' lateral: 11.6 LV SV:         101 LV SV Index:   57 LVOT Area:     3.14 cm  LV Volumes (MOD) LV vol d, MOD A2C: 86.9 ml LV vol d, MOD A4C: 91.4 ml LV vol s, MOD A2C: 36.1 ml LV vol s, MOD A4C: 35.3 ml LV SV MOD A2C:     50.8 ml LV SV MOD A4C:     91.4 ml LV SV MOD BP:      56.2 ml RIGHT VENTRICLE            IVC RV Basal diam:  3.60 cm    IVC diam: 1.20 cm RV Mid diam:    3.30 cm RV S prime:     9.90 cm/s TAPSE (M-mode): 1.9 cm LEFT ATRIUM             Index        RIGHT ATRIUM           Index LA diam:        3.20 cm 1.80 cm/m   RA Area:     13.70 cm LA Vol (A2C):   48.3 ml 27.14 ml/m  RA Volume:   32.40 ml  18.21 ml/m LA Vol (A4C):   49.1 ml 27.59 ml/m LA Biplane Vol: 52.2 ml 29.34 ml/m  AORTIC VALVE AV Area (Vmax):    2.40 cm AV Area (Vmean):   2.29 cm AV Area (VTI):     2.16 cm AV Vmax:           191.50 cm/s AV Vmean:          154.000 cm/s AV VTI:            0.466 m AV Peak Grad:      14.7 mmHg AV Mean Grad:      10.0 mmHg LVOT Vmax:         146.00 cm/s LVOT Vmean:        112.500 cm/s LVOT VTI:          0.321 m LVOT/AV VTI ratio: 0.69 AI PHT:            500 msec  AORTA Ao Root diam: 2.90 cm Ao Asc diam:  3.30 cm MITRAL VALVE                TRICUSPID VALVE MV Area (PHT): 4.86 cm     TR Peak grad:   41.0 mmHg MV Decel Time: 156 msec     TR Vmax:        320.00 cm/s MV E velocity: 85.90 cm/s MV A  velocity: 147.00 cm/s  SHUNTS MV E/A ratio:  0.58         Systemic VTI:  0.32 m                             Systemic Diam: 2.00 cm Jenkins Rouge MD Electronically signed by Jenkins Rouge MD Signature Date/Time: 02/21/2021/11:19:06 AM    Final    VAS US CAROTID (  at [facility] and [facility] only)  Result Date: 02/21/2021 Carotid Arterial Duplex Study Patient Name:  [name]  Date of Exam:   02/21/2021 Medical Rec #: [id number]       Accession #:    [id number] Date of Birth: May 15, 1934      Patient Gender: F Patient Age:   64 years Exam Location:  [facility] Procedure:      VAS US CAROTID Referring Phys: Harrold Donath --------------------------------------------------------------------------------  Indications:       TIA. Risk Factors:      Hypertension, hyperlipidemia, Diabetes, prior CVA. Comparison Study:  no prior Performing Technologist: Archie Patten RVS  Examination Guidelines: A complete evaluation includes B-mode imaging, spectral Doppler, color Doppler, and power Doppler as needed of all accessible portions of each vessel. Bilateral testing is considered an integral part of a complete examination. Limited examinations for reoccurring indications may be performed as noted.  Right Carotid Findings: +----------+--------+--------+--------+-------------------------+--------+             PSV cm/s EDV cm/s Stenosis Plaque Description        Comments  +----------+--------+--------+--------+-------------------------+--------+  CCA Prox   92       17                heterogenous                        +----------+--------+--------+--------+-------------------------+--------+  CCA Distal 84       12                heterogenous                        +----------+--------+--------+--------+-------------------------+--------+  ICA Prox   93       23       1-39%    heterogenous and calcific           +----------+--------+--------+--------+-------------------------+--------+  ICA Distal 61       16                                                     +----------+--------+--------+--------+-------------------------+--------+  ECA        83       7                                                     +----------+--------+--------+--------+-------------------------+--------+ +----------+--------+-------+--------+-------------------+             PSV cm/s EDV cms Describe Arm Pressure (mmHG)  +----------+--------+-------+--------+-------------------+  Subclavian 99                                             +----------+--------+-------+--------+-------------------+ +---------+--------+--+--------+--+---------+  Vertebral PSV cm/s 89 EDV cm/s 15 Antegrade  +---------+--------+--+--------+--+---------+  Left Carotid Findings: +----------+--------+--------+--------+---------------------+------------------+             PSV cm/s EDV cm/s Stenosis Plaque Description    Comments            +----------+--------+--------+--------+---------------------+------------------+  CCA Prox   110  13                heterogenous                              +----------+--------+--------+--------+---------------------+------------------+  CCA Distal 73       13                heterogenous                              +----------+--------+--------+--------+---------------------+------------------+  ICA Prox   61       15       1-39%    heterogenous and      shadowing,                                                 calcific              tortuous            +----------+--------+--------+--------+---------------------+------------------+  ICA Distal 75       17                                                          +----------+--------+--------+--------+---------------------+------------------+  ECA        170                                                                  +----------+--------+--------+--------+---------------------+------------------+ +----------+--------+--------+--------+-------------------+             PSV cm/s EDV cm/s Describe Arm  Pressure (mmHG)  +----------+--------+--------+--------+-------------------+  Subclavian 33                                              +----------+--------+--------+--------+-------------------+ +---------+--------+--+--------+----------+  Vertebral PSV cm/s 95 EDV cm/s Retrograde  +---------+--------+--+--------+----------+   Summary: Right Carotid: Velocities in the right ICA are consistent with a 1-39% stenosis. Left Carotid: Velocities in the left ICA are consistent with a 1-39% stenosis. Vertebrals: Right vertebral artery demonstrates antegrade flow. Left vertebral             artery demonstrates retrograde flow. *See table(s) above for measurements and observations.  Electronically signed by Monica Martinez MD on 02/21/2021 at 11:35:28 AM.    Final      Marzetta Board, MD, PhD [facility]  Between 7 am - 7 pm I am available, please contact me via Amion (for emergencies) or Securechat (non urgent messages)  Between 7 pm - 7 am I am not available, please contact night coverage MD/APP via Amion

## 2021-02-21 NOTE — Progress Notes (Signed)
PT Cancellation Note  Patient Details Name: [name] MRN: [id number] DOB: 07-28-1934   Cancelled Treatment:    Reason Eval/Treat Not Completed: Other (comment).  Pt was seen earlier by OT and now is too tired, with mult staff members earlier.  Has not been permitted to eat yet, and asking to wait until  tomorrow to try to walk.  Follow up as time and pt allow.   Ramond Dial 02/21/2021, 1:34 PM  Mee Hives, PT PhD Acute Rehab Dept. Number: [place] [facility] [place]

## 2021-02-21 NOTE — ED Notes (Signed)
Admitting Provider at bedside. 

## 2021-02-21 NOTE — Evaluation (Addendum)
Occupational Therapy Evaluation Patient Details Name: [name] MRN: [id number] DOB: [birth year] Today's Date: 02/21/2021   History of Present Illness 86 y.o. female with medical history significant for HTN, hypothyroidism, HLD, hx of CVA, Dementia presented with syncopal episode associate with convulsions at home.   Clinical Impression   Patient admitted for the diagnosis above.  PTA her son lives with her, and the son provides a lot of assist for ADL/IADL, and probably supervision for mobility at [facility].  Deficits impacting independence are listed below, balance is the primary deficit.  Currently she is needing up to Mod A for basic mobility at RW level, and up to Max A for lower body ADL.  OT to follow in the acute setting, and SNF for short term rehab is recommended prior to returning home.  Patient could discharge home if the son is able to provide 24 hour moderate assist with [facility] therapies.        Recommendations for follow up therapy are one component of a multi-disciplinary discharge planning process, led by the attending physician.  Recommendations may be updated based on patient status, additional functional criteria and insurance authorization.   Follow Up Recommendations  Skilled nursing-short term rehab (<3 hours/day)    Assistance Recommended at Discharge Frequent or constant Supervision/Assistance  Functional Status Assessment  Patient has had a recent decline in their functional status and demonstrates the ability to make significant improvements in function in a reasonable and predictable amount of time.  Equipment Recommendations  BSC/3in1    Recommendations for Other Services       Precautions / Restrictions Precautions Precautions: Fall Restrictions Weight Bearing Restrictions: No      Mobility Bed Mobility Overal bed mobility: Needs Assistance Bed Mobility: Supine to Sit;Sit to Supine     Supine to sit: Mod assist Sit to supine: Mod assist   General  bed mobility comments: assist with trunk and legs Patient Response: Cooperative  Transfers Overall transfer level: Needs assistance Equipment used: Rolling walker (2 wheels) Transfers: Sit to/from Stand Sit to Stand: Min assist           General transfer comment: heavy balance assist      Balance Overall balance assessment: Needs assistance Sitting-balance support: Feet unsupported;Bilateral upper extremity supported Sitting balance-Leahy Scale: Fair   Postural control: Posterior lean Standing balance support: Reliant on assistive device for balance;Bilateral upper extremity supported Standing balance-Leahy Scale: Poor                             ADL either performed or assessed with clinical judgement   ADL Overall ADL's : Needs assistance/impaired Eating/Feeding: Set up;Bed level   Grooming: Wash/dry hands;Wash/dry face;Set up;Bed level   Upper Body Bathing: Minimal assistance;Sitting   Lower Body Bathing: Maximal assistance;Sitting/lateral leans   Upper Body Dressing : Moderate assistance;Sitting   Lower Body Dressing: Maximal assistance;Sitting/lateral leans   Toilet Transfer: Moderate assistance;Stand-pivot;Rolling walker (2 wheels) Toilet Transfer Details (indicate cue type and reason): leaning back in sit and stand.                 Vision Patient Visual Report: No change from baseline       Perception Perception Perception: Not tested   Praxis Praxis Praxis: Not tested    Pertinent Vitals/Pain Pain Assessment: No/denies pain     Hand Dominance Right   Extremity/Trunk Assessment Upper Extremity Assessment Upper Extremity Assessment: Generalized weakness   Lower Extremity Assessment Lower  Extremity Assessment: Defer to PT evaluation   Cervical / Trunk Assessment Cervical / Trunk Assessment: Kyphotic   Communication Communication Communication: No difficulties   Cognition Arousal/Alertness: Awake/alert Behavior During  Therapy: WFL for tasks assessed/performed Overall Cognitive Status: History of cognitive impairments - at baseline                                 General Comments: Declines to memory, complex thought.  Oriented to person, place and situation.  year is 80, and son is 66 yo per patient.     General Comments   Watch BP and O2 sats.    Exercises     Shoulder Instructions      Home Living Family/patient expects to be discharged to:: Private residence Living Arrangements: Children;Other (Comment) (son lives with her) Available Help at Discharge: Family;Available 24 hours/day Type of Home: House Home Access: Stairs to enter [employer] of Steps: 5 Entrance Stairs-Rails: Right;Left;Can reach both Home Layout: One level     Bathroom Shower/Tub: Occupational psychologist: Standard Bathroom Accessibility: Yes How Accessible: Accessible via walker Home Equipment: [place] (2 wheels);Rollator (4 wheels);Shower seat;Cane - single point   Additional Comments: Per patient, son is retired now, and is available as needed.  Patient uses 2WRW at home.      Prior Functioning/Environment Prior Level of Function : Needs assist       Physical Assist : ADLs (physical)   ADLs (physical): Dressing;IADLs Mobility Comments: States she uses a 2WRW at home. ADLs Comments: Son assists with medications, community mobility, lower body dressing, meals and home management.        OT Problem List: Decreased strength;Decreased activity tolerance;Impaired balance (sitting and/or standing)      OT Treatment/Interventions: Self-care/ADL training;Therapeutic exercise;Therapeutic activities;Patient/family education;Balance training    OT Goals(Current goals can be found in the care plan section) Acute Rehab OT Goals Patient Stated Goal: Return home OT Goal Formulation: With patient Time For Goal Achievement: 03/07/21 Potential to Achieve Goals: Good  OT  Frequency: Min 2X/week   Barriers to D/C:    none noted       Co-evaluation              AM-PAC OT "6 Clicks" Daily Activity     Outcome Measure Help from another person eating meals?: None Help from another person taking care of personal grooming?: None Help from another person toileting, which includes using toliet, bedpan, or urinal?: A Lot Help from another person bathing (including washing, rinsing, drying)?: A Lot Help from another person to put on and taking off regular upper body clothing?: A Lot Help from another person to put on and taking off regular lower body clothing?: A Lot 6 Click Score: 16   End of Session Equipment Utilized During Treatment: Gait belt;Rolling walker (2 wheels)  Activity Tolerance: Patient tolerated treatment well Patient left: in bed;with call bell/phone within reach  OT Visit Diagnosis: Unsteadiness on feet (R26.81);Muscle weakness (generalized) (M62.81);Other symptoms and signs involving cognitive function                Time: 4196-2229 OT Time Calculation (min): 27 min Charges:  OT General Charges $OT Visit: 1 Visit OT Evaluation $OT Eval Moderate Complexity: 1 Mod OT Treatments $Self Care/Home Management : 8-22 mins  02/21/2021  RP, OTR/L  Acute Rehabilitation Services  Office:  [phone]   Metta Clines 02/21/2021, 8:52 AM

## 2021-02-21 NOTE — Progress Notes (Signed)
Neurology Progress Note  Subjective: No acute overnight events noted   Exam: Vitals:   02/21/21 1010 02/21/21 1128  BP: (!) 197/75 (!) 168/75  Pulse: 69 77  Resp: (!) 23 18  Temp:  (!) 97 F (36.1 C)  SpO2: 94% 95%   Gen: Sitting in bed with daughter at bedside, in no acute distress Resp: non-labored breathing, no respiratory distress. SpO2 96% on cardiac monitor. Abd: soft, non-tender  Neuro: Mental Status: Awake, alert. Good attention noted.  Speech is fluent without dysarthria. No neglect is noted. Cranial Nerves: PERRL, EMI without ptosis, face appears symmetric resting and with movement, hearing is intact to voice.  Motor: Generalized weakness throughout with 4/5 strength noted. No obvious asymmetry. Patient complains of fatigue 2/2 no PO intake.  Gait: Deferred  Pertinent Labs: CBC    Component Value Date/Time   WBC 8.3 01/25/2021 1917   RBC 4.00 02/17/2021 1917   HGB 13.0 02/14/2021 1917   HCT 40.9 02/04/2021 1917   PLT 250 02/04/2021 1917   MCV 102.3 (H) 02/15/2021 1917   MCH 32.5 02/13/2021 1917   MCHC 31.8 01/26/2021 1917   RDW 12.6 02/16/2021 1917   LYMPHSABS 1.9 01/31/2021 1917   MONOABS 0.7 01/31/2021 1917   EOSABS 0.2 02/07/2021 1917   BASOSABS 0.0 02/14/2021 1917   CMP     Component Value Date/Time   NA 135 01/26/2021 1917   K 4.2 02/15/2021 1917   CL 100 02/01/2021 1917   CO2 28 01/29/2021 1917   GLUCOSE 198 (H) 02/15/2021 1917   BUN 20 02/09/2021 1917   CREATININE 1.14 (H) 02/11/2021 1917   CALCIUM 9.1 02/19/2021 1917   PROT 7.1 01/26/2021 1917   ALBUMIN 3.6 02/14/2021 1917   AST 15 02/06/2021 1917   ALT 12 01/31/2021 1917   ALKPHOS 54 02/11/2021 1917   BILITOT 0.5 02/08/2021 1917   GFRNONAA 47 (L) 02/19/2021 1917   GFRAA >60 07/07/2019 0607   Urinalysis    Component Value Date/Time   COLORURINE YELLOW 02/18/2021 2307   APPEARANCEUR HAZY (A) 02/14/2021 2307   LABSPEC 1.023 02/05/2021 2307   PHURINE 5.0 01/26/2021 2307   GLUCOSEU  NEGATIVE 02/16/2021 2307   HGBUR SMALL (A) 02/04/2021 2307   BILIRUBINUR NEGATIVE 02/12/2021 2307   KETONESUR NEGATIVE 01/26/2021 2307   PROTEINUR NEGATIVE 02/11/2021 2307   UROBILINOGEN 1.0 03/27/2009 1124   NITRITE NEGATIVE 02/05/2021 2307   LEUKOCYTESUR LARGE (A) 02/19/2021 2307   Lab Results  Component Value Date   VITAMINB12 4,743 (H) 02/21/2021   Lab Results  Component Value Date   TSH 1.379 03/31/2012   Ammonia < 10  Imaging Reviewed:  CT head 12/30: No acute intracranial abnormalities. Chronic atrophy and small vessel ischemic changes. Old infarcts as discussed.  Assessment: 86 year old female presenting after a shaking spell at home with semiology suggestive of seizure. Decreased mobility, worsened confusion at home for the past 2 days. Had a prior spell of decreased responsiveness in the past without shaking. No history of seizures.  1. Exam reveals mild naming deficit and mild disorientation. No focal motor weakness noted. No clinical seizure activity noted.  2. Semiology of event at home: Her spell was described by son as eyes rolled back with shaking of limbs and gurgling. No incontinence or tongue-biting per report. She was laid on ground by son and her abnormal movements stopped. Total time of the spell was about 3 minutes. She then awoke to a confused state. When asked, she stated that she  could not recall the spell, but remembered waking up afterwards.  3. Home meds include donepezil, quetiapine, Plavix and Lamictal (150 mg po qam).  4. Confirmed that patient's Lamictal is for mood stabilization with once daily dosing and that the patient is not on any medications for seizures to family's knowledge. 5. CT head:  Prominent diffuse cerebral atrophy. Ventricular dilatation consistent with central atrophy. Low-attenuation changes in the deep white matter consistent with small vessel ischemia. Old right cerebellar infarcts. Old lacunar infarcts in the left internal capsule,  progressed since prior study.   Recommendations: - Unable to obtain MRI brain due to spinal stimulator - EEG pending  - Will increase Lamictal dosing to 150 mg BID  - Continue seizure precautions - Management of asymptomatic bacteruria per primary team  Anibal Henderson, AGACNP-BC [facility] [phone]  The patient was seen and examined with Anibal Henderson, AGAC-NP. The chart and diagnostic studies were reviewed, discussed assessment and agree with plan as plan as outlined in the note above.   Electronically signed by:  Lynnae Sandhoff, MD Page: [phone] 02/21/2021, 5:38 PM

## 2021-02-22 ENCOUNTER — Observation Stay (HOSPITAL_COMMUNITY): Payer: Medicare Other

## 2021-02-22 DIAGNOSIS — N1831 Chronic kidney disease, stage 3a: Secondary | ICD-10-CM | POA: Diagnosis present

## 2021-02-22 DIAGNOSIS — R569 Unspecified convulsions: Secondary | ICD-10-CM | POA: Diagnosis not present

## 2021-02-22 DIAGNOSIS — E039 Hypothyroidism, unspecified: Secondary | ICD-10-CM | POA: Diagnosis present

## 2021-02-22 DIAGNOSIS — I248 Other forms of acute ischemic heart disease: Secondary | ICD-10-CM | POA: Diagnosis not present

## 2021-02-22 DIAGNOSIS — Z66 Do not resuscitate: Secondary | ICD-10-CM | POA: Diagnosis present

## 2021-02-22 DIAGNOSIS — G9349 Other encephalopathy: Secondary | ICD-10-CM | POA: Diagnosis not present

## 2021-02-22 DIAGNOSIS — Z452 Encounter for adjustment and management of vascular access device: Secondary | ICD-10-CM | POA: Diagnosis not present

## 2021-02-22 DIAGNOSIS — R34 Anuria and oliguria: Secondary | ICD-10-CM | POA: Diagnosis present

## 2021-02-22 DIAGNOSIS — R4182 Altered mental status, unspecified: Secondary | ICD-10-CM | POA: Diagnosis not present

## 2021-02-22 DIAGNOSIS — I462 Cardiac arrest due to underlying cardiac condition: Secondary | ICD-10-CM | POA: Diagnosis not present

## 2021-02-22 DIAGNOSIS — I442 Atrioventricular block, complete: Secondary | ICD-10-CM | POA: Diagnosis present

## 2021-02-22 DIAGNOSIS — Z20822 Contact with and (suspected) exposure to covid-19: Secondary | ICD-10-CM | POA: Diagnosis present

## 2021-02-22 DIAGNOSIS — E785 Hyperlipidemia, unspecified: Secondary | ICD-10-CM | POA: Diagnosis present

## 2021-02-22 DIAGNOSIS — I452 Bifascicular block: Secondary | ICD-10-CM | POA: Diagnosis present

## 2021-02-22 DIAGNOSIS — J939 Pneumothorax, unspecified: Secondary | ICD-10-CM | POA: Diagnosis not present

## 2021-02-22 DIAGNOSIS — M353 Polymyalgia rheumatica: Secondary | ICD-10-CM | POA: Diagnosis present

## 2021-02-22 DIAGNOSIS — R55 Syncope and collapse: Secondary | ICD-10-CM | POA: Diagnosis present

## 2021-02-22 DIAGNOSIS — I10 Essential (primary) hypertension: Secondary | ICD-10-CM | POA: Diagnosis not present

## 2021-02-22 DIAGNOSIS — I469 Cardiac arrest, cause unspecified: Secondary | ICD-10-CM | POA: Diagnosis not present

## 2021-02-22 DIAGNOSIS — I459 Conduction disorder, unspecified: Secondary | ICD-10-CM | POA: Diagnosis not present

## 2021-02-22 DIAGNOSIS — N179 Acute kidney failure, unspecified: Secondary | ICD-10-CM | POA: Diagnosis present

## 2021-02-22 DIAGNOSIS — Z8673 Personal history of transient ischemic attack (TIA), and cerebral infarction without residual deficits: Secondary | ICD-10-CM | POA: Diagnosis not present

## 2021-02-22 DIAGNOSIS — F03A0 Unspecified dementia, mild, without behavioral disturbance, psychotic disturbance, mood disturbance, and anxiety: Secondary | ICD-10-CM | POA: Diagnosis present

## 2021-02-22 DIAGNOSIS — E1122 Type 2 diabetes mellitus with diabetic chronic kidney disease: Secondary | ICD-10-CM | POA: Diagnosis present

## 2021-02-22 DIAGNOSIS — Z4682 Encounter for fitting and adjustment of non-vascular catheter: Secondary | ICD-10-CM | POA: Diagnosis not present

## 2021-02-22 DIAGNOSIS — J9601 Acute respiratory failure with hypoxia: Secondary | ICD-10-CM | POA: Diagnosis not present

## 2021-02-22 DIAGNOSIS — G40909 Epilepsy, unspecified, not intractable, without status epilepticus: Secondary | ICD-10-CM | POA: Diagnosis present

## 2021-02-22 DIAGNOSIS — Z515 Encounter for palliative care: Secondary | ICD-10-CM | POA: Diagnosis not present

## 2021-02-22 DIAGNOSIS — I129 Hypertensive chronic kidney disease with stage 1 through stage 4 chronic kidney disease, or unspecified chronic kidney disease: Secondary | ICD-10-CM | POA: Diagnosis present

## 2021-02-22 DIAGNOSIS — B961 Klebsiella pneumoniae [K. pneumoniae] as the cause of diseases classified elsewhere: Secondary | ICD-10-CM | POA: Diagnosis present

## 2021-02-22 DIAGNOSIS — F0390 Unspecified dementia without behavioral disturbance: Secondary | ICD-10-CM | POA: Diagnosis not present

## 2021-02-22 DIAGNOSIS — Z87440 Personal history of urinary (tract) infections: Secondary | ICD-10-CM | POA: Diagnosis not present

## 2021-02-22 DIAGNOSIS — K219 Gastro-esophageal reflux disease without esophagitis: Secondary | ICD-10-CM | POA: Diagnosis present

## 2021-02-22 DIAGNOSIS — N390 Urinary tract infection, site not specified: Secondary | ICD-10-CM | POA: Diagnosis present

## 2021-02-22 LAB — GLUCOSE, CAPILLARY
Glucose-Capillary: 123 mg/dL — ABNORMAL HIGH (ref 70–99)
Glucose-Capillary: 97 mg/dL (ref 70–99)
Glucose-Capillary: 96 mg/dL (ref 70–99)

## 2021-02-22 LAB — COMPREHENSIVE METABOLIC PANEL
ALT: 9 U/L (ref 0–44)
AST: 19 U/L (ref 15–41)
Albumin: 3.3 g/dL — ABNORMAL LOW (ref 3.5–5.0)
Alkaline Phosphatase: 40 U/L (ref 38–126)
Anion gap: 8 (ref 5–15)
BUN: 15 mg/dL (ref 8–23)
CO2: 23 mmol/L (ref 22–32)
Calcium: 8.6 mg/dL — ABNORMAL LOW (ref 8.9–10.3)
Chloride: 104 mmol/L (ref 98–111)
Creatinine, Ser: 0.93 mg/dL (ref 0.44–1.00)
GFR, Estimated: >60 mL/min (ref >60)
Glucose, Bld: 112 mg/dL — ABNORMAL HIGH (ref 70–99)
Potassium: 4.5 mmol/L (ref 3.5–5.1)
Sodium: 135 mmol/L (ref 135–145)
Total Bilirubin: 0.9 mg/dL (ref 0.3–1.2)
Total Protein: 6.3 g/dL — ABNORMAL LOW (ref 6.5–8.1)

## 2021-02-22 LAB — CBC
HCT: 37.3 % (ref 36.0–46.0)
Hemoglobin: 12.1 g/dL (ref 12.0–15.0)
MCH: 32.6 pg (ref 26.0–34.0)
MCHC: 32.4 g/dL (ref 30.0–36.0)
MCV: 100.5 fL — ABNORMAL HIGH (ref 80.0–100.0)
Platelets: 200 10*3/uL (ref 150–400)
RBC: 3.71 MIL/uL — ABNORMAL LOW (ref 3.87–5.11)
RDW: 12.6 % (ref 11.5–15.5)
WBC: 8.3 10*3/uL (ref 4.0–10.5)
nRBC: 0 % (ref 0.0–0.2)

## 2021-02-22 NOTE — Evaluation (Signed)
Speech Language Pathology Evaluation Patient Details Name: [name] MRN: [id number] DOB: 22-Jan-1935 Today's Date: 02/22/2021 Time: 1630-1650 SLP Time Calculation (min) (ACUTE ONLY): 20 min  Problem List:  Patient Active Problem List   Diagnosis Date Noted   Seizure ([place]) 02/22/2021   Syncope 01/25/2021   Convulsions ([place]) 02/12/2021   Generalized weakness 07/06/2019   AKI (acute kidney injury) ([place]) 07/06/2019   Dehydration 07/06/2019   Accidental fall 07/06/2019   Dementia without behavioral disturbance ([place]) 07/06/2019   TIA (transient ischemic attack) 09/08/2012   Cerebral infarction ([place]) 04/04/2012   Cerebellar stroke, acute ([place]) 04/01/2012   Ataxia following cerebral infarction 04/01/2012   Nausea and vomiting 04/01/2012   Polymyalgia rheumatica ([place]) 04/01/2012   Bifascicular block    Hypertension    Hyperlipidemia    Obstructive sleep apnea    Diabetes mellitus ([place])    Past Medical History:  Past Medical History:  Diagnosis Date   Abnormality of gait    Arthritis    Bifascicular bundle branch block    chronic   Degenerative spinal arthritis    cervical and lumbar   Dementia (H[facility])    early onset   Dyspnea on exertion    GERD (gastroesophageal reflux disease)    History of CVA (cerebrovascular accident)    03-31-2012--  right cerebellar infart without hemorrhgia   History of gastric ulcer    2011  w/ bleed   History of kidney stones    History of squamous cell carcinoma excision    Hyperlipidemia    Hypertension    Hypothyroidism    Obstructive sleep apnea    has not been using -- but states has appointment soon to get refitted    Polymyalgia rheumatica ([place])    Stroke ([place])    Type 2 diabetes mellitus ([place])    Urge urinary incontinence    refactory   Past Surgical History:  Past Surgical History:  Procedure Laterality Date   APPENDECTOMY  as child   [place]   INTERSTIM IMPLANT PLACEMENT N/A 11/19/2014   Procedure: INTERSTIM  IMPLANT FIRST STAGE AND ;  Surgeon: Bjorn Loser, MD;  Location: [facility];  Service: Urology;  Laterality: N/A;   INTERSTIM IMPLANT PLACEMENT N/A 11/19/2014   Procedure: [place] IMPLANT SECOND STAGE;  Surgeon: Bjorn Loser, MD;  Location: [facility];  Service: Urology;  Laterality: N/A;   ORIF RIGHT WRIST FX  03-04-2004   SHOULDER ARTHROSCOPY / DEBRIDEMENT/  SAD/  DCR/  MINI OPEN ROTATOR CUFF REPAIR Right 01-09-2003   TEE WITHOUT CARDIOVERSION N/A 09/12/2012   Procedure: TRANSESOPHAGEAL ECHOCARDIOGRAM (TEE);  Surgeon: Lelon Perla, MD;  [facility] ENDOSCOPY;  Service: Cardiovascular;  Laterality: N/A;  proximal septal thickening and chordal SAM creatin a narrow LVOT; turbulence noted   TONSILLECTOMY  as child   TOTAL KNEE ARTHROPLASTY Left 11-12-2004   TRANSTHORACIC ECHOCARDIOGRAM  09-10-2012   mild LVH, mild  focal basal hypertrophy of the septum,  ef 22-29%, grade I diastolic dysfunction/  trival AR /  MV with moderate systolic anterior motion of the chordal structures/  mild LAE   HPI:  87 y.o. female with medical history significant for HTN, hypothyroidism, HLD, hx of CVA, Dementia presented with syncopal episode associate with convulsions at home.   Assessment / Plan / Recommendation Clinical Impression  Patient presents with what appears to be at least a mild decline in speech and cognitive function as compared to her baseline (baseline deficits from prior CVA).  Son [name] was present during the evaluation and provided information on patient's recent history. He reported that her speech intelligibility has declined and she has been more lethargic overall. Patient was slow to respond but able to tell her name, birthdate, aware that she was "in the hospital", stated correct year of "2023" with cues to maintain attention, stated month as "December". She was able to follow basic one-step verbal instructions to complete oral motor exam and was able to  answer appropriately when asked open-ended questions at basic level. Her alertness waxed and waned and at times patient fully alert and other times her eyes would close and she would start snoring. Currently, patient is exhibiting a mild dysarthria impacting his speech intelligiblity and appears with mild decline in her cognition. As patient is to be discharging from hospital to SNF, recommendation is for SNF-level SLP services.    SLP Assessment  SLP Recommendation/Assessment: All further Speech Lanaguage Pathology  needs can be addressed in the next venue of care SLP Visit Diagnosis: Dysarthria and anarthria (R47.1);Cognitive communication deficit (R41.841)    Recommendations for follow up therapy are one component of a multi-disciplinary discharge planning process, led by the attending physician.  Recommendations may be updated based on patient status, additional functional criteria and insurance authorization.    Follow Up Recommendations  Skilled nursing-short term rehab (<3 hours/day)    Assistance Recommended at Discharge  Frequent or constant Supervision/Assistance  Functional Status Assessment Patient has had a recent decline in their functional status and demonstrates the ability to make significant improvements in function in a reasonable and predictable amount of time.  Frequency and Duration     N/A      SLP Evaluation Cognition  Overall Cognitive Status: History of cognitive impairments - at baseline Arousal/Alertness: Awake/alert Orientation Level: Oriented to place;Other (comment) Year: 2023 Month: December Day of Week: Incorrect Attention: Sustained Sustained Attention: Impaired Sustained Attention Impairment: Verbal basic;Functional basic Memory: Impaired Memory Impairment: Decreased recall of new information;Retrieval deficit Awareness: Impaired Awareness Impairment: Intellectual impairment;Emergent impairment Problem Solving: Impaired Problem Solving Impairment:  Functional basic Safety/Judgment: Impaired       Comprehension  Auditory Comprehension Overall Auditory Comprehension: Other (comment) (patient lethargic and had difficulty fully participating. She was able to follow simple commands) Interfering Components: Processing speed;Attention    Expression Expression Primary Mode of Expression: Verbal Verbal Expression Overall Verbal Expression: Impaired Initiation: Impaired Level of Generative/Spontaneous Verbalization: Word;Phrase Interfering Components: Attention;Premorbid deficit Effective Techniques: Open ended questions Non-Verbal Means of Communication: Not applicable   Oral / Motor  Oral Motor/Sensory Function Overall Oral Motor/Sensory Function: Mild impairment Facial ROM: Reduced left Facial Symmetry: Abnormal symmetry left Facial Strength: Reduced left Lingual ROM: Reduced left Lingual Strength: Reduced Motor Speech Overall Motor Speech: Impaired Respiration: Within functional limits Phonation: Normal Resonance: Within functional limits Articulation: Impaired Level of Impairment: Phrase Intelligibility: Intelligibility reduced Word: 75-100% accurate Phrase: 50-74% accurate Sentence: 50-74% accurate Conversation: Not tested Motor Planning: Witnin functional limits Motor Speech Errors: Not applicable Interfering Components: Premorbid status Effective Techniques: Slow rate            Sonia Baller, MA, CCC-SLP Speech Therapy

## 2021-02-22 NOTE — Evaluation (Signed)
Physical Therapy Evaluation Patient Details Name: [name] MRN: [id number] DOB: 02/16/1935 Today's Date: 02/22/2021  History of Present Illness  87 y.o. female with medical history significant for HTN, hypothyroidism, HLD, hx of CVA, Dementia presented with syncopal episode associate with convulsions at home.  Clinical Impression  PTA, pt lives with her family, uses a RW for mobility and requires assist for some ADL's. Pt presents with gross weakness/debility, poor sitting and standing balance, decreased activity tolerance, cognitive impairments. Pt requiring increased assist this date in comparison to OT evaluation 02/21/2021. Pt requiring up to two person maximal assist for stand pivot transfers out of bed and is unable to ambulate. Recommending SNF to address deficits, maximize functional mobility and decrease caregiver burden.Would likely need a hoyer lift if family wishes to take the pt home.     Recommendations for follow up therapy are one component of a multi-disciplinary discharge planning process, led by the attending physician.  Recommendations may be updated based on patient status, additional functional criteria and insurance authorization.  Follow Up Recommendations Skilled nursing-short term rehab (<3 hours/day)    Assistance Recommended at Discharge Frequent or constant Supervision/Assistance  Functional Status Assessment Patient has had a recent decline in their functional status and demonstrates the ability to make significant improvements in function in a reasonable and predictable amount of time.   Equipment Recommendations  Wheelchair (measurements PT);Wheelchair cushion (measurements PT);Hospital bed;Other (comment);BSC/3in1 (hoyer lift)    Recommendations for Other Services       Precautions / Restrictions Precautions Precautions: Fall Restrictions Weight Bearing Restrictions: No      Mobility  Bed Mobility Overal bed mobility: Needs Assistance Bed  Mobility: Supine to Sit;Sit to Supine     Supine to sit: Max assist Sit to supine: Max assist;+2 for physical assistance   General bed mobility comments: Decreased initiation by pt, minimally moving RLE to edge of bed    Transfers Overall transfer level: Needs assistance Equipment used: Rolling walker (2 wheels);None Transfers: Sit to/from Stand;Bed to chair/wheelchair/BSC Sit to Stand: Max assist Stand pivot transfers: Max assist;+2 physical assistance         General transfer comment: Pt requiring heavy maxA to rise to standing with walker initially, with posterior lean. Performed face to face transfer from bed to recliner. NT present to assist with stand pivot back to bed once bed linens changed    Ambulation/Gait               General Gait Details: unable  Stairs            Wheelchair Mobility    Modified Rankin (Stroke Patients Only)       Balance Overall balance assessment: Needs assistance Sitting-balance support: Feet unsupported Sitting balance-Leahy Scale: Poor Sitting balance - Comments: pt with posterior and right lateral lean, requiring minA   Standing balance support: Bilateral upper extremity supported Standing balance-Leahy Scale: Poor                               Pertinent Vitals/Pain Pain Assessment: Faces Faces Pain Scale: No hurt    Home Living Family/patient expects to be discharged to:: Private residence Living Arrangements: Children;Other (Comment) Available Help at Discharge: Family;Available 24 hours/day Type of Home: House Home Access: Stairs to enter Entrance Stairs-Rails: Right;Left;Can reach both Entrance Stairs-Number of Steps: 5   Home Layout: One level Home Equipment: Conservation officer, nature (2 wheels);Rollator (4 wheels);Shower seat;Cane - single point Additional Comments:  Per patient, son is retired now, and is available as needed.  Patient uses 2WRW at home.    Prior Function Prior Level of Function :  Needs assist       Physical Assist : ADLs (physical)   ADLs (physical): Dressing;IADLs Mobility Comments: States she uses a 2WRW at home. ADLs Comments: Son assists with medications, community mobility, lower body dressing, meals and home management.     Hand Dominance   Dominant Hand: Right    Extremity/Trunk Assessment   Upper Extremity Assessment Upper Extremity Assessment: Defer to OT evaluation    Lower Extremity Assessment Lower Extremity Assessment: Generalized weakness    Cervical / Trunk Assessment Cervical / Trunk Assessment: Kyphotic  Communication   Communication: No difficulties  Cognition Arousal/Alertness: Lethargic Behavior During Therapy: Flat affect Overall Cognitive Status: History of cognitive impairments - at baseline                                 General Comments: Poor command following, drowsy        General Comments General comments (skin integrity, edema, etc.): Pre mobility: 171/82, 96% on 2L O2, 75 HR. Post mobility: 181/79    Exercises     Assessment/Plan    PT Assessment Patient needs continued PT services  PT Problem List Decreased strength;Decreased activity tolerance;Decreased balance;Decreased mobility;Decreased cognition;Decreased safety awareness       PT Treatment Interventions DME instruction;Gait training;Therapeutic activities;Functional mobility training;Balance training;Therapeutic exercise;Patient/family education    PT Goals (Current goals can be found in the Care Plan section)  Acute Rehab PT Goals Patient Stated Goal: unable PT Goal Formulation: Patient unable to participate in goal setting Time For Goal Achievement: 03/08/21 Potential to Achieve Goals: Fair    Frequency Min 2X/week   Barriers to discharge        Co-evaluation               AM-PAC PT "6 Clicks" Mobility  Outcome Measure Help needed turning from your back to your side while in a flat bed without using bedrails?: A  Lot Help needed moving from lying on your back to sitting on the side of a flat bed without using bedrails?: A Lot Help needed moving to and from a bed to a chair (including a wheelchair)?: Total Help needed standing up from a chair using your arms (e.g., wheelchair or bedside chair)?: Total Help needed to walk in hospital room?: Total Help needed climbing 3-5 steps with a railing? : Total 6 Click Score: 8    End of Session Equipment Utilized During Treatment: Gait belt Activity Tolerance: Patient limited by lethargy Patient left: in bed;with call bell/phone within reach;with bed alarm set Nurse Communication: Mobility status PT Visit Diagnosis: Unsteadiness on feet (R26.81);Muscle weakness (generalized) (M62.81);Difficulty in walking, not elsewhere classified (R26.2)    Time: 4081-4481 PT Time Calculation (min) (ACUTE ONLY): 35 min   Charges:   PT Evaluation $PT Eval Moderate Complexity: 1 Mod PT Treatments $Therapeutic Activity: 8-22 mins        [name], PT, DPT Acute Rehabilitation Services Pager [phone] Office [phone]   Deno Etienne 02/22/2021, 9:20 AM

## 2021-02-22 NOTE — Plan of Care (Signed)
°  Problem: Education: Goal: Knowledge of disease or condition will improve Outcome: Progressing  Reinforcement needed

## 2021-02-22 NOTE — Progress Notes (Signed)
Patient ID: [name], female   DOB: [birth year], 87 y.o.   MRN: [id number]  PROGRESS NOTE    [name]  [name] DOB: Jul 04, 1934 DOA: 01/27/2021 PCP: Shon Baton, MD   Brief Narrative:  87 yo F with HTN, hypothyroidism, hyperlipidemia, prior CVA, mild dementia presented with syncopal episode associated with possible seizures at home. The day prior to admission, she had just finished dinner and was sitting down in her chair when she suddenly stopped talking, her eyes rolled back in and started to have seizure-like spell at home.  Per family she was shaking her limbs and gurgling.  She was laying on the ground by son and eventually her movements stopped followed by confusion.  This all lasted a few minutes.  She was brought to the ED, neurology consulted and she was admitted on the hospitalist service.   Assessment & Plan:   Possible seizures Syncope: Possibly neurogenic -CT scan on arrival was negative for acute findings, an MRI could not be obtained due to presence of a spinal stimulator.  -LDL 140.  A1c 5.7. --2D echo showed EF 60 to 65%, normal LVEF, no WMA.  She has grade 1 diastolic dysfunction -Carotid ultrasound was without significant stenosis -Neurology following: No EEG negative for seizures.  Currently on lamotrigine as per neurology. -Fall precautions.  Seizure precautions. - PT recommends SNF placement.  Social worker consulted.  Possible UTI: Present on admission -Continue Rocephin.  Urine cultures growing more than 100,000 colonies per mL of Klebsiella pneumoniae.  Follow sensitivities  Essential hypertension -Blood pressure intermittently on the higher side.  Monitor.  Chronic any disease stage IIIa -most recent creatinine in 2021 ranging between 0.98-1.4, it was around 1.2-1.4 as far back as 2014.  Currently at baseline  Dementia -Fall precaution.  Monitor mental status.  Prolonged QT interval -Avoid QT prolonging  medications  Hypothyroidism-continue levothyroxine  DVT prophylaxis: SCDs Code Status: DNR Family Communication: None at bedside Disposition Plan: Status is: Observation  The patient will require care spanning > 2 midnights and should be moved to inpatient because: Of need for SNF placement.  Also requiring IV antibiotics.  Consultants: Neurology  Procedures: Echo  Antimicrobials:  Rocephin from 02/21/2021 onwards  Subjective: Patient seen and examined at bedside.  Sleepy, wakes up slightly, does not participate in conversation much.  Confused.  No overnight fever, vomiting, seizures reported.  Objective: Vitals:   02/22/21 0035 02/22/21 0335 02/22/21 0733 02/22/21 0807  BP: (!) 169/83 (!) 148/74 (!) 160/83 (!) 171/82  Pulse: 87 95 81 77  Resp: 18  (!) 27 (!) 27  Temp: 97.8 F (36.6 C)  97.6 F (36.4 C) 98.7 F (37.1 C)  TempSrc: Axillary  Temporal Axillary  SpO2: 95% 92% 97% 96%  Weight:      Height:        Intake/Output Summary (Last 24 hours) at 02/22/2021 1004 Last data filed at 02/22/2021 0500 Gross per 24 hour  Intake 1482.93 ml  Output --  Net 1482.93 ml   Filed Weights   02/17/2021 1604  Weight: 72.6 kg    Examination:  General exam: Appears calm and comfortable.  Currently on room air.  Looks chronically ill and deconditioned. Respiratory system: Bilateral decreased breath sounds at bases with intermittent tachypnea and some scattered crackles Cardiovascular system: S1 & S2 heard, Rate controlled  Gastrointestinal system: Abdomen is nondistended, soft and nontender. Normal bowel sounds heard. Extremities: No cyanosis, clubbing, edema  Central nervous system: Sleepy, wakes up slightly, does not  participate in conversation much.  Confused.  No focal neurological deficits. Moving extremities Skin: No rashes, lesions or ulcers Psychiatry: Could not be assessed because of mental status.   Data Reviewed: I have personally reviewed following labs and imaging  studies  CBC: Recent Labs  Lab 02/14/2021 1917 02/22/21 0106  WBC 8.3 8.3  NEUTROABS 5.5  --   HGB 13.0 12.1  HCT 40.9 37.3  MCV 102.3* 100.5*  PLT 250 350   Basic Metabolic Panel: Recent Labs  Lab 02/09/2021 1917 02/22/21 0106  NA 135 135  K 4.2 4.5  CL 100 104  CO2 28 23  GLUCOSE 198* 112*  BUN 20 15  CREATININE 1.14* 0.93  CALCIUM 9.1 8.6*   GFR: Estimated Creatinine Clearance: 44 mL/min (by C-G formula based on SCr of 0.93 mg/dL). Liver Function Tests: Recent Labs  Lab 02/14/2021 1917 02/22/21 0106  AST 15 19  ALT 12 9  ALKPHOS 54 40  BILITOT 0.5 0.9  PROT 7.1 6.3*  ALBUMIN 3.6 3.3*   No results for input(s): LIPASE, AMYLASE in the last 168 hours. Recent Labs  Lab 02/21/21 0630  AMMONIA <10   Coagulation Profile: No results for input(s): INR, PROTIME in the last 168 hours. Cardiac Enzymes: No results for input(s): CKTOTAL, CKMB, CKMBINDEX, TROPONINI in the last 168 hours. BNP (last 3 results) No results for input(s): PROBNP in the last 8760 hours. HbA1C: Recent Labs    02/21/21 0500  HGBA1C 5.7*   CBG: Recent Labs  Lab 02/21/21 1946 02/22/21 0810  GLUCAP 132* 123*   Lipid Profile: Recent Labs    02/21/21 0500  CHOL 204*  HDL 42  LDLCALC 140*  TRIG 112  CHOLHDL 4.9   Thyroid Function Tests: Recent Labs    02/21/21 0630  TSH 1.430   Anemia Panel: Recent Labs    02/21/21 0630  VITAMINB12 4,743*   Sepsis Labs: No results for input(s): PROCALCITON, LATICACIDVEN in the last 168 hours.  Recent Results (from the past 240 hour(s))  Resp Panel by RT-PCR (Flu A&B, Covid) Nasopharyngeal Swab     Status: None   Collection Time: 02/03/2021  6:01 PM   Specimen: Nasopharyngeal Swab; Nasopharyngeal(NP) swabs in vial transport medium  Result Value Ref Range Status   SARS Coronavirus 2 by RT PCR NEGATIVE NEGATIVE Final    Comment: (NOTE) SARS-CoV-2 target nucleic acids are NOT DETECTED.  The SARS-CoV-2 RNA is generally detectable in upper  respiratory specimens during the acute phase of infection. The lowest concentration of SARS-CoV-2 viral copies this assay can detect is 138 copies/mL. A negative result does not preclude SARS-Cov-2 infection and should not be used as the sole basis for treatment or other patient management decisions. A negative result may occur with  improper specimen collection/handling, submission of specimen other than nasopharyngeal swab, presence of viral mutation(s) within the areas targeted by this assay, and inadequate number of viral copies(<138 copies/mL). A negative result must be combined with clinical observations, patient history, and epidemiological information. The expected result is Negative.  Fact Sheet for Patients:  EntrepreneurPulse.com.au  Fact Sheet for Healthcare Providers:  IncredibleEmployment.be  This test is no t yet approved or cleared by the [place] FDA and  has been authorized for detection and/or diagnosis of SARS-CoV-2 by FDA under an Emergency Use Authorization (EUA). This EUA will remain  in effect (meaning this test can be used) for the duration of the COVID-19 declaration under Section 564(b)(1) of the Act, 21 U.S.C.section 360bbb-3(b)(1), unless the authorization  is terminated  or revoked sooner.       Influenza A by PCR NEGATIVE NEGATIVE Final   Influenza B by PCR NEGATIVE NEGATIVE Final    Comment: (NOTE) The Xpert Xpress SARS-CoV-2/FLU/RSV plus assay is intended as an aid in the diagnosis of influenza from Nasopharyngeal swab specimens and should not be used as a sole basis for treatment. Nasal washings and aspirates are unacceptable for Xpert Xpress SARS-CoV-2/FLU/RSV testing.  Fact Sheet for Patients: EntrepreneurPulse.com.au  Fact Sheet for Healthcare Providers: IncredibleEmployment.be  This test is not yet approved or cleared by the [place] FDA and has been  authorized for detection and/or diagnosis of SARS-CoV-2 by FDA under an Emergency Use Authorization (EUA). This EUA will remain in effect (meaning this test can be used) for the duration of the COVID-19 declaration under Section 564(b)(1) of the Act, 21 U.S.C. section 360bbb-3(b)(1), unless the authorization is terminated or revoked.  Performed at [facility], [place] [address] [place], [place] [address]   Urine Culture     Status: Abnormal (Preliminary result)   Collection Time: 02/12/2021 11:19 PM   Specimen: Urine, Clean Catch  Result Value Ref Range Status   Specimen Description URINE, CLEAN CATCH  Final   Special Requests NONE  Final   Culture (A)  Final    >=100,000 COLONIES/mL KLEBSIELLA PNEUMONIAE SUSCEPTIBILITIES TO FOLLOW Performed at [facility], [place] [address] [place], [place] [address]    Report Status PENDING  Incomplete         Radiology Studies: CT Head Wo Contrast  Result Date: 01/23/2021 CLINICAL DATA:  Mental status change of unknown cause.  Seizures. EXAM: CT HEAD WITHOUT CONTRAST TECHNIQUE: Contiguous axial images were obtained from the base of the skull through the vertex without intravenous contrast. COMPARISON:  11/29/2014 FINDINGS: Brain: Prominent diffuse cerebral atrophy. Ventricular dilatation consistent with central atrophy. Low-attenuation changes in the deep white matter consistent with small vessel ischemia. Old right cerebellar infarcts. Old lacunar infarcts in the left internal capsule, progressed since prior study. No mass effect or midline shift. No abnormal extra-axial fluid collections. Gray-white matter junctions are distinct. Basal cisterns are not effaced. No acute intracranial hemorrhage. Vascular: Moderate intracranial arterial vascular calcifications. Skull: Calvarium appears intact. Sinuses/Orbits: Paranasal sinuses and mastoid air cells are clear. Other: None. IMPRESSION: No acute intracranial abnormalities. Chronic atrophy and  small vessel ischemic changes. Old infarcts as discussed. Electronically Signed   By: Lucienne Capers M.D.   On: 02/15/2021 21:47   DG Chest Port 1 View  Result Date: 02/05/2021 CLINICAL DATA:  Seizure. History of dementia, hypertension and diabetes. EXAM: PORTABLE CHEST 1 VIEW COMPARISON:  07/07/2019 FINDINGS: Cardiac silhouette is normal in size. No mediastinal or hilar masses. Minor linear scar atelectasis at the left lung base, stable. Lungs otherwise clear. No pleural effusion or pneumothorax. Skeletal structures are grossly intact. IMPRESSION: No active disease. Electronically Signed   By: Lajean Manes M.D.   On: 01/31/2021 18:40   EEG adult  Result Date: 02/22/2021 Lora Havens, MD     02/22/2021  7:55 AM Patient Name: [name] MRN: [id number] Epilepsy Attending: Lora Havens Referring Physician/Provider: Dr Kerney Elbe Date: 02/22/2021 Duration: 21.35 mins Patient history:  87 year old female presenting after a shaking spell at home with semiology suggestive of seizure. EEG to evaluate for seizure. Level of alertness: Awake AEDs during EEG study: LTG Technical aspects: This EEG study was done with scalp electrodes positioned according to the 10-20 International  system of electrode placement. Electrical activity was acquired at a sampling rate of 500Hz  and reviewed with a high frequency filter of 70Hz  and a low frequency filter of 1Hz . EEG data were recorded continuously and digitally stored. Description: The posterior dominant rhythm consists of 8 Hz activity of moderate voltage (25-35 uV) seen predominantly in posterior head regions, symmetric and reactive to eye opening and eye closing. EEG showed intermittent generalized 3 to 6 Hz theta-delta slowing. Hyperventilation and photic stimulation were not performed.   ABNORMALITY - Intermittent slow, generalized IMPRESSION: This study is suggestive of mild diffuse encephalopathy, nonspecific etiology. No seizures or epileptiform discharges  were seen throughout the recording. Lora Havens   ECHOCARDIOGRAM COMPLETE  Result Date: 02/21/2021    ECHOCARDIOGRAM REPORT   Patient Name:   [name] [place] Date of Exam: 02/21/2021 Medical Rec #:  [id number]      Height:       64.0 in Accession #:    [id number]     Weight:       160.0 lb Date of Birth:  Feb 03, 1935     BSA:          1.779 m Patient Age:    32 years       BP:           168/79 mmHg Patient Gender: F              HR:           70 bpm. Exam Location:  Inpatient Procedure: 2D Echo, Cardiac Doppler and Color Doppler Indications:    TIA  History:        Patient has no prior history of Echocardiogram examinations.                 TIA; Risk Factors:Hypertension and Sleep Apnea.  Sonographer:    Jyl Heinz Referring Phys: [id number] [place]  1. Left ventricular ejection fraction, by estimation, is 60 to 65%. The left ventricle has normal function. The left ventricle has no regional wall motion abnormalities. There is mild left ventricular hypertrophy. Left ventricular diastolic parameters are consistent with Grade I diastolic dysfunction (impaired relaxation).  2. Right ventricular systolic function is normal. The right ventricular size is normal.  3. The mitral valve is degenerative. Trivial mitral valve regurgitation. No evidence of mitral stenosis. Moderate mitral annular calcification.  4. Calcified AV with small gradient but AVA 2.3 cm2. The aortic valve is normal in structure. There is moderate calcification of the aortic valve. There is moderate thickening of the aortic valve. Aortic valve regurgitation is not visualized. Aortic valve sclerosis/calcification is present, without any evidence of aortic stenosis.  5. The inferior vena cava is normal in size with greater than 50% respiratory variability, suggesting right atrial pressure of 3 mmHg. FINDINGS  Left Ventricle: Left ventricular ejection fraction, by estimation, is 60 to 65%. The left ventricle has normal  function. The left ventricle has no regional wall motion abnormalities. The left ventricular internal cavity size was normal in size. There is  mild left ventricular hypertrophy. Left ventricular diastolic parameters are consistent with Grade I diastolic dysfunction (impaired relaxation). Right Ventricle: The right ventricular size is normal. No increase in right ventricular wall thickness. Right ventricular systolic function is normal. Left Atrium: Left atrial size was normal in size. Right Atrium: Right atrial size was normal in size. Pericardium: There is no evidence of pericardial effusion. Mitral Valve: The mitral valve is degenerative in appearance. There is mild  thickening of the mitral valve leaflet(s). There is mild calcification of the mitral valve leaflet(s). Moderate mitral annular calcification. Trivial mitral valve regurgitation. No evidence of mitral valve stenosis. Tricuspid Valve: The tricuspid valve is normal in structure. Tricuspid valve regurgitation is not demonstrated. No evidence of tricuspid stenosis. Aortic Valve: Calcified AV with small gradient but AVA 2.3 cm2. The aortic valve is normal in structure. There is moderate calcification of the aortic valve. There is moderate thickening of the aortic valve. Aortic valve regurgitation is not visualized. Aortic regurgitation PHT measures 500 msec. Aortic valve sclerosis/calcification is present, without any evidence of aortic stenosis. Aortic valve mean gradient measures 10.0 mmHg. Aortic valve peak gradient measures 14.7 mmHg. Aortic valve area, by VTI measures 2.16 cm. Pulmonic Valve: The pulmonic valve was normal in structure. Pulmonic valve regurgitation is not visualized. No evidence of pulmonic stenosis. Aorta: The aortic root is normal in size and structure. Venous: The inferior vena cava is normal in size with greater than 50% respiratory variability, suggesting right atrial pressure of 3 mmHg. IAS/Shunts: No atrial level shunt detected  by color flow Doppler.  LEFT VENTRICLE PLAX 2D LVIDd:         4.10 cm     Diastology LVIDs:         2.80 cm     LV e' medial:    6.74 cm/s LV PW:         1.00 cm     LV E/e' medial:  12.7 LV IVS:        1.30 cm     LV e' lateral:   7.40 cm/s LVOT diam:     2.00 cm     LV E/e' lateral: 11.6 LV SV:         101 LV SV Index:   57 LVOT Area:     3.14 cm  LV Volumes (MOD) LV vol d, MOD A2C: 86.9 ml LV vol d, MOD A4C: 91.4 ml LV vol s, MOD A2C: 36.1 ml LV vol s, MOD A4C: 35.3 ml LV SV MOD A2C:     50.8 ml LV SV MOD A4C:     91.4 ml LV SV MOD BP:      56.2 ml RIGHT VENTRICLE            IVC RV Basal diam:  3.60 cm    IVC diam: 1.20 cm RV Mid diam:    3.30 cm RV S prime:     9.90 cm/s TAPSE (M-mode): 1.9 cm LEFT ATRIUM             Index        RIGHT ATRIUM           Index LA diam:        3.20 cm 1.80 cm/m   RA Area:     13.70 cm LA Vol (A2C):   48.3 ml 27.14 ml/m  RA Volume:   32.40 ml  18.21 ml/m LA Vol (A4C):   49.1 ml 27.59 ml/m LA Biplane Vol: 52.2 ml 29.34 ml/m  AORTIC VALVE AV Area (Vmax):    2.40 cm AV Area (Vmean):   2.29 cm AV Area (VTI):     2.16 cm AV Vmax:           191.50 cm/s AV Vmean:          154.000 cm/s AV VTI:            0.466 m AV Peak Grad:  14.7 mmHg AV Mean Grad:      10.0 mmHg LVOT Vmax:         146.00 cm/s LVOT Vmean:        112.500 cm/s LVOT VTI:          0.321 m LVOT/AV VTI ratio: 0.69 AI PHT:            500 msec  AORTA Ao Root diam: 2.90 cm Ao Asc diam:  3.30 cm MITRAL VALVE                TRICUSPID VALVE MV Area (PHT): 4.86 cm     TR Peak grad:   41.0 mmHg MV Decel Time: 156 msec     TR Vmax:        320.00 cm/s MV E velocity: 85.90 cm/s MV A velocity: 147.00 cm/s  SHUNTS MV E/A ratio:  0.58         Systemic VTI:  0.32 m                             Systemic Diam: 2.00 cm Jenkins Rouge MD Electronically signed by Jenkins Rouge MD Signature Date/Time: 02/21/2021/11:19:06 AM    Final    VAS US CAROTID (at [facility] and [facility] only)  Result Date: 02/21/2021 Carotid Arterial Duplex Study Patient  Name:  [name] Gavina  Date of Exam:   02/21/2021 Medical Rec #: [id number]       Accession #:    [id number] Date of Birth: 11/30/1934      Patient Gender: F Patient Age:   57 years Exam Location:  [facility] Procedure:      VAS US CAROTID Referring Phys: Harrold Donath --------------------------------------------------------------------------------  Indications:       TIA. Risk Factors:      Hypertension, hyperlipidemia, Diabetes, prior CVA. Comparison Study:  no prior Performing Technologist: Archie Patten RVS  Examination Guidelines: A complete evaluation includes B-mode imaging, spectral Doppler, color Doppler, and power Doppler as needed of all accessible portions of each vessel. Bilateral testing is considered an integral part of a complete examination. Limited examinations for reoccurring indications may be performed as noted.  Right Carotid Findings: +----------+--------+--------+--------+-------------------------+--------+             PSV cm/s EDV cm/s Stenosis Plaque Description        Comments  +----------+--------+--------+--------+-------------------------+--------+  CCA Prox   92       17                heterogenous                        +----------+--------+--------+--------+-------------------------+--------+  CCA Distal 84       12                heterogenous                        +----------+--------+--------+--------+-------------------------+--------+  ICA Prox   93       23       1-39%    heterogenous and calcific           +----------+--------+--------+--------+-------------------------+--------+  ICA Distal 61       16                                                    +----------+--------+--------+--------+-------------------------+--------+  ECA        83       7                                                     +----------+--------+--------+--------+-------------------------+--------+ +----------+--------+-------+--------+-------------------+             PSV cm/s EDV  cms Describe Arm Pressure (mmHG)  +----------+--------+-------+--------+-------------------+  Subclavian 99                                             +----------+--------+-------+--------+-------------------+ +---------+--------+--+--------+--+---------+  Vertebral PSV cm/s 89 EDV cm/s 15 Antegrade  +---------+--------+--+--------+--+---------+  Left Carotid Findings: +----------+--------+--------+--------+---------------------+------------------+             PSV cm/s EDV cm/s Stenosis Plaque Description    Comments            +----------+--------+--------+--------+---------------------+------------------+  CCA Prox   110      13                heterogenous                              +----------+--------+--------+--------+---------------------+------------------+  CCA Distal 73       13                heterogenous                              +----------+--------+--------+--------+---------------------+------------------+  ICA Prox   61       15       1-39%    heterogenous and      shadowing,                                                 calcific              tortuous            +----------+--------+--------+--------+---------------------+------------------+  ICA Distal 75       17                                                          +----------+--------+--------+--------+---------------------+------------------+  ECA        170                                                                  +----------+--------+--------+--------+---------------------+------------------+ +----------+--------+--------+--------+-------------------+             PSV cm/s EDV cm/s Describe Arm Pressure (mmHG)  +----------+--------+--------+--------+-------------------+  Subclavian 33                                              +----------+--------+--------+--------+-------------------+ +---------+--------+--+--------+----------+  Vertebral PSV cm/s 95 EDV cm/s Retrograde  +---------+--------+--+--------+----------+   Summary:  Right Carotid: Velocities in the right ICA are consistent with a 1-39% stenosis. Left Carotid: Velocities in the left ICA are consistent with a 1-39% stenosis. Vertebrals: Right vertebral artery demonstrates antegrade flow. Left vertebral             artery demonstrates retrograde flow. *See table(s) above for measurements and observations.  Electronically signed by Monica Martinez MD on 02/21/2021 at 11:35:28 AM.    Final         Scheduled Meds:   stroke: mapping our early stages of recovery book   Does not apply Once   clopidogrel  75 mg Oral Q breakfast   donepezil  10 mg Oral QHS   DULoxetine  90 mg Oral QHS   lamoTRIgine  150 mg Oral BID   levothyroxine  100 mcg Oral Daily   nitrofurantoin  100 mg Oral Daily   QUEtiapine  25 mg Oral QHS   Continuous Infusions:  sodium chloride 75 mL/hr at 02/22/21 0621   cefTRIAXone (ROCEPHIN)  IV 1 g (02/21/21 1435)          Aline August, MD [facility] 02/22/2021, 10:04 AM

## 2021-02-22 NOTE — Plan of Care (Addendum)
Neurology Plan of Care  Routine EEG 02/22/2021: "This study is suggestive of mild diffuse encephalopathy, nonspecific etiology. No seizures or epileptiform discharges were seen throughout the recording."  Urinalysis    Component Value Date/Time   COLORURINE YELLOW 02/10/2021 2307   APPEARANCEUR HAZY (A) 01/29/2021 2307   LABSPEC 1.023 01/27/2021 2307   PHURINE 5.0 01/22/2021 2307   GLUCOSEU NEGATIVE 01/23/2021 2307   HGBUR SMALL (A) 02/19/2021 2307   BILIRUBINUR NEGATIVE 02/04/2021 2307   KETONESUR NEGATIVE 01/29/2021 2307   PROTEINUR NEGATIVE 02/15/2021 2307   UROBILINOGEN 1.0 03/27/2009 1124   NITRITE NEGATIVE 02/08/2021 2307   LEUKOCYTESUR LARGE (A) 02/11/2021 2307   Specimen Description URINE, CLEAN CATCH   Special Requests NONE  Performed at [facility], [place] [address], [place], [place] [address]   Culture >=100,000 COLONIES/mL GRAM NEGATIVE RODS Abnormal     Assessment/Plan: Patient's presentation is suspicious for a seizure in the setting of infection with an acute UTI with > 100,000 colonies/mL gram negative rods. Unable to obtain MRI brain due to spinal stimulator. Recommend treatment of UTI per primary team. Will continue lamotrigine at increased dose of 150 mg BID due to patient with questionable history of seizure (questionable seizure at onset of previous stroke > 12 years ago) and with frequent UTIs. Patient will need to follow up with outpatient neurology in about 4 weeks. No further inpatient neurology recommendations at this time, please contact neurology with further questions or concerns.  Plan discussed with attending neurologist, Dr. Theda Sers, who is in agreement.   Anibal Henderson, AGACNP-BC [facility] [phone]

## 2021-02-22 NOTE — TOC Initial Note (Signed)
Transition of Care [facility]) - Initial/Assessment Note    Patient Details  Name: [name] MRN: [id number] Date of Birth: [birth year]  Transition [facility] Care [facility]) CM/SW Contact:    Joanne Chars, LCSW Phone Number: 02/22/2021, 12:01 PM  Clinical Narrative:    Pt oriented x1, unable to participate in assessment.  CSW spoke with daughter Sonia Baller in room and son [name] participated by speakerphone.  Discussed recommendation for SNF, they are in agreement, choice document given.  Permission given to send out referral in hub.  Pt lives with son [name], [facility] services recently ended and [facility] has been following with discussions for a move to [facility] hospice services.  They would like pt to return home but have been having more trouble providing care as her strength has waned.  Pt is vaccinated for covid with one booster.  Referral sent out in hub for SNF               Expected Discharge Plan: [place] Barriers to Discharge: Continued Medical Work up, SNF Pending bed offer   Patient Goals and CMS Choice   CMS Medicare.gov Compare Post Acute Care list provided to:: Patient Represenative (must comment) Choice offered to / list presented to : Adult Children (daughter Sonia Baller)  Expected Discharge Plan and Services Expected Discharge Plan: [place] Choice: [place] arrangements for the past 2 months: Single Family Home                                      Prior Living Arrangements/Services Living arrangements for the past 2 months: Single Family Home Lives with:: Adult Children (lives with son [name]) Patient language and need for interpreter reviewed:: No        Need for Family Participation in Patient Care: Yes (Comment) Care giver support system in place?: Yes (comment) Current home services: Other (comment) (None, but HH was involved recently) Criminal Activity/Legal Involvement Pertinent  to Current Situation/Hospitalization: No - Comment as needed  Activities of Daily Living Home Assistive Devices/Equipment: Wheelchair, Environmental consultant (specify type) ADL Screening (condition at time of admission) Patient's cognitive ability adequate to safely complete daily activities?: No Is the patient deaf or have difficulty hearing?: No Does the patient have difficulty seeing, even when wearing glasses/contacts?: No Does the patient have difficulty concentrating, remembering, or making decisions?: Yes Patient able to express need for assistance with ADLs?: Yes Does the patient have difficulty dressing or bathing?: Yes Independently performs ADLs?: No Communication: Independent Dressing (OT): Needs assistance Is this a change from baseline?: Pre-admission baseline Grooming: Needs assistance Is this a change from baseline?: Pre-admission baseline Feeding: Independent Bathing: Needs assistance Is this a change from baseline?: Pre-admission baseline Toileting: Needs assistance Is this a change from baseline?: Pre-admission baseline In/Out Bed: Needs assistance Is this a change from baseline?: Pre-admission baseline Walks in Home: Needs assistance Is this a change from baseline?: Pre-admission baseline Does the patient have difficulty walking or climbing stairs?: Yes Weakness of Legs: None Weakness of Arms/Hands: Both  Permission Sought/Granted                  Emotional Assessment Appearance:: Appears stated age Attitude/Demeanor/Rapport: Unable to Assess Affect (typically observed): Unable to Assess Orientation: : Oriented to Self Alcohol / Substance Use: Not Applicable Psych Involvement: No (comment)  Admission diagnosis:  Syncope [R55] Seizure-like activity (  [place]) [R56.9] Patient Active Problem List   Diagnosis Date Noted   Syncope 02/19/2021   Convulsions ([place]) 01/23/2021   Generalized weakness 07/06/2019   AKI (acute kidney injury) ([place]) 07/06/2019   Dehydration  07/06/2019   Accidental fall 07/06/2019   Dementia without behavioral disturbance ([place]) 07/06/2019   TIA (transient ischemic attack) 09/08/2012   Cerebral infarction ([place]) 04/04/2012   Cerebellar stroke, acute ([place]) 04/01/2012   Ataxia following cerebral infarction 04/01/2012   Nausea and vomiting 04/01/2012   Polymyalgia rheumatica ([place]) 04/01/2012   Bifascicular block    Hypertension    Hyperlipidemia    Obstructive sleep apnea    Diabetes mellitus ([place])    PCP:  Shon Baton, MD Pharmacy:   [facility] [place], [place] - [address] Kristeen Mans [address] Kristeen Mans [place] [address] Phone: [phone] Fax: [phone]     Social Determinants of Health ([place]) Interventions    Readmission Risk Interventions No flowsheet data found.

## 2021-02-22 NOTE — Procedures (Addendum)
Patient Name: [name]  MRN: [id number]  Epilepsy Attending: Lora Havens  Referring Physician/Provider: Dr Kerney Elbe Date: 02/22/2021 Duration: 21.35 mins  Patient history:  87 year old female presenting after a shaking spell at home with semiology suggestive of seizure. EEG to evaluate for seizure.   Level of alertness: Awake  AEDs during EEG study: LTG  Technical aspects: This EEG study was done with scalp electrodes positioned according to the 10-20 International system of electrode placement. Electrical activity was acquired at a sampling rate of 500Hz  and reviewed with a high frequency filter of 70Hz  and a low frequency filter of 1Hz . EEG data were recorded continuously and digitally stored.   Description: The posterior dominant rhythm consists of 8 Hz activity of moderate voltage (25-35 uV) seen predominantly in posterior head regions, symmetric and reactive to eye opening and eye closing. EEG showed intermittent generalized 3 to 6 Hz theta-delta slowing. Hyperventilation and photic stimulation were not performed.     ABNORMALITY - Intermittent slow, generalized  IMPRESSION: This study is suggestive of mild diffuse encephalopathy, nonspecific etiology. No seizures or epileptiform discharges were seen throughout the recording.  Anthem Frazer Barbra Sarks

## 2021-02-22 NOTE — Progress Notes (Signed)
Rn and patient family at bedside. Patient appears lethargic, but calm. Patient does not complain of pain at this time. Patient has been noted to appear lethargic throughout shift; difficulty obtaining complete NIH assessment. Patient family also reported more lethargic in patient compared to days prior. Report given to oncoming RN. Will continue to monitor

## 2021-02-22 NOTE — NC FL2 (Signed)
[place] MEDICAID FL2 LEVEL OF CARE SCREENING TOOL     IDENTIFICATION  Patient Name: [name] Birthdate: 10-13-1934 Sex: female Admission Date (Current Location): 01/27/2021  [facility] and [place] Number:  Herbalist and Address:  [facility] [place], [place] [address], [place], [place] [address]      Provider Number: [id number]  Attending Physician Name and Address:  Aline August, MD  Relative Name and Phone Number:  Jiselle, Sheu [phone]    Current Level of Care: Hospital Recommended Level of Care: [place] Prior Approval Number:    Date Approved/Denied:   PASRR Number: [id number]  Discharge Plan: SNF    Current Diagnoses: Patient Active Problem List   Diagnosis Date Noted   Syncope 02/04/2021   Convulsions ([place]) 02/06/2021   Generalized weakness 07/06/2019   AKI (acute kidney injury) ([place]) 07/06/2019   Dehydration 07/06/2019   Accidental fall 07/06/2019   Dementia without behavioral disturbance ([place]) 07/06/2019   TIA (transient ischemic attack) 09/08/2012   Cerebral infarction ([place]) 04/04/2012   Cerebellar stroke, acute ([place]) 04/01/2012   Ataxia following cerebral infarction 04/01/2012   Nausea and vomiting 04/01/2012   Polymyalgia rheumatica ([place]) 04/01/2012   Bifascicular block    Hypertension    Hyperlipidemia    Obstructive sleep apnea    Diabetes mellitus ([place])     Orientation RESPIRATION BLADDER Height & Weight     Self  O2 Incontinent, External catheter Weight: 160 lb (72.6 kg) Height:  5\' 4"  (162.6 cm)  BEHAVIORAL SYMPTOMS/MOOD NEUROLOGICAL BOWEL NUTRITION STATUS      Continent Diet (see discharge summary)  AMBULATORY STATUS COMMUNICATION OF NEEDS Skin   Extensive Assist Verbally Skin abrasions                       Personal Care Assistance Level of Assistance  Bathing, Feeding, Dressing Bathing Assistance: Maximum assistance Feeding assistance: Limited assistance Dressing  Assistance: Maximum assistance     Functional Limitations Info  Sight, Hearing, Speech Sight Info: Adequate Hearing Info: Adequate Speech Info: Adequate    SPECIAL CARE FACTORS FREQUENCY  PT (By licensed PT), OT (By licensed OT)     PT Frequency: 5x week OT Frequency: 5x week            Contractures Contractures Info: Not present    Additional Factors Info  Code Status, Allergies Code Status Info: DNR Allergies Info: Adhesive (Tape), Levofloxacin, Omeprazole           Current Medications (02/22/2021):  This is the current hospital active medication list Current Facility-Administered Medications  Medication Dose Route Frequency Provider Last Rate Last Admin    stroke: mapping our early stages of recovery book   Does not apply Once Chotiner, Yevonne Aline, MD       0.9 %  sodium chloride infusion   Intravenous Continuous Aline August, MD 75 mL/hr at 02/22/21 0621 New Bag at 02/22/21 8768   acetaminophen (TYLENOL) tablet 650 mg  650 mg Oral Q4H PRN Chotiner, Yevonne Aline, MD       Or   acetaminophen (TYLENOL) 160 MG/5ML solution 650 mg  650 mg Per Tube Q4H PRN Chotiner, Yevonne Aline, MD       Or   acetaminophen (TYLENOL) suppository 650 mg  650 mg Rectal Q4H PRN Chotiner, Yevonne Aline, MD       cefTRIAXone (ROCEPHIN) 1 g in sodium chloride 0.9 % 100 mL IVPB  1 g Intravenous Q24H Gherghe, Costin  M, MD 200 mL/hr at 02/21/21 1435 1 g at 02/21/21 1435   clopidogrel (PLAVIX) tablet 75 mg  75 mg Oral Q breakfast Chotiner, Yevonne Aline, MD   75 mg at 02/22/21 1020   donepezil (ARICEPT) tablet 10 mg  10 mg Oral QHS Chotiner, Yevonne Aline, MD   10 mg at 02/21/21 2215   DULoxetine (CYMBALTA) DR capsule 90 mg  90 mg Oral QHS Chotiner, Yevonne Aline, MD   90 mg at 02/21/21 2214   lamoTRIgine (LAMICTAL) tablet 150 mg  150 mg Oral BID Rikki Spearing, NP   150 mg at 02/22/21 1021   levothyroxine (SYNTHROID) tablet 100 mcg  100 mcg Oral Daily Chotiner, Yevonne Aline, MD   100 mcg at 02/22/21 8768    nitrofurantoin (MACRODANTIN) capsule 100 mg  100 mg Oral Daily Chotiner, Yevonne Aline, MD   100 mg at 02/22/21 1027   ondansetron (ZOFRAN) injection 4 mg  4 mg Intravenous Q6H PRN Caren Griffins, MD   4 mg at 02/21/21 1010   QUEtiapine (SEROQUEL) tablet 25 mg  25 mg Oral QHS Caren Griffins, MD   25 mg at 02/21/21 2215   senna-docusate (Senokot-S) tablet 1 tablet  1 tablet Oral QHS PRN Chotiner, Yevonne Aline, MD         Discharge Medications: Please see discharge summary for a list of discharge medications.  Relevant Imaging Results:  Relevant Lab Results:   Additional Information SSN: [id number].  Pt is vaccinated for covid with one booster.  Joanne Chars, LCSW

## 2021-02-22 NOTE — Progress Notes (Signed)
Patient asleep during rounds. Respirations even and unlabored. Bed in the lowest position with bed alarm active.

## 2021-02-22 NOTE — Progress Notes (Signed)
°   02/22/21 0807  Assess: MEWS Score  Temp 98.7 F (37.1 C)  BP (!) 171/82  Pulse Rate 77  ECG Heart Rate 77  Resp (!) 27  Level of Consciousness Alert  SpO2 96 %  O2 Device Nasal Cannula  O2 Flow Rate (L/min) 3 L/min  Assess: MEWS Score  MEWS Temp 0  MEWS Systolic 0  MEWS Pulse 0  MEWS RR 2  MEWS LOC 0  MEWS Score 2  MEWS Score Color Yellow  Assess: if the MEWS score is Yellow or Red  Were vital signs taken at a resting state? Yes  Focused Assessment No change from prior assessment  Early Detection of Sepsis Score *See Row Information* Low  MEWS guidelines implemented *See Row Information* Yes  Treat  MEWS Interventions Escalated (See documentation below)  Pain Scale 0-10  Pain Score 0  Take Vital Signs  Increase Vital Sign Frequency  Yellow: Q 2hr X 2 then Q 4hr X 2, if remains yellow, continue Q 4hrs  Escalate  MEWS: Escalate Yellow: discuss with charge nurse/RN and consider discussing with provider and RRT  Notify: Charge Nurse/RN  Name of Charge Nurse/RN Notified Deerica Waszak [place] Ped RN  Date Charge Nurse/RN Notified 02/22/21  Time Charge Nurse/RN Notified 0825  Notify: Provider  Provider Name/Title Dr. Starla Link  Date Provider Notified 02/22/21  Time Provider Notified [phone]  Notification Type Page  Notification Reason Other (Comment) (pt in yellow mews)

## 2021-02-22 NOTE — Care Management Obs Status (Signed)
[place] NOTIFICATION   Patient Details  Name: [name] MRN: [id number] Date of Birth: [birth year]   Medicare Observation Status Notification Given:  Yes    Carles Collet, RN 02/22/2021, 9:18 AM

## 2021-02-22 NOTE — Progress Notes (Signed)
EEG complete - results pending 

## 2021-02-22 NOTE — Progress Notes (Signed)
Patient is resting, appears asleep, will try again for EEG .

## 2021-02-22 DEATH — deceased

## 2021-02-23 ENCOUNTER — Encounter (HOSPITAL_COMMUNITY): Admission: EM | Disposition: E | Payer: Self-pay | Source: Home / Self Care | Attending: Cardiology

## 2021-02-23 ENCOUNTER — Inpatient Hospital Stay (HOSPITAL_COMMUNITY): Payer: Medicare Other | Admitting: Anesthesiology

## 2021-02-23 ENCOUNTER — Telehealth: Payer: Self-pay

## 2021-02-23 ENCOUNTER — Inpatient Hospital Stay (HOSPITAL_COMMUNITY): Payer: Medicare Other

## 2021-02-23 DIAGNOSIS — R55 Syncope and collapse: Secondary | ICD-10-CM

## 2021-02-23 DIAGNOSIS — N179 Acute kidney failure, unspecified: Secondary | ICD-10-CM | POA: Diagnosis not present

## 2021-02-23 DIAGNOSIS — F0390 Unspecified dementia without behavioral disturbance: Secondary | ICD-10-CM

## 2021-02-23 DIAGNOSIS — Z515 Encounter for palliative care: Secondary | ICD-10-CM

## 2021-02-23 HISTORY — PX: TEMPORARY PACEMAKER: CATH118268

## 2021-02-23 LAB — GLUCOSE, CAPILLARY
Glucose-Capillary: 135 mg/dL — ABNORMAL HIGH (ref 70–99)
Glucose-Capillary: 141 mg/dL — ABNORMAL HIGH (ref 70–99)
Glucose-Capillary: 152 mg/dL — ABNORMAL HIGH (ref 70–99)
Glucose-Capillary: 157 mg/dL — ABNORMAL HIGH (ref 70–99)
Glucose-Capillary: 231 mg/dL — ABNORMAL HIGH (ref 70–99)

## 2021-02-23 LAB — MAGNESIUM: Magnesium: 1.6 mg/dL — ABNORMAL LOW (ref 1.7–2.4)

## 2021-02-23 LAB — CBC
HCT: 38.4 % (ref 36.0–46.0)
Hemoglobin: 12.7 g/dL (ref 12.0–15.0)
MCH: 33.5 pg (ref 26.0–34.0)
MCHC: 33.1 g/dL (ref 30.0–36.0)
MCV: 101.3 fL — ABNORMAL HIGH (ref 80.0–100.0)
Platelets: 261 10*3/uL (ref 150–400)
RBC: 3.79 MIL/uL — ABNORMAL LOW (ref 3.87–5.11)
RDW: 12.4 % (ref 11.5–15.5)
WBC: 16.5 10*3/uL — ABNORMAL HIGH (ref 4.0–10.5)
nRBC: 0 % (ref 0.0–0.2)

## 2021-02-23 LAB — POCT I-STAT 7, (LYTES, BLD GAS, ICA,H+H)
Acid-base deficit: 1 mmol/L (ref 0.0–2.0)
Bicarbonate: 26.5 mmol/L (ref 20.0–28.0)
Calcium, Ion: 1.22 mmol/L (ref 1.15–1.40)
HCT: 40 % (ref 36.0–46.0)
Hemoglobin: 13.6 g/dL (ref 12.0–15.0)
O2 Saturation: 98 %
Potassium: 3.6 mmol/L (ref 3.5–5.1)
Sodium: 136 mmol/L (ref 135–145)
TCO2: 28 mmol/L (ref 22–32)
pCO2 arterial: 54.6 mmHg — ABNORMAL HIGH (ref 32.0–48.0)
pH, Arterial: 7.294 — ABNORMAL LOW (ref 7.350–7.450)
pO2, Arterial: 122 mmHg — ABNORMAL HIGH (ref 83.0–108.0)

## 2021-02-23 LAB — LACTIC ACID, PLASMA: Lactic Acid, Venous: 1.1 mmol/L (ref 0.5–1.9)

## 2021-02-23 LAB — BASIC METABOLIC PANEL
Anion gap: 11 (ref 5–15)
BUN: 14 mg/dL (ref 8–23)
CO2: 24 mmol/L (ref 22–32)
Calcium: 8.5 mg/dL — ABNORMAL LOW (ref 8.9–10.3)
Chloride: 101 mmol/L (ref 98–111)
Creatinine, Ser: 1.03 mg/dL — ABNORMAL HIGH (ref 0.44–1.00)
GFR, Estimated: 54 mL/min — ABNORMAL LOW (ref >60)
Glucose, Bld: 255 mg/dL — ABNORMAL HIGH (ref 70–99)
Potassium: 3.6 mmol/L (ref 3.5–5.1)
Sodium: 136 mmol/L (ref 135–145)

## 2021-02-23 LAB — TROPONIN I (HIGH SENSITIVITY)
Troponin I (High Sensitivity): 223 ng/L (ref <18)
Troponin I (High Sensitivity): 374 ng/L (ref <18)

## 2021-02-23 LAB — URINE CULTURE: Culture: >=100000 — AB

## 2021-02-23 LAB — MRSA NEXT GEN BY PCR, NASAL: MRSA by PCR Next Gen: NOT DETECTED

## 2021-02-23 SURGERY — TEMPORARY PACEMAKER
Anesthesia: LOCAL

## 2021-02-23 MED ORDER — LIDOCAINE HCL (PF) 1 % IJ SOLN
INTRAMUSCULAR | Status: AC
  Filled 2021-02-23: qty 30

## 2021-02-23 MED ORDER — LAMOTRIGINE 150 MG PO TABS
150.0000 mg | ORAL_TABLET | Freq: Two times a day (BID) | ORAL | Status: DC
  Administered 2021-02-23 – 2021-02-27 (×8): 150 mg
  Filled 2021-02-23 (×10): qty 1

## 2021-02-23 MED ORDER — ORAL CARE MOUTH RINSE
15.0000 mL | OROMUCOSAL | Status: DC
  Administered 2021-02-23 – 2021-02-27 (×39): 15 mL via OROMUCOSAL

## 2021-02-23 MED ORDER — DONEPEZIL HCL 10 MG PO TABS
10.0000 mg | ORAL_TABLET | Freq: Every day | ORAL | Status: DC
  Administered 2021-02-23 – 2021-02-26 (×4): 10 mg
  Filled 2021-02-23 (×5): qty 1

## 2021-02-23 MED ORDER — MIDAZOLAM HCL 2 MG/2ML IJ SOLN
INTRAMUSCULAR | Status: AC
  Filled 2021-02-23: qty 2

## 2021-02-23 MED ORDER — HEPARIN (PORCINE) IN NACL 1000-0.9 UT/500ML-% IV SOLN
INTRAVENOUS | Status: AC
  Filled 2021-02-23: qty 1000

## 2021-02-23 MED ORDER — SODIUM CHLORIDE 0.9 % IV SOLN: INTRAVENOUS | Status: DC

## 2021-02-23 MED ORDER — FENTANYL CITRATE PF 50 MCG/ML IJ SOSY
PREFILLED_SYRINGE | INTRAMUSCULAR | Status: AC
  Filled 2021-02-23: qty 2

## 2021-02-23 MED ORDER — HEPARIN (PORCINE) IN NACL 1000-0.9 UT/500ML-% IV SOLN
INTRAVENOUS | Status: DC | PRN
  Administered 2021-02-23: 500 mL

## 2021-02-23 MED ORDER — SODIUM CHLORIDE 0.9% FLUSH: 3.0000 mL | INTRAVENOUS | Status: DC | PRN

## 2021-02-23 MED ORDER — SODIUM CHLORIDE 0.9% FLUSH
3.0000 mL | Freq: Two times a day (BID) | INTRAVENOUS | Status: DC
  Administered 2021-02-23 – 2021-02-27 (×7): 3 mL via INTRAVENOUS

## 2021-02-23 MED ORDER — ETOMIDATE 2 MG/ML IV SOLN
INTRAVENOUS | Status: AC
  Filled 2021-02-23: qty 20

## 2021-02-23 MED ORDER — SENNOSIDES-DOCUSATE SODIUM 8.6-50 MG PO TABS: 1.0000 | ORAL_TABLET | Freq: Every evening | ORAL | Status: DC | PRN

## 2021-02-23 MED ORDER — LIDOCAINE HCL (PF) 1 % IJ SOLN
INTRAMUSCULAR | Status: DC | PRN
  Administered 2021-02-23: 10 mL

## 2021-02-23 MED ORDER — SODIUM CHLORIDE 0.9 % IV SOLN
2.0000 g | INTRAVENOUS | Status: DC
  Administered 2021-02-23 – 2021-02-24 (×2): 2 g via INTRAVENOUS
  Filled 2021-02-23 (×3): qty 20

## 2021-02-23 MED ORDER — MIDAZOLAM HCL 2 MG/2ML IJ SOLN
INTRAMUSCULAR | Status: DC | PRN
  Administered 2021-02-23: 2 mg via INTRAVENOUS

## 2021-02-23 MED ORDER — FAMOTIDINE IN NACL 20-0.9 MG/50ML-% IV SOLN
20.0000 mg | INTRAVENOUS | Status: DC
  Administered 2021-02-23 – 2021-02-27 (×5): 20 mg via INTRAVENOUS
  Filled 2021-02-23 (×5): qty 50

## 2021-02-23 MED ORDER — FENTANYL CITRATE (PF) 100 MCG/2ML IJ SOLN
INTRAMUSCULAR | Status: AC
  Administered 2021-02-23: 100 ug
  Filled 2021-02-23: qty 2

## 2021-02-23 MED ORDER — FENTANYL BOLUS VIA INFUSION
25.0000 ug | INTRAVENOUS | Status: DC | PRN
  Administered 2021-02-24 – 2021-02-25 (×7): 50 ug via INTRAVENOUS
  Filled 2021-02-23: qty 100

## 2021-02-23 MED ORDER — PROPOFOL 1000 MG/100ML IV EMUL
0.0000 ug/kg/min | INTRAVENOUS | Status: DC
  Administered 2021-02-23: 40 ug/kg/min via INTRAVENOUS
  Administered 2021-02-23: 10 ug/kg/min via INTRAVENOUS
  Administered 2021-02-24: 30 ug/kg/min via INTRAVENOUS
  Filled 2021-02-23 (×4): qty 100

## 2021-02-23 MED ORDER — SUCCINYLCHOLINE CHLORIDE 200 MG/10ML IV SOSY
PREFILLED_SYRINGE | INTRAVENOUS | Status: DC | PRN
Start: 2021-02-23 — End: 2021-02-23
  Administered 2021-02-23: 100 mg via INTRAVENOUS

## 2021-02-23 MED ORDER — CHLORHEXIDINE GLUCONATE 0.12% ORAL RINSE (MEDLINE KIT)
15.0000 mL | Freq: Two times a day (BID) | OROMUCOSAL | Status: DC
  Administered 2021-02-23 – 2021-02-27 (×7): 15 mL via OROMUCOSAL

## 2021-02-23 MED ORDER — SODIUM CHLORIDE 0.9 % IV SOLN: 250.0000 mL | INTRAVENOUS | Status: DC | PRN

## 2021-02-23 MED ORDER — POLYETHYLENE GLYCOL 3350 17 G PO PACK
17.0000 g | PACK | Freq: Every day | ORAL | Status: DC
  Administered 2021-02-24 – 2021-02-27 (×4): 17 g
  Filled 2021-02-23 (×4): qty 1

## 2021-02-23 MED ORDER — DOCUSATE SODIUM 50 MG/5ML PO LIQD
100.0000 mg | Freq: Two times a day (BID) | ORAL | Status: DC
  Administered 2021-02-23 – 2021-02-27 (×8): 100 mg
  Filled 2021-02-23 (×8): qty 10

## 2021-02-23 MED ORDER — FENTANYL 2500MCG IN NS 250ML (10MCG/ML) PREMIX INFUSION
25.0000 ug/h | INTRAVENOUS | Status: DC
  Administered 2021-02-23: 50 ug/h via INTRAVENOUS
  Administered 2021-02-24 – 2021-02-25 (×2): 125 ug/h via INTRAVENOUS
  Filled 2021-02-23 (×3): qty 250

## 2021-02-23 MED ORDER — CLOPIDOGREL BISULFATE 75 MG PO TABS
75.0000 mg | ORAL_TABLET | Freq: Every day | ORAL | Status: DC
  Administered 2021-02-24: 75 mg
  Filled 2021-02-23: qty 1

## 2021-02-23 MED ORDER — ROCURONIUM BROMIDE 10 MG/ML (PF) SYRINGE
PREFILLED_SYRINGE | INTRAVENOUS | Status: AC
  Filled 2021-02-23: qty 10

## 2021-02-23 MED ORDER — SUCCINYLCHOLINE CHLORIDE 200 MG/10ML IV SOSY
PREFILLED_SYRINGE | INTRAVENOUS | Status: AC
  Filled 2021-02-23: qty 10

## 2021-02-23 MED ORDER — NOREPINEPHRINE 4 MG/250ML-% IV SOLN
0.0000 ug/min | INTRAVENOUS | Status: DC
  Administered 2021-02-23: 5 ug/min via INTRAVENOUS
  Administered 2021-02-23: 10 ug/min via INTRAVENOUS
  Administered 2021-02-23: 9 ug/min via INTRAVENOUS
  Administered 2021-02-24: 5 ug/min via INTRAVENOUS
  Administered 2021-02-25: 1 ug/min via INTRAVENOUS
  Filled 2021-02-23 (×4): qty 250

## 2021-02-23 MED ORDER — CHLORHEXIDINE GLUCONATE CLOTH 2 % EX PADS
6.0000 | MEDICATED_PAD | Freq: Every day | CUTANEOUS | Status: DC
  Administered 2021-02-23 – 2021-02-27 (×5): 6 via TOPICAL

## 2021-02-23 MED ORDER — QUETIAPINE FUMARATE 25 MG PO TABS
25.0000 mg | ORAL_TABLET | Freq: Every day | ORAL | Status: DC
  Administered 2021-02-25 – 2021-02-26 (×2): 25 mg
  Filled 2021-02-23 (×4): qty 1

## 2021-02-23 MED ORDER — PROPOFOL 10 MG/ML IV BOLUS
INTRAVENOUS | Status: DC | PRN
  Administered 2021-02-23: 50 mg via INTRAVENOUS

## 2021-02-23 MED ORDER — FENTANYL CITRATE (PF) 100 MCG/2ML IJ SOLN: 25.0000 ug | Freq: Once | INTRAMUSCULAR | Status: DC

## 2021-02-23 MED ORDER — INSULIN ASPART 100 UNIT/ML IJ SOLN
0.0000 [IU] | INTRAMUSCULAR | Status: DC
  Administered 2021-02-23: 3 [IU] via SUBCUTANEOUS
  Administered 2021-02-23: 1 [IU] via SUBCUTANEOUS
  Administered 2021-02-24: 2 [IU] via SUBCUTANEOUS
  Administered 2021-02-24 (×2): 1 [IU] via SUBCUTANEOUS
  Administered 2021-02-24: 2 [IU] via SUBCUTANEOUS
  Administered 2021-02-25 (×6): 1 [IU] via SUBCUTANEOUS
  Administered 2021-02-26: 2 [IU] via SUBCUTANEOUS
  Administered 2021-02-26: 1 [IU] via SUBCUTANEOUS
  Administered 2021-02-26: 2 [IU] via SUBCUTANEOUS
  Administered 2021-02-26: 1 [IU] via SUBCUTANEOUS
  Administered 2021-02-26: 2 [IU] via SUBCUTANEOUS
  Administered 2021-02-26: 1 [IU] via SUBCUTANEOUS
  Administered 2021-02-27: 3 [IU] via SUBCUTANEOUS
  Administered 2021-02-27: 1 [IU] via SUBCUTANEOUS
  Administered 2021-02-27: 3 [IU] via SUBCUTANEOUS
  Administered 2021-02-27: 2 [IU] via SUBCUTANEOUS

## 2021-02-23 SURGICAL SUPPLY — 12 items
CABLE ADAPT PACING TEMP 12FT (ADAPTER) ×1 IMPLANT
CATH S G BIP PACING (CATHETERS) ×1 IMPLANT
KIT HEART LEFT (KITS) ×3 IMPLANT
MAT PREVALON FULL STRYKER (MISCELLANEOUS) ×1 IMPLANT
PACK CARDIAC CATHETERIZATION (CUSTOM PROCEDURE TRAY) ×3 IMPLANT
PINNACLE LONG 6F 25CM (SHEATH) ×2
SHEATH INTRO PINNACLE 6F 25CM (SHEATH) IMPLANT
SLEEVE REPOSITIONING LENGTH 30 (MISCELLANEOUS) ×1 IMPLANT
TRANSDUCER W/STOPCOCK (MISCELLANEOUS) ×3 IMPLANT
TUBING CIL FLEX 10 FLL-RA (TUBING) ×3 IMPLANT
WIRE EMERALD 3MM-J .035X150CM (WIRE) ×1 IMPLANT
WIRE MICRO SET SILHO 5FR 7 (SHEATH) ×1 IMPLANT

## 2021-02-23 NOTE — Progress Notes (Signed)
Patient ID: CONSTANCIA GEETING, female   DOB: 1934-02-26, 87 y.o.   MRN: [id number]  PROGRESS NOTE    JAMERICA SNAVELY  ZDG:[id number] DOB: 03/19/1934 DOA: 01/29/2021 PCP: Shon Baton, MD   Brief Narrative:  87 yo F with HTN, hypothyroidism, hyperlipidemia, prior CVA, mild dementia presented with syncopal episode associated with possible seizures at home. The day prior to admission, she had just finished dinner and was sitting down in her chair when she suddenly stopped talking, her eyes rolled back in and started to have seizure-like spell at home.  Per family she was shaking her limbs and gurgling.  She was laying on the ground by son and eventually her movements stopped followed by confusion.  This all lasted a few minutes.  She was brought to the ED, neurology consulted and she was admitted on the hospitalist service.   Assessment & Plan:   Possible seizures Syncope: Possibly neurogenic -CT scan on arrival was negative for acute findings, an MRI could not be obtained due to presence of a spinal stimulator.  -LDL 140.  A1c 5.7. --2D echo showed EF 60 to 65%, normal LVEF, no WMA.  She has grade 1 diastolic dysfunction -Carotid ultrasound was without significant stenosis -Neurology signed off on 02/22/2021: Neurology recommended increased dose of lamotrigine 250 mg twice a day.  EEG negative for seizures.  Outpatient follow-up with neurology. -Fall precautions.  Seizure precautions. - PT recommends SNF placement.  Social worker following.  Possible Klebsiella pneumoniae UTI: Present on admission -Currently on Rocephin.  Switch to oral Keflex if able to swallow orally.  Urine cultures growing more than 100,000 colonies per mL of Klebsiella pneumoniae.    Essential hypertension -Blood pressure intermittently on the higher side.  Monitor.  Chronic any disease stage IIIa -most recent creatinine in 2021 ranging between 0.98-1.4, it was around 1.2-1.4 as far back as 2014.  Currently at  baseline  Dementia -Fall precaution.  Monitor mental status.  Prolonged QT interval -Avoid QT prolonging medications  Hypothyroidism-continue levothyroxine  DVT prophylaxis: SCDs Code Status: DNR Family Communication: Spoke to son at bedside and daughter on phone on 03/17/2021 Disposition Plan: Status is: Inpatient because: Of need for SNF placement.    Consultants: Neurology  Procedures: Echo/EEG  Antimicrobials:  Rocephin from 02/21/2021 onwards  Subjective: Patient seen and examined at bedside.  More awake this morning but still slightly confused.  No overnight fever, seizures, agitation reported. Objective: Vitals:   03/02/2021 0052 02/25/2021 0400 03/10/2021 0500 03/16/2021 0713  BP: 127/82 102/74  111/77  Pulse: 72 66  67  Resp: 20 16  (!) 21  Temp: 98.6 F (37 C) 98.5 F (36.9 C)  97.6 F (36.4 C)  TempSrc: Axillary Axillary  Temporal  SpO2: 96% 95%  93%  Weight:   77.5 kg   Height:        Intake/Output Summary (Last 24 hours) at 02/28/2021 0814 Last data filed at 02/25/2021 0732 Gross per 24 hour  Intake 450 ml  Output 600 ml  Net -150 ml    Filed Weights   02/19/2021 1604 03/13/2021 0500  Weight: 72.6 kg 77.5 kg    Examination:  General exam: No distress.  On room air currently.  Looks chronically ill and deconditioned. Respiratory system: Decreased sounds at bases bilaterally with some crackles  cardiovascular system: Currently rate controlled; S1-S2 heard Gastrointestinal system: Abdomen is distended slightly; soft and nontender.  Bowel sounds are heard Extremities: No edema or clubbing Central nervous system: More awake this  morning; still slightly confused.  No focal neurological deficits.  Moves extremities Skin: No obvious ecchymosis/lesions  psychiatry: Cannot assess because of confusion  Data Reviewed: I have personally reviewed following labs and imaging studies  CBC: Recent Labs  Lab 02/18/2021 1917 02/22/21 0106  WBC 8.3 8.3  NEUTROABS 5.5  --    HGB 13.0 12.1  HCT 40.9 37.3  MCV 102.3* 100.5*  PLT 250 976    Basic Metabolic Panel: Recent Labs  Lab 02/13/2021 1917 02/22/21 0106  NA 135 135  K 4.2 4.5  CL 100 104  CO2 28 23  GLUCOSE 198* 112*  BUN 20 15  CREATININE 1.14* 0.93  CALCIUM 9.1 8.6*    GFR: Estimated Creatinine Clearance: 45.4 mL/min (by C-G formula based on SCr of 0.93 mg/dL). Liver Function Tests: Recent Labs  Lab 02/03/2021 1917 02/22/21 0106  AST 15 19  ALT 12 9  ALKPHOS 54 40  BILITOT 0.5 0.9  PROT 7.1 6.3*  ALBUMIN 3.6 3.3*    No results for input(s): LIPASE, AMYLASE in the last 168 hours. Recent Labs  Lab 02/21/21 0630  AMMONIA <10    Coagulation Profile: No results for input(s): INR, PROTIME in the last 168 hours. Cardiac Enzymes: No results for input(s): CKTOTAL, CKMB, CKMBINDEX, TROPONINI in the last 168 hours. BNP (last 3 results) No results for input(s): PROBNP in the last 8760 hours. HbA1C: Recent Labs    02/21/21 0500  HGBA1C 5.7*    CBG: Recent Labs  Lab 02/21/21 1946 02/22/21 0810 02/22/21 1200 02/22/21 1632  GLUCAP 132* 123* 97 96    Lipid Profile: Recent Labs    02/21/21 0500  CHOL 204*  HDL 42  LDLCALC 140*  TRIG 112  CHOLHDL 4.9    Thyroid Function Tests: Recent Labs    02/21/21 0630  TSH 1.430    Anemia Panel: Recent Labs    02/21/21 0630  VITAMINB12 4,743*    Sepsis Labs: No results for input(s): PROCALCITON, LATICACIDVEN in the last 168 hours.  Recent Results (from the past 240 hour(s))  Resp Panel by RT-PCR (Flu A&B, Covid) Nasopharyngeal Swab     Status: None   Collection Time: 02/14/2021  6:01 PM   Specimen: Nasopharyngeal Swab; Nasopharyngeal(NP) swabs in vial transport medium  Result Value Ref Range Status   SARS Coronavirus 2 by RT PCR NEGATIVE NEGATIVE Final    Comment: (NOTE) SARS-CoV-2 target nucleic acids are NOT DETECTED.  The SARS-CoV-2 RNA is generally detectable in upper respiratory specimens during the acute  phase of infection. The lowest concentration of SARS-CoV-2 viral copies this assay can detect is 138 copies/mL. A negative result does not preclude SARS-Cov-2 infection and should not be used as the sole basis for treatment or other patient management decisions. A negative result may occur with  improper specimen collection/handling, submission of specimen other than nasopharyngeal swab, presence of viral mutation(s) within the areas targeted by this assay, and inadequate number of viral copies(<138 copies/mL). A negative result must be combined with clinical observations, patient history, and epidemiological information. The expected result is Negative.  Fact Sheet for Patients:  EntrepreneurPulse.com.au  Fact Sheet for Healthcare Providers:  IncredibleEmployment.be  This test is no t yet approved or cleared by the [place] FDA and  has been authorized for detection and/or diagnosis of SARS-CoV-2 by FDA under an Emergency Use Authorization (EUA). This EUA will remain  in effect (meaning this test can be used) for the duration of the COVID-19 declaration under Section 564(b)(1)  of the Act, 21 U.S.C.section 360bbb-3(b)(1), unless the authorization is terminated  or revoked sooner.       Influenza A by PCR NEGATIVE NEGATIVE Final   Influenza B by PCR NEGATIVE NEGATIVE Final    Comment: (NOTE) The Xpert Xpress SARS-CoV-2/FLU/RSV plus assay is intended as an aid in the diagnosis of influenza from Nasopharyngeal swab specimens and should not be used as a sole basis for treatment. Nasal washings and aspirates are unacceptable for Xpert Xpress SARS-CoV-2/FLU/RSV testing.  Fact Sheet for Patients: EntrepreneurPulse.com.au  Fact Sheet for Healthcare Providers: IncredibleEmployment.be  This test is not yet approved or cleared by the [place] FDA and has been authorized for detection and/or diagnosis of  SARS-CoV-2 by FDA under an Emergency Use Authorization (EUA). This EUA will remain in effect (meaning this test can be used) for the duration of the COVID-19 declaration under Section 564(b)(1) of the Act, 21 U.S.C. section 360bbb-3(b)(1), unless the authorization is terminated or revoked.  Performed at [facility], [place] [address] [place], [place] [address]   Urine Culture     Status: Abnormal   Collection Time: 01/26/2021 11:19 PM   Specimen: Urine, Clean Catch  Result Value Ref Range Status   Specimen Description URINE, CLEAN CATCH  Final   Special Requests   Final    NONE Performed at [facility], [place] [address], [place], [place] [address]    Culture >=100,000 COLONIES/mL KLEBSIELLA PNEUMONIAE (A)  Final   Report Status 03/16/2021 FINAL  Final   Organism ID, Bacteria KLEBSIELLA PNEUMONIAE (A)  Final      Susceptibility   Klebsiella pneumoniae - MIC*    AMPICILLIN >=32 RESISTANT Resistant     CEFAZOLIN <=4 SENSITIVE Sensitive     CEFEPIME <=0.12 SENSITIVE Sensitive     CEFTRIAXONE <=0.25 SENSITIVE Sensitive     CIPROFLOXACIN <=0.25 SENSITIVE Sensitive     GENTAMICIN <=1 SENSITIVE Sensitive     IMIPENEM 0.5 SENSITIVE Sensitive     NITROFURANTOIN 256 RESISTANT Resistant     TRIMETH/SULFA <=20 SENSITIVE Sensitive     AMPICILLIN/SULBACTAM 8 SENSITIVE Sensitive     PIP/TAZO <=4 SENSITIVE Sensitive     * >=100,000 COLONIES/mL KLEBSIELLA PNEUMONIAE          Radiology Studies: EEG adult  Result Date: 03/03/21 Lora Havens, MD     2021-03-03  7:55 AM Patient Name: DNIYAH GRANT MRN: [id number] Epilepsy Attending: Lora Havens Referring Physician/Provider: Dr Kerney Elbe Date: 2021-03-03 Duration: 21.35 mins Patient history:  87 year old female presenting after a shaking spell at home with semiology suggestive of seizure. EEG to evaluate for seizure. Level of alertness: Awake AEDs during EEG study: LTG Technical aspects: This EEG study was done with scalp  electrodes positioned according to the 10-20 International system of electrode placement. Electrical activity was acquired at a sampling rate of 500Hz  and reviewed with a high frequency filter of 70Hz  and a low frequency filter of 1Hz . EEG data were recorded continuously and digitally stored. Description: The posterior dominant rhythm consists of 8 Hz activity of moderate voltage (25-35 uV) seen predominantly in posterior head regions, symmetric and reactive to eye opening and eye closing. EEG showed intermittent generalized 3 to 6 Hz theta-delta slowing. Hyperventilation and photic stimulation were not performed.   ABNORMALITY - Intermittent slow, generalized IMPRESSION: This study is suggestive of mild diffuse encephalopathy, nonspecific etiology. No seizures or epileptiform discharges were seen throughout the recording. Priyanka Barbra Sarks   ECHOCARDIOGRAM COMPLETE  Result Date: 02/21/2021    ECHOCARDIOGRAM REPORT   Patient Name:   JEANETT ANTONOPOULOS Taber Date of Exam: 02/21/2021 Medical Rec #:  [id number]      Height:       64.0 in Accession #:    [id number]     Weight:       160.0 lb Date of Birth:  1934-09-24     BSA:          1.779 m Patient Age:    13 years       BP:           168/79 mmHg Patient Gender: F              HR:           70 bpm. Exam Location:  Inpatient Procedure: 2D Echo, Cardiac Doppler and Color Doppler Indications:    TIA  History:        Patient has no prior history of Echocardiogram examinations.                 TIA; Risk Factors:Hypertension and Sleep Apnea.  Sonographer:    Jyl Heinz Referring Phys: [id number] [place]  1. Left ventricular ejection fraction, by estimation, is 60 to 65%. The left ventricle has normal function. The left ventricle has no regional wall motion abnormalities. There is mild left ventricular hypertrophy. Left ventricular diastolic parameters are consistent with Grade I diastolic dysfunction (impaired relaxation).  2. Right ventricular systolic  function is normal. The right ventricular size is normal.  3. The mitral valve is degenerative. Trivial mitral valve regurgitation. No evidence of mitral stenosis. Moderate mitral annular calcification.  4. Calcified AV with small gradient but AVA 2.3 cm2. The aortic valve is normal in structure. There is moderate calcification of the aortic valve. There is moderate thickening of the aortic valve. Aortic valve regurgitation is not visualized. Aortic valve sclerosis/calcification is present, without any evidence of aortic stenosis.  5. The inferior vena cava is normal in size with greater than 50% respiratory variability, suggesting right atrial pressure of 3 mmHg. FINDINGS  Left Ventricle: Left ventricular ejection fraction, by estimation, is 60 to 65%. The left ventricle has normal function. The left ventricle has no regional wall motion abnormalities. The left ventricular internal cavity size was normal in size. There is  mild left ventricular hypertrophy. Left ventricular diastolic parameters are consistent with Grade I diastolic dysfunction (impaired relaxation). Right Ventricle: The right ventricular size is normal. No increase in right ventricular wall thickness. Right ventricular systolic function is normal. Left Atrium: Left atrial size was normal in size. Right Atrium: Right atrial size was normal in size. Pericardium: There is no evidence of pericardial effusion. Mitral Valve: The mitral valve is degenerative in appearance. There is mild thickening of the mitral valve leaflet(s). There is mild calcification of the mitral valve leaflet(s). Moderate mitral annular calcification. Trivial mitral valve regurgitation. No evidence of mitral valve stenosis. Tricuspid Valve: The tricuspid valve is normal in structure. Tricuspid valve regurgitation is not demonstrated. No evidence of tricuspid stenosis. Aortic Valve: Calcified AV with small gradient but AVA 2.3 cm2. The aortic valve is normal in structure. There is  moderate calcification of the aortic valve. There is moderate thickening of the aortic valve. Aortic valve regurgitation is not visualized. Aortic regurgitation PHT measures 500 msec. Aortic valve sclerosis/calcification is present, without any evidence of aortic stenosis. Aortic valve mean gradient measures 10.0 mmHg. Aortic valve peak gradient measures 14.7 mmHg. Aortic  valve area, by VTI measures 2.16 cm. Pulmonic Valve: The pulmonic valve was normal in structure. Pulmonic valve regurgitation is not visualized. No evidence of pulmonic stenosis. Aorta: The aortic root is normal in size and structure. Venous: The inferior vena cava is normal in size with greater than 50% respiratory variability, suggesting right atrial pressure of 3 mmHg. IAS/Shunts: No atrial level shunt detected by color flow Doppler.  LEFT VENTRICLE PLAX 2D LVIDd:         4.10 cm     Diastology LVIDs:         2.80 cm     LV e' medial:    6.74 cm/s LV PW:         1.00 cm     LV E/e' medial:  12.7 LV IVS:        1.30 cm     LV e' lateral:   7.40 cm/s LVOT diam:     2.00 cm     LV E/e' lateral: 11.6 LV SV:         101 LV SV Index:   57 LVOT Area:     3.14 cm  LV Volumes (MOD) LV vol d, MOD A2C: 86.9 ml LV vol d, MOD A4C: 91.4 ml LV vol s, MOD A2C: 36.1 ml LV vol s, MOD A4C: 35.3 ml LV SV MOD A2C:     50.8 ml LV SV MOD A4C:     91.4 ml LV SV MOD BP:      56.2 ml RIGHT VENTRICLE            IVC RV Basal diam:  3.60 cm    IVC diam: 1.20 cm RV Mid diam:    3.30 cm RV S prime:     9.90 cm/s TAPSE (M-mode): 1.9 cm LEFT ATRIUM             Index        RIGHT ATRIUM           Index LA diam:        3.20 cm 1.80 cm/m   RA Area:     13.70 cm LA Vol (A2C):   48.3 ml 27.14 ml/m  RA Volume:   32.40 ml  18.21 ml/m LA Vol (A4C):   49.1 ml 27.59 ml/m LA Biplane Vol: 52.2 ml 29.34 ml/m  AORTIC VALVE AV Area (Vmax):    2.40 cm AV Area (Vmean):   2.29 cm AV Area (VTI):     2.16 cm AV Vmax:           191.50 cm/s AV Vmean:          154.000 cm/s AV VTI:             0.466 m AV Peak Grad:      14.7 mmHg AV Mean Grad:      10.0 mmHg LVOT Vmax:         146.00 cm/s LVOT Vmean:        112.500 cm/s LVOT VTI:          0.321 m LVOT/AV VTI ratio: 0.69 AI PHT:            500 msec  AORTA Ao Root diam: 2.90 cm Ao Asc diam:  3.30 cm MITRAL VALVE                TRICUSPID VALVE MV Area (PHT): 4.86 cm     TR Peak grad:   41.0 mmHg MV Decel Time: 156 msec  TR Vmax:        320.00 cm/s MV E velocity: 85.90 cm/s MV A velocity: 147.00 cm/s  SHUNTS MV E/A ratio:  0.58         Systemic VTI:  0.32 m                             Systemic Diam: 2.00 cm Jenkins Rouge MD Electronically signed by Jenkins Rouge MD Signature Date/Time: 02/21/2021/11:19:06 AM    Final    VAS US CAROTID (at [facility] and [facility] only)  Result Date: 02/21/2021 Carotid Arterial Duplex Study Patient Name:  DAZIA LIPPOLD Harn  Date of Exam:   02/21/2021 Medical Rec #: [id number]       Accession #:    [id number] Date of Birth: 07-19-1934      Patient Gender: F Patient Age:   34 years Exam Location:  [facility] Procedure:      VAS US CAROTID Referring Phys: Harrold Donath --------------------------------------------------------------------------------  Indications:       TIA. Risk Factors:      Hypertension, hyperlipidemia, Diabetes, prior CVA. Comparison Study:  no prior Performing Technologist: Archie Patten RVS  Examination Guidelines: A complete evaluation includes B-mode imaging, spectral Doppler, color Doppler, and power Doppler as needed of all accessible portions of each vessel. Bilateral testing is considered an integral part of a complete examination. Limited examinations for reoccurring indications may be performed as noted.  Right Carotid Findings: +----------+--------+--------+--------+-------------------------+--------+             PSV cm/s EDV cm/s Stenosis Plaque Description        Comments  +----------+--------+--------+--------+-------------------------+--------+  CCA Prox   92       17                 heterogenous                        +----------+--------+--------+--------+-------------------------+--------+  CCA Distal 84       12                heterogenous                        +----------+--------+--------+--------+-------------------------+--------+  ICA Prox   93       23       1-39%    heterogenous and calcific           +----------+--------+--------+--------+-------------------------+--------+  ICA Distal 61       16                                                    +----------+--------+--------+--------+-------------------------+--------+  ECA        83       7                                                     +----------+--------+--------+--------+-------------------------+--------+ +----------+--------+-------+--------+-------------------+             PSV cm/s EDV cms Describe Arm Pressure (mmHG)  +----------+--------+-------+--------+-------------------+  Subclavian 99                                             +----------+--------+-------+--------+-------------------+ +---------+--------+--+--------+--+---------+  Vertebral PSV cm/s 89 EDV cm/s 15 Antegrade  +---------+--------+--+--------+--+---------+  Left Carotid Findings: +----------+--------+--------+--------+---------------------+------------------+             PSV cm/s EDV cm/s Stenosis Plaque Description    Comments            +----------+--------+--------+--------+---------------------+------------------+  CCA Prox   110      13                heterogenous                              +----------+--------+--------+--------+---------------------+------------------+  CCA Distal 73       13                heterogenous                              +----------+--------+--------+--------+---------------------+------------------+  ICA Prox   61       15       1-39%    heterogenous and      shadowing,                                                 calcific              tortuous             +----------+--------+--------+--------+---------------------+------------------+  ICA Distal 75       17                                                          +----------+--------+--------+--------+---------------------+------------------+  ECA        170                                                                  +----------+--------+--------+--------+---------------------+------------------+ +----------+--------+--------+--------+-------------------+             PSV cm/s EDV cm/s Describe Arm Pressure (mmHG)  +----------+--------+--------+--------+-------------------+  Subclavian 33                                              +----------+--------+--------+--------+-------------------+ +---------+--------+--+--------+----------+  Vertebral PSV cm/s 95 EDV cm/s Retrograde  +---------+--------+--+--------+----------+   Summary: Right Carotid: Velocities in the right ICA are consistent with a 1-39% stenosis. Left Carotid: Velocities in the left ICA are consistent with a 1-39% stenosis. Vertebrals: Right vertebral artery demonstrates antegrade flow. Left vertebral             artery demonstrates retrograde flow. *See table(s) above for measurements and observations.  Electronically signed by Monica Martinez MD on 02/21/2021 at 11:35:28 AM.    Final         Scheduled Meds:   stroke: mapping our early stages of recovery book   Does  not apply Once   clopidogrel  75 mg Oral Q breakfast   donepezil  10 mg Oral QHS   DULoxetine  90 mg Oral QHS   lamoTRIgine  150 mg Oral BID   levothyroxine  100 mcg Oral Daily   nitrofurantoin  100 mg Oral Daily   QUEtiapine  25 mg Oral QHS   Continuous Infusions:  sodium chloride 50 mL/hr at 03/16/2021 0157   cefTRIAXone (ROCEPHIN)  IV 1 g (02/22/21 1505)          Aline August, MD [facility] 02/28/2021, 8:14 AM

## 2021-02-23 NOTE — Procedures (Addendum)
Central Venous Catheter Insertion Procedure Note  [name]  [id number]  [birth year]  Date:03/21/2021  Time:11:21 AM   Provider Performing:Darian Cansler Jerilynn Mages Coy Saunas   Procedure: Insertion of Non-tunneled Central Venous [phone]) with US guidance ([id number])   Indication(s) Medication administration  Consent Risks of the procedure as well as the alternatives and risks of each were explained to the patient and/or caregiver.  Consent for the procedure was obtained and is signed in the bedside chart  Anesthesia Topical only with 1% lidocaine   Timeout Verified patient identification, verified procedure, site/side was marked, verified correct patient position, special equipment/implants available, medications/allergies/relevant history reviewed, required imaging and test results available.  Sterile Technique Maximal sterile technique including full sterile barrier drape, hand hygiene, sterile gown, sterile gloves, mask, hair covering, sterile ultrasound probe cover (if used).  Procedure Description Area of catheter insertion was cleaned with chlorhexidine and draped in sterile fashion.  With real-time ultrasound guidance a central venous catheter was placed into the left internal jugular vein. Nonpulsatile blood flow and easy flushing noted in all ports.  The catheter was sutured in place and sterile dressing applied.  Complications/Tolerance None; patient tolerated the procedure well. Chest X-ray is ordered to verify placement for internal jugular or subclavian cannulation.   Chest x-ray is not ordered for femoral cannulation.  EBL Minimal  Specimen(s) None

## 2021-02-23 NOTE — Progress Notes (Signed)
Patient unavailable for STAT EEG.  Per Loma Sousa, RN, Dr. Tamala Julian wants to hold off on EEG for now.

## 2021-02-23 NOTE — Progress Notes (Signed)
RN and patient at bedside. Called received from Telemonitoring department stating bradycardia. Patient found in room by RN. Patient gasping for air; unable to communicate. HR 39 per monitor. Patient non-responsive; Rapid Response Team called; Agricultural consultant notified. [facility] Rapid Response Team at bedside. Rapid response team notified by RN of patient DNR status. Per patient family request, all emergency life saving interventions should be implemented at this time. Emergency interventions initiated by [facility]. Patient to be transferred to ICU unit. RN to accompany patient during transfer. Report given to ICU RN.

## 2021-02-23 NOTE — Progress Notes (Signed)
Spoke with daughter and son. We agree to treat aggressively unless we think this is irreversible. Will get cardiology to evaluate strips prior to arrest and help tease out if a PPM may be beneficial.  Erskine Emery MD PCCM

## 2021-02-23 NOTE — Consult Note (Addendum)
NAME:  [name], MRN:  [id number], DOB:  07/26/1934, LOS: 1 ADMISSION DATE:  01/27/2021, CONSULTATION DATE:  03/20/2021 REFERRING MD:  TRH, CHIEF COMPLAINT:  syncope   History of Present Illness:  87 year old woman with hx of hypothyroidism, prior CVA, dementia who presented with syncopal episode with possible seizure like activity at home on 01/24/2021.  Workup so far has revealed grade 1 diastolic dysfunction, no evidence of ongoing seizures, possible UTI.  Was being worked up for SNF placement but this AM became more somnolent then unresponsive.  Son at bedside reversed DNR status.  PEA arrest and given CPR, brief, woke back up but not protecting airway, gurgling respirations.  Intubated by anesthesia.  Pertinent  Medical History  GERD Dementia Prior cerebellar infarct DM2 PMR HTN Hypothyroidism PUD  Significant Hospital Events: Including procedures, antibiotic start and stop dates in addition to other pertinent events   01/25/2021 admitted 03/07/2021 code  Interim History / Subjective:  Consulted.  Objective   Blood pressure 111/77, pulse 67, temperature 97.6 F (36.4 C), temperature source Temporal, resp. rate (!) 21, height 5\' 4"  (1.626 m), weight 77.5 kg, SpO2 93 %.        Intake/Output Summary (Last 24 hours) at 02/22/2021 1540 Last data filed at 02/22/2021 0732 Gross per 24 hour  Intake 450 ml  Output 600 ml  Net -150 ml   Filed Weights   02/11/2021 1604 03/06/2021 0500  Weight: 72.6 kg 77.5 kg    Examination: General: ill appearing woman agonally breathing HENT: MMM, trachea midline Lungs: diminished bilaterally, + accessory muscle use Cardiovascular: RRR, ext warm Abdomen: soft, hypoactive BS Extremities: no edema Neuro: withdraws x 4 Skin: pale  Resolved Hospital Problem list   N/a  Assessment & Plan:  In hospital cardiac arrest in context of syncope workup. No clear prodrome other than being more lethargic all day yesterday.  Tele reveals complete heart  block. Acute hypoxemic respiratory failure due to inability to protect airway and likely aspiration. Multiple comorbidities include GERD, PMR, CVA, dementia, gait abnormality Klebsiella UTI Prior DNR- rescinded in context of arrest  - Vent bundle, prop/fent for sedation - Seemed purposeful after brief arrest, will maintain normothermia - Continue lamictal, check EEG - CTX x 5-7 days - Will have cardiology review tele and see if any role for PPM - Will re-discuss GOC with family - Finish course of CTX  Best Practice (right click and Systems analyst" daily)   Diet/type: NPO DVT prophylaxis: SCD GI prophylaxis: pepcid Lines: N/A Foley:  N/A Code Status:  full code Last date of multidisciplinary goals of care discussion [pending]  Labs   CBC: Recent Labs  Lab 02/19/2021 1917 02/22/21 0106  WBC 8.3 8.3  NEUTROABS 5.5  --   HGB 13.0 12.1  HCT 40.9 37.3  MCV 102.3* 100.5*  PLT 250 086   Basic Metabolic Panel: Recent Labs  Lab 02/19/2021 1917 02/22/21 0106  NA 135 135  K 4.2 4.5  CL 100 104  CO2 28 23  GLUCOSE 198* 112*  BUN 20 15  CREATININE 1.14* 0.93  CALCIUM 9.1 8.6*   GFR: Estimated Creatinine Clearance: 45.4 mL/min (by C-G formula based on SCr of 0.93 mg/dL). Recent Labs  Lab 01/23/2021 1917 02/22/21 0106  WBC 8.3 8.3    Liver Function Tests: Recent Labs  Lab 02/19/2021 1917 02/22/21 0106  AST 15 19  ALT 12 9  ALKPHOS 54 40  BILITOT 0.5 0.9  PROT 7.1 6.3*  ALBUMIN 3.6 3.3*   No results for input(s): LIPASE, AMYLASE in the last 168 hours. Recent Labs  Lab 02/21/21 0630  AMMONIA <10    ABG    Component Value Date/Time   TCO2 23 09/08/2012 1851     Coagulation Profile: No results for input(s): INR, PROTIME in the last 168 hours.  Cardiac Enzymes: No results for input(s): CKTOTAL, CKMB, CKMBINDEX, TROPONINI in the last 168 hours.  HbA1C: Hgb A1c MFr Bld  Date/Time Value Ref Range Status  02/21/2021 05:00 AM 5.7 (H) 4.8  - 5.6 % Final    Comment:    (NOTE) Pre diabetes:          5.7%-6.4%  Diabetes:              >6.4%  Glycemic control for   <7.0% adults with diabetes   07/06/2019 04:40 AM 5.7 (H) 4.8 - 5.6 % Final    Comment:    (NOTE) Pre diabetes:          5.7%-6.4% Diabetes:              >6.4% Glycemic control for   <7.0% adults with diabetes     CBG: Recent Labs  Lab 02/21/21 1946 02/22/21 0810 02/22/21 1200 02/22/21 1632 03/12/2021 0938  GLUCAP 132* 123* 97 96 152*    Review of Systems:   comatose  Past Medical History:  She,  has a past medical history of Abnormality of gait, Arthritis, Bifascicular bundle branch block, Degenerative spinal arthritis, Dementia (HCC), Dyspnea on exertion, GERD (gastroesophageal reflux disease), History of CVA (cerebrovascular accident), History of gastric ulcer, History of kidney stones, History of squamous cell carcinoma excision, Hyperlipidemia, Hypertension, Hypothyroidism, Obstructive sleep apnea, Polymyalgia rheumatica ([place]), Stroke ([place]), Type 2 diabetes mellitus ([place]), and Urge urinary incontinence.   Surgical History:   Past Surgical History:  Procedure Laterality Date   APPENDECTOMY  as child   HERNIA REPAIR  1956   INTERSTIM IMPLANT PLACEMENT N/A 11/19/2014   Procedure: INTERSTIM IMPLANT FIRST STAGE AND ;  Surgeon: Bjorn Loser, MD;  Location: [facility];  Service: Urology;  Laterality: N/A;   INTERSTIM IMPLANT PLACEMENT N/A 11/19/2014   Procedure: [place] IMPLANT SECOND STAGE;  Surgeon: Bjorn Loser, MD;  Location: [facility];  Service: Urology;  Laterality: N/A;   ORIF RIGHT WRIST FX  03-04-2004   SHOULDER ARTHROSCOPY / DEBRIDEMENT/  SAD/  DCR/  MINI OPEN ROTATOR CUFF REPAIR Right 01-09-2003   TEE WITHOUT CARDIOVERSION N/A 09/12/2012   Procedure: TRANSESOPHAGEAL ECHOCARDIOGRAM (TEE);  Surgeon: Lelon Perla, MD;  Location: [facility] ENDOSCOPY;  Service: Cardiovascular;  Laterality: N/A;  proximal  septal thickening and chordal SAM creatin a narrow LVOT; turbulence noted   TONSILLECTOMY  as child   TOTAL KNEE ARTHROPLASTY Left 11-12-2004   TRANSTHORACIC ECHOCARDIOGRAM  09-10-2012   mild LVH, mild  focal basal hypertrophy of the septum,  ef 75-10%, grade I diastolic dysfunction/  trival AR /  MV with moderate systolic anterior motion of the chordal structures/  mild LAE     Social History:   reports that she quit smoking about 16 years ago. Her smoking use included cigarettes. She has never used smokeless tobacco. She reports that she does not drink alcohol and does not use drugs.   Family History:  Her family history includes Heart attack in her sister; Heart failure in her mother.   Allergies Allergies  Allergen Reactions   Adhesive [Tape] Other (See Comments)    Irritation and  pulls off skin   Levofloxacin     Other reaction(s): not effective   Omeprazole     Other reaction(s): headache     Home Medications  Prior to Admission medications   Medication Sig Start Date End Date Taking? Authorizing Provider  acetaminophen (TYLENOL) 500 MG tablet Take 1,000 mg by mouth 2 (two) times daily.   Yes [provider]  cholecalciferol (VITAMIN D3) 25 MCG (1000 UNIT) tablet Take 1,000 Units by mouth daily.   Yes [provider]  donepezil (ARICEPT) 10 MG tablet Take 10 mg by mouth at bedtime.   Yes [provider]  DULoxetine (CYMBALTA) 30 MG capsule Take 30 mg by mouth at bedtime. Takes along with 60 mg to total 90 mg   Yes [provider]  DULoxetine (CYMBALTA) 60 MG capsule Take 90 mg by mouth at bedtime. Takes along with 30 mg to total 90 mg.   Yes [provider]  folic acid (FOLVITE) 1 MG tablet Take 1 tablet (1 mg total) by mouth 2 (two) times daily. 04/10/12  Yes Angiulli, Lavon Paganini, PA-C  lamoTRIgine (LAMICTAL) 150 MG tablet Take 150 mg by mouth every morning.   Yes [provider]  levothyroxine (SYNTHROID, LEVOTHROID) 100 MCG  tablet Take 1 tablet (100 mcg total) by mouth daily. 04/10/12  Yes Angiulli, Lavon Paganini, PA-C  nitrofurantoin (MACRODANTIN) 100 MG capsule Take 100 mg by mouth daily. Continuous   Yes [provider]  OXYGEN Inhale 3 L into the lungs at bedtime.   Yes [provider]  QUEtiapine (SEROQUEL) 25 MG tablet Take 25 mg by mouth at bedtime.   Yes [provider]  acetaminophen (TYLENOL) 325 MG tablet Take 2 tablets (650 mg total) by mouth every 6 (six) hours as needed for mild pain or moderate pain (or Fever >/= 101). Patient not taking: Reported on 02/19/2021 07/10/19   Oswald Hillock, MD  albuterol (VENTOLIN HFA) 108 (90 Base) MCG/ACT inhaler Inhale 2 puffs into the lungs every 4 (four) hours as needed for wheezing or shortness of breath.  Patient not taking: Reported on 01/29/2021    [provider]  clopidogrel (PLAVIX) 75 MG tablet Take 1 tablet (75 mg total) by mouth daily with breakfast. Patient not taking: Reported on 01/06/2021 09/12/12   Dhungel, Flonnie Overman, MD  vitamin B-12 (CYANOCOBALAMIN) 1000 MCG tablet Take 1 tablet (1,000 mcg total) by mouth daily. Patient not taking: Reported on 01/24/2021 04/10/12   Cathlyn Parsons, PA-C     Critical care time: 41 minutes not including any separately billable procedures

## 2021-02-23 NOTE — Telephone Encounter (Signed)
Patient's son-[name] called and left a message advising that patient is currently in ICU at [facility]. Patient was scheduled to see patient on tomorrow and he requested that this visit be cancelled. Palliative admin team was notified for update.

## 2021-02-23 NOTE — Progress Notes (Signed)
[facility] ([facility])  Hospital Liaison: RN note         This patient has been referred to our palliative care services in the community.  [facility] will continue to follow for any discharge planning needs and to coordinate continuation of palliative care in the outpatient setting.    If you have questions or need assistance, please call [phone] or contact the hospital Liaison listed on AMION.      Thank you for this referral.         Mary Anne Robertson, RN, CCM  [facility] Hospital Liaison   [phone] 

## 2021-02-23 NOTE — H&P (View-Only) (Signed)
CARDIOLOGY CONSULT NOTE  Patient ID: [name] MRN: [id number] DOB/AGE: December 13, 1934 87 y.o.  Admit date: 02/02/2021 Referring Physician  Erskine Emery, MD Primary Physician:  Shon Baton, MD Reason for Consultation  Cardiac arrest  Patient ID: [name], female    DOB: 1934/06/11, 87 y.o.   MRN: [id number]  Chief Complaint  Patient presents with   Seizures   HPI:    [name]  is a 87 y.o. Caucasian female patient with hypertension, hyperlipidemia, diabetes mellitus, prior stroke, dementia, found to have seizure episode at home and altered mental status, was admitted to the hospital on 02/07/2021 with seizures and syncope.  This morning, telemetry called stating that she is bradycardic, and when the nurse walked in, patient was unresponsive and having seizure episode.  I was called to see the patient after she was resuscitated, intubated and was in the ICU by Dr. Erskine Emery.  History obtained by review of charts.  Patient is presently intubated.  Past Medical History:  Diagnosis Date   Abnormality of gait    Arthritis    Bifascicular bundle branch block    chronic   Degenerative spinal arthritis    cervical and lumbar   Dementia (HCC)    early onset   Dyspnea on exertion    GERD (gastroesophageal reflux disease)    History of CVA (cerebrovascular accident)    03-31-2012--  right cerebellar infart without hemorrhgia   History of gastric ulcer    2011  w/ bleed   History of kidney stones    History of squamous cell carcinoma excision    Hyperlipidemia    Hypertension    Hypothyroidism    Obstructive sleep apnea    has not been using -- but states has appointment soon to get refitted    Polymyalgia rheumatica ([place])    Stroke ([place])    Type 2 diabetes mellitus ([place])    Urge urinary incontinence    refactory   Past Surgical History:  Procedure Laterality Date   APPENDECTOMY  as child   HERNIA REPAIR  1956   INTERSTIM IMPLANT PLACEMENT N/A 11/19/2014    Procedure: INTERSTIM IMPLANT FIRST STAGE AND ;  Surgeon: Bjorn Loser, MD;  Location: [facility];  Service: Urology;  Laterality: N/A;   INTERSTIM IMPLANT PLACEMENT N/A 11/19/2014   Procedure: [place]rie [place] IMPLANT SECOND STAGE;  Surgeon: Bjorn Loser, MD;  Location: [facility];  Service: Urology;  Laterality: N/A;   ORIF RIGHT WRIST FX  03-04-2004   SHOULDER ARTHROSCOPY / DEBRIDEMENT/  SAD/  DCR/  MINI OPEN ROTATOR CUFF REPAIR Right 01-09-2003   TEE WITHOUT CARDIOVERSION N/A 09/12/2012   Procedure: TRANSESOPHAGEAL ECHOCARDIOGRAM (TEE);  Surgeon: Lelon Perla, MD;  Location: [facility] ENDOSCOPY;  Service: Cardiovascular;  Laterality: N/A;  proximal septal thickening and chordal SAM creatin a narrow LVOT; turbulence noted   TONSILLECTOMY  as child   TOTAL KNEE ARTHROPLASTY Left 11-12-2004   TRANSTHORACIC ECHOCARDIOGRAM  09-10-2012   mild LVH, mild  focal basal hypertrophy of the septum,  ef 48-18%, grade I diastolic dysfunction/  trival AR /  MV with moderate systolic anterior motion of the chordal structures/  mild LAE   Social History   Tobacco Use   Smoking status: Former    Types: Cigarettes    Quit date: 11/02/2004    Years since quitting: 16.3   Smokeless tobacco: Never  Substance Use Topics   Alcohol use: No    Family History  Problem Relation  Age of Onset   Heart failure Mother    Heart attack Sister     Marital Status: Widowed  ROS  Review of Systems  Unable to perform ROS: Intubated  Objective   Vitals with BMI 03/22/2021 03/02/2021 02/28/2021  Height - - -  Weight - - -  BMI - - -  Systolic 553 748 79  Diastolic 97 74 67  Pulse 84 81 78    Blood pressure (!) 132/97, pulse 84, temperature 98.8 F (37.1 C), temperature source Oral, resp. rate 18, height _0  (1.626 m), weight 77.5 kg, SpO2 98 %.    Physical Exam Constitutional:      Appearance: She is obese.     Interventions: She is sedated and intubated.  HENT:     Mouth/Throat:      Mouth: Mucous membranes are moist.  Eyes:     Conjunctiva/sclera: Conjunctivae normal.  Neck:     Vascular: No carotid bruit.     Comments: Cannot make of JVD due to patient being intubated and has a central line on the left. Cardiovascular:     Rate and Rhythm: Normal rate and regular rhythm.     Pulses: Intact distal pulses.     Heart sounds: Normal heart sounds. No murmur heard.   No gallop.  Pulmonary:     Effort: Pulmonary effort is normal. She is intubated.     Breath sounds: Normal breath sounds.  Abdominal:     General: Bowel sounds are normal.     Palpations: Abdomen is soft.  Musculoskeletal:     Right lower leg: No edema.     Left lower leg: No edema.  Skin:    General: Skin is warm.     Capillary Refill: Capillary refill takes less than 2 seconds.   Laboratory examination:   Recent Labs    02/02/2021 1917 02/22/21 0106 03/24/2021 1138 03/15/2021 1208  NA 135 135 136 136  K 4.2 4.5 3.6 3.6  CL 100 104 101  --   CO2 _1 --   GLUCOSE 198* 112* 255*  --   BUN _2 --   CREATININE 1.14* 0.93 1.03*  --   CALCIUM 9.1 8.6* 8.5*  --   GFRNONAA 47* >60 54*  --    estimated creatinine clearance is 41 mL/min (A) (by C-G formula based on SCr of 1.03 mg/dL (H)).  CMP Latest Ref Rng & Units 02/22/2021 03/18/2021 02/22/2021  Glucose 70 - 99 mg/dL - 255(H) 112(H)  BUN 8 - 23 mg/dL - 14 15  Creatinine 0.44 - 1.00 mg/dL - 1.03(H) 0.93  Sodium 135 - 145 mmol/L 136 136 135  Potassium 3.5 - 5.1 mmol/L 3.6 3.6 4.5  Chloride 98 - 111 mmol/L - 101 104  CO2 22 - 32 mmol/L - 24 23  Calcium 8.9 - 10.3 mg/dL - 8.5(L) 8.6(L)  Total Protein 6.5 - 8.1 g/dL - - 6.3(L)  Total Bilirubin 0.3 - 1.2 mg/dL - - 0.9  Alkaline Phos 38 - 126 U/L - - 40  AST 15 - 41 U/L - - 19  ALT 0 - 44 U/L - - 9   CBC Latest Ref Rng & Units 02/26/2021 03/02/2021 02/22/2021  WBC 4.0 - 10.5 K/uL - 16.5(H) 8.3  Hemoglobin 12.0 - 15.0 g/dL 13.6 12.7 12.1  Hematocrit 36.0 - 46.0 % 40.0 38.4 37.3  Platelets 150  - 400 K/uL - 261 200   Lipid Panel Recent Labs  02/21/21 0500  CHOL 204*  TRIG 112  LDLCALC 140*  VLDL 22  HDL 42  CHOLHDL 4.9    HEMOGLOBIN A1C Lab Results  Component Value Date   HGBA1C 5.7 (H) 02/21/2021   MPG 116.89 02/21/2021   TSH Recent Labs    02/21/21 0630  TSH 1.430   BNP (last 3 results) No results for input(s): BNP in the last 8760 hours.  Cardiac Panel (last 3 results) Recent Labs    03/11/2021 1138 03/06/2021 1154  TROPONINIHS 223* 374*     Medications and allergies   Allergies  Allergen Reactions   Adhesive [Tape] Other (See Comments)    Irritation and pulls off skin   Levofloxacin     Other reaction(s): not effective   Omeprazole     Other reaction(s): headache     Current Meds  Medication Sig   acetaminophen (TYLENOL) 500 MG tablet Take 1,000 mg by mouth 2 (two) times daily.   cholecalciferol (VITAMIN D3) 25 MCG (1000 UNIT) tablet Take 1,000 Units by mouth daily.   donepezil (ARICEPT) 10 MG tablet Take 10 mg by mouth at bedtime.   DULoxetine (CYMBALTA) 30 MG capsule Take 30 mg by mouth at bedtime. Takes along with 60 mg to total 90 mg   DULoxetine (CYMBALTA) 60 MG capsule Take 90 mg by mouth at bedtime. Takes along with 30 mg to total 90 mg.   folic acid (FOLVITE) 1 MG tablet Take 1 tablet (1 mg total) by mouth 2 (two) times daily.   lamoTRIgine (LAMICTAL) 150 MG tablet Take 150 mg by mouth every morning.   levothyroxine (SYNTHROID, LEVOTHROID) 100 MCG tablet Take 1 tablet (100 mcg total) by mouth daily.   nitrofurantoin (MACRODANTIN) 100 MG capsule Take 100 mg by mouth daily. Continuous   OXYGEN Inhale 3 L into the lungs at bedtime.   QUEtiapine (SEROQUEL) 25 MG tablet Take 25 mg by mouth at bedtime.    Scheduled Meds:   stroke: mapping our early stages of recovery book   Does not apply Once   chlorhexidine gluconate (MEDLINE KIT)  15 mL Mouth Rinse BID   Chlorhexidine Gluconate Cloth  6 each Topical Daily   clopidogrel  75 mg Per  Tube Q breakfast   docusate  100 mg Per Tube BID   donepezil  10 mg Per Tube QHS   fentaNYL (SUBLIMAZE) injection  25 mcg Intravenous Once   insulin aspart  0-9 Units Subcutaneous Q4H   lamoTRIgine  150 mg Per Tube BID   levothyroxine  100 mcg Oral Daily   mouth rinse  15 mL Mouth Rinse 10 times per day   polyethylene glycol  17 g Per Tube Daily   QUEtiapine  25 mg Per Tube QHS   sodium chloride flush  3 mL Intravenous Q12H   Continuous Infusions:  sodium chloride 50 mL/hr at 03/08/2021 0157   cefTRIAXone (ROCEPHIN)  IV 2 g (03/15/2021 1315)   famotidine (PEPCID) IV 20 mg (03/17/2021 1245)   fentaNYL infusion INTRAVENOUS 100 mcg/hr (03/06/2021 1316)   norepinephrine (LEVOPHED) Adult infusion 9 mcg/min (03/18/2021 1140)   propofol (DIPRIVAN) infusion 40 mcg/kg/min (03/19/2021 1317)   PRN Meds:.acetaminophen **OR** acetaminophen (TYLENOL) oral liquid 160 mg/5 mL **OR** acetaminophen, fentaNYL, ondansetron (ZOFRAN) IV, senna-docusate   I/O last 3 completed shifts: In: 1265.4 [P.O.:60; I.V.:1205.4] Out: 300 [Urine:300] Total I/O In: -  Out: 300 [Urine:300]    Radiology:   DG Chest 1 View  Result Date: 02/28/2021 CLINICAL DATA:  Central line placement.  EXAM: CHEST  1 VIEW COMPARISON:  Same day. FINDINGS: Stable position of endotracheal and nasogastric tubes. Interval placement of left internal jugular catheter with distal tip in expected position of the SVC. Pneumothorax is noted. IMPRESSION: Interval placement of left internal jugular catheter with distal tip in expected position of the SVC. Electronically Signed   By: Marijo Conception M.D.   On: 03/24/2021 11:32   DG Abd 1 View  Result Date: 03/06/2021 CLINICAL DATA:  OG tube placement. EXAM: ABDOMEN - 1 VIEW COMPARISON:  03/31/2012 FINDINGS: An enteric tube is present with tip overlying the left upper quadrant in the expected region of the proximal gastric body and with the side hole also below the diaphragm. No dilated loops of bowel are seen  in the included upper portion of the abdomen. The lungs and mediastinum more better evaluated on today's earlier chest radiograph. IMPRESSION: Enteric tube placement as above. Electronically Signed   By: Logan Bores M.D.   On: 03/21/2021 13:36   DG Chest Port 1 View  Result Date: 03/12/2021 CLINICAL DATA:  Endotracheal tube and nasogastric tube placement. EXAM: PORTABLE CHEST 1 VIEW COMPARISON:  February 20, 2021 FINDINGS: The heart size and mediastinal contours are stable. Endotracheal tube is identified distal tip 5 cm from carina. Nasogastric tube is identified with distal tip in the proximal stomach. Mild increased pulmonary interstitium is identified in the bilateral upper lobes. There is no pleural effusion or focal pneumonia. There is no pneumothorax. The visualized skeletal structures are unremarkable. IMPRESSION: Endotracheal tube identified distal tip 5 cm from carina. Nasogastric tube distal tip in the proximal stomach. No pneumothorax. Mild increased pulmonary interstitium is identified in bilateral upper lobes. This can be seen in mild edema. Electronically Signed   By: Abelardo Diesel M.D.   On: 03/08/2021 10:32   EEG adult  Result Date: 02/22/2021 Lora Havens, MD     02/22/2021  7:55 AM Patient Name: [name] MRN: [id number] Epilepsy Attending: Lora Havens Referring Physician/Provider: Dr Kerney Elbe Date: 02/22/2021 Duration: 21.35 mins Patient history:  87 year old female presenting after a shaking spell at home with semiology suggestive of seizure. EEG to evaluate for seizure. Level of alertness: Awake AEDs during EEG study: LTG Technical aspects: This EEG study was done with scalp electrodes positioned according to the 10-20 International system of electrode placement. Electrical activity was acquired at a sampling rate of _0  and reviewed with a high frequency filter of _1  and a low frequency filter of _2 . EEG data were recorded continuously and digitally stored.  Description: The posterior dominant rhythm consists of 8 Hz activity of moderate voltage (25-35 uV) seen predominantly in posterior head regions, symmetric and reactive to eye opening and eye closing. EEG showed intermittent generalized 3 to 6 Hz theta-delta slowing. Hyperventilation and photic stimulation were not performed.   ABNORMALITY - Intermittent slow, generalized IMPRESSION: This study is suggestive of mild diffuse encephalopathy, nonspecific etiology. No seizures or epileptiform discharges were seen throughout the recording. Lora Havens    Cardiac Studies:   Echocardiogram 02/21/2021:     1. Left ventricular ejection fraction, by estimation, is 60 to 65%. The left ventricle has normal function. The left ventricle has no regional wall motion abnormalities. There is mild left ventricular hypertrophy. Left ventricular diastolic parameters  are consistent with Grade I diastolic dysfunction (impaired relaxation).  2. Right ventricular systolic function is normal. The right ventricular size is normal.  3. The mitral valve is degenerative. Trivial  mitral valve regurgitation. No evidence of mitral stenosis. Moderate mitral annular calcification.  4. Calcified AV with small gradient but AVA 2.3 cm2. The aortic valve is normal in structure. There is moderate calcification of the aortic valve. There is moderate thickening of the aortic valve. Aortic valve regurgitation is not visualized. Aortic  valve sclerosis/calcification is present, without any evidence of aortic stenosis.  5. The inferior vena cava is normal in size with greater than 50% respiratory variability, suggesting right atrial pressure of 3 mmHg.  EKG:  EKG 01/02/202 3: Sinus rhythm with first-degree AV block at the rate of 88 bpm, left axis deviation, left anterior fascicular block.  Right bundle branch block.  Trifascicular block.  Telemetry during cardiac arrest shows underlying sinus rhythm with ventricular  standstill.  Assessment   1.  Asystole leading to syncope and seizures 2.  Primary hypertension, patient not on any negative chronotropic agents 3.  Abnormal cardiac markers, probably due to demand ischemia and need for CPR, do not suspect ACS, no EKG abnormalities. 4.  Diabetes mellitus type 2 controlled 5.  Mild dementia 6.  Prior history of stroke  Recommendations:   I discussed the findings with the patient's daughter who has power of attorney for health, patient needs temporary pacemaker placement today as she has had several episodes of seizure disorder and I suspect this is related to ventricular standstill.  I will have EP evaluation for consideration of permanent pacemaker implantation.  I discussed with the patient's family regarding risk of ventricular perforation, pneumothorax, infection but not limited to these as major complications.  They are willing to proceed.  Further recommendations will follow.  Blood pressure is well controlled, diabetes being managed by primary team, patient still lives at home with her family and has mild dementia.  She has prior history of stroke and is presently on aspirin.  Continue the same.  Continue statins.  Critical care time provided: Total time spent with the patient and evaluation of charts, labs and also discussions regarding the procedure with the patient's family was 41 minutes.   [name], MD, [facility] 03/19/2021, 2:13 PM Office: [phone]

## 2021-02-23 NOTE — Transfer of Care (Addendum)
Immediate Anesthesia Transfer of Care Note  Patient: [name]  Procedure(s) Performed: AN AD HOC INTUBATION  Patient Location: Nursing Unit  Anesthesia Type: general  Level of Consciousness: sedated  Airway & Oxygen Therapy: Patient remains intubated per anesthesia plan and Patient placed on Ventilator (see vital sign flow sheet for setting)  Post-op Assessment: Report given to RN and Post -op Vital signs reviewed and stable  Post vital signs: Reviewed and stable  Last Vitals:  Vitals Value Taken Time  BP 141/124 03/01/2021 0957  Temp    Pulse 94 03/24/2021 0959  Resp 14 03/04/2021 0959  SpO2 97 % 03/01/2021 0959  Vitals shown include unvalidated device data.  Last Pain:  Vitals:   03/24/2021 0713  TempSrc: Temporal  PainSc: 0-No pain         Complications: No notable events documented.

## 2021-02-23 NOTE — Progress Notes (Signed)
Per Dr. Olalere/Smith EEG can be done now or in morning.  No longer holding off. Tech will reach out to nurse to arrange.

## 2021-02-23 NOTE — Consult Note (Addendum)
CARDIOLOGY CONSULT NOTE  Patient ID: [name] MRN: [id number] DOB/AGE: December 13, 1934 87 y.o.  Admit date: 01/22/2021 Referring Physician  Erskine Emery, MD Primary Physician:  Shon Baton, MD Reason for Consultation  Cardiac arrest  Patient ID: [name], female    DOB: 1934/06/11, 87 y.o.   MRN: [id number]  Chief Complaint  Patient presents with   Seizures   HPI:    [name]  is a 87 y.o. Caucasian female patient with hypertension, hyperlipidemia, diabetes mellitus, prior stroke, dementia, found to have seizure episode at home and altered mental status, was admitted to the hospital on 02/19/2021 with seizures and syncope.  This morning, telemetry called stating that she is bradycardic, and when the nurse walked in, patient was unresponsive and having seizure episode.  I was called to see the patient after she was resuscitated, intubated and was in the ICU by Dr. Erskine Emery.  History obtained by review of charts.  Patient is presently intubated.  Past Medical History:  Diagnosis Date   Abnormality of gait    Arthritis    Bifascicular bundle branch block    chronic   Degenerative spinal arthritis    cervical and lumbar   Dementia (HCC)    early onset   Dyspnea on exertion    GERD (gastroesophageal reflux disease)    History of CVA (cerebrovascular accident)    03-31-2012--  right cerebellar infart without hemorrhgia   History of gastric ulcer    2011  w/ bleed   History of kidney stones    History of squamous cell carcinoma excision    Hyperlipidemia    Hypertension    Hypothyroidism    Obstructive sleep apnea    has not been using -- but states has appointment soon to get refitted    Polymyalgia rheumatica ([place])    Stroke ([place])    Type 2 diabetes mellitus ([place])    Urge urinary incontinence    refactory   Past Surgical History:  Procedure Laterality Date   APPENDECTOMY  as child   HERNIA REPAIR  1956   INTERSTIM IMPLANT PLACEMENT N/A 11/19/2014    Procedure: INTERSTIM IMPLANT FIRST STAGE AND ;  Surgeon: Bjorn Loser, MD;  Location: [facility];  Service: Urology;  Laterality: N/A;   INTERSTIM IMPLANT PLACEMENT N/A 11/19/2014   Procedure: Barrie [place] IMPLANT SECOND STAGE;  Surgeon: Bjorn Loser, MD;  Location: [facility];  Service: Urology;  Laterality: N/A;   ORIF RIGHT WRIST FX  03-04-2004   SHOULDER ARTHROSCOPY / DEBRIDEMENT/  SAD/  DCR/  MINI OPEN ROTATOR CUFF REPAIR Right 01-09-2003   TEE WITHOUT CARDIOVERSION N/A 09/12/2012   Procedure: TRANSESOPHAGEAL ECHOCARDIOGRAM (TEE);  Surgeon: Lelon Perla, MD;  Location: [facility] ENDOSCOPY;  Service: Cardiovascular;  Laterality: N/A;  proximal septal thickening and chordal SAM creatin a narrow LVOT; turbulence noted   TONSILLECTOMY  as child   TOTAL KNEE ARTHROPLASTY Left 11-12-2004   TRANSTHORACIC ECHOCARDIOGRAM  09-10-2012   mild LVH, mild  focal basal hypertrophy of the septum,  ef 48-18%, grade I diastolic dysfunction/  trival AR /  MV with moderate systolic anterior motion of the chordal structures/  mild LAE   Social History   Tobacco Use   Smoking status: Former    Types: Cigarettes    Quit date: 11/02/2004    Years since quitting: 16.3   Smokeless tobacco: Never  Substance Use Topics   Alcohol use: No    Family History  Problem Relation  Age of Onset   Heart failure Mother    Heart attack Sister     Marital Status: Widowed  ROS  Review of Systems  Unable to perform ROS: Intubated  Objective   Vitals with BMI 02/22/2021 03/06/2021 02/22/2021  Height - - -  Weight - - -  BMI - - -  Systolic 553 748 79  Diastolic 97 74 67  Pulse 84 81 78    Blood pressure (!) 132/97, pulse 84, temperature 98.8 F (37.1 C), temperature source Oral, resp. rate 18, height _0  (1.626 m), weight 77.5 kg, SpO2 98 %.    Physical Exam Constitutional:      Appearance: She is obese.     Interventions: She is sedated and intubated.  HENT:     Mouth/Throat:      Mouth: Mucous membranes are moist.  Eyes:     Conjunctiva/sclera: Conjunctivae normal.  Neck:     Vascular: No carotid bruit.     Comments: Cannot make of JVD due to patient being intubated and has a central line on the left. Cardiovascular:     Rate and Rhythm: Normal rate and regular rhythm.     Pulses: Intact distal pulses.     Heart sounds: Normal heart sounds. No murmur heard.   No gallop.  Pulmonary:     Effort: Pulmonary effort is normal. She is intubated.     Breath sounds: Normal breath sounds.  Abdominal:     General: Bowel sounds are normal.     Palpations: Abdomen is soft.  Musculoskeletal:     Right lower leg: No edema.     Left lower leg: No edema.  Skin:    General: Skin is warm.     Capillary Refill: Capillary refill takes less than 2 seconds.   Laboratory examination:   Recent Labs    02/19/2021 1917 02/22/21 0106 03/04/2021 1138 03/09/2021 1208  NA 135 135 136 136  K 4.2 4.5 3.6 3.6  CL 100 104 101  --   CO2 _1 --   GLUCOSE 198* 112* 255*  --   BUN _2 --   CREATININE 1.14* 0.93 1.03*  --   CALCIUM 9.1 8.6* 8.5*  --   GFRNONAA 47* >60 54*  --    estimated creatinine clearance is 41 mL/min (A) (by C-G formula based on SCr of 1.03 mg/dL (H)).  CMP Latest Ref Rng & Units 03/16/2021 03/20/2021 02/22/2021  Glucose 70 - 99 mg/dL - 255(H) 112(H)  BUN 8 - 23 mg/dL - 14 15  Creatinine 0.44 - 1.00 mg/dL - 1.03(H) 0.93  Sodium 135 - 145 mmol/L 136 136 135  Potassium 3.5 - 5.1 mmol/L 3.6 3.6 4.5  Chloride 98 - 111 mmol/L - 101 104  CO2 22 - 32 mmol/L - 24 23  Calcium 8.9 - 10.3 mg/dL - 8.5(L) 8.6(L)  Total Protein 6.5 - 8.1 g/dL - - 6.3(L)  Total Bilirubin 0.3 - 1.2 mg/dL - - 0.9  Alkaline Phos 38 - 126 U/L - - 40  AST 15 - 41 U/L - - 19  ALT 0 - 44 U/L - - 9   CBC Latest Ref Rng & Units 03/12/2021 03/08/2021 02/22/2021  WBC 4.0 - 10.5 K/uL - 16.5(H) 8.3  Hemoglobin 12.0 - 15.0 g/dL 13.6 12.7 12.1  Hematocrit 36.0 - 46.0 % 40.0 38.4 37.3  Platelets 150  - 400 K/uL - 261 200   Lipid Panel Recent Labs  02/21/21 0500  CHOL 204*  TRIG 112  LDLCALC 140*  VLDL 22  HDL 42  CHOLHDL 4.9    HEMOGLOBIN A1C Lab Results  Component Value Date   HGBA1C 5.7 (H) 02/21/2021   MPG 116.89 02/21/2021   TSH Recent Labs    02/21/21 0630  TSH 1.430   BNP (last 3 results) No results for input(s): BNP in the last 8760 hours.  Cardiac Panel (last 3 results) Recent Labs    03/22/2021 1138 03/16/2021 1154  TROPONINIHS 223* 374*     Medications and allergies   Allergies  Allergen Reactions   Adhesive [Tape] Other (See Comments)    Irritation and pulls off skin   Levofloxacin     Other reaction(s): not effective   Omeprazole     Other reaction(s): headache     Current Meds  Medication Sig   acetaminophen (TYLENOL) 500 MG tablet Take 1,000 mg by mouth 2 (two) times daily.   cholecalciferol (VITAMIN D3) 25 MCG (1000 UNIT) tablet Take 1,000 Units by mouth daily.   donepezil (ARICEPT) 10 MG tablet Take 10 mg by mouth at bedtime.   DULoxetine (CYMBALTA) 30 MG capsule Take 30 mg by mouth at bedtime. Takes along with 60 mg to total 90 mg   DULoxetine (CYMBALTA) 60 MG capsule Take 90 mg by mouth at bedtime. Takes along with 30 mg to total 90 mg.   folic acid (FOLVITE) 1 MG tablet Take 1 tablet (1 mg total) by mouth 2 (two) times daily.   lamoTRIgine (LAMICTAL) 150 MG tablet Take 150 mg by mouth every morning.   levothyroxine (SYNTHROID, LEVOTHROID) 100 MCG tablet Take 1 tablet (100 mcg total) by mouth daily.   nitrofurantoin (MACRODANTIN) 100 MG capsule Take 100 mg by mouth daily. Continuous   OXYGEN Inhale 3 L into the lungs at bedtime.   QUEtiapine (SEROQUEL) 25 MG tablet Take 25 mg by mouth at bedtime.    Scheduled Meds:   stroke: mapping our early stages of recovery book   Does not apply Once   chlorhexidine gluconate (MEDLINE KIT)  15 mL Mouth Rinse BID   Chlorhexidine Gluconate Cloth  6 each Topical Daily   clopidogrel  75 mg Per  Tube Q breakfast   docusate  100 mg Per Tube BID   donepezil  10 mg Per Tube QHS   fentaNYL (SUBLIMAZE) injection  25 mcg Intravenous Once   insulin aspart  0-9 Units Subcutaneous Q4H   lamoTRIgine  150 mg Per Tube BID   levothyroxine  100 mcg Oral Daily   mouth rinse  15 mL Mouth Rinse 10 times per day   polyethylene glycol  17 g Per Tube Daily   QUEtiapine  25 mg Per Tube QHS   sodium chloride flush  3 mL Intravenous Q12H   Continuous Infusions:  sodium chloride 50 mL/hr at 02/22/2021 0157   cefTRIAXone (ROCEPHIN)  IV 2 g (03/09/2021 1315)   famotidine (PEPCID) IV 20 mg (03/21/2021 1245)   fentaNYL infusion INTRAVENOUS 100 mcg/hr (03/08/2021 1316)   norepinephrine (LEVOPHED) Adult infusion 9 mcg/min (03/24/2021 1140)   propofol (DIPRIVAN) infusion 40 mcg/kg/min (03/03/2021 1317)   PRN Meds:.acetaminophen **OR** acetaminophen (TYLENOL) oral liquid 160 mg/5 mL **OR** acetaminophen, fentaNYL, ondansetron (ZOFRAN) IV, senna-docusate   I/O last 3 completed shifts: In: 1265.4 [P.O.:60; I.V.:1205.4] Out: 300 [Urine:300] Total I/O In: -  Out: 300 [Urine:300]    Radiology:   DG Chest 1 View  Result Date: 02/26/2021 CLINICAL DATA:  Central line placement.  EXAM: CHEST  1 VIEW COMPARISON:  Same day. FINDINGS: Stable position of endotracheal and nasogastric tubes. Interval placement of left internal jugular catheter with distal tip in expected position of the SVC. Pneumothorax is noted. IMPRESSION: Interval placement of left internal jugular catheter with distal tip in expected position of the SVC. Electronically Signed   By: Marijo Conception M.D.   On: 03/12/2021 11:32   DG Abd 1 View  Result Date: 03/23/2021 CLINICAL DATA:  OG tube placement. EXAM: ABDOMEN - 1 VIEW COMPARISON:  03/31/2012 FINDINGS: An enteric tube is present with tip overlying the left upper quadrant in the expected region of the proximal gastric body and with the side hole also below the diaphragm. No dilated loops of bowel are seen  in the included upper portion of the abdomen. The lungs and mediastinum more better evaluated on today's earlier chest radiograph. IMPRESSION: Enteric tube placement as above. Electronically Signed   By: Logan Bores M.D.   On: 03/20/2021 13:36   DG Chest Port 1 View  Result Date: 02/26/2021 CLINICAL DATA:  Endotracheal tube and nasogastric tube placement. EXAM: PORTABLE CHEST 1 VIEW COMPARISON:  February 20, 2021 FINDINGS: The heart size and mediastinal contours are stable. Endotracheal tube is identified distal tip 5 cm from carina. Nasogastric tube is identified with distal tip in the proximal stomach. Mild increased pulmonary interstitium is identified in the bilateral upper lobes. There is no pleural effusion or focal pneumonia. There is no pneumothorax. The visualized skeletal structures are unremarkable. IMPRESSION: Endotracheal tube identified distal tip 5 cm from carina. Nasogastric tube distal tip in the proximal stomach. No pneumothorax. Mild increased pulmonary interstitium is identified in bilateral upper lobes. This can be seen in mild edema. Electronically Signed   By: Abelardo Diesel M.D.   On: 03/24/2021 10:32   EEG adult  Result Date: 02/22/2021 Lora Havens, MD     02/22/2021  7:55 AM Patient Name: [name] MRN: [id number] Epilepsy Attending: Lora Havens Referring Physician/Provider: Dr Kerney Elbe Date: 02/22/2021 Duration: 21.35 mins Patient history:  87 year old female presenting after a shaking spell at home with semiology suggestive of seizure. EEG to evaluate for seizure. Level of alertness: Awake AEDs during EEG study: LTG Technical aspects: This EEG study was done with scalp electrodes positioned according to the 10-20 International system of electrode placement. Electrical activity was acquired at a sampling rate of _0  and reviewed with a high frequency filter of _1  and a low frequency filter of _2 . EEG data were recorded continuously and digitally stored.  Description: The posterior dominant rhythm consists of 8 Hz activity of moderate voltage (25-35 uV) seen predominantly in posterior head regions, symmetric and reactive to eye opening and eye closing. EEG showed intermittent generalized 3 to 6 Hz theta-delta slowing. Hyperventilation and photic stimulation were not performed.   ABNORMALITY - Intermittent slow, generalized IMPRESSION: This study is suggestive of mild diffuse encephalopathy, nonspecific etiology. No seizures or epileptiform discharges were seen throughout the recording. Lora Havens    Cardiac Studies:   Echocardiogram 02/21/2021:     1. Left ventricular ejection fraction, by estimation, is 60 to 65%. The left ventricle has normal function. The left ventricle has no regional wall motion abnormalities. There is mild left ventricular hypertrophy. Left ventricular diastolic parameters  are consistent with Grade I diastolic dysfunction (impaired relaxation).  2. Right ventricular systolic function is normal. The right ventricular size is normal.  3. The mitral valve is degenerative. Trivial  mitral valve regurgitation. No evidence of mitral stenosis. Moderate mitral annular calcification.  4. Calcified AV with small gradient but AVA 2.3 cm2. The aortic valve is normal in structure. There is moderate calcification of the aortic valve. There is moderate thickening of the aortic valve. Aortic valve regurgitation is not visualized. Aortic  valve sclerosis/calcification is present, without any evidence of aortic stenosis.  5. The inferior vena cava is normal in size with greater than 50% respiratory variability, suggesting right atrial pressure of 3 mmHg.  EKG:  EKG 01/02/202 3: Sinus rhythm with first-degree AV block at the rate of 88 bpm, left axis deviation, left anterior fascicular block.  Right bundle branch block.  Trifascicular block.  Telemetry during cardiac arrest shows underlying sinus rhythm with ventricular  standstill.  Assessment   1.  Asystole leading to syncope and seizures 2.  Primary hypertension, patient not on any negative chronotropic agents 3.  Abnormal cardiac markers, probably due to demand ischemia and need for CPR, do not suspect ACS, no EKG abnormalities. 4.  Diabetes mellitus type 2 controlled 5.  Mild dementia 6.  Prior history of stroke  Recommendations:   I discussed the findings with the patient's daughter who has power of attorney for health, patient needs temporary pacemaker placement today as she has had several episodes of seizure disorder and I suspect this is related to ventricular standstill.  I will have EP evaluation for consideration of permanent pacemaker implantation.  I discussed with the patient's family regarding risk of ventricular perforation, pneumothorax, infection but not limited to these as major complications.  They are willing to proceed.  Further recommendations will follow.  Blood pressure is well controlled, diabetes being managed by primary team, patient still lives at home with her family and has mild dementia.  She has prior history of stroke and is presently on aspirin.  Continue the same.  Continue statins.  Critical care time provided: Total time spent with the patient and evaluation of charts, labs and also discussions regarding the procedure with the patient's family was 41 minutes.   [name], MD, [facility] 03/06/2021, 2:13 PM Office: [phone]

## 2021-02-23 NOTE — Progress Notes (Signed)
°   03/06/2021 1000  Clinical Encounter Type  Visited With Patient;Family  Visit Type Initial;Code;Critical Care  Referral From Nurse  Spiritual Encounters  Spiritual Needs Emotional;Prayer  Stress Factors  Family Stress Factors Loss of control;Health changes   CH responded to Code Blue on 5W; pt.'s son [name] sitting in chair in hallway; medical team attending to pt.  Son says pt. had a "seizure" this morning while he was in the room with her and that she had a similar episode last week which led to her admission to [facility].  Son told medical team pt. "wouldn't want to be kept alive on machines" but gave approval for team to stabilize pt. w/life support for the time being.  Dtr. Sonia Baller arrived shortly afterwards.  Pt. transferred to ICU for further support and treatment.  CH remained present w/[name] and Sonia Baller and accompanied them to 85M waiting area.  Family shared pt. lost her husband in a car accident approx. 77yrs ago; she currently lives w/[name] who is her primary caregiver; [name] and Sonia Baller share [employer] responsibilities.  Pt. has three granddaughters and additional family in [place].  Pt. and family are Catholic and family expressed desire to have a Catholic priest come and provide Anointing of the Sick if pt. is found to be at end of life.  At length Critical Care MD updated family, sharing that pt. has been stabilized and will be ready for family to be at bedside after additional IVs are placed.  [place] excused himself to allow Sonia Baller and [name] to make phone calls to update family but chaplains remain available for further support if needed.  11:15am - CH received Code STEMI page; cardiologist shared plan is to place temporary pacemaker.  Family has been updated by medical team; no immediate needs evident at this time.   Lindaann Pascal, Chaplain Pager: [phone]

## 2021-02-23 NOTE — Interval H&P Note (Signed)
History and Physical Interval Note:  03/08/2021 2:35 PM  [name]  has presented today for surgery, with the diagnosis of STEMI.  The various methods of treatment have been discussed with the patient and family. After consideration of risks, benefits and other options for treatment, the patient has consented to  Procedure(s): TEMPORARY PACEMAKER (N/A) as a surgical intervention.  The patient's history has been reviewed, patient examined, no change in status, stable for surgery.  I have reviewed the patient's chart and labs.  Questions were answered to the patient's satisfaction.     [name]

## 2021-02-23 NOTE — Progress Notes (Signed)
RT NOTES: Called to patient's room by Rapid response nurse. Code Blue called. CPR started. ROSC achieved. Pt intubated. Patient manually bagged on 100% fio2 to room 2M15 and placed on ventilator.

## 2021-02-23 NOTE — Anesthesia Procedure Notes (Signed)
Procedure Name: Intubation Date/Time: 03/11/2021 9:44 AM Performed by: Lorie Phenix, CRNA Pre-anesthesia Checklist: Patient identified, Emergency Drugs available, Suction available, Patient being monitored and Timeout performed Patient Re-evaluated:Patient Re-evaluated prior to induction Oxygen Delivery Method: Ambu bag Preoxygenation: Pre-oxygenation with 100% oxygen Induction Type: IV induction Ventilation: Mask ventilation without difficulty and Two handed mask ventilation required Laryngoscope Size: Glidescope and 3 Grade View: Grade I Tube type: Oral Tube size: 7.5 mm Number of attempts: 1 Airway Equipment and Method: Stylet and Video-laryngoscopy Placement Confirmation: ETT inserted through vocal cords under direct vision, breath sounds checked- equal and bilateral and CO2 detector Secured at: 22 cm Tube secured with: Tape Dental Injury: Teeth and Oropharynx as per pre-operative assessment

## 2021-02-24 ENCOUNTER — Encounter (HOSPITAL_COMMUNITY): Admission: EM | Disposition: E | Payer: Self-pay | Source: Home / Self Care | Attending: Cardiology

## 2021-02-24 ENCOUNTER — Encounter (HOSPITAL_COMMUNITY): Payer: Self-pay | Admitting: Cardiology

## 2021-02-24 ENCOUNTER — Inpatient Hospital Stay (HOSPITAL_COMMUNITY): Payer: Medicare Other

## 2021-02-24 DIAGNOSIS — I469 Cardiac arrest, cause unspecified: Secondary | ICD-10-CM

## 2021-02-24 DIAGNOSIS — I442 Atrioventricular block, complete: Secondary | ICD-10-CM

## 2021-02-24 DIAGNOSIS — I459 Conduction disorder, unspecified: Secondary | ICD-10-CM

## 2021-02-24 DIAGNOSIS — R55 Syncope and collapse: Secondary | ICD-10-CM | POA: Diagnosis not present

## 2021-02-24 DIAGNOSIS — R569 Unspecified convulsions: Secondary | ICD-10-CM | POA: Diagnosis not present

## 2021-02-24 LAB — CBC
HCT: 37.7 % (ref 36.0–46.0)
Hemoglobin: 12.6 g/dL (ref 12.0–15.0)
MCH: 33.5 pg (ref 26.0–34.0)
MCHC: 33.4 g/dL (ref 30.0–36.0)
MCV: 100.3 fL — ABNORMAL HIGH (ref 80.0–100.0)
Platelets: 245 10*3/uL (ref 150–400)
RBC: 3.76 MIL/uL — ABNORMAL LOW (ref 3.87–5.11)
RDW: 12.8 % (ref 11.5–15.5)
WBC: 11.1 10*3/uL — ABNORMAL HIGH (ref 4.0–10.5)
nRBC: 0.2 % (ref 0.0–0.2)

## 2021-02-24 LAB — COMPREHENSIVE METABOLIC PANEL
ALT: 28 U/L (ref 0–44)
AST: 31 U/L (ref 15–41)
Albumin: 3.2 g/dL — ABNORMAL LOW (ref 3.5–5.0)
Alkaline Phosphatase: 50 U/L (ref 38–126)
Anion gap: 10 (ref 5–15)
BUN: 12 mg/dL (ref 8–23)
CO2: 22 mmol/L (ref 22–32)
Calcium: 8.6 mg/dL — ABNORMAL LOW (ref 8.9–10.3)
Chloride: 103 mmol/L (ref 98–111)
Creatinine, Ser: 1.16 mg/dL — ABNORMAL HIGH (ref 0.44–1.00)
GFR, Estimated: 46 mL/min — ABNORMAL LOW (ref >60)
Glucose, Bld: 161 mg/dL — ABNORMAL HIGH (ref 70–99)
Potassium: 3.7 mmol/L (ref 3.5–5.1)
Sodium: 135 mmol/L (ref 135–145)
Total Bilirubin: 1.1 mg/dL (ref 0.3–1.2)
Total Protein: 6.1 g/dL — ABNORMAL LOW (ref 6.5–8.1)

## 2021-02-24 LAB — GLUCOSE, CAPILLARY
Glucose-Capillary: 119 mg/dL — ABNORMAL HIGH (ref 70–99)
Glucose-Capillary: 125 mg/dL — ABNORMAL HIGH (ref 70–99)
Glucose-Capillary: 163 mg/dL — ABNORMAL HIGH (ref 70–99)
Glucose-Capillary: 154 mg/dL — ABNORMAL HIGH (ref 70–99)
Glucose-Capillary: 143 mg/dL — ABNORMAL HIGH (ref 70–99)
Glucose-Capillary: 136 mg/dL — ABNORMAL HIGH (ref 70–99)

## 2021-02-24 LAB — TRIGLYCERIDES: Triglycerides: 127 mg/dL (ref <150)

## 2021-02-24 LAB — MAGNESIUM: Magnesium: 1.6 mg/dL — ABNORMAL LOW (ref 1.7–2.4)

## 2021-02-24 SURGERY — PACEMAKER IMPLANT

## 2021-02-24 MED ORDER — DEXMEDETOMIDINE HCL IN NACL 400 MCG/100ML IV SOLN
0.4000 ug/kg/h | INTRAVENOUS | Status: DC
  Administered 2021-02-24: 0.4 ug/kg/h via INTRAVENOUS
  Administered 2021-02-24: 0.7 ug/kg/h via INTRAVENOUS
  Administered 2021-02-25: 0.9 ug/kg/h via INTRAVENOUS
  Administered 2021-02-25: 0.6 ug/kg/h via INTRAVENOUS
  Administered 2021-02-25: 1.2 ug/kg/h via INTRAVENOUS
  Administered 2021-02-25 (×2): 0.8 ug/kg/h via INTRAVENOUS
  Administered 2021-02-26: 0.7 ug/kg/h via INTRAVENOUS
  Administered 2021-02-26: 1 ug/kg/h via INTRAVENOUS
  Administered 2021-02-27: 0.5 ug/kg/h via INTRAVENOUS
  Filled 2021-02-24 (×10): qty 100

## 2021-02-24 MED ORDER — DILTIAZEM HCL-DEXTROSE 125-5 MG/125ML-% IV SOLN (PREMIX)
5.0000 mg/h | INTRAVENOUS | Status: DC
  Filled 2021-02-24: qty 125

## 2021-02-24 MED ORDER — LEVOTHYROXINE SODIUM 100 MCG PO TABS
100.0000 ug | ORAL_TABLET | Freq: Every day | ORAL | Status: DC
  Administered 2021-02-25 – 2021-02-27 (×3): 100 ug
  Filled 2021-02-24 (×3): qty 1

## 2021-02-24 MED ORDER — ADENOSINE 6 MG/2ML IV SOLN
12.0000 mg | Freq: Once | INTRAVENOUS | Status: AC
  Administered 2021-02-24: 12 mg via INTRAVENOUS

## 2021-02-24 MED ORDER — MAGNESIUM SULFATE 2 GM/50ML IV SOLN
2.0000 g | Freq: Once | INTRAVENOUS | Status: AC
  Administered 2021-02-24: 2 g via INTRAVENOUS
  Filled 2021-02-24: qty 50

## 2021-02-24 MED ORDER — POTASSIUM CHLORIDE 20 MEQ PO PACK
40.0000 meq | PACK | Freq: Once | ORAL | Status: AC
  Administered 2021-02-24: 40 meq
  Filled 2021-02-24: qty 2

## 2021-02-24 MED ORDER — ADENOSINE 6 MG/2ML IV SOLN
INTRAVENOUS | Status: AC
  Administered 2021-02-24: 6 mg
  Filled 2021-02-24: qty 2

## 2021-02-24 MED ORDER — ADENOSINE 6 MG/2ML IV SOLN
INTRAVENOUS | Status: AC
  Filled 2021-02-24: qty 4

## 2021-02-24 NOTE — Progress Notes (Signed)
Subjective:  Patient seen and examined at bedside at approximately 8:00 a.m. today.  Electrophysiology was also at bedside, tentative plan to continue supportive measures and reevaluate permanent pacemaker implantation if she is able to come off ventilator and pressors. Patient's son and daughter are present at bedside. Patient is intubated and sedated.  Blood pressure remains soft.  Intake/Output from previous day:  I/O last 3 completed shifts: In: 2743.8 [I.V.:2593.8; IV Piggyback:150] Out: 600 [Urine:600] Total I/O In: 250.9 [I.V.:250.9] Out: -   Blood pressure 95/76, pulse 72, temperature 98.5 F (36.9 C), resp. rate (!) 22, height $RemoveBe'5\' 4"'OmWBFVkAZ$  (1.626 m), weight 81.9 kg, SpO2 (!) 81 %. Physical Exam Vitals reviewed.  Constitutional:      Interventions: She is intubated (droawsy with sedation).     Comments: ETT in place  HENT:     Head: Normocephalic and atraumatic.  Cardiovascular:     Rate and Rhythm: Normal rate and regular rhythm.     Pulses: Intact distal pulses.     Heart sounds: S1 normal and S2 normal. No murmur heard.   No gallop.  Pulmonary:     Effort: Pulmonary effort is normal. She is intubated (droawsy with sedation).     Comments: Decreased breath sounds bilaterally Musculoskeletal:     Right lower leg: No edema.     Left lower leg: No edema.   Lab Results: BMP BNP (last 3 results) No results for input(s): BNP in the last 8760 hours.  ProBNP (last 3 results) No results for input(s): PROBNP in the last 8760 hours. BMP Latest Ref Rng & Units 02/26/2021 03/13/2021 02/22/2021  Glucose 70 - 99 mg/dL - 255(H) 112(H)  BUN 8 - 23 mg/dL - 14 15  Creatinine 0.44 - 1.00 mg/dL - 1.03(H) 0.93  Sodium 135 - 145 mmol/L 136 136 135  Potassium 3.5 - 5.1 mmol/L 3.6 3.6 4.5  Chloride 98 - 111 mmol/L - 101 104  CO2 22 - 32 mmol/L - 24 23  Calcium 8.9 - 10.3 mg/dL - 8.5(L) 8.6(L)   Hepatic Function Latest Ref Rng & Units 02/22/2021 02/12/2021 07/07/2019  Total Protein 6.5 - 8.1  g/dL 6.3(L) 7.1 7.9  Albumin 3.5 - 5.0 g/dL 3.3(L) 3.6 4.3  AST 15 - 41 U/L $Remo'19 15 16  'tnnxd$ ALT 0 - 44 U/L $Remo'9 12 14  'BoYIZ$ Alk Phosphatase 38 - 126 U/L 40 54 47  Total Bilirubin 0.3 - 1.2 mg/dL 0.9 0.5 1.0   CBC Latest Ref Rng & Units 03/18/2021 03/22/2021 02/22/2021  WBC 4.0 - 10.5 K/uL - 16.5(H) 8.3  Hemoglobin 12.0 - 15.0 g/dL 13.6 12.7 12.1  Hematocrit 36.0 - 46.0 % 40.0 38.4 37.3  Platelets 150 - 400 K/uL - 261 200   Lipid Panel     Component Value Date/Time   CHOL 204 (H) 02/21/2021 0500   TRIG 127 03/16/2021 0553   HDL 42 02/21/2021 0500   CHOLHDL 4.9 02/21/2021 0500   VLDL 22 02/21/2021 0500   LDLCALC 140 (H) 02/21/2021 0500   Cardiac Panel (last 3 results) No results for input(s): CKTOTAL, CKMB, TROPONINI, RELINDX in the last 72 hours.  HEMOGLOBIN A1C Lab Results  Component Value Date   HGBA1C 5.7 (H) 02/21/2021   MPG 116.89 02/21/2021   TSH Recent Labs    02/21/21 0630  TSH 1.430   Imaging: DG Chest 1 View  Result Date: 03/13/2021 CLINICAL DATA:  Central line placement. EXAM: CHEST  1 VIEW COMPARISON:  Same day. FINDINGS: Stable position of endotracheal and nasogastric  tubes. Interval placement of left internal jugular catheter with distal tip in expected position of the SVC. Pneumothorax is noted. IMPRESSION: Interval placement of left internal jugular catheter with distal tip in expected position of the SVC. Electronically Signed   By: Marijo Conception M.D.   On: 03/08/2021 11:32   DG Abd 1 View  Result Date: 02/22/2021 CLINICAL DATA:  OG tube placement. EXAM: ABDOMEN - 1 VIEW COMPARISON:  03/31/2012 FINDINGS: An enteric tube is present with tip overlying the left upper quadrant in the expected region of the proximal gastric body and with the side hole also below the diaphragm. No dilated loops of bowel are seen in the included upper portion of the abdomen. The lungs and mediastinum more better evaluated on today's earlier chest radiograph. IMPRESSION: Enteric tube placement as  above. Electronically Signed   By: Logan Bores M.D.   On: 03/19/2021 13:36   CARDIAC CATHETERIZATION  Result Date: 02/24/2021 Procedure performed: Ultrasound-guided access of the right common femoral vein, placement of a 6 French long sheath followed by placement of a balloontipped temporary transvenous pacemaker under fluoroscopy guidance. No procedural complications.  Ventricular capture obtained at 0.5 MV.  Catheter tip at 58 cm, settings included demand 50 bpm at output of 5 MV. Access site and pacemaker were carefully sutured in place and secured.  Discussed with family.   DG Chest Port 1 View  Result Date: 03/24/2021 CLINICAL DATA:  Endotracheal tube and nasogastric tube placement. EXAM: PORTABLE CHEST 1 VIEW COMPARISON:  February 20, 2021 FINDINGS: The heart size and mediastinal contours are stable. Endotracheal tube is identified distal tip 5 cm from carina. Nasogastric tube is identified with distal tip in the proximal stomach. Mild increased pulmonary interstitium is identified in the bilateral upper lobes. There is no pleural effusion or focal pneumonia. There is no pneumothorax. The visualized skeletal structures are unremarkable. IMPRESSION: Endotracheal tube identified distal tip 5 cm from carina. Nasogastric tube distal tip in the proximal stomach. No pneumothorax. Mild increased pulmonary interstitium is identified in bilateral upper lobes. This can be seen in mild edema. Electronically Signed   By: Abelardo Diesel M.D.   On: 03/01/2021 10:32    Cardiac Studies: Echocardiogram 02/21/2021:     1. Left ventricular ejection fraction, by estimation, is 60 to 65%. The left ventricle has normal function. The left ventricle has no regional wall motion abnormalities. There is mild left ventricular hypertrophy. Left ventricular diastolic parameters  are consistent with Grade I diastolic dysfunction (impaired relaxation).  2. Right ventricular systolic function is normal. The right ventricular size  is normal.  3. The mitral valve is degenerative. Trivial mitral valve regurgitation. No evidence of mitral stenosis. Moderate mitral annular calcification.  4. Calcified AV with small gradient but AVA 2.3 cm2. The aortic valve is normal in structure. There is moderate calcification of the aortic valve. There is moderate thickening of the aortic valve. Aortic valve regurgitation is not visualized. Aortic  valve sclerosis/calcification is present, without any evidence of aortic stenosis.  5. The inferior vena cava is normal in size with greater than 50% respiratory variability, suggesting right atrial pressure of 3 mmHg.  Temporary Pacemaker placement 03/01/2021: Procedure performed: Ultrasound-guided access of the right common femoral vein, placement of a 6 French long sheath followed by placement of a balloontipped temporary transvenous pacemaker under fluoroscopy guidance. No procedural complications.  Ventricular capture obtained at 0.5 MV.  Catheter tip at 58 cm, settings included demand 50 bpm at output of 5 MV.  Access site and pacemaker were carefully sutured in place and secured.  Discussed with family.    EKG: 01/02/202 3: Sinus rhythm with first-degree AV block at the rate of 88 bpm, left axis deviation, left anterior fascicular block.  Right bundle branch block.  Trifascicular block.   Telemetry:  Sinus rhythm with 1st degree AV block, 70s bpm.  During cardiac arrest shows underlying sinus rhythm with ventricular standstill.  Scheduled Meds:   stroke: mapping our early stages of recovery book   Does not apply Once   chlorhexidine gluconate (MEDLINE KIT)  15 mL Mouth Rinse BID   Chlorhexidine Gluconate Cloth  6 each Topical Daily   clopidogrel  75 mg Per Tube Q breakfast   docusate  100 mg Per Tube BID   donepezil  10 mg Per Tube QHS   fentaNYL (SUBLIMAZE) injection  25 mcg Intravenous Once   insulin aspart  0-9 Units Subcutaneous Q4H   lamoTRIgine  150 mg Per Tube BID   levothyroxine   100 mcg Oral Daily   mouth rinse  15 mL Mouth Rinse 10 times per day   polyethylene glycol  17 g Per Tube Daily   QUEtiapine  25 mg Per Tube QHS   sodium chloride flush  3 mL Intravenous Q12H   Continuous Infusions:  sodium chloride 50 mL/hr at 03/17/2021 0900   cefTRIAXone (ROCEPHIN)  IV Stopped (03/04/2021 1345)   famotidine (PEPCID) IV 20 mg (02/28/2021 0929)   fentaNYL infusion INTRAVENOUS 125 mcg/hr (02/23/2021 0900)   norepinephrine (LEVOPHED) Adult infusion 5 mcg/min (03/10/2021 0900)   propofol (DIPRIVAN) infusion Stopped (03/16/2021 0842)   PRN Meds:.acetaminophen **OR** acetaminophen (TYLENOL) oral liquid 160 mg/5 mL **OR** acetaminophen, fentaNYL, ondansetron (ZOFRAN) IV, senna-docusate  Assessment/Plan:  Appreciate recommendations of critical care team.  Plan is to attempt to wean patient off pressors and ventilator as able.  We will also continue EEG monitoring.  Presently being treated with ceftriaxone given UTI.  Recommend continued supportive care.  Discussed patient with electrophysiology team who will hold off on permanent pacemaker implantation at this time.  We will reconsider pacemaker implant if patient is able to come off ventilator and pressors.  Patient with SVT requiring administration of adenosine per Dr. Einar Gip this afternoon. Discussed with Dr. Einar Gip who recommends starting diltiazem infusion for rate control now that she has temporary pacemaker functioning well. Patient's blood pressure is soft, will monitor closely.   Patient was seen in collaboration with Dr. Einar Gip. He also reviewed patient's chart and examined the patient. Dr. Einar Gip is in agreement of the plan.    Alethia Berthold, PA-C 03/21/2021, 9:41 AM Office: [phone]

## 2021-02-24 NOTE — Progress Notes (Signed)
EEG done at bedside. No skin breakdown noted. Extra 10 minutes recording time allotted due to misplacement of one lead. Results pending.

## 2021-02-24 NOTE — Progress Notes (Signed)
Called to bedside for low BP with HR in the 130 range . BP self resolved with repositioning patient . Daughter and Son both discussed their desire for no further CPR or defibrillation if patient were to decompensate . They do want to continue current level of care, but No CPR or defibrillation , no ACLS medications .  I will place the order  for No CPR/ Defib or ACLS medications .  Current level of care is to remain the same.   Both myself and Nursing were in the room heard the verbalized wishes of the family.   Magdalen Spatz, MSN, AGACNP-BC [place] See Amion for personal pager PCCM on call pager [phone]

## 2021-02-24 NOTE — Consult Note (Addendum)
Cardiology Consultation:   Patient ID: [name] MRN: [id number]; DOB: [birth year]  Admit date: 02/09/2021 Date of Consult: 03/10/2021  PCP:  Shon Baton, MD   [facility] Providers Cardiologist:  Peter Martinique, MD        Patient Profile:   [name] is a 87 y.o. female with a hx of HTN, hypothyroidism, HLD, stroke, dementia (advancing) who is being seen 03/19/2021 for the evaluation of CHB, ventricular standstill at the request of Dr. Einar Gip.  History of Present Illness:   [name] was admitted to [facility] 02/15/2021 with episode/event that was observed by her family and appeared to be seizure like to them.  She was evaluated found with UTI, unable to do MRI 2/2 spine stimulator, neurology felt perhaps infection provoked seizure, with no recurrent events, planned for d/c to SNF. Yesterday AM she had a few very long episodes of V standstill/CHB and emergent temp pacing wire was placed. She required intubation and remains on pressor support  EP is asked to see with no nodal blocking agents at home to consider PPM  LABS (yesterday) K+ 3.6 Mag 1.6 BUN/Creat 14/1.03 HS Trop 223, 372 WBC 16.6 H/H 13/40 Plts 261  TSH 1.430  Urine cx + for Klebsiella Pneumoniae Blood cultures drawn 01/23/21 are pending (on Rocephin)  Son and daughter are at bedside Patient lives with her son, he is her caregiver They describe her as having advancing dementia, can carry on a conversations, sundowners is getting significant, requires help with most ADLs They state that they and the patient would not want to be maintained on machines, though would like her to have the opportunity to recover, if able to get off vent/off pressors would want to proceed with PPM     Past Medical History:  Diagnosis Date   Abnormality of gait    Arthritis    Bifascicular bundle branch block    chronic   Degenerative spinal arthritis    cervical and lumbar   Dementia ([place])    early onset   Dyspnea on  exertion    GERD (gastroesophageal reflux disease)    History of CVA (cerebrovascular accident)    03-31-2012--  right cerebellar infart without hemorrhgia   History of gastric ulcer    2011  w/ bleed   History of kidney stones    History of squamous cell carcinoma excision    Hyperlipidemia    Hypertension    Hypothyroidism    Obstructive sleep apnea    has not been using -- but states has appointment soon to get refitted    Polymyalgia rheumatica ([place])    Stroke ([place])    Type 2 diabetes mellitus ([place])    Urge urinary incontinence    refactory    Past Surgical History:  Procedure Laterality Date   APPENDECTOMY  as child   HERNIA REPAIR  1956   INTERSTIM IMPLANT PLACEMENT N/A 11/19/2014   Procedure: INTERSTIM IMPLANT FIRST STAGE AND ;  Surgeon: Bjorn Loser, MD;  Location: [place];  Service: Urology;  Laterality: N/A;   INTERSTIM IMPLANT PLACEMENT N/A 11/19/2014   Procedure: Barrie Lyme IMPLANT SECOND STAGE;  Surgeon: Bjorn Loser, MD;  Location: [facility];  Service: Urology;  Laterality: N/A;   ORIF RIGHT WRIST FX  03-04-2004   SHOULDER ARTHROSCOPY / DEBRIDEMENT/  SAD/  DCR/  MINI OPEN ROTATOR CUFF REPAIR Right 01-09-2003   TEE WITHOUT CARDIOVERSION N/A 09/12/2012   Procedure: TRANSESOPHAGEAL ECHOCARDIOGRAM (TEE);  Surgeon: Lelon Perla,  MD;  Location: [place];  Service: Cardiovascular;  Laterality: N/A;  proximal septal thickening and chordal SAM creatin a narrow LVOT; turbulence noted   TEMPORARY PACEMAKER N/A 03/07/2021   Procedure: TEMPORARY PACEMAKER;  Surgeon: [name], MD;  Location: [place] [facility];  Service: Cardiovascular;  Laterality: N/A;   TONSILLECTOMY  as child   TOTAL KNEE ARTHROPLASTY Left 11-12-2004   TRANSTHORACIC ECHOCARDIOGRAM  09-10-2012   mild LVH, mild  focal basal hypertrophy of the septum,  ef 62-70%, grade I diastolic dysfunction/  trival AR /  MV with moderate systolic anterior motion of the chordal  structures/  mild LAE     Home Medications:  Prior to Admission medications   Medication Sig Start Date End Date Taking? Authorizing Provider  acetaminophen (TYLENOL) 500 MG tablet Take 1,000 mg by mouth 2 (two) times daily.   Yes [provider]  cholecalciferol (VITAMIN D3) 25 MCG (1000 UNIT) tablet Take 1,000 Units by mouth daily.   Yes [provider]  donepezil (ARICEPT) 10 MG tablet Take 10 mg by mouth at bedtime.   Yes [provider]  DULoxetine (CYMBALTA) 30 MG capsule Take 30 mg by mouth at bedtime. Takes along with 60 mg to total 90 mg   Yes [provider]  DULoxetine (CYMBALTA) 60 MG capsule Take 90 mg by mouth at bedtime. Takes along with 30 mg to total 90 mg.   Yes [provider]  folic acid (FOLVITE) 1 MG tablet Take 1 tablet (1 mg total) by mouth 2 (two) times daily. 04/10/12  Yes Angiulli, Lavon Paganini, PA-C  lamoTRIgine (LAMICTAL) 150 MG tablet Take 150 mg by mouth every morning.   Yes [provider]  levothyroxine (SYNTHROID, LEVOTHROID) 100 MCG tablet Take 1 tablet (100 mcg total) by mouth daily. 04/10/12  Yes Angiulli, Lavon Paganini, PA-C  nitrofurantoin (MACRODANTIN) 100 MG capsule Take 100 mg by mouth daily. Continuous   Yes [provider]  OXYGEN Inhale 3 L into the lungs at bedtime.   Yes [provider]  QUEtiapine (SEROQUEL) 25 MG tablet Take 25 mg by mouth at bedtime.   Yes [provider]    Inpatient Medications: Scheduled Meds:   stroke: mapping our early stages of recovery book   Does not apply Once   chlorhexidine gluconate (MEDLINE KIT)  15 mL Mouth Rinse BID   Chlorhexidine Gluconate Cloth  6 each Topical Daily   clopidogrel  75 mg Per Tube Q breakfast   docusate  100 mg Per Tube BID   donepezil  10 mg Per Tube QHS   fentaNYL (SUBLIMAZE) injection  25 mcg Intravenous Once   insulin aspart  0-9 Units Subcutaneous Q4H   lamoTRIgine  150 mg Per Tube BID   levothyroxine  100 mcg Oral  Daily   mouth rinse  15 mL Mouth Rinse 10 times per day   polyethylene glycol  17 g Per Tube Daily   QUEtiapine  25 mg Per Tube QHS   sodium chloride flush  3 mL Intravenous Q12H   Continuous Infusions:  sodium chloride 50 mL/hr at 02/26/2021 0800   cefTRIAXone (ROCEPHIN)  IV Stopped (03/19/2021 1345)   famotidine (PEPCID) IV Stopped (03/10/2021 1315)   fentaNYL infusion INTRAVENOUS 125 mcg/hr (03/06/2021 0800)   norepinephrine (LEVOPHED) Adult infusion 4 mcg/min (02/26/2021 0800)   propofol (DIPRIVAN) infusion 10 mcg/kg/min (03/01/2021 0800)   PRN Meds: acetaminophen **OR** acetaminophen (TYLENOL) oral liquid 160 mg/5 mL **OR** acetaminophen, fentaNYL, ondansetron (ZOFRAN) IV, senna-docusate  Allergies:    Allergies  Allergen Reactions   Adhesive [Tape] Other (See Comments)    Irritation and pulls off skin   Levofloxacin     Other reaction(s): not effective   Omeprazole     Other reaction(s): headache    Social History:   Social History   Socioeconomic History   Marital status: Widowed    Spouse name: Not on file   Number of children: 2   Years of education: Not on file   Highest education level: Not on file  Occupational History   Not on file  Tobacco Use   Smoking status: Former    Types: Cigarettes    Quit date: 11/02/2004    Years since quitting: 16.3   Smokeless tobacco: Never  Vaping Use   Vaping Use: Never used  Substance and Sexual Activity   Alcohol use: No   Drug use: No   Sexual activity: Never  Other Topics Concern   Not on file  Social History Narrative   Not on file   Social Determinants of Health   Financial Resource Strain: High Risk   Difficulty of Paying Living Expenses: Hard  Food Insecurity: Food Insecurity Present   Worried About [place] in the Last Year: Sometimes true   Ran Out of Food in the Last Year: Sometimes true  Transportation Needs: No Transportation Needs   Lack of Transportation (Medical): No   Lack of Transportation  (Non-Medical): No  Physical Activity: Not on file  Stress: Not on file  Social Connections: Not on file  Intimate Partner Violence: Not on file    Family History:   Family History  Problem Relation Age of Onset   Heart failure Mother    Heart attack Sister      ROS:  Please see the history of present illness.  All other ROS reviewed and negative.     Physical Exam/Data:   Vitals:   02/26/2021 0600 03/05/2021 0700 03/18/2021 0745 03/12/2021 0800  BP: 99/76  99/70 (!) 84/33  Pulse:      Resp: (!) 22  (!) 22 (!) 22  Temp:  98.5 F (36.9 C)    TempSrc:      SpO2: (!) 88%  98% 99%  Weight:      Height:        Intake/Output Summary (Last 24 hours) at 03/03/2021 0814 Last data filed at 03/17/2021 0800 Gross per 24 hour  Intake 2460.59 ml  Output --  Net 2460.59 ml   Last 3 Weights 03/05/2021 03/11/2021 02/19/2021  Weight (lbs) 180 lb 8.9 oz 170 lb 13.7 oz 160 lb  Weight (kg) 81.9 kg 77.5 kg 72.576 kg     Body mass index is 30.99 kg/m.  General:  Well nourished, intubated/sedated HEENT: normal Neck: no JVD Vascular: No carotid bruits;  Cardiac:  RRR; no murmurs, gallops or rubs Lungs:  CTA b/l, no wheezing, rhonchi or rales  Abd: soft  Ext: no edema Musculoskeletal:  No deformities Skin: warm and dry  Neuro:  unable to assess, RN reports follows commands on reduced sedation Psych:  unable to assess  EKG:  The EKG was personally reviewed and demonstrates:   SR, 88bpm, 1st degree AVlock 275ms, RBBB, LAD Telemetry:  Telemetry was personally reviewed and demonstrates:   SR, long 1st degree AVblock, maintaining 1:1 conduction 70's (Temp pacer back up 50)  Relevant CV Studies:  02/21/21: TTE IMPRESSIONS   1. Left ventricular ejection fraction, by estimation, is 60 to  65%. The  left ventricle has normal function. The left ventricle has no regional  wall motion abnormalities. There is mild left ventricular hypertrophy.  Left ventricular diastolic parameters  are consistent  with Grade I diastolic dysfunction (impaired relaxation).   2. Right ventricular systolic function is normal. The right ventricular  size is normal.   3. The mitral valve is degenerative. Trivial mitral valve regurgitation.  No evidence of mitral stenosis. Moderate mitral annular calcification.   4. Calcified AV with small gradient but AVA 2.3 cm2. The aortic valve is  normal in structure. There is moderate calcification of the aortic valve.  There is moderate thickening of the aortic valve. Aortic valve  regurgitation is not visualized. Aortic  valve sclerosis/calcification is present, without any evidence of aortic  stenosis.   5. The inferior vena cava is normal in size with greater than 50%  respiratory variability, suggesting right atrial pressure of 3 mmHg.   Laboratory Data:  High Sensitivity Troponin:   Recent Labs  Lab 03/24/2021 1138 03/04/2021 1154  TROPONINIHS 223* 374*     Chemistry Recent Labs  Lab 02/12/2021 1917 02/22/21 0106 03/21/2021 1138 02/25/2021 1208  NA 135 135 136 136  K 4.2 4.5 3.6 3.6  CL 100 104 101  --   CO2 $Re'28 23 24  'yTb$ --   GLUCOSE 198* 112* 255*  --   BUN $Re'20 15 14  'iSN$ --   CREATININE 1.14* 0.93 1.03*  --   CALCIUM 9.1 8.6* 8.5*  --   MG  --   --  1.6*  --   GFRNONAA 47* >60 54*  --   ANIONGAP $RemoveB'7 8 11  'wUThNvqv$ --     Recent Labs  Lab 02/08/2021 1917 02/22/21 0106  PROT 7.1 6.3*  ALBUMIN 3.6 3.3*  AST 15 19  ALT 12 9  ALKPHOS 54 40  BILITOT 0.5 0.9   Lipids  Recent Labs  Lab 02/21/21 0500 02/24/21 0553  CHOL 204*  --   TRIG 112 127  HDL 42  --   LDLCALC 140*  --   CHOLHDL 4.9  --     Hematology Recent Labs  Lab 02/12/2021 1917 02/22/21 0106 02/28/2021 1138 03/04/2021 1208  WBC 8.3 8.3 16.5*  --   RBC 4.00 3.71* 3.79*  --   HGB 13.0 12.1 12.7 13.6  HCT 40.9 37.3 38.4 40.0  MCV 102.3* 100.5* 101.3*  --   MCH 32.5 32.6 33.5  --   MCHC 31.8 32.4 33.1  --   RDW 12.6 12.6 12.4  --   PLT 250 200 261  --    Thyroid  Recent Labs  Lab  02/21/21 0630  TSH 1.430    BNPNo results for input(s): BNP, PROBNP in the last 168 hours.  DDimer No results for input(s): DDIMER in the last 168 hours.   Radiology/Studies:  DG Chest 1 View Result Date: 02/22/2021 CLINICAL DATA:  Central line placement. EXAM: CHEST  1 VIEW COMPARISON:  Same day. FINDINGS: Stable position of endotracheal and nasogastric tubes. Interval placement of left internal jugular catheter with distal tip in expected position of the SVC. Pneumothorax is noted. IMPRESSION: Interval placement of left internal jugular catheter with distal tip in expected position of the SVC. Electronically Signed   By: Marijo Conception M.D.   On: 03/02/2021 11:32   DG Abd 1 View Result Date: 03/19/2021 CLINICAL DATA:  OG tube placement. EXAM: ABDOMEN - 1 VIEW COMPARISON:  03/31/2012 FINDINGS: An enteric tube is present  with tip overlying the left upper quadrant in the expected region of the proximal gastric body and with the side hole also below the diaphragm. No dilated loops of bowel are seen in the included upper portion of the abdomen. The lungs and mediastinum more better evaluated on today's earlier chest radiograph. IMPRESSION: Enteric tube placement as above. Electronically Signed   By: Logan Bores M.D.   On: 03/06/2021 13:36   CT Head Wo Contrast Result Date: 02/10/2021 CLINICAL DATA:  Mental status change of unknown cause.  Seizures. EXAM: CT HEAD WITHOUT CONTRAST TECHNIQUE: Contiguous axial images were obtained from the base of the skull through the vertex without intravenous contrast. COMPARISON:  11/29/2014 FINDINGS: Brain: Prominent diffuse cerebral atrophy. Ventricular dilatation consistent with central atrophy. Low-attenuation changes in the deep white matter consistent with small vessel ischemia. Old right cerebellar infarcts. Old lacunar infarcts in the left internal capsule, progressed since prior study. No mass effect or midline shift. No abnormal extra-axial fluid collections.  Gray-white matter junctions are distinct. Basal cisterns are not effaced. No acute intracranial hemorrhage. Vascular: Moderate intracranial arterial vascular calcifications. Skull: Calvarium appears intact. Sinuses/Orbits: Paranasal sinuses and mastoid air cells are clear. Other: None. IMPRESSION: No acute intracranial abnormalities. Chronic atrophy and small vessel ischemic changes. Old infarcts as discussed. Electronically Signed   By: Lucienne Capers M.D.   On: 02/11/2021 21:47      DG Chest Port 1 View Result Date: 03/19/2021 CLINICAL DATA:  Endotracheal tube and nasogastric tube placement. EXAM: PORTABLE CHEST 1 VIEW COMPARISON:  February 20, 2021 FINDINGS: The heart size and mediastinal contours are stable. Endotracheal tube is identified distal tip 5 cm from carina. Nasogastric tube is identified with distal tip in the proximal stomach. Mild increased pulmonary interstitium is identified in the bilateral upper lobes. There is no pleural effusion or focal pneumonia. There is no pneumothorax. The visualized skeletal structures are unremarkable. IMPRESSION: Endotracheal tube identified distal tip 5 cm from carina. Nasogastric tube distal tip in the proximal stomach. No pneumothorax. Mild increased pulmonary interstitium is identified in bilateral upper lobes. This can be seen in mild edema. Electronically Signed   By: Abelardo Diesel M.D.   On: 03/02/2021 10:32   EEG adult Result Date: 02/22/2021 Lora Havens, MD     02/22/2021  7:55 AM Patient Name: DARSHA ZUMSTEIN MRN: [id number] Epilepsy Attending: Lora Havens Referring Physician/Provider: Dr Kerney Elbe Date: 02/22/2021 Duration: 21.35 mins Patient history:  87 year old female presenting after a shaking spell at home with semiology suggestive of seizure. EEG to evaluate for seizure. Level of alertness: Awake AEDs during EEG study: LTG Technical aspects: This EEG study was done with scalp electrodes positioned according to the 10-20 International  system of electrode placement. Electrical activity was acquired at a sampling rate of $Remov'500Hz'BDGUom$  and reviewed with a high frequency filter of $RemoveB'70Hz'vryTOurb$  and a low frequency filter of $RemoveB'1Hz'SnIkNLWn$ . EEG data were recorded continuously and digitally stored. Description: The posterior dominant rhythm consists of 8 Hz activity of moderate voltage (25-35 uV) seen predominantly in posterior head regions, symmetric and reactive to eye opening and eye closing. EEG showed intermittent generalized 3 to 6 Hz theta-delta slowing. Hyperventilation and photic stimulation were not performed.   ABNORMALITY - Intermittent slow, generalized IMPRESSION: This study is suggestive of mild diffuse encephalopathy, nonspecific etiology. No seizures or epileptiform discharges were seen throughout the recording. Priyanka O Yadav    VAS US CAROTID (at [facility] and [facility] only) Result Date: 02/21/2021 Carotid Arterial  Duplex Study Patient Name:  MAGDALINA WHITEHEAD Beery  Date of Exam:   02/21/2021 Medical Rec #: [id number]       Accession #:    [id number] Date of Birth: Jul 14, 1934      Patient Gender: F Patient Age:   43 years Exam Location:  [facility] Procedure:      VAS US CAROTID Referring Phys: Harrold Donath --------------------------------------------------------------------------------  Indications:       TIA. Risk Factors:      Hypertension, hyperlipidemia, Diabetes, prior CVA. Comparison Study:  no prior Performing Technologist: Archie Patten RVS  Examination Guidelines: A complete evaluation includes B-mode imaging, spectral Doppler, color Doppler, and power Doppler as needed of all accessible portions of each vessel. Bilateral testing is considered an integral part of a complete examination. Limited examinations for reoccurring indications may be performed as noted.  Right Carotid Findings:                                  Summary: Right Carotid: Velocities in the right ICA are consistent with a 1-39% stenosis. Left Carotid: Velocities in the left ICA are  consistent with a 1-39% stenosis. Vertebrals: Right vertebral artery demonstrates antegrade flow. Left vertebral             artery demonstrates retrograde flow. *See table(s) above for measurements and observations.  Electronically signed by Monica Martinez MD on 02/21/2021 at 11:35:28 AM.    Final      Assessment and Plan:   Ventricular standstill CHB Recurrent long pauses associated with syncope (seizure-like activity) No nodal blocking agents at home or here    Dr. Lovena Le has seen and examined the patient., discussed with family at bedside Will follow her clinically for now, if she is able to extubate, get off pressor support will plan for PPM implant  Follow blood cultures (though drawn after some days on antibiotics) with recurrent UTI WBC is up   Risk Assessment/Risk Scores:    For questions or updates, please contact [place] Please consult [email or web address] for contact info under    Signed, Baldwin Jamaica, PA-C  03/09/2021 8:14 AM  EP Attending  Patient seen and examined. Agree with above. The patient presents with "seizures" due to CHB and long pauses due to transient AV block with no escape. She was not on any AV nodal blocking drugs. She is intubated, sedated and relatively hypotensive with a temporary PM in the right IJ. She has known dementia which is mild/moderate. On exam she is sedated and on propofol and pressor support. She has a RRR and clear lungs and no edema. Tele demonstrates NSR with a long first degree AV block.  A/P [employer] syncope with "seizures" - this is due to transient CHB with a very sluggish escape mechanism. I have recommended she undergo PPM insertion. However, she has been intubated and sedated and her central line is in the left IJ. I think we let her wake up to be sure she has not taken a neurological set back and to  be sure that she does not have other complicating issues before the placement of a PPM. I have discussed this with the  patient's family and they agree.   Carleene Overlie Abi Shoults,MD

## 2021-02-24 NOTE — Procedures (Signed)
Patient Name: [name]  MRN: [id number]  Epilepsy Attending: Lora Havens  Referring Physician/Provider: Dr Ina Homes Date: 03/04/2021 Duration: 30.43 mins   Patient history:  87 year old female presenting after a shaking spell at home with semiology suggestive of seizure. EEG to evaluate for seizure.    Level of alertness: Awake   AEDs during EEG study: LTG   Technical aspects: This EEG study was done with scalp electrodes positioned according to the 10-20 International system of electrode placement. Electrical activity was acquired at a sampling rate of 500Hz  and reviewed with a high frequency filter of 70Hz  and a low frequency filter of 1Hz . EEG data were recorded continuously and digitally stored.    Description: EEG showed continuous generalized 3 to 6 Hz theta-delta slowing. Hyperventilation and photic stimulation were not performed.      ABNORMALITY - Continuous slow, generalized   IMPRESSION: This study is suggestive of moderate diffuse encephalopathy, nonspecific etiology. No seizures or epileptiform discharges were seen throughout the recording.   [name] Barbra Sarks

## 2021-02-24 NOTE — TOC Progression Note (Signed)
Transition of Care [facility]) - Progression Note    Patient Details  Name: [name] MRN: [id number] Date of Birth: 19-Apr-1934  Transition of Care [facility]) CM/SW [place], [place] Phone Number: 03/13/2021, 12:34 PM  Clinical Narrative:     CSW called pt's daughter; verbally provided SNF bed offers. TOC staff will provide printed list of SNF offers bedside. She is appreciative. TOC will follow up when pt is more medically stable.   Expected Discharge Plan: [place] Barriers to Discharge: Continued Medical Work up, SNF Pending bed offer  Expected Discharge Plan and Services Expected Discharge Plan: [place] Choice: [place] arrangements for the past 2 months: Single Family Home                                       Social Determinants of Health (SDOH) Interventions    Readmission Risk Interventions No flowsheet data found.

## 2021-02-24 NOTE — Progress Notes (Signed)
OT Cancellation Note  Patient Details Name: TANAI BOULER MRN: [id number] DOB: Nov 14, 1934   Cancelled Treatment:    Reason Eval/Treat Not Completed: Patient not medically ready Pt currently intubated and sedated with noted decline in functional status since initial OT eval. Will hold therapy today and check back tomorrow   Layla Maw 03/11/2021, 12:37 PM

## 2021-02-24 NOTE — Consult Note (Addendum)
NAME:  [name], MRN:  [id number], DOB:  Feb 16, 1935, LOS: 2 ADMISSION DATE:  02/18/2021, CONSULTATION DATE:  03/20/2021 REFERRING MD:  TRH, CHIEF COMPLAINT:  syncope   History of Present Illness:  87 year old woman with hx of hypothyroidism, prior CVA, dementia who presented with syncopal episode with possible seizure like activity at home on 02/17/2021.  Workup so far has revealed grade 1 diastolic dysfunction, no evidence of ongoing seizures, possible UTI.  Was being worked up for SNF placement but this AM became more somnolent then unresponsive.  Son at bedside reversed DNR status.  PEA arrest and given CPR, brief, woke back up but not protecting airway, gurgling respirations.  Intubated by anesthesia.  Pertinent  Medical History  GERD Dementia Prior cerebellar infarct DM2 PMR HTN Hypothyroidism PUD  Significant Hospital Events: Including procedures, antibiotic start and stop dates in addition to other pertinent events   02/12/2021 admitted 02/22/2021 code and temporary PM placed  Interim History / Subjective:  S/p temp PM yesterday On minimal vent settings  Objective   Blood pressure (!) 84/33, pulse (!) 57, temperature 98.5 F (36.9 C), resp. rate (!) 22, height 5\' 4"  (1.626 m), weight 81.9 kg, SpO2 99 %.    Vent Mode: PRVC FiO2 (%):  [40 %-100 %] 40 % Set Rate:  [18 bmp-22 bmp] 22 bmp Vt Set:  [430 mL] 430 mL PEEP:  [5 cmH20] 5 cmH20 Plateau Pressure:  [13 cmH20-16 cmH20] 14 cmH20   Intake/Output Summary (Last 24 hours) at 03/07/2021 0815 Last data filed at 03/20/2021 0800 Gross per 24 hour  Intake 2460.59 ml  Output --  Net 2460.59 ml   Filed Weights   02/19/2021 1604 03/01/2021 0500 03/17/2021 0412  Weight: 72.6 kg 77.5 kg 81.9 kg   Physical Exam: General: Critically ill appearing, drowsy, no acute distress HENT: [place], AT, ETT in place Eyes: EOMI, no scleral icterus Respiratory: Diminished bilaterally.  No crackles, wheezing or rales Cardiovascular: Paced, RRR, -M/R/G,  no JVD GI: BS+, soft, nontender Extremities:-Edema,-tenderness Neuro: Drowsy, opens eyes to voice, attempts to track, does not follow commands  Cr mildly elevated to 1.03 WBC elevated 16.5  CXR 03/07/2021 - L IJ CVC for temp pacer  Resolved Hospital Problem list   N/a  Assessment & Plan:  In hospital cardiac arrest secondary to CHB s/p temp PM 1/2  Acute encephalopathy secondary to cardiac arrest Acute hypoxemic respiratory failure due to inability to protect airway and likely aspiration. Hypotension, suspected secondary to sedation Multiple comorbidities include GERD, PMR, CVA, dementia, gait abnormality Klebsiella UTI Prior DNR- rescinded in context of arrest  - PM management per primary team - Full vent support with PAD protocol for RASS goal -1. Wean off propofol and Fentanyl if able. Start precedex if needed - SBT/WUA today - Wean levophed for MAP goal >65 - Continue lamictal, f/u EEG - Continue ceftriaxone for 5-7 days  Updated family at bedside regarding plan to assess for vent weaning. Mental status remains concerning. May evaluate with MRI if not returned to baseline.  Best Practice (right click and "Reselect all SmartList Selections" daily)   Diet/type: NPO DVT prophylaxis: SCD GI prophylaxis: pepcid Lines: N/A Foley:  N/A Code Status:  full code Last date of multidisciplinary goals of care discussion [pending]  Critical care time:    The patient is critically ill with acute respiratory failure secondary to cardiac arrest due to complete heart block and requires high complexity decision making for assessment and support, frequent evaluation and  titration of therapies, application of advanced monitoring technologies and extensive interpretation of multiple databases.  Independent Critical Care Time: 40 Minutes.   Rodman Pickle, M.D. [facility] Pulmonary/Critical Care Medicine 02/26/2021 8:16 AM   Please see Amion for pager number to reach on-call Pulmonary and Critical  Care Team.

## 2021-02-25 ENCOUNTER — Inpatient Hospital Stay (HOSPITAL_COMMUNITY): Payer: Medicare Other

## 2021-02-25 ENCOUNTER — Other Ambulatory Visit: Payer: Self-pay | Admitting: *Deleted

## 2021-02-25 DIAGNOSIS — I442 Atrioventricular block, complete: Secondary | ICD-10-CM | POA: Diagnosis not present

## 2021-02-25 DIAGNOSIS — I469 Cardiac arrest, cause unspecified: Secondary | ICD-10-CM | POA: Diagnosis not present

## 2021-02-25 DIAGNOSIS — R55 Syncope and collapse: Secondary | ICD-10-CM | POA: Diagnosis not present

## 2021-02-25 LAB — BASIC METABOLIC PANEL
Anion gap: 9 (ref 5–15)
BUN: 17 mg/dL (ref 8–23)
CO2: 20 mmol/L — ABNORMAL LOW (ref 22–32)
Calcium: 8.3 mg/dL — ABNORMAL LOW (ref 8.9–10.3)
Chloride: 106 mmol/L (ref 98–111)
Creatinine, Ser: 1.29 mg/dL — ABNORMAL HIGH (ref 0.44–1.00)
GFR, Estimated: 41 mL/min — ABNORMAL LOW (ref >60)
Glucose, Bld: 132 mg/dL — ABNORMAL HIGH (ref 70–99)
Potassium: 3.8 mmol/L (ref 3.5–5.1)
Sodium: 135 mmol/L (ref 135–145)

## 2021-02-25 LAB — MAGNESIUM
Magnesium: 2.1 mg/dL (ref 1.7–2.4)
Magnesium: 1.9 mg/dL (ref 1.7–2.4)

## 2021-02-25 LAB — POCT I-STAT 7, (LYTES, BLD GAS, ICA,H+H)
Acid-base deficit: 5 mmol/L — ABNORMAL HIGH (ref 0.0–2.0)
Bicarbonate: 20.3 mmol/L (ref 20.0–28.0)
Calcium, Ion: 1.22 mmol/L (ref 1.15–1.40)
HCT: 30 % — ABNORMAL LOW (ref 36.0–46.0)
Hemoglobin: 10.2 g/dL — ABNORMAL LOW (ref 12.0–15.0)
O2 Saturation: 99 %
Patient temperature: 97.8
Potassium: 4.3 mmol/L (ref 3.5–5.1)
Sodium: 137 mmol/L (ref 135–145)
TCO2: 21 mmol/L — ABNORMAL LOW (ref 22–32)
pCO2 arterial: 35 mmHg (ref 32.0–48.0)
pH, Arterial: 7.369 (ref 7.350–7.450)
pO2, Arterial: 135 mmHg — ABNORMAL HIGH (ref 83.0–108.0)

## 2021-02-25 LAB — GLUCOSE, CAPILLARY
Glucose-Capillary: 141 mg/dL — ABNORMAL HIGH (ref 70–99)
Glucose-Capillary: 132 mg/dL — ABNORMAL HIGH (ref 70–99)
Glucose-Capillary: 121 mg/dL — ABNORMAL HIGH (ref 70–99)
Glucose-Capillary: 131 mg/dL — ABNORMAL HIGH (ref 70–99)
Glucose-Capillary: 127 mg/dL — ABNORMAL HIGH (ref 70–99)
Glucose-Capillary: 125 mg/dL — ABNORMAL HIGH (ref 70–99)
Glucose-Capillary: 147 mg/dL — ABNORMAL HIGH (ref 70–99)

## 2021-02-25 LAB — CBC
HCT: 32.3 % — ABNORMAL LOW (ref 36.0–46.0)
Hemoglobin: 10.8 g/dL — ABNORMAL LOW (ref 12.0–15.0)
MCH: 33.2 pg (ref 26.0–34.0)
MCHC: 33.4 g/dL (ref 30.0–36.0)
MCV: 99.4 fL (ref 80.0–100.0)
Platelets: 180 10*3/uL (ref 150–400)
RBC: 3.25 MIL/uL — ABNORMAL LOW (ref 3.87–5.11)
RDW: 12.7 % (ref 11.5–15.5)
WBC: 8.9 10*3/uL (ref 4.0–10.5)
nRBC: 0 % (ref 0.0–0.2)

## 2021-02-25 LAB — PHOSPHORUS: Phosphorus: 3.4 mg/dL (ref 2.5–4.6)

## 2021-02-25 MED ORDER — FENTANYL CITRATE PF 50 MCG/ML IJ SOSY
25.0000 ug | PREFILLED_SYRINGE | INTRAMUSCULAR | Status: DC | PRN
  Administered 2021-02-26: 100 ug via INTRAVENOUS
  Administered 2021-02-26 – 2021-02-27 (×3): 50 ug via INTRAVENOUS
  Administered 2021-02-27 (×2): 100 ug via INTRAVENOUS
  Filled 2021-02-25: qty 2
  Filled 2021-02-25: qty 1
  Filled 2021-02-25 (×3): qty 2

## 2021-02-25 MED ORDER — ENOXAPARIN SODIUM 30 MG/0.3ML IJ SOSY
30.0000 mg | PREFILLED_SYRINGE | INTRAMUSCULAR | Status: DC
  Administered 2021-02-25 – 2021-02-26 (×2): 30 mg via SUBCUTANEOUS
  Filled 2021-02-25 (×2): qty 0.3

## 2021-02-25 MED ORDER — PROSOURCE TF PO LIQD
45.0000 mL | Freq: Every day | ORAL | Status: DC
  Administered 2021-02-25 – 2021-02-27 (×3): 45 mL
  Filled 2021-02-25 (×3): qty 45

## 2021-02-25 MED ORDER — SODIUM CHLORIDE 0.9 % IV SOLN
2.0000 g | Freq: Two times a day (BID) | INTRAVENOUS | Status: DC
  Administered 2021-02-25 – 2021-02-27 (×5): 2 g via INTRAVENOUS
  Filled 2021-02-25 (×5): qty 2

## 2021-02-25 MED ORDER — OSMOLITE 1.2 CAL PO LIQD
1000.0000 mL | ORAL | Status: DC
  Administered 2021-02-25: 1000 mL
  Filled 2021-02-25 (×4): qty 1000

## 2021-02-25 MED ORDER — POTASSIUM CHLORIDE 20 MEQ PO PACK
40.0000 meq | PACK | Freq: Two times a day (BID) | ORAL | Status: DC
  Administered 2021-02-25: 40 meq
  Filled 2021-02-25 (×2): qty 2

## 2021-02-25 NOTE — Progress Notes (Signed)
°   02/25/21 1130  Clinical Encounter Type  Visited With Family  Visit Type Follow-up;Psychological support;Social support;Critical Care   [place] visited today per page from charge RN saying family requested visit. Pt. and family known to this author from encounter on Monday.  Pt. lying in bed intubated with dtr. Sonia Baller and son [name] at bedside.  Family expressed interest in having a Catholic priest come administer Anointing of the Sick for pt. tomorrow after learning that this sacrament is appropriate in cases of critical illness and not solely at end of life.  [facility] will follow up in AM of 1/5.  Lindaann Pascal, Chaplain Pager: [phone]

## 2021-02-25 NOTE — Progress Notes (Signed)
eLink Physician-Brief Progress Note Patient Name: [name] DOB: 02/22/1935 MRN: [id number]   Date of Service  02/25/2021  HPI/Events of Note  OGT came out partially and was replaced by nursing.   eICU Interventions  Plan: Portable Abdominal film to confirm OGT position STAT.     Intervention Category Major Interventions: Other:  Lysle Dingwall 02/25/2021, 9:51 PM

## 2021-02-25 NOTE — Progress Notes (Signed)
Subjective:  Patient seen and examined at bedside at approximately 8:00 a.m. today.  Patient's daughter was at bedside.  Patient remains intubated and sedated. She remains on norepinephrine.  Yesterday had SVT requiring administration of adenosine. She was also started on Precedex for sedation. She converted back to sinus rhythm.    Intake/Output from previous day:  I/O last 3 completed shifts: In: 2639.4 [I.V.:2439.4; IV Piggyback:200] Out: 500 [Urine:500] No intake/output data recorded.  Blood pressure (!) 137/59, pulse (!) 50, temperature 97.7 F (36.5 C), temperature source Axillary, resp. rate (!) 22, height _0  (1.626 m), weight 79.7 kg, SpO2 97 %. Physical Exam Vitals reviewed.  Constitutional:      Interventions: She is sedated and intubated.     Comments: ETT in place  HENT:     Head: Normocephalic and atraumatic.  Cardiovascular:     Rate and Rhythm: Regular rhythm. Bradycardia present.     Pulses: Intact distal pulses.     Heart sounds: S1 normal and S2 normal. No murmur heard.   No gallop.  Pulmonary:     Effort: She is intubated.     Comments: Decreased breath sounds bilaterally Musculoskeletal:     Right lower leg: No edema.     Left lower leg: No edema.  Skin:    General: Skin is warm and dry.   Lab Results: BMP BNP (last 3 results) No results for input(s): BNP in the last 8760 hours.  ProBNP (last 3 results) No results for input(s): PROBNP in the last 8760 hours. BMP Latest Ref Rng & Units 02/25/2021 03/16/2021 03/22/2021  Glucose 70 - 99 mg/dL 132(H) 161(H) -  BUN 8 - 23 mg/dL 17 12 -  Creatinine 0.44 - 1.00 mg/dL 1.29(H) 1.16(H) -  Sodium 135 - 145 mmol/L 135 135 136  Potassium 3.5 - 5.1 mmol/L 3.8 3.7 3.6  Chloride 98 - 111 mmol/L 106 103 -  CO2 22 - 32 mmol/L 20(L) 22 -  Calcium 8.9 - 10.3 mg/dL 8.3(L) 8.6(L) -   Hepatic Function Latest Ref Rng & Units 03/09/2021 02/22/2021 01/22/2021  Total Protein 6.5 - 8.1 g/dL 6.1(L) 6.3(L) 7.1  Albumin 3.5 -  5.0 g/dL 3.2(L) 3.3(L) 3.6  AST 15 - 41 U/L _1 ALT 0 - 44 U/L _2 Alk Phosphatase 38 - 126 U/L 50 40 54  Total Bilirubin 0.3 - 1.2 mg/dL 1.1 0.9 0.5   CBC Latest Ref Rng & Units 02/25/2021 03/17/2021 03/14/2021  WBC 4.0 - 10.5 K/uL 8.9 11.1(H) -  Hemoglobin 12.0 - 15.0 g/dL 10.8(L) 12.6 13.6  Hematocrit 36.0 - 46.0 % 32.3(L) 37.7 40.0  Platelets 150 - 400 K/uL 180 245 -   Lipid Panel     Component Value Date/Time   CHOL 204 (H) 02/21/2021 0500   TRIG 127 03/14/2021 0553   HDL 42 02/21/2021 0500   CHOLHDL 4.9 02/21/2021 0500   VLDL 22 02/21/2021 0500   LDLCALC 140 (H) 02/21/2021 0500   Cardiac Panel (last 3 results) No results for input(s): CKTOTAL, CKMB, TROPONINI, RELINDX in the last 72 hours.  HEMOGLOBIN A1C Lab Results  Component Value Date   HGBA1C 5.7 (H) 02/21/2021   MPG 116.89 02/21/2021   TSH Recent Labs    02/21/21 0630  TSH 1.430    Imaging: DG Chest 1 View  Result Date: 03/23/2021 CLINICAL DATA:  Central line placement. EXAM: CHEST  1 VIEW COMPARISON:  Same day. FINDINGS: Stable position of endotracheal and nasogastric tubes. Interval  placement of left internal jugular catheter with distal tip in expected position of the SVC. Pneumothorax is noted. IMPRESSION: Interval placement of left internal jugular catheter with distal tip in expected position of the SVC. Electronically Signed   By: Marijo Conception M.D.   On: 03/20/2021 11:32   DG Abd 1 View  Result Date: 03/11/2021 CLINICAL DATA:  OG tube placement. EXAM: ABDOMEN - 1 VIEW COMPARISON:  03/31/2012 FINDINGS: An enteric tube is present with tip overlying the left upper quadrant in the expected region of the proximal gastric body and with the side hole also below the diaphragm. No dilated loops of bowel are seen in the included upper portion of the abdomen. The lungs and mediastinum more better evaluated on today's earlier chest radiograph. IMPRESSION: Enteric tube placement as above. Electronically Signed    By: Logan Bores M.D.   On: 03/01/2021 13:36   CARDIAC CATHETERIZATION  Result Date: 03/23/2021 Procedure performed: Ultrasound-guided access of the right common femoral vein, placement of a 6 French long sheath followed by placement of a balloontipped temporary transvenous pacemaker under fluoroscopy guidance. No procedural complications.  Ventricular capture obtained at 0.5 MV.  Catheter tip at 58 cm, settings included demand 50 bpm at output of 5 MV. Access site and pacemaker were carefully sutured in place and secured.  Discussed with family.   DG Chest Port 1 View  Result Date: 02/28/2021 CLINICAL DATA:  Endotracheal tube and nasogastric tube placement. EXAM: PORTABLE CHEST 1 VIEW COMPARISON:  February 20, 2021 FINDINGS: The heart size and mediastinal contours are stable. Endotracheal tube is identified distal tip 5 cm from carina. Nasogastric tube is identified with distal tip in the proximal stomach. Mild increased pulmonary interstitium is identified in the bilateral upper lobes. There is no pleural effusion or focal pneumonia. There is no pneumothorax. The visualized skeletal structures are unremarkable. IMPRESSION: Endotracheal tube identified distal tip 5 cm from carina. Nasogastric tube distal tip in the proximal stomach. No pneumothorax. Mild increased pulmonary interstitium is identified in bilateral upper lobes. This can be seen in mild edema. Electronically Signed   By: Abelardo Diesel M.D.   On: 03/10/2021 10:32   EEG adult  Result Date: 03/06/2021 Lora Havens, MD     03/09/2021 12:01 PM Patient Name: NICEY KRAH MRN: [id number] Epilepsy Attending: Lora Havens Referring Physician/Provider: Dr Ina Homes Date: 03/14/2021 Duration: 30.43 mins  Patient history:  87 year old female presenting after a shaking spell at home with semiology suggestive of seizure. EEG to evaluate for seizure.  Level of alertness: Awake  AEDs during EEG study: LTG  Technical aspects: This EEG study was  done with scalp electrodes positioned according to the 10-20 International system of electrode placement. Electrical activity was acquired at a sampling rate of 500Hz and reviewed with a high frequency filter of 70Hz and a low frequency filter of 1Hz. EEG data were recorded continuously and digitally stored.  Description: EEG showed continuous generalized 3 to 6 Hz theta-delta slowing. Hyperventilation and photic stimulation were not performed.    ABNORMALITY - Continuous slow, generalized  IMPRESSION: This study is suggestive of moderate diffuse encephalopathy, nonspecific etiology. No seizures or epileptiform discharges were seen throughout the recording.  Lora Havens    Cardiac Studies: Echocardiogram 02/21/2021:     1. Left ventricular ejection fraction, by estimation, is 60 to 65%. The left ventricle has normal function. The left ventricle has no regional wall motion abnormalities. There is mild left ventricular hypertrophy.  Left ventricular diastolic parameters  are consistent with Grade I diastolic dysfunction (impaired relaxation).  2. Right ventricular systolic function is normal. The right ventricular size is normal.  3. The mitral valve is degenerative. Trivial mitral valve regurgitation. No evidence of mitral stenosis. Moderate mitral annular calcification.  4. Calcified AV with small gradient but AVA 2.3 cm2. The aortic valve is normal in structure. There is moderate calcification of the aortic valve. There is moderate thickening of the aortic valve. Aortic valve regurgitation is not visualized. Aortic  valve sclerosis/calcification is present, without any evidence of aortic stenosis.  5. The inferior vena cava is normal in size with greater than 50% respiratory variability, suggesting right atrial pressure of 3 mmHg.  Temporary Pacemaker placement 02/22/2021: Procedure performed: Ultrasound-guided access of the right common femoral vein, placement of a 6 French long sheath followed by  placement of a balloontipped temporary transvenous pacemaker under fluoroscopy guidance. No procedural complications.  Ventricular capture obtained at 0.5 MV.  Catheter tip at 58 cm, settings included demand 50 bpm at output of 5 MV. Access site and pacemaker were carefully sutured in place and secured.  Discussed with family.    EKG: 01/02/202 3: Sinus rhythm with first-degree AV block at the rate of 88 bpm, left axis deviation, left anterior fascicular block.  Right bundle branch block.  Trifascicular block.   Telemetry:  Sinus rhythm with 1st degree AV block, 50s bpm During cardiac arrest shows underlying sinus rhythm with ventricular standstill.  Scheduled Meds:   stroke: mapping our early stages of recovery book   Does not apply Once   chlorhexidine gluconate (MEDLINE KIT)  15 mL Mouth Rinse BID   Chlorhexidine Gluconate Cloth  6 each Topical Daily   clopidogrel  75 mg Per Tube Q breakfast   docusate  100 mg Per Tube BID   donepezil  10 mg Per Tube QHS   fentaNYL (SUBLIMAZE) injection  25 mcg Intravenous Once   insulin aspart  0-9 Units Subcutaneous Q4H   lamoTRIgine  150 mg Per Tube BID   levothyroxine  100 mcg Per Tube Daily   mouth rinse  15 mL Mouth Rinse 10 times per day   polyethylene glycol  17 g Per Tube Daily   QUEtiapine  25 mg Per Tube QHS   sodium chloride flush  3 mL Intravenous Q12H   Continuous Infusions:  sodium chloride 10 mL/hr at 02/25/21 0700   cefTRIAXone (ROCEPHIN)  IV Stopped (03/08/2021 1332)   dexmedetomidine (PRECEDEX) IV infusion 0.7 mcg/kg/hr (02/25/21 0700)   diltiazem (CARDIZEM) infusion Stopped (03/23/2021 1415)   famotidine (PEPCID) IV Stopped (03/07/2021 0959)   fentaNYL infusion INTRAVENOUS 100 mcg/hr (02/25/21 0700)   norepinephrine (LEVOPHED) Adult infusion 1 mcg/min (02/25/21 0700)   PRN Meds:.acetaminophen **OR** acetaminophen (TYLENOL) oral liquid 160 mg/5 mL **OR** acetaminophen, fentaNYL, ondansetron (ZOFRAN) IV,  senna-docusate  Assessment/Plan:  Appreciate recommendations of critical care team.  Plan is to attempt to wean patient off pressors and ventilator as able.    Recommend continued supportive care.  Electrophysiology team has recommended holding off on permanent pacemaker implantation at this time.  We will reevaluate pacemaker implantation if patient is able to be extubated and, off pressors.    If patient has recurrence of SVT can use IV diltiazem as needed.  Patient was seen in collaboration with Dr. Einar Gip. He also reviewed patient's chart and examined the patient. Dr. Einar Gip is in agreement of the plan.    Alethia Berthold, PA-C 02/25/2021, 8:40 AM  Office: [phone]

## 2021-02-25 NOTE — Progress Notes (Signed)
OT Cancellation Note  Patient Details Name: [name] MRN: [id number] DOB: 01-26-1935   Cancelled Treatment:    Reason Eval/Treat Not Completed: Patient not medically ready Pt remains intubated and sedated. Will hold OT attempts today and check back again this week.   Layla Maw 02/25/2021, 9:42 AM

## 2021-02-25 NOTE — Progress Notes (Signed)
PT Cancellation Note  Patient Details Name: [name] MRN: [id number] DOB: 1934/05/20   Cancelled Treatment:    Reason Eval/Treat Not Completed: Medical issues which prohibited therapy. Pt remains intubated and sedated. Will continue to monitor for now.   Shary Decamp [facility] 02/25/2021, 9:51 AM [place] Pager [phone] Office [phone]

## 2021-02-25 NOTE — Consult Note (Addendum)
NAME:  [name], MRN:  [id number], DOB:  12/25/1934, LOS: 3 ADMISSION DATE:  02/19/2021, CONSULTATION DATE:  03/23/2021 REFERRING MD:  TRH, CHIEF COMPLAINT:  syncope   History of Present Illness:  87 year old woman with hx of hypothyroidism, prior CVA, dementia who presented with syncopal episode with possible seizure like activity at home on 02/07/2021.  Workup so far has revealed grade 1 diastolic dysfunction, no evidence of ongoing seizures, possible UTI.  Was being worked up for SNF placement but this AM became more somnolent then unresponsive.  Son at bedside reversed DNR status.  PEA arrest and given CPR, brief, woke back up but not protecting airway, gurgling respirations.  Intubated by anesthesia.  Pertinent  Medical History  GERD Dementia Prior cerebellar infarct DM2 PMR HTN Hypothyroidism PUD  Significant Hospital Events: Including procedures, antibiotic start and stop dates in addition to other pertinent events   02/19/2021 admitted 03/20/2021 code and temporary PM placed 03/18/2021 SVT s/p adenosine x 2. Increased levop support. After this Code status changed to DNR (no chest compressions/shocks) per family  Interim History / Subjective:  Episode of SVT yesterday s/p adenosine x 2 Changed code status to DNR yesterday per family  Objective   Blood pressure (!) 137/59, pulse (!) 50, temperature 97.7 F (36.5 C), temperature source Axillary, resp. rate (!) 22, height 5\' 4"  (1.626 m), weight 79.7 kg, SpO2 99 %.    Vent Mode: PRVC FiO2 (%):  [40 %] 40 % Set Rate:  [22 bmp] 22 bmp Vt Set:  [430 mL] 430 mL PEEP:  [5 cmH20] 5 cmH20 Plateau Pressure:  [13 cmH20-14 cmH20] 13 cmH20   Intake/Output Summary (Last 24 hours) at 02/25/2021 1012 Last data filed at 02/25/2021 0700 Gross per 24 hour  Intake 1180.43 ml  Output 500 ml  Net 680.43 ml   Filed Weights   03/14/2021 0500 03/21/2021 0412 02/25/21 0500  Weight: 77.5 kg 81.9 kg 79.7 kg   Physical Exam: General: Critically ill  appearing, drowsy, no acute distress HENT: [place], AT, ETT in place Eyes: EOMI, no scleral icterus Respiratory: Diminished bilaterally.  No crackles, wheezing or rales Cardiovascular: Paced, RRR, -M/R/G, no JVD GI: BS+, soft, nontender Extremities:-Edema,-tenderness Neuro: Drowsy, opens eyes to voice, attempts to track, does not follow commands  Cr increasing to 1.29 Leukocytosis as resolved  CXR 03/19/2021 - L IJ CVC for temp pacer  EEG 1/3 neg for seizures  Resolved Hospital Problem list   N/a  Assessment & Plan:  In hospital cardiac arrest secondary to CHB s/p temp PM 1/2  Acute encephalopathy secondary to cardiac arrest - EEG neg. Mental status remains concerning. May evaluate with MRI however will need to investigate bladder stimulator if compatible with MRI Acute hypoxemic respiratory failure due to inability to protect airway and likely aspiration. Hypotension, suspected secondary to sedation - resolving Multiple comorbidities include GERD, PMR, CVA, dementia, gait abnormality Klebsiella UTI Prior DNR- rescinded in context of arrest. Now currently DNR since 1/3  - PM management per primary team - Full vent support - Daily SBT/WUA today - PAD protocol for RASS goal -1: Wean off Fentanyl and Precedex  - Wean levophed for MAP goal >65 - Continue lamictal - Continue cefepime for 7 days - Monitor UOP/Cr  Updated family at bedside regarding plan  Best Practice (right click and "Reselect all SmartList Selections" daily)   Diet/type: tubefeeds DVT prophylaxis: LMWH GI prophylaxis: pepcid Lines: N/A Foley:  Yes, and it is still needed Code Status:  limited No chest compressions or shocks Last date of multidisciplinary goals of care discussion [pending]  Critical care time:     The patient is critically ill with multiple organ systems failure and requires high complexity decision making for assessment and support, frequent evaluation and titration of therapies, application of  advanced monitoring technologies and extensive interpretation of multiple databases.  Independent Critical Care Time: 32 Minutes.   Rodman Pickle, M.D. [facility] Pulmonary/Critical Care Medicine 02/25/2021 10:37 AM   Please see Amion for pager number to reach on-call Pulmonary and Critical Care Team.

## 2021-02-25 NOTE — TOC Progression Note (Signed)
Transition of Care [facility]) - Progression Note    Patient Details  Name: [name] MRN: [id number] Date of Birth: 1934-08-16  Transition of Care [facility]) CM/SW [place], [place] Phone Number: 02/25/2021, 4:01 PM  Clinical Narrative:      Family reviewing SNF bed offers for patient. CSW will follow up with family on SNF choice when patient more medically stable.CSW will continue to follow and assist with patients dc planning needs.   Expected Discharge Plan: [place] Barriers to Discharge: Continued Medical Work up, SNF Pending bed offer  Expected Discharge Plan and Services Expected Discharge Plan: [place] Choice: [place] arrangements for the past 2 months: Single Family Home                                       Social Determinants of Health (SDOH) Interventions    Readmission Risk Interventions No flowsheet data found.

## 2021-02-25 NOTE — Patient Outreach (Signed)
[facility]) Care Management  02/25/2021  LESHAY DESAULNIERS 08-24-1934 [id number]  Telephone Assessment  RN received a call from pt's son [name]). RN has histry with caring for this pt and pt was transitioned over to a health coach. Caregiver reports pt is in the cardiovascular ICU unit due to cardiac events however pt confirms they were not a heart attack. States pt was pending a home visit with [facility] Katheren Puller however pt would not be at home for this visit. Offered to contact [facility] and make this Education officer, museum aware (receptive). Left a voice message for this party concerning pt's disposition. Will also contact [facility] hospital liaison concerning this pt.   Will follow once again with community care management services and contact pt once she returns phone. Will also alert the involved health coach [place] Pleasant of pt's disposition.  No immediate needs presented at this time.  Raina Mina, RN Care Management Coordinator [place] Office [phone]

## 2021-02-25 NOTE — Progress Notes (Signed)
OG noted to be 10cm out from previous measurement. Tube clamped, advanced. [place] notified, asked for Xray orders to verify placement. Awaiting further orders. Holding meds until placement verified.

## 2021-02-25 NOTE — Consult Note (Addendum)
° °  [facility] Inpatient Consult   02/25/2021  AVARY EICHENBERGER [birth year] [id number]  [place] [facility] [ACO] Patient: Medicare  Acknowledgement of active status  Call received from [place]  Primary Care Provider:  Shon Baton, MD, [place] who is listed to provide the [facility] calls and follow up appointments  Patient is currently active with [place] [facility] for chronic disease management services.  Patient has been engaged by a [place] and has been active with RN Care Coordinator.  Our community based plan of care has focused on disease management and community resource support.  Patient is currently in ICU.  Plan: Continue to follow with Inpatient Transition Of Care [TOC] team member to make aware that [place] Management following. Of note, [facility] Care Management services does not replace or interfere with any services that are needed or arranged by inpatient [facility] care management team.  For additional questions or referrals please contact:  Natividad Brood, RN BSN [place] [facility] Liaison  [phone] business mobile phone Toll free office [phone]  Fax number: [phone] [email or web address]

## 2021-02-25 NOTE — Progress Notes (Signed)
eLink Physician-Brief Progress Note Patient Name: [name] DOB: August 14, 1934 MRN: [id number]   Date of Service  02/25/2021  HPI/Events of Note  Patient with oliguria secondary to urinary retention.  eICU Interventions  Foley catheter protocol ordered.        Kerry Kass [name] 02/25/2021, 4:25 AM

## 2021-02-25 NOTE — Progress Notes (Addendum)
Progress Note  Patient Name: JAMYAH FOLK Date of Encounter: 02/25/2021  [facility] Cardiologist: Peter Martinique, MD   Subjective   Intubated, on prcedex, low dose levo  Inpatient Medications    Scheduled Meds:   stroke: mapping our early stages of recovery book   Does not apply Once   chlorhexidine gluconate (MEDLINE KIT)  15 mL Mouth Rinse BID   Chlorhexidine Gluconate Cloth  6 each Topical Daily   docusate  100 mg Per Tube BID   donepezil  10 mg Per Tube QHS   enoxaparin (LOVENOX) injection  30 mg Subcutaneous Q24H   fentaNYL (SUBLIMAZE) injection  25 mcg Intravenous Once   insulin aspart  0-9 Units Subcutaneous Q4H   lamoTRIgine  150 mg Per Tube BID   levothyroxine  100 mcg Per Tube Daily   mouth rinse  15 mL Mouth Rinse 10 times per day   polyethylene glycol  17 g Per Tube Daily   potassium chloride  40 mEq Per Tube BID   QUEtiapine  25 mg Per Tube QHS   sodium chloride flush  3 mL Intravenous Q12H   Continuous Infusions:  sodium chloride 10 mL/hr at 02/25/21 0700   ceFEPime (MAXIPIME) IV     dexmedetomidine (PRECEDEX) IV infusion 0.7 mcg/kg/hr (02/25/21 0700)   diltiazem (CARDIZEM) infusion Stopped (03/02/2021 1415)   famotidine (PEPCID) IV Stopped (03/06/2021 0959)   fentaNYL infusion INTRAVENOUS 100 mcg/hr (02/25/21 0700)   norepinephrine (LEVOPHED) Adult infusion 1 mcg/min (02/25/21 0700)   PRN Meds: acetaminophen **OR** acetaminophen (TYLENOL) oral liquid 160 mg/5 mL **OR** acetaminophen, fentaNYL, ondansetron (ZOFRAN) IV, senna-docusate   Vital Signs    Vitals:   02/25/21 0630 02/25/21 0645 02/25/21 0700 02/25/21 0810  BP: 127/65 132/75 (!) 137/59   Pulse:      Resp: (!) 22 (!) 22 (!) 22   Temp:   97.7 F (36.5 C)   TempSrc:   Axillary   SpO2: 98% 97% 97% 99%  Weight:      Height:        Intake/Output Summary (Last 24 hours) at 02/25/2021 1031 Last data filed at 02/25/2021 0700 Gross per 24 hour  Intake 1180.43 ml  Output 500 ml  Net 680.43 ml    Last 3 Weights 02/25/2021 02/26/2021 03/23/2021  Weight (lbs) 175 lb 11.3 oz 180 lb 8.9 oz 170 lb 13.7 oz  Weight (kg) 79.7 kg 81.9 kg 77.5 kg      Telemetry    Asynchronous pacing - Personally Reviewed  ECG    No new EKGs - Personally Reviewed  Physical Exam   GEN: intubated and sedated Neck: No JVD Cardiac: RRR, no murmurs, rubs, or gallops.  Respiratory: CTA b/l. GI: Soft  MS: No edema; No deformity. Neuro:  unable to assess  Psych: unable to assess   Labs    High Sensitivity Troponin:   Recent Labs  Lab 03/08/2021 1138 02/22/2021 1154  TROPONINIHS 223* 374*     Chemistry Recent Labs  Lab 01/31/2021 1917 02/22/21 0106 03/22/2021 1138 02/23/21 1208 03/23/2021 0907 02/25/21 0223  NA 135 135 136 136 135 135  K 4.2 4.5 3.6 3.6 3.7 3.8  CL 100 104 101  --  103 106  CO2 $Re'28 23 24  'GHY$ --  22 20*  GLUCOSE 198* 112* 255*  --  161* 132*  BUN $Re'20 15 14  'eBk$ --  12 17  CREATININE 1.14* 0.93 1.03*  --  1.16* 1.29*  CALCIUM 9.1 8.6* 8.5*  --  8.6* 8.3*  MG  --   --  1.6*  --  1.6* 2.1  PROT 7.1 6.3*  --   --  6.1*  --   ALBUMIN 3.6 3.3*  --   --  3.2*  --   AST 15 19  --   --  31  --   ALT 12 9  --   --  28  --   ALKPHOS 54 40  --   --  50  --   BILITOT 0.5 0.9  --   --  1.1  --   GFRNONAA 47* >60 54*  --  46* 41*  ANIONGAP $RemoveB'7 8 11  'kMbMQZBM$ --  10 9    Lipids  Recent Labs  Lab 02/21/21 0500 03/01/2021 0553  CHOL 204*  --   TRIG 112 127  HDL 42  --   LDLCALC 140*  --   CHOLHDL 4.9  --     Hematology Recent Labs  Lab 03/11/2021 1138 03/08/2021 1208 02/22/2021 0907 02/25/21 0223  WBC 16.5*  --  11.1* 8.9  RBC 3.79*  --  3.76* 3.25*  HGB 12.7 13.6 12.6 10.8*  HCT 38.4 40.0 37.7 32.3*  MCV 101.3*  --  100.3* 99.4  MCH 33.5  --  33.5 33.2  MCHC 33.1  --  33.4 33.4  RDW 12.4  --  12.8 12.7  PLT 261  --  245 180   Thyroid  Recent Labs  Lab 02/21/21 0630  TSH 1.430    BNPNo results for input(s): BNP, PROBNP in the last 168 hours.  DDimer No results for input(s): DDIMER in the  last 168 hours.   Radiology      Cardiac Studies   02/21/21: TTE IMPRESSIONS   1. Left ventricular ejection fraction, by estimation, is 60 to 65%. The  left ventricle has normal function. The left ventricle has no regional  wall motion abnormalities. There is mild left ventricular hypertrophy.  Left ventricular diastolic parameters  are consistent with Grade I diastolic dysfunction (impaired relaxation).   2. Right ventricular systolic function is normal. The right ventricular  size is normal.   3. The mitral valve is degenerative. Trivial mitral valve regurgitation.  No evidence of mitral stenosis. Moderate mitral annular calcification.   4. Calcified AV with small gradient but AVA 2.3 cm2. The aortic valve is  normal in structure. There is moderate calcification of the aortic valve.  There is moderate thickening of the aortic valve. Aortic valve  regurgitation is not visualized. Aortic  valve sclerosis/calcification is present, without any evidence of aortic  stenosis.   5. The inferior vena cava is normal in size with greater than 50%  respiratory variability, suggesting right atrial pressure of 3 mmHg.   Patient Profile     87 y.o. female with a hx of HTN, hypothyroidism, HLD, stroke, dementia (advancing) admitted to [facility] 02/15/2021 with episode/event that was observed by her family and appeared to be seizure like to them.  She was evaluated found with UTI, unable to do MRI 2/2 spine stimulator, neurology felt perhaps infection provoked seizure, with no recurrent events, planned for d/c to SNF. 02/24/20 AM she had a few very long episodes of V standstill/CHB and emergent temp pacing wire was placed. Intubated  Remains intubated, unable to wean per RN 2/2 agitation it seems Still on low dose levo  Assessment & Plan    Ventricular standstill CHB Recurrent long pauses associated with syncope (seizure-like activity) No nodal blocking agents  at home or here  In d/w family  yesterday, they are agreeable to PPM implant only if she has meaningful recovery, able to be extubated, off pressors with intact neuro  Family yesterday made partial code, noes not want CPR/defib/ACLS.  Ok to continue current level of care  She had some SVT yesterday, perhaps 2/2 meds, agitation She is in SB 40's with 1st degree AVBlock under the pacer this AM Pacing rate reduced to 30bpm this AM to allow AV synchrony  Dr. Lovena Le has seen and examined the patient this AM Continue care with attending cardiology team EP will continue to follow  For questions or updates, please contact [facility] Please consult [email or web address] for contact info under   Signed, Baldwin Jamaica, PA-C  02/25/2021, 10:31 AM    EP attending  Patient seen and examined.  Agree with the findings as noted above.  She remains intubated.  I turned her temporary pacemaker down to 30 and she had underlying sinus rhythm with a sinus rate of 45 to 50 bpm.  She remains intubated and sedated.  The rest of her exam demonstrates a regular rate and rhythm with clear lungs and warm extremities. Assessment and plan 1.  Complete heart block -I have discussed the issues with the family.  She has an indication for permanent pacemaker insertion.  There is an underlying question about the patient's level of mental functioning.  The family would like to allow her to come off the ventilator and reassess her severity of her memory issues to see if she is able to reasonably get back to her baseline.  Specifically they do not recommend pacemaker for if her neurocognitive function has declined significantly.   Cristopher Peru, MD

## 2021-02-25 NOTE — Progress Notes (Signed)
Attempted vent wean on cpap/psv.  Pt had apnea episodes x 2. Pt placed back on full vent support.   Also, while repositioning ETT holder (moving from right lip to center) noted pressure/edema area on right side upper lip.  There was a scab that the family member pulled off.  I educated family member to not pull skin/scabs off pt lips d/t concerns for bleeding and infection.  All of the above was reported to RN.

## 2021-02-25 NOTE — Progress Notes (Signed)
Initial Nutrition Assessment  DOCUMENTATION CODES:   Not applicable  INTERVENTION:   Tube Feeding via OG: Osmolite 1.2 at 55 ml/hr Pro-Source TF 45 mL daily Provides 84 g of protein, 1624 kcals and 1069 mL of free water   NUTRITION DIAGNOSIS:   Inadequate oral intake related to acute illness as evidenced by NPO status.  GOAL:   Patient will meet greater than or equal to 90% of their needs   MONITOR:   Vent status, Labs, Weight trends, TF tolerance  REASON FOR ASSESSMENT:   Consult    ASSESSMENT:   87 yo female admitted on 12/30 with syncopal episode with possible seizure like activity.  Pt had PEA arrest with complete heart block on 01/02 requiring intubation. PMH includes GERD, dementia, gait abnormality, CVA, HTN, PUD, DM  12/30 Admitted 01/02 PEA arrest, brief CPR, Intubated  Pt remains on vent support, failed weaning trial today. Precedex for sedation, weaning levophed. Noted possible plan for PPM once extubated, off pressors with intact neuro status  OG tube in stomach per abd xray  Unable to obtain diet and weight history from patient at this time. Pt on Heart Healthy diet from 12/31 until 1/02 but no recorded po intake.   Labs:  reviewed Meds: ss novolog, miralax, colace  Diet Order:   Diet Order             Diet NPO time specified  Diet effective now                   EDUCATION NEEDS:   Not appropriate for education at this time  Skin:  Skin Assessment: Reviewed RN Assessment  Last BM:  1/01  Height:   Ht Readings from Last 1 Encounters:  03/05/2021 5\' 4"  (1.626 m)    Weight:   Wt Readings from Last 1 Encounters:  02/25/21 79.7 kg    BMI:  Body mass index is 30.16 kg/m.  Estimated Nutritional Needs:   Kcal:  1550-1700 kcals  Protein:  75-85 g  Fluid:  >/= 1.5 L   Kerman Passey MS, RDN, LDN, CNSC Registered Dietitian III Clinical Nutrition RD Pager and On-Call Pager Number Located in [place]

## 2021-02-26 DIAGNOSIS — B9689 Other specified bacterial agents as the cause of diseases classified elsewhere: Secondary | ICD-10-CM

## 2021-02-26 DIAGNOSIS — I469 Cardiac arrest, cause unspecified: Secondary | ICD-10-CM | POA: Diagnosis not present

## 2021-02-26 DIAGNOSIS — J9601 Acute respiratory failure with hypoxia: Secondary | ICD-10-CM

## 2021-02-26 DIAGNOSIS — R55 Syncope and collapse: Secondary | ICD-10-CM | POA: Diagnosis not present

## 2021-02-26 LAB — GLUCOSE, CAPILLARY
Glucose-Capillary: 130 mg/dL — ABNORMAL HIGH (ref 70–99)
Glucose-Capillary: 139 mg/dL — ABNORMAL HIGH (ref 70–99)
Glucose-Capillary: 157 mg/dL — ABNORMAL HIGH (ref 70–99)
Glucose-Capillary: 157 mg/dL — ABNORMAL HIGH (ref 70–99)
Glucose-Capillary: 161 mg/dL — ABNORMAL HIGH (ref 70–99)
Glucose-Capillary: 203 mg/dL — ABNORMAL HIGH (ref 70–99)

## 2021-02-26 LAB — CBC
HCT: 27.6 % — ABNORMAL LOW (ref 36.0–46.0)
Hemoglobin: 8.6 g/dL — ABNORMAL LOW (ref 12.0–15.0)
MCH: 32.5 pg (ref 26.0–34.0)
MCHC: 31.2 g/dL (ref 30.0–36.0)
MCV: 104.2 fL — ABNORMAL HIGH (ref 80.0–100.0)
Platelets: 143 10*3/uL — ABNORMAL LOW (ref 150–400)
RBC: 2.65 MIL/uL — ABNORMAL LOW (ref 3.87–5.11)
RDW: 12.9 % (ref 11.5–15.5)
WBC: 8 10*3/uL (ref 4.0–10.5)
nRBC: 0 % (ref 0.0–0.2)

## 2021-02-26 LAB — MAGNESIUM
Magnesium: 2 mg/dL (ref 1.7–2.4)
Magnesium: 2.1 mg/dL (ref 1.7–2.4)

## 2021-02-26 LAB — BASIC METABOLIC PANEL
Anion gap: 8 (ref 5–15)
BUN: 29 mg/dL — ABNORMAL HIGH (ref 8–23)
CO2: 20 mmol/L — ABNORMAL LOW (ref 22–32)
Calcium: 8.4 mg/dL — ABNORMAL LOW (ref 8.9–10.3)
Chloride: 109 mmol/L (ref 98–111)
Creatinine, Ser: 1.22 mg/dL — ABNORMAL HIGH (ref 0.44–1.00)
GFR, Estimated: 44 mL/min — ABNORMAL LOW (ref >60)
Glucose, Bld: 171 mg/dL — ABNORMAL HIGH (ref 70–99)
Potassium: 3.7 mmol/L (ref 3.5–5.1)
Sodium: 137 mmol/L (ref 135–145)

## 2021-02-26 LAB — PHOSPHORUS
Phosphorus: 3.2 mg/dL (ref 2.5–4.6)
Phosphorus: 3.9 mg/dL (ref 2.5–4.6)

## 2021-02-26 MED ORDER — POTASSIUM CHLORIDE 20 MEQ PO PACK
40.0000 meq | PACK | Freq: Once | ORAL | Status: AC
  Administered 2021-02-26: 40 meq
  Filled 2021-02-26: qty 2

## 2021-02-26 MED ORDER — ENOXAPARIN SODIUM 40 MG/0.4ML IJ SOSY
40.0000 mg | PREFILLED_SYRINGE | INTRAMUSCULAR | Status: DC
  Administered 2021-02-27: 40 mg via SUBCUTANEOUS
  Filled 2021-02-26: qty 0.4

## 2021-02-26 MED ORDER — CLONAZEPAM 1 MG PO TABS
1.0000 mg | ORAL_TABLET | Freq: Two times a day (BID) | ORAL | Status: DC
  Administered 2021-02-26 – 2021-02-27 (×3): 1 mg
  Filled 2021-02-26 (×3): qty 1

## 2021-02-26 NOTE — Progress Notes (Signed)
Stable from cardiac standpoint, I checked her pacemaker, capture at 0.7 V.  Pacer rate turned down to 40 on demand.  Patient has remained stable from arrhythmia standpoint and heart block standpoint.  Sedation has been turned off, awaiting for medical stabilization prior to proceeding with pacemaker implantation.   [name], MD, [facility] 02/26/2021, 8:36 AM Office: [phone] Fax: [phone] Pager: [phone]

## 2021-02-26 NOTE — Plan of Care (Signed)
°  Problem: Education: Goal: Knowledge of disease or condition will improve Outcome: Progressing Goal: Knowledge of secondary prevention will improve (SELECT ALL) Outcome: Progressing Goal: Knowledge of patient specific risk factors will improve (INDIVIDUALIZE FOR PATIENT) Outcome: Progressing Goal: Individualized Educational Video(s) Outcome: Progressing   Problem: Education: Goal: Knowledge of General Education information will improve Description: Including pain rating scale, medication(s)/side effects and non-pharmacologic comfort measures Outcome: Progressing   Problem: Health Behavior/Discharge Planning: Goal: Ability to manage health-related needs will improve Outcome: Progressing   Problem: Clinical Measurements: Goal: Ability to maintain clinical measurements within normal limits will improve Outcome: Progressing Goal: Will remain free from infection Outcome: Progressing Goal: Diagnostic test results will improve Outcome: Progressing Goal: Respiratory complications will improve Outcome: Progressing Goal: Cardiovascular complication will be avoided Outcome: Progressing   Problem: Activity: Goal: Risk for activity intolerance will decrease Outcome: Progressing   Problem: Nutrition: Goal: Adequate nutrition will be maintained Outcome: Progressing   Problem: Coping: Goal: Level of anxiety will decrease Outcome: Progressing   Problem: Elimination: Goal: Will not experience complications related to bowel motility Outcome: Progressing Goal: Will not experience complications related to urinary retention Outcome: Progressing   Problem: Pain Managment: Goal: General experience of comfort will improve Outcome: Progressing   Problem: Safety: Goal: Ability to remain free from injury will improve Outcome: Progressing   Problem: Skin Integrity: Goal: Risk for impaired skin integrity will decrease Outcome: Progressing   

## 2021-02-26 NOTE — Progress Notes (Addendum)
Progress Note  Patient Name: [name] Date of Encounter: 02/26/2021  [place] Cardiologist: Peter Martinique, MD   Subjective   Intubated, off pressor, off sedation  Inpatient Medications    Scheduled Meds:   stroke: mapping our early stages of recovery book   Does not apply Once   chlorhexidine gluconate (MEDLINE KIT)  15 mL Mouth Rinse BID   Chlorhexidine Gluconate Cloth  6 each Topical Daily   docusate  100 mg Per Tube BID   donepezil  10 mg Per Tube QHS   enoxaparin (LOVENOX) injection  30 mg Subcutaneous Q24H   feeding supplement (PROSource TF)  45 mL Per Tube Daily   fentaNYL (SUBLIMAZE) injection  25 mcg Intravenous Once   insulin aspart  0-9 Units Subcutaneous Q4H   lamoTRIgine  150 mg Per Tube BID   levothyroxine  100 mcg Per Tube Daily   mouth rinse  15 mL Mouth Rinse 10 times per day   polyethylene glycol  17 g Per Tube Daily   QUEtiapine  25 mg Per Tube QHS   sodium chloride flush  3 mL Intravenous Q12H   Continuous Infusions:  sodium chloride 10 mL/hr at 02/26/21 0755   ceFEPime (MAXIPIME) IV 2 g (02/26/21 0842)   dexmedetomidine (PRECEDEX) IV infusion Stopped (02/26/21 0736)   famotidine (PEPCID) IV Stopped (02/25/21 1357)   feeding supplement (OSMOLITE 1.2 CAL) 1,000 mL (02/25/21 2229)   fentaNYL infusion INTRAVENOUS Stopped (02/25/21 1026)   norepinephrine (LEVOPHED) Adult infusion Stopped (02/25/21 1826)   PRN Meds: acetaminophen **OR** acetaminophen (TYLENOL) oral liquid 160 mg/5 mL **OR** acetaminophen, fentaNYL, fentaNYL (SUBLIMAZE) injection, ondansetron (ZOFRAN) IV, senna-docusate   Vital Signs    Vitals:   02/26/21 0730 02/26/21 0742 02/26/21 0752 02/26/21 0800  BP: 129/63     Pulse: (!) 40  (!) 42 (!) 44  Resp: (!) 22  20 (!) 22  Temp:  97.9 F (36.6 C)    TempSrc:  Oral    SpO2: 95%  99% 95%  Weight:      Height:        Intake/Output Summary (Last 24 hours) at 02/26/2021 0842 Last data filed at 02/26/2021 0755 Gross per 24 hour   Intake 1345.03 ml  Output 315 ml  Net 1030.03 ml   Last 3 Weights 02/26/2021 02/25/2021 03/19/2021  Weight (lbs) 178 lb 9.2 oz 175 lb 11.3 oz 180 lb 8.9 oz  Weight (kg) 81 kg 79.7 kg 81.9 kg      Telemetry    Asynchronous pacing - Personally Reviewed  ECG    No new EKGs - Personally Reviewed  Physical Exam   GEN: intubated does not follow comands Neck: No JVD Cardiac: RRR, no murmurs, rubs, or gallops.  Respiratory: CTA b/l. GI: Soft  MS: No edema; No deformity. Neuro:  unable to assess  Psych: unable to assess   Labs    High Sensitivity Troponin:   Recent Labs  Lab 03/06/2021 1138 03/03/2021 1154  TROPONINIHS 223* 374*     Chemistry Recent Labs  Lab 02/05/2021 1917 02/22/21 0106 02/22/21 0106 02/25/2021 1138 02/22/2021 1208 03/01/2021 0907 02/25/21 0223 02/25/21 1737 02/25/21 2015 02/26/21 0510  NA 135 135  --  136   < > 135 135 137  --   --   K 4.2 4.5  --  3.6   < > 3.7 3.8 4.3  --   --   CL 100 104  --  101  --  103 106  --   --   --  CO2 28 23  --  24  --  22 20*  --   --   --   GLUCOSE 198* 112*  --  255*  --  161* 132*  --   --   --   BUN 20 15  --  14  --  12 17  --   --   --   CREATININE 1.14* 0.93  --  1.03*  --  1.16* 1.29*  --   --   --   CALCIUM 9.1 8.6*  --  8.5*  --  8.6* 8.3*  --   --   --   MG  --   --    < > 1.6*  --  1.6* 2.1  --  1.9 2.0  PROT 7.1 6.3*  --   --   --  6.1*  --   --   --   --   ALBUMIN 3.6 3.3*  --   --   --  3.2*  --   --   --   --   AST 15 19  --   --   --  31  --   --   --   --   ALT 12 9  --   --   --  28  --   --   --   --   ALKPHOS 54 40  --   --   --  50  --   --   --   --   BILITOT 0.5 0.9  --   --   --  1.1  --   --   --   --   GFRNONAA 47* >60  --  54*  --  46* 41*  --   --   --   ANIONGAP 7 8  --  11  --  10 9  --   --   --    < > = values in this interval not displayed.    Lipids  Recent Labs  Lab 02/21/21 0500 03/17/2021 0553  CHOL 204*  --   TRIG 112 127  HDL 42  --   LDLCALC 140*  --   CHOLHDL 4.9  --      Hematology Recent Labs  Lab 03/18/2021 1138 03/10/2021 1208 03/09/2021 0907 02/25/21 0223 02/25/21 1737  WBC 16.5*  --  11.1* 8.9  --   RBC 3.79*  --  3.76* 3.25*  --   HGB 12.7   < > 12.6 10.8* 10.2*  HCT 38.4   < > 37.7 32.3* 30.0*  MCV 101.3*  --  100.3* 99.4  --   MCH 33.5  --  33.5 33.2  --   MCHC 33.1  --  33.4 33.4  --   RDW 12.4  --  12.8 12.7  --   PLT 261  --  245 180  --    < > = values in this interval not displayed.   Thyroid  Recent Labs  Lab 02/21/21 0630  TSH 1.430    BNPNo results for input(s): BNP, PROBNP in the last 168 hours.  DDimer No results for input(s): DDIMER in the last 168 hours.   Radiology      Cardiac Studies   02/21/21: TTE IMPRESSIONS   1. Left ventricular ejection fraction, by estimation, is 60 to 65%. The  left ventricle has normal function. The left ventricle has no regional  wall motion abnormalities. There is mild left ventricular  hypertrophy.  Left ventricular diastolic parameters  are consistent with Grade I diastolic dysfunction (impaired relaxation).   2. Right ventricular systolic function is normal. The right ventricular  size is normal.   3. The mitral valve is degenerative. Trivial mitral valve regurgitation.  No evidence of mitral stenosis. Moderate mitral annular calcification.   4. Calcified AV with small gradient but AVA 2.3 cm2. The aortic valve is  normal in structure. There is moderate calcification of the aortic valve.  There is moderate thickening of the aortic valve. Aortic valve  regurgitation is not visualized. Aortic  valve sclerosis/calcification is present, without any evidence of aortic  stenosis.   5. The inferior vena cava is normal in size with greater than 50%  respiratory variability, suggesting right atrial pressure of 3 mmHg.   Patient Profile     87 y.o. female with a hx of HTN, hypothyroidism, HLD, stroke, dementia (advancing) admitted [facility]/11/2021 with episode/event that was observed by her  family and appeared to be seizure like to them.  She was evaluated found with UTI, unable to do MRI 2/2 spine stimulator, neurology felt perhaps infection provoked seizure, with no recurrent events, planned for d/c to SNF. 02/24/20 AM she had a few very long episodes of V standstill/CHB and emergent temp pacing wire was placed. Intubated   Assessment & Plan    Ventricular standstill CHB Recurrent long pauses associated with syncope (seizure-like activity) No nodal blocking agents at home or here Temp wire placed  In d/w family 02/25/20, they are agreeable to PPM implant only if she has meaningful recovery, able to be extubated, off pressors with intact neuro  Family 02/25/20 made partial code, does not want CPR/defib/ACLS.  Ok to continue current level of care  She had some SVT suspect 2/2 meds, agitation Maintaining SB 40's-50 with 1st degree AVblock Back up pacing at 30 with infrequent single paced beats Off pressor  Off sedation though not following commands for nursing   Dr. Lovena Le has seen and examined the patient this AM Continue care with attending cardiology team   EP will step back, would keep temp wire until clinical trajectory is clear Please recall if needed   For questions or updates, please contact [facility] Please consult [email or web address] for contact info under   Signed, Baldwin Jamaica, PA-C  02/26/2021, 8:42 AM    EP Attending  Patient seen and examined. Agree with the findings as noted above. The patient is improved with a return of her AV conduction. Her neuro status and ability to come off of the ventilator are still poor. We will await these improvements. Her temporary wire could be removed tomorrow if no pacing required. If she wakes up and can be extubated PPM next week if her family consents.  Carleene Overlie Janise Gora,MD

## 2021-02-26 NOTE — TOC Progression Note (Addendum)
Transition of Care [facility]) - Progression Note    Patient Details  Name: [name] MRN: [id number] Date of Birth: October 14, 1934  Transition of Care [facility]) CM/SW [place], [place] Phone Number: 02/26/2021, 5:58 PM  Clinical Narrative:     CSW continues to follow patient. CSW will follow up with family on snf choice when appropriate..CSW will continue to follow and assist with patients dc planning needs.  Expected Discharge Plan: [place] Barriers to Discharge: Continued Medical Work up, SNF Pending bed offer  Expected Discharge Plan and Services Expected Discharge Plan: [place] Choice: [place] arrangements for the past 2 months: Single Family Home                                       Social Determinants of Health (SDOH) Interventions    Readmission Risk Interventions No flowsheet data found.

## 2021-02-26 NOTE — Progress Notes (Signed)
ETT holder changed with the help of the RN. Pt tolerated well.

## 2021-02-26 NOTE — Progress Notes (Signed)
°   02/26/21 1030  Clinical Encounter Type  Visited With Other (Comment)  Visit Type Follow-up;Spiritual support     CH requested Fr. Eric from [facility] come to administer anointing of the sick per family request yesterday.  Fr. Randall Hiss says he will visit this afternoon.  Chaplains remain available as needed.  Lindaann Pascal, Chaplain Pager: [phone]

## 2021-02-26 NOTE — Progress Notes (Signed)
°   02/26/21 1500  Clinical Encounter Type  Visited With Other (Comment)  Visit Type Follow-up  Referral From Nurse  Consult/Referral To Chaplain   Chaplain Jorene Guest received page. Upon entering unit, the attending nurse Janett Billow provided an update and said the patient's son requested to speak with the chaplain. The nurse called the patient's daughter; however, the son was not in the hospital. Advised chaplain remain available for follow up spiritual/emotional support as needed. This note was prepared by Jeanine Luz, M.Div..  For questions please contact by phone [phone].

## 2021-02-26 NOTE — Progress Notes (Signed)
PT Cancellation Note  Patient Details Name: [name] MRN: [id number] DOB: [birth year]   Cancelled Treatment:    Reason Eval/Treat Not Completed: Medical issues which prohibited therapy at this time. Per RN, the pt remains intubated and sedated at this time and requests that therapies continue to hold. Will continue to follow from a distance and re-evaluate when medically appropriate.   [place], PT, DPT   Acute Rehabilitation Department Pager #: [phone]   [name] 02/26/2021, 2:48 PM

## 2021-02-26 NOTE — Progress Notes (Signed)
NAME:  [name], MRN:  [id number], DOB:  27-Dec-1934, LOS: 4 ADMISSION DATE:  02/15/2021, CONSULTATION DATE:  03/04/2021 REFERRING MD:  TRH, CHIEF COMPLAINT:  syncope   History of Present Illness:  87 year old woman with hx of hypothyroidism, prior CVA, dementia who presented with syncopal episode with possible seizure like activity at home on 01/27/2021.  Workup so far has revealed grade 1 diastolic dysfunction, no evidence of ongoing seizures, possible UTI.  Was being worked up for SNF placement but this AM became more somnolent then unresponsive.  Son at bedside reversed DNR status.  PEA arrest and given CPR, brief, woke back up but not protecting airway, gurgling respirations.  Intubated by anesthesia.  Pertinent  Medical History  GERD Dementia Prior cerebellar infarct DM2 PMR HTN Hypothyroidism PUD  Significant Hospital Events: Including procedures, antibiotic start and stop dates in addition to other pertinent events   02/10/2021 admitted 03/05/2021 code and temporary PM placed 03/01/2021 SVT s/p adenosine x 2. Increased levop support. After this Code status changed to DNR (no chest compressions/shocks) per family 1/5 No acute events overnight, will open eyes to verbal stimuli but sluggish   Interim History / Subjective:  No events overnight   Objective   Blood pressure 129/63, pulse (!) 44, temperature 97.9 F (36.6 C), temperature source Oral, resp. rate (!) 22, height 5\' 4"  (1.626 m), weight 81 kg, SpO2 95 %.    Vent Mode: PRVC FiO2 (%):  [30 %-40 %] 30 % Set Rate:  [22 bmp] 22 bmp Vt Set:  [430 mL] 430 mL PEEP:  [5 cmH20] 5 cmH20 Plateau Pressure:  [13 cmH20-15 cmH20] 14 cmH20   Intake/Output Summary (Last 24 hours) at 02/26/2021 0908 Last data filed at 02/26/2021 0755 Gross per 24 hour  Intake 1345.03 ml  Output 315 ml  Net 1030.03 ml    Filed Weights   03/08/2021 0412 02/25/21 0500 02/26/21 0500  Weight: 81.9 kg 79.7 kg 81 kg   Physical Exam: General: Acute on  chronically ill appearing elderly female lying in bed  on mechanical ventilation, in NAD HEENT: ETT, MM pink/moist, PERRL,  Neuro: Will open eyes to verbal stimuli, sluggish, on no sedation currently  CV: s1s2 regular rate and rhythm, no murmur, rubs, or gallops,  PULM:  Clear to ascultation, no increased work for breathing, no added breath sounds GI: soft, bowel sounds active in all 4 quadrants, non-tender, non-distended, Extremities: warm/dry, no edema  Skin: no rashes or lesions  Resolved Hospital Problem list   Hypotension   Assessment & Plan:  In hospital cardiac arrest secondary to CHB s/p temp PM 1/2  -Per Cardiology - 02/25/20, family is agreeable to PPM implant only if she has meaningful recovery, able to be extubated, off pressors with intact neuro P: Cardiology and EP continue to follow  Continue transvenous pacemaker for now to follow clinical recovery  Continuous telemetry  Strict intake and output  Closely monitor renal function and electrolytes   Acute encephalopathy secondary to cardiac arrest  - EEG neg. Mental status remains concerning. May evaluate with MRI however will need to investigate bladder stimulator if compatible with MRI -EEG negative  P: Await further information on implanted simulator prior to MRI Maintain neuro protective measures; goal for eurothermia, euglycemia, eunatermia, normoxia, and PCO2 goal of 35-40 Nutrition and bowel regiment  Seizure precautions  Aspirations precautions   Acute hypoxemic respiratory failure  -due to inability to protect airway and likely aspiration. P: Continue ventilator support with lung  protective strategies  Wean PEEP and FiO2 for sats greater than 90%. Head of bed elevated 30 degrees. Plateau pressures less than 30 cm H20.  Follow intermittent chest x-ray and ABG.   SAT/SBT as tolerated, mentation preclude extubation  Ensure adequate pulmonary hygiene  Follow cultures  VAP bundle in place  PAD  protocol  Klebsiella UTI -Urine culture positive for > 100,000 Klebsiella  P: Currently on IV Cefepime, planned for a total of 14 doses Per ID pharmacy given resistance seen on sensitivities would not reccommended deescalation   Multiple comorbidities include GERD, PMR, CVA, dementia, gait abnormality P: Supportive care    Prior DNR- rescinded in context of arrest. Now currently DNR since 1/3  Best Practice (right click and "Reselect all SmartList Selections" daily)   Diet/type: tubefeeds DVT prophylaxis: LMWH GI prophylaxis: pepcid Lines: N/A Foley:  Yes, and it is still needed Code Status:  limited No chest compressions or shocks Last date of multidisciplinary goals of care discussion [pending]  Critical care time:   CRITICAL CARE Performed by: Llana Deshazo D. Harris  Total critical care time: 38 minutes  Critical care time was exclusive of separately billable procedures and treating other patients.  Critical care was necessary to treat or prevent imminent or life-threatening deterioration.  Critical care was time spent personally by me on the following activities: development of treatment plan with patient and/or surrogate as well as nursing, discussions with consultants, evaluation of patient's response to treatment, examination of patient, obtaining history from patient or surrogate, ordering and performing treatments and interventions, ordering and review of laboratory studies, ordering and review of radiographic studies, pulse oximetry and re-evaluation of patient's condition.  Abhijot Straughter D. Kenton Kingfisher, NP-C [facility] Personal contact information can be found on Amion  02/26/2021, 9:23 AM

## 2021-02-27 ENCOUNTER — Telehealth: Payer: Self-pay

## 2021-02-27 DIAGNOSIS — I442 Atrioventricular block, complete: Secondary | ICD-10-CM | POA: Diagnosis not present

## 2021-02-27 DIAGNOSIS — I469 Cardiac arrest, cause unspecified: Secondary | ICD-10-CM | POA: Diagnosis not present

## 2021-02-27 DIAGNOSIS — R55 Syncope and collapse: Secondary | ICD-10-CM | POA: Diagnosis not present

## 2021-02-27 DIAGNOSIS — J9601 Acute respiratory failure with hypoxia: Secondary | ICD-10-CM | POA: Diagnosis not present

## 2021-02-27 LAB — TRIGLYCERIDES: Triglycerides: 117 mg/dL (ref <150)

## 2021-02-27 LAB — BASIC METABOLIC PANEL
Anion gap: 6 (ref 5–15)
BUN: 35 mg/dL — ABNORMAL HIGH (ref 8–23)
CO2: 20 mmol/L — ABNORMAL LOW (ref 22–32)
Calcium: 8.8 mg/dL — ABNORMAL LOW (ref 8.9–10.3)
Chloride: 110 mmol/L (ref 98–111)
Creatinine, Ser: 1.33 mg/dL — ABNORMAL HIGH (ref 0.44–1.00)
GFR, Estimated: 39 mL/min — ABNORMAL LOW (ref >60)
Glucose, Bld: 185 mg/dL — ABNORMAL HIGH (ref 70–99)
Potassium: 4.2 mmol/L (ref 3.5–5.1)
Sodium: 136 mmol/L (ref 135–145)

## 2021-02-27 LAB — GLUCOSE, CAPILLARY
Glucose-Capillary: 165 mg/dL — ABNORMAL HIGH (ref 70–99)
Glucose-Capillary: 144 mg/dL — ABNORMAL HIGH (ref 70–99)
Glucose-Capillary: 217 mg/dL — ABNORMAL HIGH (ref 70–99)
Glucose-Capillary: 120 mg/dL — ABNORMAL HIGH (ref 70–99)

## 2021-02-27 LAB — CBC
HCT: 34.4 % — ABNORMAL LOW (ref 36.0–46.0)
Hemoglobin: 10.7 g/dL — ABNORMAL LOW (ref 12.0–15.0)
MCH: 32.2 pg (ref 26.0–34.0)
MCHC: 31.1 g/dL (ref 30.0–36.0)
MCV: 103.6 fL — ABNORMAL HIGH (ref 80.0–100.0)
Platelets: 238 10*3/uL (ref 150–400)
RBC: 3.32 MIL/uL — ABNORMAL LOW (ref 3.87–5.11)
RDW: 13.1 % (ref 11.5–15.5)
WBC: 12.1 10*3/uL — ABNORMAL HIGH (ref 4.0–10.5)
nRBC: 0 % (ref 0.0–0.2)

## 2021-02-27 LAB — PHOSPHORUS: Phosphorus: 3.5 mg/dL (ref 2.5–4.6)

## 2021-02-27 LAB — MAGNESIUM: Magnesium: 2 mg/dL (ref 1.7–2.4)

## 2021-02-27 MED ORDER — SCOPOLAMINE 1 MG/3DAYS TD PT72
1.0000 | MEDICATED_PATCH | TRANSDERMAL | Status: DC
  Administered 2021-02-27: 1.5 mg via TRANSDERMAL
  Filled 2021-02-27: qty 1

## 2021-02-27 MED ORDER — FUROSEMIDE 10 MG/ML IJ SOLN
40.0000 mg | Freq: Once | INTRAMUSCULAR | Status: AC
  Administered 2021-02-27: 40 mg via INTRAVENOUS
  Filled 2021-02-27: qty 4

## 2021-02-27 MED ORDER — ALBUTEROL SULFATE (2.5 MG/3ML) 0.083% IN NEBU
2.5000 mg | INHALATION_SOLUTION | RESPIRATORY_TRACT | Status: AC
  Administered 2021-02-27: 2.5 mg via RESPIRATORY_TRACT
  Filled 2021-02-27: qty 3

## 2021-02-27 MED ORDER — MORPHINE SULFATE (PF) 2 MG/ML IV SOLN
2.0000 mg | INTRAVENOUS | Status: DC | PRN
  Administered 2021-02-27 (×2): 2 mg via INTRAVENOUS
  Filled 2021-02-27: qty 1

## 2021-02-27 MED ORDER — MORPHINE SULFATE (PF) 2 MG/ML IV SOLN
2.0000 mg | INTRAVENOUS | Status: DC | PRN
  Administered 2021-02-27 (×2): 2 mg via INTRAVENOUS
  Filled 2021-02-27 (×3): qty 1

## 2021-02-27 MED ORDER — ALBUTEROL SULFATE (2.5 MG/3ML) 0.083% IN NEBU: 2.5000 mg | INHALATION_SOLUTION | RESPIRATORY_TRACT | Status: DC | PRN

## 2021-02-28 LAB — CULTURE, BLOOD (ROUTINE X 2)
Culture: NO GROWTH
Culture: NO GROWTH
Special Requests: ADEQUATE

## 2021-03-02 ENCOUNTER — Other Ambulatory Visit: Payer: Self-pay | Admitting: *Deleted

## 2021-03-02 NOTE — Patient Outreach (Signed)
[facility]) Care Management  03/02/2021  [name] 12-22-1934 [id number]   Pt is deceased and case is now closed.  Raina Mina, RN Care Management Coordinator [place] Office [phone]

## 2021-03-06 MED FILL — Medication: Qty: 1 | Status: AC

## 2021-03-16 ENCOUNTER — Ambulatory Visit: Payer: Medicare Other | Admitting: *Deleted

## 2021-03-25 NOTE — Progress Notes (Signed)
OT Cancellation Note  Patient Details Name: [name] MRN: [id number] DOB: 07-08-1934   Cancelled Treatment:    Reason Eval/Treat Not Completed: Medical issues which prohibited therapy (Pt remains intubated, RN recommending to check back next day.)  Malka So 03-14-21, 8:53 AM Nestor Lewandowsky, OTR/L Acute Rehabilitation Services Pager: [phone] Office: [phone]

## 2021-03-25 NOTE — Progress Notes (Incomplete)
Subjective:  ***  Objective:  Vital Signs in the last 24 hours: Temp:  [96.8 F (36 C)-98.1 F (36.7 C)] 96.8 F (36 C) (01/06 1113) Pulse Rate:  [40-107] 107 (01/06 1200) Resp:  [16-41] 41 (01/06 1200) BP: (87-165)/(42-129) 165/129 (01/06 1200) SpO2:  [95 %-100 %] 95 % (01/06 1200) FiO2 (%):  [30 %] 30 % (01/06 0811) Weight:  [81.6 kg] 81.6 kg (01/06 0500)  Intake/Output from previous day: 01/05 0701 - 01/06 0700 In: 1582.4 [I.V.:437.4; NG/GT:895; IV Piggyback:250] Out: 460 [Urine:460]  Physical Exam   Lab Results: BMP Recent Labs    02/25/21 0223 02/25/21 1737 02/26/21 1120 17-Mar-2021 1206  NA 135 137 137 136  K 3.8 4.3 3.7 4.2  CL 106  --  109 110  CO2 20*  --  20* 20*  GLUCOSE 132*  --  171* 185*  BUN 17  --  29* 35*  CREATININE 1.29*  --  1.22* 1.33*  CALCIUM 8.3*  --  8.4* 8.8*  GFRNONAA 41*  --  44* 39*    CBC Recent Labs  Lab 02/03/2021 1917 02/22/21 0106 03/17/2021 1206  WBC 8.3   < > 12.1*  RBC 4.00   < > 3.32*  HGB 13.0   < > 10.7*  HCT 40.9   < > 34.4*  PLT 250   < > 238  MCV 102.3*   < > 103.6*  MCH 32.5   < > 32.2  MCHC 31.8   < > 31.1  RDW 12.6   < > 13.1  LYMPHSABS 1.9  --   --   MONOABS 0.7  --   --   EOSABS 0.2  --   --   BASOSABS 0.0  --   --    < > = values in this interval not displayed.    HEMOGLOBIN A1C Lab Results  Component Value Date   HGBA1C 5.7 (H) 02/21/2021   MPG 116.89 02/21/2021    Cardiac Panel (last 3 results) No results for input(s): CKTOTAL, CKMB, TROPONINI, RELINDX in the last 8760 hours.  BNP (last 3 results) No results for input(s): BNP in the last 8760 hours.  TSH Recent Labs    02/21/21 0630  TSH 1.430    Lipid Panel     Component Value Date/Time   CHOL 204 (H) 02/21/2021 0500   TRIG 117 March 17, 2021 0231   HDL 42 02/21/2021 0500   CHOLHDL 4.9 02/21/2021 0500   VLDL 22 02/21/2021 0500   LDLCALC 140 (H) 02/21/2021 0500     Hepatic Function Panel Recent Labs    02/14/2021 1917  02/22/21 0106 02/25/2021 0907  PROT 7.1 6.3* 6.1*  ALBUMIN 3.6 3.3* 3.2*  AST 15 19 31   ALT 12 9 28   ALKPHOS 54 40 50  BILITOT 0.5 0.9 1.1      Cardiac Studies:  EKG 03/22/2021: Sinus tachycardia Left axis deviation Right bundle branch block Inferior infarct , age undetermined Cannot rule out Anteroseptal infarct , age undetermined  Echocardiogram 02/21/2021:  1. Left ventricular ejection fraction, by estimation, is 60 to 65%. The  left ventricle has normal function. The left ventricle has no regional  wall motion abnormalities. There is mild left ventricular hypertrophy.  Left ventricular diastolic parameters  are consistent with Grade I diastolic dysfunction (impaired relaxation).   2. Right ventricular systolic function is normal. The right ventricular  size is normal.   3. The mitral valve is degenerative. Trivial mitral valve regurgitation.  No evidence of mitral  stenosis. Moderate mitral annular calcification.   4. Calcified AV with small gradient but AVA 2.3 cm2. The aortic valve is  normal in structure. There is moderate calcification of the aortic valve.  There is moderate thickening of the aortic valve. Aortic valve  regurgitation is not visualized. Aortic  valve sclerosis/calcification is present, without any evidence of aortic  stenosis.   5. The inferior vena cava is normal in size with greater than 50%  respiratory variability, suggesting right atrial pressure of 3 mmHg.   Assessment & Recommendations:  87 year old Caucasian female with hypothyroidism,h/o stroke, dementia, admitted with PEA arrest 2/2 complete heart block  CHB: Currently has temp pacer in place *** ***    Nigel Mormon, MD Pager: [phone] Office: [phone]

## 2021-03-25 NOTE — Discharge Summary (Signed)
DEATH SUMMARY   Patient Details  Name: [name] MRN: [id number] DOB: [birth year]  Admission/Discharge Information   Admit Date:  March 10, 2021  Date of Death: Date of Death: 03/17/21  Time of Death: Time of Death: 05/26/1950  Length of Stay: 5  Referring Physician: Shon Baton, MD   Reason(s) for Hospitalization  Complete heart block.  Diagnoses  Preliminary cause of death: complete heart block.  Secondary Diagnoses (including complications and co-morbidities):  Principal Problem:   Syncope Active Problems:   Hypertension   TIA (transient ischemic attack)   AKI (acute kidney injury) ([place])   Dementia without behavioral disturbance (HCC)   Convulsions (HCC)   Seizure ([place])   Complete heart block ([place])   Cardiac arrest [facility])   UTI due to Klebsiella species   Brief Hospital Course (including significant findings, care, treatment, and services provided and events leading to death)  [name] is a 87 y.o. year old female who presented with a syncopal episode due to complete heart block. She was intubated for unresponsiveness. She suffered a brief asystolic arrest. A transvenous pacemaker was placed. She woke up and was weakly following commands.  She was frail and had been declining health.  As such, there was a narrow window of opportunity to return the patient to her prior baseline level of function. After long discussion with family, they understood that prolonged mechanical ventilatory support would likely work against her and lead to further frailty.  Extubation was attempted but the patient did not tolerate and she was transitioned to comfort care.    Pertinent Labs and Studies  Significant Diagnostic Studies DG Chest 1 View  Result Date: 03/02/2021 CLINICAL DATA:  Central line placement. EXAM: CHEST  1 VIEW COMPARISON:  Same day. FINDINGS: Stable position of endotracheal and nasogastric tubes. Interval placement of left internal jugular catheter with distal tip in  expected position of the SVC. Pneumothorax is noted. IMPRESSION: Interval placement of left internal jugular catheter with distal tip in expected position of the SVC. Electronically Signed   By: Marijo Conception M.D.   On: 03/10/2021 11:32   DG Abd 1 View  Result Date: 03/02/2021 CLINICAL DATA:  OG tube placement. EXAM: ABDOMEN - 1 VIEW COMPARISON:  03/31/2012 FINDINGS: An enteric tube is present with tip overlying the left upper quadrant in the expected region of the proximal gastric body and with the side hole also below the diaphragm. No dilated loops of bowel are seen in the included upper portion of the abdomen. The lungs and mediastinum more better evaluated on today's earlier chest radiograph. IMPRESSION: Enteric tube placement as above. Electronically Signed   By: Logan Bores M.D.   On: 02/22/2021 13:36   CT Head Wo Contrast  Result Date: 03-10-21 CLINICAL DATA:  Mental status change of unknown cause.  Seizures. EXAM: CT HEAD WITHOUT CONTRAST TECHNIQUE: Contiguous axial images were obtained from the base of the skull through the vertex without intravenous contrast. COMPARISON:  11/29/2014 FINDINGS: Brain: Prominent diffuse cerebral atrophy. Ventricular dilatation consistent with central atrophy. Low-attenuation changes in the deep white matter consistent with small vessel ischemia. Old right cerebellar infarcts. Old lacunar infarcts in the left internal capsule, progressed since prior study. No mass effect or midline shift. No abnormal extra-axial fluid collections. Gray-white matter junctions are distinct. Basal cisterns are not effaced. No acute intracranial hemorrhage. Vascular: Moderate intracranial arterial vascular calcifications. Skull: Calvarium appears intact. Sinuses/Orbits: Paranasal sinuses and mastoid air cells are clear. Other: None. IMPRESSION: No acute intracranial  abnormalities. Chronic atrophy and small vessel ischemic changes. Old infarcts as discussed. Electronically Signed    By: Lucienne Capers M.D.   On: 01/23/2021 21:47   CARDIAC CATHETERIZATION  Result Date: 03/21/2021 Procedure performed: Ultrasound-guided access of the right common femoral vein, placement of a 6 French long sheath followed by placement of a balloontipped temporary transvenous pacemaker under fluoroscopy guidance. No procedural complications.  Ventricular capture obtained at 0.5 MV.  Catheter tip at 58 cm, settings included demand 50 bpm at output of 5 MV. Access site and pacemaker were carefully sutured in place and secured.  Discussed with family.   DG Chest Port 1 View  Result Date: 03/07/2021 CLINICAL DATA:  Endotracheal tube and nasogastric tube placement. EXAM: PORTABLE CHEST 1 VIEW COMPARISON:  February 20, 2021 FINDINGS: The heart size and mediastinal contours are stable. Endotracheal tube is identified distal tip 5 cm from carina. Nasogastric tube is identified with distal tip in the proximal stomach. Mild increased pulmonary interstitium is identified in the bilateral upper lobes. There is no pleural effusion or focal pneumonia. There is no pneumothorax. The visualized skeletal structures are unremarkable. IMPRESSION: Endotracheal tube identified distal tip 5 cm from carina. Nasogastric tube distal tip in the proximal stomach. No pneumothorax. Mild increased pulmonary interstitium is identified in bilateral upper lobes. This can be seen in mild edema. Electronically Signed   By: Abelardo Diesel M.D.   On: 03/02/2021 10:32   DG Chest Port 1 View  Result Date: 02/19/2021 CLINICAL DATA:  Seizure. History of dementia, hypertension and diabetes. EXAM: PORTABLE CHEST 1 VIEW COMPARISON:  07/07/2019 FINDINGS: Cardiac silhouette is normal in size. No mediastinal or hilar masses. Minor linear scar atelectasis at the left lung base, stable. Lungs otherwise clear. No pleural effusion or pneumothorax. Skeletal structures are grossly intact. IMPRESSION: No active disease. Electronically Signed   By: Lajean Manes M.D.   On: 01/23/2021 18:40   DG Abd Portable 1V  Result Date: 02/25/2021 CLINICAL DATA:  Enteric tube placement. EXAM: PORTABLE ABDOMEN - 1 VIEW COMPARISON:  Radiograph dated 03/17/2021 FINDINGS: Enteric tube with tip in the proximal stomach.  No bowel dilatation. A right lower extremity venous access line extends up along the right side of the spine and crosses the midline at the level of the diaphragm. The tip is located to the left of the spine in the left upper abdomen, possibly within the left renal vein. Clinical correlation is recommended. Bibasilar atelectasis. IMPRESSION: 1. Enteric tube with tip in the proximal stomach. 2. Right lower extremity venous access line with tip projecting over the left upper abdomen, possibly in the left renal vein. Electronically Signed   By: Anner Crete M.D.   On: 02/25/2021 22:04   EEG adult  Result Date: 03/15/2021 Lora Havens, MD     03/07/2021 12:01 PM Patient Name: SHARONANN MALBROUGH MRN: [id number] Epilepsy Attending: Lora Havens Referring Physician/Provider: Dr Ina Homes Date: 03/11/2021 Duration: 30.43 mins  Patient history:  87 year old female presenting after a shaking spell at home with semiology suggestive of seizure. EEG to evaluate for seizure.  Level of alertness: Awake  AEDs during EEG study: LTG  Technical aspects: This EEG study was done with scalp electrodes positioned according to the 10-20 International system of electrode placement. Electrical activity was acquired at a sampling rate of 500Hz  and reviewed with a high frequency filter of 70Hz  and a low frequency filter of 1Hz . EEG data were recorded continuously and digitally stored.  Description:  EEG showed continuous generalized 3 to 6 Hz theta-delta slowing. Hyperventilation and photic stimulation were not performed.    ABNORMALITY - Continuous slow, generalized  IMPRESSION: This study is suggestive of moderate diffuse encephalopathy, nonspecific etiology. No seizures or  epileptiform discharges were seen throughout the recording.  Lora Havens   EEG adult  Result Date: 02/22/2021 Lora Havens, MD     02/22/2021  7:55 AM Patient Name: CORINTHIA HELMERS MRN: [id number] Epilepsy Attending: Lora Havens Referring Physician/Provider: Dr Kerney Elbe Date: 02/22/2021 Duration: 21.35 mins Patient history:  87 year old female presenting after a shaking spell at home with semiology suggestive of seizure. EEG to evaluate for seizure. Level of alertness: Awake AEDs during EEG study: LTG Technical aspects: This EEG study was done with scalp electrodes positioned according to the 10-20 International system of electrode placement. Electrical activity was acquired at a sampling rate of 500Hz  and reviewed with a high frequency filter of 70Hz  and a low frequency filter of 1Hz . EEG data were recorded continuously and digitally stored. Description: The posterior dominant rhythm consists of 8 Hz activity of moderate voltage (25-35 uV) seen predominantly in posterior head regions, symmetric and reactive to eye opening and eye closing. EEG showed intermittent generalized 3 to 6 Hz theta-delta slowing. Hyperventilation and photic stimulation were not performed.   ABNORMALITY - Intermittent slow, generalized IMPRESSION: This study is suggestive of mild diffuse encephalopathy, nonspecific etiology. No seizures or epileptiform discharges were seen throughout the recording. Lora Havens   ECHOCARDIOGRAM COMPLETE  Result Date: 02/21/2021    ECHOCARDIOGRAM REPORT   Patient Name:   ROSELINA BURGUENO Yusupov Date of Exam: 02/21/2021 Medical Rec #:  [id number]      Height:       64.0 in Accession #:    [id number]     Weight:       160.0 lb Date of Birth:  20-Apr-1934     BSA:          1.779 m Patient Age:    70 years       BP:           168/79 mmHg Patient Gender: F              HR:           70 bpm. Exam Location:  Inpatient Procedure: 2D Echo, Cardiac Doppler and Color Doppler Indications:    TIA  History:         Patient has no prior history of Echocardiogram examinations.                 TIA; Risk Factors:Hypertension and Sleep Apnea.  Sonographer:    Jyl Heinz Referring Phys: [id number] [place]  1. Left ventricular ejection fraction, by estimation, is 60 to 65%. The left ventricle has normal function. The left ventricle has no regional wall motion abnormalities. There is mild left ventricular hypertrophy. Left ventricular diastolic parameters are consistent with Grade I diastolic dysfunction (impaired relaxation).  2. Right ventricular systolic function is normal. The right ventricular size is normal.  3. The mitral valve is degenerative. Trivial mitral valve regurgitation. No evidence of mitral stenosis. Moderate mitral annular calcification.  4. Calcified AV with small gradient but AVA 2.3 cm2. The aortic valve is normal in structure. There is moderate calcification of the aortic valve. There is moderate thickening of the aortic valve. Aortic valve regurgitation is not visualized. Aortic valve sclerosis/calcification is present, without any evidence of aortic  stenosis.  5. The inferior vena cava is normal in size with greater than 50% respiratory variability, suggesting right atrial pressure of 3 mmHg. FINDINGS  Left Ventricle: Left ventricular ejection fraction, by estimation, is 60 to 65%. The left ventricle has normal function. The left ventricle has no regional wall motion abnormalities. The left ventricular internal cavity size was normal in size. There is  mild left ventricular hypertrophy. Left ventricular diastolic parameters are consistent with Grade I diastolic dysfunction (impaired relaxation). Right Ventricle: The right ventricular size is normal. No increase in right ventricular wall thickness. Right ventricular systolic function is normal. Left Atrium: Left atrial size was normal in size. Right Atrium: Right atrial size was normal in size. Pericardium: There is no evidence of  pericardial effusion. Mitral Valve: The mitral valve is degenerative in appearance. There is mild thickening of the mitral valve leaflet(s). There is mild calcification of the mitral valve leaflet(s). Moderate mitral annular calcification. Trivial mitral valve regurgitation. No evidence of mitral valve stenosis. Tricuspid Valve: The tricuspid valve is normal in structure. Tricuspid valve regurgitation is not demonstrated. No evidence of tricuspid stenosis. Aortic Valve: Calcified AV with small gradient but AVA 2.3 cm2. The aortic valve is normal in structure. There is moderate calcification of the aortic valve. There is moderate thickening of the aortic valve. Aortic valve regurgitation is not visualized. Aortic regurgitation PHT measures 500 msec. Aortic valve sclerosis/calcification is present, without any evidence of aortic stenosis. Aortic valve mean gradient measures 10.0 mmHg. Aortic valve peak gradient measures 14.7 mmHg. Aortic valve area, by VTI measures 2.16 cm. Pulmonic Valve: The pulmonic valve was normal in structure. Pulmonic valve regurgitation is not visualized. No evidence of pulmonic stenosis. Aorta: The aortic root is normal in size and structure. Venous: The inferior vena cava is normal in size with greater than 50% respiratory variability, suggesting right atrial pressure of 3 mmHg. IAS/Shunts: No atrial level shunt detected by color flow Doppler.  LEFT VENTRICLE PLAX 2D LVIDd:         4.10 cm     Diastology LVIDs:         2.80 cm     LV e' medial:    6.74 cm/s LV PW:         1.00 cm     LV E/e' medial:  12.7 LV IVS:        1.30 cm     LV e' lateral:   7.40 cm/s LVOT diam:     2.00 cm     LV E/e' lateral: 11.6 LV SV:         101 LV SV Index:   57 LVOT Area:     3.14 cm  LV Volumes (MOD) LV vol d, MOD A2C: 86.9 ml LV vol d, MOD A4C: 91.4 ml LV vol s, MOD A2C: 36.1 ml LV vol s, MOD A4C: 35.3 ml LV SV MOD A2C:     50.8 ml LV SV MOD A4C:     91.4 ml LV SV MOD BP:      56.2 ml RIGHT VENTRICLE             IVC RV Basal diam:  3.60 cm    IVC diam: 1.20 cm RV Mid diam:    3.30 cm RV S prime:     9.90 cm/s TAPSE (M-mode): 1.9 cm LEFT ATRIUM             Index        RIGHT ATRIUM  Index LA diam:        3.20 cm 1.80 cm/m   RA Area:     13.70 cm LA Vol (A2C):   48.3 ml 27.14 ml/m  RA Volume:   32.40 ml  18.21 ml/m LA Vol (A4C):   49.1 ml 27.59 ml/m LA Biplane Vol: 52.2 ml 29.34 ml/m  AORTIC VALVE AV Area (Vmax):    2.40 cm AV Area (Vmean):   2.29 cm AV Area (VTI):     2.16 cm AV Vmax:           191.50 cm/s AV Vmean:          154.000 cm/s AV VTI:            0.466 m AV Peak Grad:      14.7 mmHg AV Mean Grad:      10.0 mmHg LVOT Vmax:         146.00 cm/s LVOT Vmean:        112.500 cm/s LVOT VTI:          0.321 m LVOT/AV VTI ratio: 0.69 AI PHT:            500 msec  AORTA Ao Root diam: 2.90 cm Ao Asc diam:  3.30 cm MITRAL VALVE                TRICUSPID VALVE MV Area (PHT): 4.86 cm     TR Peak grad:   41.0 mmHg MV Decel Time: 156 msec     TR Vmax:        320.00 cm/s MV E velocity: 85.90 cm/s MV A velocity: 147.00 cm/s  SHUNTS MV E/A ratio:  0.58         Systemic VTI:  0.32 m                             Systemic Diam: 2.00 cm Jenkins Rouge MD Electronically signed by Jenkins Rouge MD Signature Date/Time: 02/21/2021/11:19:06 AM    Final    VAS US CAROTID (at [facility] and [facility] only)  Result Date: 02/21/2021 Carotid Arterial Duplex Study Patient Name:  CAMBREE HENDRIX Rowley  Date of Exam:   02/21/2021 Medical Rec #: [id number]       Accession #:    [id number] Date of Birth: [birth year]      Patient Gender: F Patient Age:   57 years Exam Location:  [facility] Procedure:      VAS US CAROTID Referring Phys: Harrold Donath --------------------------------------------------------------------------------  Indications:       TIA. Risk Factors:      Hypertension, hyperlipidemia, Diabetes, prior CVA. Comparison Study:  no prior Performing Technologist: Archie Patten RVS  Examination Guidelines: A complete evaluation  includes B-mode imaging, spectral Doppler, color Doppler, and power Doppler as needed of all accessible portions of each vessel. Bilateral testing is considered an integral part of a complete examination. Limited examinations for reoccurring indications may be performed as noted.  Right Carotid Findings: +----------+--------+--------+--------+-------------------------+--------+             PSV cm/s EDV cm/s Stenosis Plaque Description        Comments  +----------+--------+--------+--------+-------------------------+--------+  CCA Prox   92       17                heterogenous                        +----------+--------+--------+--------+-------------------------+--------+  CCA Distal 84       12                heterogenous                        +----------+--------+--------+--------+-------------------------+--------+  ICA Prox   93       23       1-39%    heterogenous and calcific           +----------+--------+--------+--------+-------------------------+--------+  ICA Distal 61       16                                                    +----------+--------+--------+--------+-------------------------+--------+  ECA        83       7                                                     +----------+--------+--------+--------+-------------------------+--------+ +----------+--------+-------+--------+-------------------+             PSV cm/s EDV cms Describe Arm Pressure (mmHG)  +----------+--------+-------+--------+-------------------+  Subclavian 99                                             +----------+--------+-------+--------+-------------------+ +---------+--------+--+--------+--+---------+  Vertebral PSV cm/s 89 EDV cm/s 15 Antegrade  +---------+--------+--+--------+--+---------+  Left Carotid Findings: +----------+--------+--------+--------+---------------------+------------------+             PSV cm/s EDV cm/s Stenosis Plaque Description    Comments             +----------+--------+--------+--------+---------------------+------------------+  CCA Prox   110      13                heterogenous                              +----------+--------+--------+--------+---------------------+------------------+  CCA Distal 73       13                heterogenous                              +----------+--------+--------+--------+---------------------+------------------+  ICA Prox   61       15       1-39%    heterogenous and      shadowing,                                                 calcific              tortuous            +----------+--------+--------+--------+---------------------+------------------+  ICA Distal 75       17                                                          +----------+--------+--------+--------+---------------------+------------------+  ECA        170                                                                  +----------+--------+--------+--------+---------------------+------------------+ +----------+--------+--------+--------+-------------------+             PSV cm/s EDV cm/s Describe Arm Pressure (mmHG)  +----------+--------+--------+--------+-------------------+  Subclavian 33                                              +----------+--------+--------+--------+-------------------+ +---------+--------+--+--------+----------+  Vertebral PSV cm/s 95 EDV cm/s Retrograde  +---------+--------+--+--------+----------+   Summary: Right Carotid: Velocities in the right ICA are consistent with a 1-39% stenosis. Left Carotid: Velocities in the left ICA are consistent with a 1-39% stenosis. Vertebrals: Right vertebral artery demonstrates antegrade flow. Left vertebral             artery demonstrates retrograde flow. *See table(s) above for measurements and observations.  Electronically signed by Monica Martinez MD on 02/21/2021 at 11:35:28 AM.    Final     Microbiology No results found for this or any previous visit (from the past 240  hour(s)).  Lab Basic Metabolic Panel: No results for input(s): NA, K, CL, CO2, GLUCOSE, BUN, CREATININE, CALCIUM, MG, PHOS in the last 168 hours. Liver Function Tests: No results for input(s): AST, ALT, ALKPHOS, BILITOT, PROT, ALBUMIN in the last 168 hours. No results for input(s): LIPASE, AMYLASE in the last 168 hours. No results for input(s): AMMONIA in the last 168 hours. CBC: No results for input(s): WBC, NEUTROABS, HGB, HCT, MCV, PLT in the last 168 hours. Cardiac Enzymes: No results for input(s): CKTOTAL, CKMB, CKMBINDEX, TROPONINI in the last 168 hours. Sepsis Labs: No results for input(s): PROCALCITON, WBC, LATICACIDVEN in the last 168 hours.  Procedures/Operations  Mechanical ventilation, temporary transvenous pacemaker insertion.    Norabelle Kondo 03/11/2021, 10:01 AM

## 2021-03-25 NOTE — Progress Notes (Signed)
Chaplains Emogene Morgan and Wynetta Emery responded to page requesting family support at end of life of pt.  Chaplains offered ministry of presence to pt's son who told the story of being his mother's primary caretaker while encouraging her to go on to be with her husband who had died 33 years ago.  Chaplains also ministered to granddaughter at bedside and to pt's daughter as she came and went multiple times.  Chaplains offered ministry of care and concern to additional family members gathered in [facility] Consult room,  providing beverages and support to those family members.  Chaplains stand available for additional support if needed.    [place]

## 2021-03-25 NOTE — Progress Notes (Signed)
Manufacturing engineer [facility]) Community Based Palliative Care     This patient is enrolled in our palliative care services in the community.  [facility] will continue to follow for any discharge planning needs and to coordinate continuation of palliative care.  If you have questions or need assistance, please call [phone] or contact the hospital Liaison listed on AMION.      Farrel Gordon, RN, [facility] Liaison

## 2021-03-25 NOTE — Progress Notes (Signed)
Patient remains intubated, being weaned off ventilator, sedation being turned off.  Vitals:   03-17-2021 1500 03/17/2021 1546  BP: (!) 121/51   Pulse: (!) 102   Resp: (!) 23   Temp:  98.2 F (36.8 C)  SpO2: 98%    Cardiac: S1-S2 is normal no murmur. Chest: Clear. Abdomen: Soft, obese, bowel sounds heard.  1.  Complete heart block and ventricular standstill I checked the pacemaker, she has got 100% capture at MA of 0.5 V.  Temporary pacemaker placed back at demand 40 bpm, output 5 V.  Discussed with patient's daughter at the bedside.  If patient gets extubated and recovers her mental status, she will be set up for permanent pacemaker implantation probably Monday.   [name], MD, [facility] 03/17/21, 5:24 PM Office: [phone] Fax: [phone] Pager: [phone]

## 2021-03-25 NOTE — Procedures (Signed)
Extubation Procedure Note  Patient Details:   Name: [name] DOB: October 03, 1934 MRN: [id number]   Airway Documentation:    Vent end date: March 17, 2021 Vent end time: 1100   Evaluation  O2 sats: stable throughout Complications: No apparent complications Patient did tolerate procedure well. Bilateral Breath Sounds: Clear, Diminished   Pt extubated to 4L [name] per MD order. Pt had positive cuff leak prior to extubation. No stridor noted.   Vilinda Blanks Mar 17, 2021, 11:00 AM

## 2021-03-25 NOTE — Progress Notes (Signed)
PT Cancellation Note  Patient Details Name: [name] MRN: [id number] DOB: 12/19/1934   Cancelled Treatment:    Reason Eval/Treat Not Completed: Medical issues which prohibited therapy at this time. Per discussion with RN, pt remains intubated and sedated, recommended therapies check back at later date. Will continue to follow and re-evaluate as appropriate.   [place] Carbo, PT, DPT   Acute Rehabilitation Department Pager #: [phone]   [name] 03-14-21, 10:49 AM

## 2021-03-25 NOTE — Plan of Care (Signed)

## 2021-03-25 NOTE — Telephone Encounter (Signed)
° °  Telephone encounter was:  Successful.  07-Mar-2021 Name: REBEKKA LOBELLO MRN: [id number] DOB: September 15, 1934  STEVIE ERTLE is a 87 y.o. year old female who is a primary care patient of Shon Baton, MD . The community resource team was consulted for assistance with food stamps, low income energy assistance and Medicaid  Care guide performed the following interventions: Spoke with patient's son Dellis Filbert, he has received the information emailed for assistance with food stamps, low income energy assistance and Medicaid. The patient is currently admitted and Dellis Filbert has not had time to review information. He has my name and contact information and will contact me if he needs further assistance.    Follow Up Plan:  No further follow up planned at this time. The patient has been provided with needed resources.  Chaitra Mast, AAS Paralegal, [facility]  [address], [place] [address] ??[email or web address]   ?? [phone]   [email or web address]

## 2021-03-25 NOTE — Progress Notes (Signed)
Unable to discover model number of implanted device prior to MRI. There are some serial numbers of this interStim device that are incompatible with MRI, but I was unable to synch the controller with the device to determine which Ms. Sposito has using the instructions in the link below. We can call customer service during normal business hours at [phone] or ask if the pt's family has her implant card available.   [email or web address]  Model 3023 neurostimulator only: Do not conduct an MRI scan if the serial number is ineligible for MRI. To avoid increased risk of neurostimulator damage, patients with the following serial numbers should not have MRI scans: ? Less than [id number] H ? Between [id number] H and [id number] H ? Between [id number] Chauncey Cruel and [id number] S

## 2021-03-25 NOTE — Progress Notes (Signed)
NAME:  [name], MRN:  [id number], DOB:  1934-04-09, LOS: 5 ADMISSION DATE:  02/17/2021, CONSULTATION DATE:  03/23/2021 REFERRING MD:  TRH, CHIEF COMPLAINT:  syncope   History of Present Illness:  87 year old woman with hx of hypothyroidism, prior CVA, dementia who presented with syncopal episode with possible seizure like activity at home on 02/19/2021.  Workup so far has revealed grade 1 diastolic dysfunction, no evidence of ongoing seizures, possible UTI.  Was being worked up for SNF placement but this AM became more somnolent then unresponsive.  Son at bedside reversed DNR status.  PEA arrest and given CPR, brief, woke back up but not protecting airway, gurgling respirations.  Intubated by anesthesia.  Pertinent  Medical History  GERD Dementia Prior cerebellar infarct DM2 PMR HTN Hypothyroidism PUD  Significant Hospital Events: Including procedures, antibiotic start and stop dates in addition to other pertinent events   02/17/2021 admitted 02/26/2021 code and temporary PM placed 03/02/2021 SVT s/p adenosine x 2. Increased levop support. After this Code status changed to DNR (no chest compressions/shocks) per family 1/5 No acute events overnight, will open eyes to verbal stimuli but sluggish   Interim History / Subjective:  Increasing agitation today.  Has been following commands consistently  Objective   Blood pressure (!) 165/129, pulse (!) 107, temperature (!) 96.8 F (36 C), temperature source Axillary, resp. rate (!) 41, height 5\' 4"  (1.626 m), weight 81.6 kg, SpO2 95 %.    Vent Mode: PSV;CPAP FiO2 (%):  [30 %] 30 % Set Rate:  [22 bmp] 22 bmp Vt Set:  [430 mL] 430 mL PEEP:  [5 cmH20] 5 cmH20 Pressure Support:  [5 cmH20] 5 cmH20 Plateau Pressure:  [12 cmH20-13 cmH20] 13 cmH20   Intake/Output Summary (Last 24 hours) at March 23, 2021 1323 Last data filed at 2021/03/23 0700 Gross per 24 hour  Intake 1167.18 ml  Output 360 ml  Net 807.18 ml    Filed Weights   02/25/21 0500  02/26/21 0500 03/23/2021 0500  Weight: 79.7 kg 81 kg 81.6 kg   Physical Exam: General: Acute on chronically ill appearing elderly female lying in bed  on mechanical ventilation, in NAD HEENT: ETT, MM pink/moist, PERRL,  Neuro: Will open eyes to verbal stimuli, follows commands on right side.   CV: s1s2 regular rate and rhythm, no murmur, rubs, or gallops,  PULM:  Clear to ascultation, no increased work for breathing, no added breath sounds GI: soft, bowel sounds active in all 4 quadrants, non-tender, non-distended, Extremities: warm/dry, no edema  Skin: no rashes or lesions  Ancillary tests personally reviewed:    Assessment & Plan:  In hospital cardiac arrest secondary to CHB s/p temp PM 1/2  Acute encephalopathy secondary to cardiac arrest  Acute hypoxemic respiratory failure  Klebsiella UTI Multiple comorbidities include GERD, PMR, CVA, dementia, gait abnormality Prior DNR  Plan:  -Patient is now following commands and has tolerated SBT.  She is frail and has been declining health.  As such, there is a narrow window of opportunity to return the patient to her prior baseline level of function. -Long discussion with family, they understand that prolonged mechanical ventilatory support would likely work against her and lead to further frailty. -I have told them that I believe that she is in the best condition possible to attempt extubation and that waiting a further 24 hours may not improve her outcome.  They agree and have made it clear that they do not want her reintubated if she fails  but rather transitioned to comfort care.  Best Practice (right click and "Reselect all SmartList Selections" daily)   Diet/type: tubefeeds DVT prophylaxis: LMWH GI prophylaxis: pepcid Lines: N/A Foley:  Yes, and it is still needed Code Status:  limited No chest compressions or shocks Last date of multidisciplinary goals of care discussion [pending]  CRITICAL CARE Performed by: Kipp Brood  Total critical care time: 40 minutes  Critical care time was exclusive of separately billable procedures and treating other patients.  Critical care was necessary to treat or prevent imminent or life-threatening deterioration.  Critical care was time spent personally by me on the following activities: development of treatment plan with patient and/or surrogate as well as nursing, discussions with consultants, evaluation of patient's response to treatment, examination of patient, obtaining history from patient or surrogate, ordering and performing treatments and interventions, ordering and review of laboratory studies, ordering and review of radiographic studies, pulse oximetry and re-evaluation of patient's condition.  Kipp Brood, MD [facility] ICU Physician [place]  Pager: [phone] Or Epic Secure Chat After hours: [phone].  03-05-2021, 1:28 PM     March 05, 2021, 1:23 PM

## 2021-03-25 NOTE — TOC Progression Note (Signed)
Transition of Care [facility]) - Progression Note    Patient Details  Name: [name] MRN: [id number] Date of Birth: [birth year]  Transition of Care [facility]) CM/SW [place], [place] Phone Number: 2021/03/17, 4:29 PM  Clinical Narrative:     CSW continues to follow patient. CSW will follow up with family on snf choice when appropriate..CSW will continue to follow and assist with patients dc planning needs.  Expected Discharge Plan: [place] Barriers to Discharge: Continued Medical Work up, SNF Pending bed offer  Expected Discharge Plan and Services Expected Discharge Plan: [place] Choice: [place] arrangements for the past 2 months: Single Family Home                                       Social Determinants of Health (SDOH) Interventions    Readmission Risk Interventions No flowsheet data found.

## 2021-03-25 NOTE — Progress Notes (Signed)
Patient comfort care upon this RN's arrival to shift. Family and hospital chaplin present at patient's bedside. Patient expired at 54. Pronounced by this RN and Erma Heritage RN. MD notified.

## 2021-03-25 NOTE — Progress Notes (Signed)
Nasal trumpet placed in pts left nare to assit with nasotracheal suctioning. Pt tolerated well.

## 2021-03-25 NOTE — Progress Notes (Signed)
Patient desaturated 89-87, notified CCM, patients code transitioned to comfort care. Patients next of kin notified.

## 2021-03-25 DEATH — deceased
